# Patient Record
Sex: Male | Born: 1940 | Race: White | Hispanic: No | Marital: Married | Smoking: Former smoker
Health system: Southern US, Community
[De-identification: ages and names within clinical notes are randomized; demographics above are authoritative.]

## PROBLEM LIST (undated history)

## (undated) DIAGNOSIS — I4891 Unspecified atrial fibrillation: Secondary | ICD-10-CM

## (undated) DIAGNOSIS — E119 Type 2 diabetes mellitus without complications: Secondary | ICD-10-CM

## (undated) DIAGNOSIS — I10 Essential (primary) hypertension: Secondary | ICD-10-CM

## (undated) HISTORY — PX: JOINT REPLACEMENT: SHX530

## (undated) HISTORY — PX: SPINE SURGERY: SHX786

## (undated) HISTORY — PX: OTHER SURGICAL HISTORY: SHX169

## (undated) HISTORY — DX: Essential (primary) hypertension: I10

## (undated) HISTORY — DX: Type 2 diabetes mellitus without complications: E11.9

## (undated) HISTORY — PX: BACK SURGERY: SHX140

---

## 2005-12-05 ENCOUNTER — Ambulatory Visit: Payer: Self-pay | Admitting: Unknown Physician Specialty

## 2009-05-08 ENCOUNTER — Emergency Department: Payer: Self-pay | Admitting: Unknown Physician Specialty

## 2011-07-07 DIAGNOSIS — E119 Type 2 diabetes mellitus without complications: Secondary | ICD-10-CM | POA: Insufficient documentation

## 2012-09-03 ENCOUNTER — Encounter: Payer: Self-pay | Admitting: Internal Medicine

## 2012-09-24 ENCOUNTER — Encounter: Payer: Self-pay | Admitting: Orthopedic Surgery

## 2012-10-03 ENCOUNTER — Encounter: Payer: Self-pay | Admitting: Orthopedic Surgery

## 2012-10-03 ENCOUNTER — Encounter: Payer: Self-pay | Admitting: Internal Medicine

## 2012-10-05 DIAGNOSIS — L0390 Cellulitis, unspecified: Secondary | ICD-10-CM | POA: Insufficient documentation

## 2013-10-29 ENCOUNTER — Ambulatory Visit: Payer: Self-pay | Admitting: Physician Assistant

## 2014-06-26 DIAGNOSIS — R600 Localized edema: Secondary | ICD-10-CM | POA: Insufficient documentation

## 2014-09-17 ENCOUNTER — Emergency Department: Payer: Self-pay | Admitting: Emergency Medicine

## 2015-07-20 ENCOUNTER — Other Ambulatory Visit: Payer: Self-pay | Admitting: Family Medicine

## 2015-07-20 DIAGNOSIS — M25561 Pain in right knee: Secondary | ICD-10-CM

## 2015-07-21 ENCOUNTER — Ambulatory Visit
Admission: RE | Admit: 2015-07-21 | Discharge: 2015-07-21 | Disposition: A | Discharge status: Home / Self Care | Payer: Medicare Other | Source: Ambulatory Visit | Attending: Family Medicine | Admitting: Family Medicine

## 2015-07-21 DIAGNOSIS — S72421A Displaced fracture of lateral condyle of right femur, initial encounter for closed fracture: Secondary | ICD-10-CM | POA: Diagnosis not present

## 2015-07-21 DIAGNOSIS — X58XXXA Exposure to other specified factors, initial encounter: Secondary | ICD-10-CM | POA: Insufficient documentation

## 2015-07-21 DIAGNOSIS — M25561 Pain in right knee: Secondary | ICD-10-CM

## 2015-07-24 ENCOUNTER — Ambulatory Visit: Payer: Medicare Other

## 2015-07-31 DIAGNOSIS — Z471 Aftercare following joint replacement surgery: Secondary | ICD-10-CM | POA: Insufficient documentation

## 2015-07-31 DIAGNOSIS — S72426A Nondisplaced fracture of lateral condyle of unspecified femur, initial encounter for closed fracture: Secondary | ICD-10-CM | POA: Insufficient documentation

## 2015-09-09 DIAGNOSIS — M25561 Pain in right knee: Secondary | ICD-10-CM | POA: Insufficient documentation

## 2015-10-14 DIAGNOSIS — M1711 Unilateral primary osteoarthritis, right knee: Secondary | ICD-10-CM | POA: Insufficient documentation

## 2016-01-04 ENCOUNTER — Ambulatory Visit: Payer: Medicare Other | Attending: Orthopedic Surgery | Admitting: Physical Therapy

## 2016-01-04 DIAGNOSIS — R2689 Other abnormalities of gait and mobility: Secondary | ICD-10-CM | POA: Diagnosis present

## 2016-01-04 DIAGNOSIS — R269 Unspecified abnormalities of gait and mobility: Secondary | ICD-10-CM

## 2016-01-04 DIAGNOSIS — M6281 Muscle weakness (generalized): Secondary | ICD-10-CM | POA: Diagnosis present

## 2016-01-04 DIAGNOSIS — M25661 Stiffness of right knee, not elsewhere classified: Secondary | ICD-10-CM | POA: Insufficient documentation

## 2016-01-04 DIAGNOSIS — M25561 Pain in right knee: Secondary | ICD-10-CM | POA: Diagnosis present

## 2016-01-04 NOTE — Therapy (Signed)
[facility] [address] [place], [place], [address] Phone: [phone]   Fax:  [phone]  Physical Therapy Evaluation  Patient Details  Name: Ian King MRN: [id number] Date of Birth: 02/05/1941 Referring Provider: Dorothey BasemanBronstein, David  Encounter Date: 01/04/2016      PT End of Session - 01/04/16 1456    Visit Number 1   Number of Visits 8   Date for PT Re-Evaluation 02/01/16   Authorization - Visit Number 1   Authorization - Number of Visits 10   PT Start Time 1300   PT Stop Time 1409   PT Time Calculation (min) 69 min   Equipment Utilized During Treatment Gait belt   Activity Tolerance Patient tolerated treatment well;No increased pain   Behavior During Therapy [facility] for tasks assessed/performed      No past medical history on file.  No past surgical history on file.  There were no vitals filed for this visit.  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  Pt reports being placed on ABx early on after his surgery d/t a potential infection.  Pt stayed in a nursing home and received therapy at [facility] which included walking and exercising on a NuStep.  Pt believes he has no limitations with walking using a FWW.  Pt states that prior to his surgery he was ambulating with a combination of his FWW and a hurry cane.  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  Pt enjoys gardening, and mowing on a riding lawn mower in his freetime.  Pt's wife was present for evaluation and expressed concern about the swelling in his R LE.  Pt reports taking 2 tylenol prior to therapy.    Patient is accompained by: Family member   Limitations Standing;Walking;House hold activities;Other (comment)   Patient Stated Goals To regain  function of his R knee.    Currently in Pain? Yes   Pain Score 0-No pain   Pain Location Knee   Pain Orientation Right   Pain Type Surgical pain   Pain Onset 1 to 4 weeks ago            [facility] Assessment - 01/04/16 0001    Assessment   Medical Diagnosis R TKA   Referring Provider Dorothey BasemanBronstein, David   Onset Date/Surgical Date 12/03/15   Prior Therapy Yes   Balance Screen   Has the patient fallen in the past 6 months Yes   How many times? 1      OBJECTIVE: Ther Ex: Performed exercises within HEP including 2x30 sec standing step knee extension with overpressure, 1x15 standing B hip flexion, 8x quad sets with 10 second hold, 10x supine SLR, and 15-20x sit to/from stands.      Pt requires skilled PT services to increase knee ROM, improve knee strength, and improve balance.  Pt tolerated therapy treatment well as evidenced by participating actively throughout therapy and agreeing to complete his HEP.        PT Education - 01/04/16 1452    Education provided Yes   Education Details Educated on swelling, and not picking at his incision scabs.  Educated on HEP exercises, and increasing his knee extension at home.     Person(s)  Educated Patient;Spouse   Methods Explanation;Tactile cues;Demonstration;Verbal cues;Handout   Comprehension Verbalized understanding;Returned demonstration;Verbal cues required;Tactile cues required             PT Long Term Goals - 01/04/16 1511    PT LONG TERM GOAL #1   Title Pt will demonstrate R knee AROM at 5-120 deg. measured with goniometer to increase pt's ROM for function.    Baseline 19-113 on 01/04/2016   Time 4   Period Weeks   Status New   PT LONG TERM GOAL #2   Title Pt will demonstrate R hip/knee strength with MMT at 4+/5 to improve pt's LE strength.    Baseline Hip flex 4-/5, knee ext 3+/5, knee flex 4+/5 on 01/04/2016   Time 4   Period Weeks   Status New   PT LONG TERM GOAL #3   Title Pt will demonstrate 10 R SLR without rest  breaks with his leg maintaining a position of knee ext to improve pt's safety in preparation to increase ambulation with a hurry cane.    Baseline Pt is unable to complete SLR d/t 24 degree extension lag on 01/04/2016   Time 4   Period Weeks   Status New   PT LONG TERM GOAL #4   Title Pt will report 45/80 on LEFS self-percieved questionnaire to improve pt's quality of life.   Baseline 24/80 on 01/04/2016   Time 4   Period Weeks   Status New               Plan - 01/04/16 1457    Clinical Impression Statement Pt is a 75 y.o. male who presents to the clinic after a R TKA on 12/03/2015.  Pt ambulated into the clinic with wife using a FWW.  Pt demonstrates decreased R knee motion (Pre-treatment AROM: 20-113; pretreatment PROM 116 knee flex; post treatment PROM knee flex 116; post treatment AROM 19-113).  Pt presents with decreased B knee strength, with greater deficits on the R LE (R: hip flex 4-/5, knee ext 3+/5, knee flex 4+/5; L: hip flex 4/5, knee ext 4+/5, knee flex 4+/5).  Pt's seated AROM of R knee is 24-103 deg.  Pt has decreased patellar mobility in all directions on L knee with clicking, but pt does not c/o pain.  Pt has edema present in his R LE (R: at patella 18.5 in, 7 in superior to patella 21.5 in, 7 in inferior to patella 16 in; L: at patella 18 in, 7 in superior to patella 20 in, 7 in inferior to patella 14.5 in).  Pt demonstrates impaired balance with a positive modified rhomberg B.  Pt's scar presents with minimal redness in superior portion of the incision.  Pt. will benefit from skilled PT services to increase knee ROM/ strength to improve pain-free mobility without assistive device.     Pt will benefit from skilled therapeutic intervention in order to improve on the following deficits Abnormal gait;Decreased activity tolerance;Decreased balance;Decreased endurance;Decreased mobility;Decreased range of motion;Difficulty walking;Decreased strength;Decreased scar  mobility;Decreased skin integrity;Hypomobility;Increased edema;Impaired flexibility   Rehab Potential Good   Clinical Impairments Affecting Rehab Potential previous infection postopperatively   PT Frequency 4x / week   PT Duration 4 weeks   PT Treatment/Interventions Cryotherapy;Gait training;Neuromuscular re-education;Balance training;Therapeutic exercise;Therapeutic activities;Functional mobility training;Patient/family education;Manual techniques;Scar mobilization;Passive range of motion   PT Next Visit Plan Introduce manual therapy treatment to increase pt's knee extension, and begin strengthening exercises. Review HEP.   PT Home Exercise Plan See handout.    Consulted  and Agree with Plan of Care Patient          G-Codes - 01/28/16 02-26-2039    Functional Assessment Tool Used clinical impression/ LEFS/ pain/ ROM/ gait difficulty/ muscle weakness   Functional Limitation Mobility: Walking and moving around   Mobility: Walking and Moving Around Current Status [phone]) At least 40 percent but less than 60 percent impaired, limited or restricted   Mobility: Walking and Moving Around Goal Status [phone]) At least 1 percent but less than 20 percent impaired, limited or restricted       Problem List There are no active problems to display for this patient.  Cammie Mcgee, PT, DPT # [id number] [name], SPT  2016/01/28, 8:41 PM  [facility] [address] [place], [place], [address] Phone: [phone]   Fax:  [phone]  Name: Ian King MRN: [id number] Date of Birth: 05/11/1941

## 2016-01-07 ENCOUNTER — Ambulatory Visit: Payer: Medicare Other | Admitting: Physical Therapy

## 2016-01-07 ENCOUNTER — Encounter: Payer: Medicare Other | Admitting: Physical Therapy

## 2016-01-07 DIAGNOSIS — M25661 Stiffness of right knee, not elsewhere classified: Secondary | ICD-10-CM

## 2016-01-07 DIAGNOSIS — R269 Unspecified abnormalities of gait and mobility: Secondary | ICD-10-CM

## 2016-01-07 DIAGNOSIS — M6281 Muscle weakness (generalized): Secondary | ICD-10-CM

## 2016-01-07 DIAGNOSIS — M25561 Pain in right knee: Secondary | ICD-10-CM

## 2016-01-08 NOTE — Therapy (Signed)
[facility] [address] [place], [place], [address] Phone: [phone]   Fax:  [phone]  Physical Therapy Treatment  Patient Details  Name: Neomia GlassLarry L Warn MRN: [id number] Date of Birth: 03/26/1941 Referring Provider: Dorothey BasemanBronstein, David  Encounter Date: 01/07/2016      PT End of Session - 01/08/16 1848    Visit Number 2   Number of Visits 8   Date for PT Re-Evaluation 02/01/16   Authorization - Visit Number 2   Authorization - Number of Visits 10   PT Start Time 1317   PT Stop Time 1409   PT Time Calculation (min) 52 min   Activity Tolerance Patient limited by pain;Patient tolerated treatment well   Behavior During Therapy [facility] for tasks assessed/performed      No past medical history on file.  No past surgical history on file.  There were no vitals filed for this visit.      Subjective Assessment - 01/08/16 1846    Subjective Pt. states he was really sore/ hurting in R knee after doing prolonged knee extension stretch with wt. during intial PT visit.  Pt. states he as been unable to get comfortable/ sleep and has use polar ice machine frequently.     Patient is accompained by: Family member   Limitations Standing;Walking;House hold activities;Other (comment)   Patient Stated Goals To regain function of his R knee.    Currently in Pain? Yes   Pain Score 6    Pain Location Knee   Pain Orientation Right        OBJECTIVE:  There.ex.: Nustep L7 10 min. B UE/LE (focusing on increase knee extension for initial 8 min. And knee flexion for final 2 min.)- warm-up/no charge.  Standing/walking hip flexion with min. To no UE assist 10x in //-bars (mirror feedback).  Lateral walking with increase hip/knee flexion and cuing for upright posture 10x in //-bars.  TG knee flexion/ heel raises (slow controlled mvmt.) 30x each.  Reviewed HEP.  Manual tx.:  Scar massage (avoided proximal 1.5 inches of incision due to scabs present).   Educated wife/pt. On proper scar tx./ massage for home.  Supine/ seated R knee AA/PROM 10x each (pain tolerable, esp. With ext.).      Pt response for medical necessity:  Progressing with R knee ROM/ mod. Independence with gait and use of SPC with walking.  Pain limited knee extension and moderate cuing for posture correction.  Pt. Will continue to benefit from ROM/strength training to improve pain-free mobility.         PT Long Term Goals - 01/04/16 1511    PT LONG TERM GOAL #1   Title Pt will demonstrate R knee AROM at 5-120 deg. measured with goniometer to increase pt's ROM for function.    Baseline 19-113 on 01/04/2016   Time 4   Period Weeks   Status New   PT LONG TERM GOAL #2   Title Pt will demonstrate R hip/knee strength with MMT at 4+/5 to improve pt's LE strength.    Baseline Hip flex 4-/5, knee ext 3+/5, knee flex 4+/5 on 01/04/2016   Time 4   Period Weeks   Status New   PT LONG TERM GOAL #3   Title Pt will demonstrate 10 R SLR without rest breaks with his leg maintaining a position of knee ext to improve pt's safety in preparation to increase ambulation with a hurry cane.    Baseline Pt is unable to complete  SLR d/t 24 degree extension lag on 01/04/2016   Time 4   Period Weeks   Status New   PT LONG TERM GOAL #4   Title Pt will report 45/80 on LEFS self-percieved questionnaire to improve pt's quality of life.   Baseline 24/80 on 01/04/2016   Time 4   Period Weeks   Status New            Plan - 01/08/16 1849    Clinical Impression Statement Pt. primary limitation remains R knee extension.  Pt. lacking 20 deg. AROM after manual stretches.  Pt. doing well with R knee flexion AROM to 117 deg. after stretching/ scar massage.  Pts. scar is healing well with scabs noted at proximal aspects of incision.  Pt. ambulates with 2-point gait pattern with use of SPC inside/ outside but requires cuing to correct forward/ slouched posture and head position.     Rehab Potential  Good   Clinical Impairments Affecting Rehab Potential previous infection postopperatively   PT Frequency 2x / week   PT Duration 4 weeks   PT Treatment/Interventions Cryotherapy;Gait training;Neuromuscular re-education;Balance training;Therapeutic exercise;Therapeutic activities;Functional mobility training;Patient/family education;Manual techniques;Scar mobilization;Passive range of motion   PT Next Visit Plan Progress R knee ext./ HEP as tolerated.     PT Home Exercise Plan See handout.    Consulted and Agree with Plan of Care Patient      Patient will benefit from skilled therapeutic intervention in order to improve the following deficits and impairments:  Abnormal gait, Decreased activity tolerance, Decreased balance, Decreased endurance, Decreased mobility, Decreased range of motion, Difficulty walking, Decreased strength, Decreased scar mobility, Decreased skin integrity, Hypomobility, Increased edema, Impaired flexibility  Visit Diagnosis: Joint stiffness of knee, right  Gait difficulty  Pain in right knee  Muscle weakness (generalized)     Problem List There are no active problems to display for this patient.  Cammie Mcgee, PT, DPT # [phone]   01/08/2016, 6:53 PM  [place] [facility] [address] [place], [place], [address] Phone: [phone]   Fax:  [phone]  Name: MARSALIS BEAULIEU MRN: [id number] Date of Birth: [birth year]

## 2016-01-11 ENCOUNTER — Ambulatory Visit: Payer: Medicare Other

## 2016-01-11 DIAGNOSIS — M25661 Stiffness of right knee, not elsewhere classified: Secondary | ICD-10-CM

## 2016-01-11 DIAGNOSIS — M6281 Muscle weakness (generalized): Secondary | ICD-10-CM

## 2016-01-11 DIAGNOSIS — M25561 Pain in right knee: Secondary | ICD-10-CM

## 2016-01-11 DIAGNOSIS — R269 Unspecified abnormalities of gait and mobility: Secondary | ICD-10-CM

## 2016-01-12 NOTE — Therapy (Signed)
[facility] [address] [place], [place], [address] Phone: [phone]   Fax:  [phone]  Physical Therapy Treatment  Patient Details  Name: Ian King MRN: [id number] Date of Birth: [birth year] Referring Provider: Dorothey Baseman  Encounter Date: 01/11/2016      PT End of Session - 01/12/16 0755    Visit Number 3   Number of Visits 8   Date for PT Re-Evaluation 02/01/16   Authorization - Visit Number 3   Authorization - Number of Visits 10   PT Start Time 1514   PT Stop Time 1601   PT Time Calculation (min) 47 min   Equipment Utilized During Treatment Gait belt   Activity Tolerance Patient limited by pain;Patient tolerated treatment well   Behavior During Therapy [facility] for tasks assessed/performed      History reviewed. No pertinent past medical history.  History reviewed. No pertinent past surgical history.  There were no vitals filed for this visit.      Subjective Assessment - 01/12/16 0752    Subjective Pt reports that he is well today, but has not been pushing his knee straight very much because the sensation is uncomfortable, but has been using his polar ice machine.  Pt and pt's wife inquired about a potential stitch/staple that is in pt's scar.    Patient is accompained by: Family member   Limitations Standing;Walking;House hold activities;Other (comment)   Patient Stated Goals To regain function of his R knee.    Currently in Pain? Yes   Pain Score 3    Pain Location Knee   Pain Orientation Right   Pain Type Surgical pain   Pain Onset More than a month ago      OBJECTIVE: TherEx: NuStep at level 7 for 10 minutes.  20-30x total gym squats, with v.c. To sit further into a squat position and extend his knees all the way when coming out of a squat.  6x10 feet parallel bar hip abd walk with red band with v.c. And visual cues To keep toes pointed forward.  2x20 R TKE with yellow band.  1x30 sec self knee ext  stretch on stair.  Manual Therapy: 5x1 minute manual ext stretch with SPT over presure and pt positioned in supine.  8 minutesx scar massage to inferior portion of pt's scar with The Extractor small cup.    Pt requires skilled PT services to increase R knee ROM, increase gross LE strength, and improve pt's function.  Pt tolerated treatment well today as evidenced by increasing the use of his SPC and demonstrating improvements in R knee ext AROM.        PT Education - 01/12/16 0755    Education provided Yes   Education Details Pt educated on placing a phonebook over his knee while icing to help increase pt's knee extension.    Person(s) Educated Patient;Spouse   Methods Explanation   Comprehension Verbalized understanding             PT Long Term Goals - 01/04/16 1511    PT LONG TERM GOAL #1   Title Pt will demonstrate R knee AROM at 5-120 deg. measured with goniometer to increase pt's ROM for function.    Baseline 19-113 on 01/04/2016   Time 4   Period Weeks   Status New   PT LONG TERM GOAL #2   Title Pt will demonstrate R hip/knee strength with MMT at 4+/5 to improve pt's LE strength.  Baseline Hip flex 4-/5, knee ext 3+/5, knee flex 4+/5 on 01/04/2016   Time 4   Period Weeks   Status New   PT LONG TERM GOAL #3   Title Pt will demonstrate 10 R SLR without rest breaks with his leg maintaining a position of knee ext to improve pt's safety in preparation to increase ambulation with a hurry cane.    Baseline Pt is unable to complete SLR d/t 24 degree extension lag on 01/04/2016   Time 4   Period Weeks   Status New   PT LONG TERM GOAL #4   Title Pt will report 45/80 on LEFS self-percieved questionnaire to improve pt's quality of life.   Baseline 24/80 on 01/04/2016   Time 4   Period Weeks   Status New               Plan - 01/12/16 0801    Clinical Impression Statement Pt demonstrates improvements in AROM R knee ext, but still lacks 18 degrees (lacking 13 degrees  with therapist overpressure).  Pt's R knee flex AROM 112 in a supine position.  Pt's scar continues to heal  appropriately but there is an stitch or a staple in the superior portion of the scar, pt was advised to ask his physician.     Rehab Potential Good   Clinical Impairments Affecting Rehab Potential previous infection postopperatively   PT Frequency 2x / week   PT Duration 4 weeks   PT Treatment/Interventions Cryotherapy;Gait training;Neuromuscular re-education;Balance training;Therapeutic exercise;Therapeutic activities;Functional mobility training;Patient/family education;Manual techniques;Scar mobilization;Passive range of motion      Patient will benefit from skilled therapeutic intervention in order to improve the following deficits and impairments:  Abnormal gait, Decreased activity tolerance, Decreased balance, Decreased endurance, Decreased mobility, Decreased range of motion, Difficulty walking, Decreased strength, Decreased scar mobility, Decreased skin integrity, Hypomobility, Increased edema, Impaired flexibility  Visit Diagnosis: Stiffness of right knee, not elsewhere classified  Gait difficulty  Pain in right knee  Muscle weakness (generalized)     Problem List There are no active problems to display for this patient.  This entire session was performed under direct supervision and direction of a licensed therapist/therapist assistant . I have personally read, edited and approve of the note as written.   Lissa HoardKristy Salvador [place] SPT Lynnea MaizesJason D Huprich PT, DPT   Huprich,Jason, DPT 01/12/2016, 3:05 PM  [place] [facility] [address] [place], [place], [address] Phone: [phone]   Fax:  [phone]  Name: Neomia GlassLarry L Toback MRN: [id number] Date of Birth: 09/21/1941

## 2016-01-14 ENCOUNTER — Ambulatory Visit: Payer: Medicare Other

## 2016-01-14 ENCOUNTER — Encounter: Payer: Self-pay | Admitting: Physical Therapy

## 2016-01-14 DIAGNOSIS — M25661 Stiffness of right knee, not elsewhere classified: Secondary | ICD-10-CM

## 2016-01-14 DIAGNOSIS — M6281 Muscle weakness (generalized): Secondary | ICD-10-CM

## 2016-01-14 DIAGNOSIS — R2689 Other abnormalities of gait and mobility: Secondary | ICD-10-CM

## 2016-01-14 NOTE — Therapy (Signed)
[place] [facility] [address] [place], [place], [address] Phone: [phone]   Fax:  [phone]  Physical Therapy Treatment  Patient Details  Name: Ian King MRN: [id number] Date of Birth: [birth year] Referring Provider: Dorothey Baseman  Encounter Date: 01/14/2016      PT End of Session - 01/14/16 1223    Visit Number 4   Number of Visits 8   Date for PT Re-Evaluation 02/11/16   Authorization - Visit Number 4   Authorization - Number of Visits 10   PT Start Time 1116   PT Stop Time 1208   PT Time Calculation (min) 52 min   Equipment Utilized During Treatment Gait belt   Activity Tolerance Patient tolerated treatment well   Behavior During Therapy [facility] for tasks assessed/performed      History reviewed. No pertinent past medical history.  History reviewed. No pertinent past surgical history.  There were no vitals filed for this visit.      Subjective Assessment - 01/14/16 1122    Subjective Pt states that he is well today, and reports compliance with HEP but is unable to state what exercises he is performing.  Pt reports that he is icing his knee in extension but is not compliant with placing weight on his knee for increased extension force.  Pt inquired to his physician about the stitch/staple and reports that it will come out on its own per physicians orders.     Patient is accompained by: Family member   Limitations Standing;Walking;House hold activities;Other (comment)   Patient Stated Goals To regain function of his R knee.    Currently in Pain? Yes   Pain Score 3    Pain Location Knee   Pain Orientation Right   Pain Type Surgical pain   Pain Onset More than a month ago     OBJECTIVE: TherEx: R sidelying 2x15-25 clams with SPT resistance and alligators, requiring v.c. To keep his hips rolled forward and perform each repetition slowly.  30x alternating LE kick outs from hooklying position, required v.c. And tactile  cues to keep his thighs level.  30x forward step ups at stairs with v.c. To perform each exercise in a slow and controlled manner.  Manual therapy: 6 minutes scar massage with small and large cup to middle/inferior scar with The Extractor.  4x57min manual knee ext with SPT overpressure.  Pt educated on the importance of continuing to use an AD with gait d/t his impaired balance.     Pt requires skilled PT services to increase R LE strength, increase R knee ROM, and improve static/dynamic balance.  Pt tolerated treatment well today as evidenced by his continued improvements in AROM, but SPT is concerned that pt is not compliant with his HEP at home.        PT Education - 01/14/16 1206    Education provided Yes   Education Details Pt educated on the importance of gaining AROM knee ext for function, and that he needs to continue to use his [facility] for ambulation.    Person(s) Educated Patient;Spouse   Methods Explanation   Comprehension Verbalized understanding             PT Long Term Goals - 01/15/16 1157    PT LONG TERM GOAL #1   Title Pt will demonstrate R knee AROM at 5-120 deg. measured with goniometer to increase pt's ROM for function.    Baseline 19-113 on 01/04/2016   Time 4  Period Weeks   Status On-going   PT LONG TERM GOAL #2   Title Pt will demonstrate R hip/knee strength with MMT at 4+/5 to improve pt's LE strength.    Baseline Hip flex 4-/5, knee ext 3+/5, knee flex 4+/5 on 01/04/2016   Time 4   Period Weeks   Status On-going   PT LONG TERM GOAL #3   Title Pt will demonstrate 10 R SLR without rest breaks with his leg maintaining a position of knee ext to improve pt's safety in preparation to increase ambulation with a hurry cane.    Baseline Pt is unable to complete SLR d/t 24 degree extension lag on 01/04/2016   Time 4   Period Weeks   Status On-going   PT LONG TERM GOAL #4   Title Pt will report 45/80 on LEFS self-percieved questionnaire to improve pt's quality of  life.   Baseline 24/80 on 01/04/2016   Time 4   Period Weeks   Status On-going               Plan - 01/14/16 1224    Clinical Impression Statement Pt continues to demonstrate improvements in AROM of his R knee (AROM 17-113, lacking 7 degrees with PT overpressure).  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  Pt is able to walk with a normal gait without AD in a closed environment, but SPT is not confident about pt's ability to catch himself if he was to have a LOB while ambulating without AD.     Rehab Potential Good   Clinical Impairments Affecting Rehab Potential previous infection postopperatively   PT Frequency 2x / week   PT Duration 4 weeks   PT Treatment/Interventions Cryotherapy;Gait training;Neuromuscular re-education;Balance training;Therapeutic exercise;Therapeutic activities;Functional mobility training;Patient/family education;Manual techniques;Scar mobilization;Passive range of motion   PT Next Visit Plan Continue with strengthing exercises and manual overpressire into extension.    PT Home Exercise Plan See handout.    Consulted and Agree with Plan of Care Patient      Patient will benefit from skilled therapeutic intervention in order to improve the following deficits and impairments:  Abnormal gait, Decreased activity tolerance, Decreased balance, Decreased endurance, Decreased mobility, Decreased range of motion, Difficulty walking, Decreased strength, Decreased scar mobility, Decreased skin integrity, Hypomobility, Increased edema, Impaired flexibility  Visit Diagnosis: Stiffness of right knee, not elsewhere classified - Plan: PT plan of care cert/re-cert  Muscle weakness (generalized) - Plan: PT plan of care cert/re-cert  Other abnormalities of gait and mobility - Plan: PT plan of care  cert/re-cert     Problem List There are no active problems to display for this patient.  This entire session was performed under direct supervision and direction of a licensed therapist/therapist assistant . I have personally read, edited and approve of the note as written.   Lissa HoardKristy Zach Tietje SPT Lynnea MaizesJason D Huprich PT, DPT   Huprich,Jason, DPT 01/15/2016, 11:58 AM  [place] [facility] [address] [place], [place], [address] Phone: [phone]   Fax:  [phone]  Name: Ian GlassLarry L King MRN: [id number] Date of Birth: 06/03/1941

## 2016-01-18 ENCOUNTER — Ambulatory Visit: Payer: Medicare Other | Admitting: Physical Therapy

## 2016-01-18 DIAGNOSIS — M25661 Stiffness of right knee, not elsewhere classified: Secondary | ICD-10-CM

## 2016-01-18 DIAGNOSIS — M25561 Pain in right knee: Secondary | ICD-10-CM

## 2016-01-18 DIAGNOSIS — R269 Unspecified abnormalities of gait and mobility: Secondary | ICD-10-CM

## 2016-01-18 DIAGNOSIS — M6281 Muscle weakness (generalized): Secondary | ICD-10-CM

## 2016-01-19 NOTE — Therapy (Signed)
[place] [facility] [address] [place], [place], [address] Phone: [phone]   Fax:  [phone]  Physical Therapy Treatment  Patient Details  Name: Ian King MRN: [id number] Date of Birth: 05/25/1941 Referring Provider: Dorothey BasemanBronstein, David  Encounter Date: 01/18/2016      PT End of Session - 01/18/16 1154    Visit Number 5   Number of Visits 8   Date for PT Re-Evaluation 02/11/16   Authorization - Visit Number 5   Authorization - Number of Visits 10   PT Start Time 1109   PT Stop Time 1211   PT Time Calculation (min) 62 min   Equipment Utilized During Treatment Gait belt   Activity Tolerance Patient tolerated treatment well;No increased pain   Behavior During Therapy [facility] for tasks assessed/performed      No past medical history on file.  No past surgical history on file.  There were no vitals filed for this visit.      Subjective Assessment - 01/18/16 1153    Subjective Pt. reports no pain prior to tx. session.  Pt. states he has been able to sleep in bed (not recliner) the past few nights with no issues.  Pt. reports using a large cookbook at home to assist in R knee extension/ static stretches.  Pt. entered PT with use of Hurrycane and 2-point gait pattern.     Patient is accompained by: Family member   Limitations Standing;Walking;House hold activities;Other (comment)   Patient Stated Goals To regain function of his R knee.    Currently in Pain? No/denies      OBJECTIVE: Gait training: 8 minutes ambulation outside of clinic on uneven surfaces with CGAx1 without AD.  Minimal verbal cuing required with proper technique.  Consistent step pattern with hip flexion/ heel strike.  TherEx: 2x3 laps around gym FWD and BKWD stool scoots, pulling SPT with BKWDs stool scoots; required v.c. To pick up his feet and not drag them along.  2x3x10-12 (1 round each LE) standing hip abd with yellow band.  20x TKE with red theraband with v.c.  To extend his knee all the way back.  20x seated hamstring curls with red theraband.  Nustep L7 12 min. (no charge).  Manual tx.: supine R knee flexion/ ext. AA/PROM 5x each.  Scar massage with discussion of proximal scabs present.  Scabs are loose and ready to fall off with redness noted.  Patellar mobs. All planes.     Pt requires skilled PT services to increase gross R LE strength, improve scar mobility, and increase functional ROM.  Pt tolerated treatment well today as evidenced by demonstrating increased ROM into knee ext that is within 1 degree of his L LE.         PT Education - 01/19/16 0656    Education provided Yes   Education Details Pt educated on new strengthening exercises within HEP in cluding standing hip abd, TKE, and hamstring curls.     Person(s) Educated Patient   Methods Explanation;Demonstration;Verbal cues;Handout   Comprehension Verbalized understanding;Returned demonstration;Verbal cues required             PT Long Term Goals - 01/15/16 1157    PT LONG TERM GOAL #1   Title Pt will demonstrate R knee AROM at 5-120 deg. measured with goniometer to increase pt's ROM for function.    Baseline 19-113 on 01/04/2016   Time 4   Period Weeks   Status On-going   PT LONG  TERM GOAL #2   Title Pt will demonstrate R hip/knee strength with MMT at 4+/5 to improve pt's LE strength.    Baseline Hip flex 4-/5, knee ext 3+/5, knee flex 4+/5 on 01/04/2016   Time 4   Period Weeks   Status On-going   PT LONG TERM GOAL #3   Title Pt will demonstrate 10 R SLR without rest breaks with his leg maintaining a position of knee ext to improve pt's safety in preparation to increase ambulation with a hurry cane.    Baseline Pt is unable to complete SLR d/t 24 degree extension lag on 01/04/2016   Time 4   Period Weeks   Status On-going   PT LONG TERM GOAL #4   Title Pt will report 45/80 on LEFS self-percieved questionnaire to improve pt's quality of life.   Baseline 24/80 on  01/04/2016   Time 4   Period Weeks   Status On-going               Plan - 01/19/16 1610    Clinical Impression Statement Pt presents with improved AROM R knee ext that almost equals his L knee (R: lacking 10, L: lacking 9).  Pt steady gait when ambulating over uneven outdoor surfaces without an AD.    Rehab Potential Good   Clinical Impairments Affecting Rehab Potential previous infection postopperatively   PT Frequency 2x / week   PT Duration 4 weeks   PT Treatment/Interventions Cryotherapy;Gait training;Neuromuscular re-education;Balance training;Therapeutic exercise;Therapeutic activities;Functional mobility training;Patient/family education;Manual techniques;Scar mobilization;Passive range of motion   PT Next Visit Plan Continue to introduce strengthening exercises and functional activities.    PT Home Exercise Plan See handout.    Consulted and Agree with Plan of Care Patient      Patient will benefit from skilled therapeutic intervention in order to improve the following deficits and impairments:  Abnormal gait, Decreased activity tolerance, Decreased balance, Decreased endurance, Decreased mobility, Decreased range of motion, Difficulty walking, Decreased strength, Decreased scar mobility, Decreased skin integrity, Hypomobility, Increased edema, Impaired flexibility  Visit Diagnosis: Muscle weakness (generalized)  Gait difficulty  Pain in right knee  Stiffness of right knee, not elsewhere classified  Joint stiffness of knee, right     Problem List There are no active problems to display for this patient.  Cammie Mcgee, PT, DPT # [id number]  [name], SPT 01/19/2016, 7:03 AM  [place] [facility] [address] [place], [place], [address] Phone: [phone]   Fax:  [phone]  Name: Ian King MRN: [id number] Date of Birth: 1941-03-09

## 2016-01-21 ENCOUNTER — Ambulatory Visit: Payer: Medicare Other | Admitting: Physical Therapy

## 2016-01-21 DIAGNOSIS — M25661 Stiffness of right knee, not elsewhere classified: Secondary | ICD-10-CM | POA: Diagnosis not present

## 2016-01-21 DIAGNOSIS — M6281 Muscle weakness (generalized): Secondary | ICD-10-CM

## 2016-01-21 DIAGNOSIS — M25561 Pain in right knee: Secondary | ICD-10-CM

## 2016-01-21 DIAGNOSIS — R269 Unspecified abnormalities of gait and mobility: Secondary | ICD-10-CM

## 2016-01-22 NOTE — Therapy (Signed)
[facility] [address] [place], [place], [address] Phone: [phone]   Fax:  [phone]  Physical Therapy Treatment  Patient Details  Name: Ian King MRN: [id number] Date of Birth: [birth year] Referring Provider: Dorothey Baseman  Encounter Date: 01/21/2016      PT End of Session - 01/22/16 1759    Visit Number 6   Number of Visits 8   Date for PT Re-Evaluation 02/11/16   Authorization - Visit Number 6   Authorization - Number of Visits 10   PT Start Time 1107   PT Stop Time 1201   PT Time Calculation (min) 54 min   Activity Tolerance Patient tolerated treatment well;No increased pain   Behavior During Therapy [facility] for tasks assessed/performed      No past medical history on file.  No past surgical history on file.  There were no vitals filed for this visit.      Subjective Assessment - 01/22/16 1745    Subjective Pt. states he is getting better and staying active.  No c/o pain at this time but still ambulates with forward posture/ hurrycane for safety.     Patient is accompained by: Family member   Limitations Standing;Walking;House hold activities;Other (comment)   Patient Stated Goals To regain function of his R knee.    Currently in Pain? No/denies       OBJECTIVE:  TherEx:  Nustep L7 12 min. (no charge).  Walking in clinic/ recip step training with use of 1 handrail/ SPC (no pain/ good technique/ poor posture).  TG knee flexion/ heel raises 30x each. Supine R hip/knee manual isometrics 5x (mod-max resistance). Standing hip ex. (flexion/ ext./ knee flexion)- 20x each.  BOSU partial lunges/ step ups in //-bars with UE assist as needed 10x2. Each.  Manual tx.: supine R knee flexion/ ext. AA/PROM 5x each. Scar massage with focus on prox. Aspect of incision. Patellar mobs. All planes.   Pt requires skilled PT services to increase gross R LE strength, improve scar mobility, and increase functional  ROM. Pt tolerated treatment well today as evidenced by demonstrating increased ROM into knee ext. And progressing with LE strengthening ex. Program to improve daily mobility/ stairs.        PT Long Term Goals - 01/15/16 1157    PT LONG TERM GOAL #1   Title Pt will demonstrate R knee AROM at 5-120 deg. measured with goniometer to increase pt's ROM for function.    Baseline 19-113 on 01/04/2016   Time 4   Period Weeks   Status On-going   PT LONG TERM GOAL #2   Title Pt will demonstrate R hip/knee strength with MMT at 4+/5 to improve pt's LE strength.    Baseline Hip flex 4-/5, knee ext 3+/5, knee flex 4+/5 on 01/04/2016   Time 4   Period Weeks   Status On-going   PT LONG TERM GOAL #3   Title Pt will demonstrate 10 R SLR without rest breaks with his leg maintaining a position of knee ext to improve pt's safety in preparation to increase ambulation with a hurry cane.    Baseline Pt is unable to complete SLR d/t 24 degree extension lag on 01/04/2016   Time 4   Period Weeks   Status On-going   PT LONG TERM GOAL #4   Title Pt will report 45/80 on LEFS self-percieved questionnaire to improve pt's quality of life.   Baseline 24/80 on 01/04/2016   Time 4  Period Weeks   Status On-going            Plan - 01/22/16 1800    Clinical Impression Statement R knee AROM (-9 to 118 deg.) after manual stretches/ STM.  Pt. continues to benefit from use of hurrycane with walking, esp. on outside terrain/ curbs.  Pt. demonstrates proper technique/ safety with ascend/ descending stairs with recip. pattern and 1 handrail/ cane.  Good scar healing at proximal incision as compared to previous tx. session with scabs present.  Pt. will continue to benefit from skilled PT services to maximize knee ext./ strengthening.    Rehab Potential Good   Clinical Impairments Affecting Rehab Potential previous infection postopperatively   PT Frequency 2x / week   PT Duration 4 weeks   PT Treatment/Interventions  Cryotherapy;Gait training;Neuromuscular re-education;Balance training;Therapeutic exercise;Therapeutic activities;Functional mobility training;Patient/family education;Manual techniques;Scar mobilization;Passive range of motion   PT Next Visit Plan Continue to introduce strengthening exercises and functional activities.    PT Home Exercise Plan See handout.    Consulted and Agree with Plan of Care Patient      Patient will benefit from skilled therapeutic intervention in order to improve the following deficits and impairments:  Abnormal gait, Decreased activity tolerance, Decreased balance, Decreased endurance, Decreased mobility, Decreased range of motion, Difficulty walking, Decreased strength, Decreased scar mobility, Decreased skin integrity, Hypomobility, Increased edema, Impaired flexibility  Visit Diagnosis: Muscle weakness (generalized)  Gait difficulty  Pain in right knee  Stiffness of right knee, not elsewhere classified     Problem List There are no active problems to display for this patient.  Cammie McgeeMichael C Ramir Malerba, PT, DPT # [phone]   01/22/2016, 6:06 PM  [place] [facility] [address] [place], [place], [address] Phone: [phone]   Fax:  [phone]  Name: Neomia GlassLarry L [place] MRN: [id number] Date of Birth: 08/21/1941

## 2016-01-25 ENCOUNTER — Ambulatory Visit: Payer: Medicare Other | Admitting: Physical Therapy

## 2016-01-25 ENCOUNTER — Encounter: Payer: Self-pay | Admitting: Physical Therapy

## 2016-01-25 DIAGNOSIS — M6281 Muscle weakness (generalized): Secondary | ICD-10-CM

## 2016-01-25 DIAGNOSIS — M25661 Stiffness of right knee, not elsewhere classified: Secondary | ICD-10-CM | POA: Diagnosis not present

## 2016-01-25 DIAGNOSIS — R2689 Other abnormalities of gait and mobility: Secondary | ICD-10-CM

## 2016-01-25 NOTE — Therapy (Signed)
[place] [facility] [address] [place], [place], [address] Phone: [phone]   Fax:  [phone]  Physical Therapy Treatment  Patient Details  Name: Ian GlassLarry L Dorough MRN: [id number] Date of Birth: 01/01/1941 Referring Provider: Dorothey BasemanBronstein, David  Encounter Date: 01/25/2016      PT End of Session - 01/25/16 1209    Visit Number 7   Number of Visits 8   Date for PT Re-Evaluation 02/11/16   Authorization - Visit Number 7   Authorization - Number of Visits 10   PT Start Time 1111   PT Stop Time 1205   PT Time Calculation (min) 54 min   Equipment Utilized During Treatment Gait belt   Activity Tolerance Patient tolerated treatment well;No increased pain   Behavior During Therapy [facility] for tasks assessed/performed      History reviewed. No pertinent past medical history.  History reviewed. No pertinent past surgical history.  There were no vitals filed for this visit.      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  Pt's wife obtained a photo of the pt's knee and sent it to his physician.  Pt c/o increased warmth in his knee.  Pt states that he was ambulating without his SPC in his yard to feel the birds over the weekend.     Patient is accompained by: Family member   Limitations Standing;Walking;House hold activities;Other (comment)   Patient Stated Goals To regain function of his R knee.    Currently in Pain? No/denies   Pain Score 0-No pain      OBJECTIVE: Manual therapy: 6 min scar massage to superior 2/3 of scar with SPT wearing gloves sanitized with Purell to minimize any risk of infection.  3x30-45 sec B thomas stretch and quadriceps stretch, both with SPT over pressure to intensify stretch.  TherEx: 10 min SCIFIT at level 6.5 resistance (5 min FWD/ 5 min BKWD).  40x alternating lunges in parallel  bars on bosu ball with v.c. To minimize hand usage.  3x10-12 R LE single leg squat on total gym with pillow case under L LE to assist in keeping L LE up.  30x B hooklying clamshells with red theraband with v.c. To activate gluteal muscles.    Pt requires skilled PT services to increase R LE strength, improve B hip mobility, and improve safety with gait without AD.  Pt tolerated rx well today as evidenced by demonstrating good R quad and gluteal activation.         PT Education - 01/25/16 1208    Education provided Yes   Education Details Pt educated to avoid direct contact with his potentially infected superior incision, and to keep working to strengthen his knee to see further results.  Educated on new hip flexor and quad stretch.    Person(s) Educated Patient;Spouse   Methods Explanation;Demonstration;Handout   Comprehension Verbalized understanding;Returned demonstration             PT Long Term Goals - 01/15/16 1157    PT LONG TERM GOAL #1   Title Pt will demonstrate R knee AROM at 5-120 deg. measured with goniometer to increase pt's ROM for function.    Baseline 19-113 on 01/04/2016   Time 4   Period Weeks   Status On-going   PT LONG TERM GOAL #2   Title Pt will  demonstrate R hip/knee strength with MMT at 4+/5 to improve pt's LE strength.    Baseline Hip flex 4-/5, knee ext 3+/5, knee flex 4+/5 on 01/04/2016   Time 4   Period Weeks   Status On-going   PT LONG TERM GOAL #3   Title Pt will demonstrate 10 R SLR without rest breaks with his leg maintaining a position of knee ext to improve pt's safety in preparation to increase ambulation with a hurry cane.    Baseline Pt is unable to complete SLR d/t 24 degree extension lag on 01/04/2016   Time 4   Period Weeks   Status On-going   PT LONG TERM GOAL #4   Title Pt will report 45/80 on LEFS self-percieved questionnaire to improve pt's quality of life.   Baseline 24/80 on 01/04/2016   Time 4   Period Weeks   Status  On-going            Plan - 01/25/16 1129    Clinical Impression Statement Pt's R knee demonstrates increased warmth around the incision, and minimal swelling.  Pt has a yellow scab that has formed in the superior portion of this scar, but there is no purulent drainage noticable.  Pt ambulates with his SPC but is noticed to primarily carry it and not utilize it for his intended purpose.  Pt was able to ambulate without AD for duration of session b/w exercises with CGAx1 and no LOB.  Pt has tight B hip flexors and quadriceps MM, which are contributing to his forward flexed posture.  Pt continues to demonstrate weakness in his R quadricep and glut musculature.    Rehab Potential Good   Clinical Impairments Affecting Rehab Potential previous infection postopperatively   PT Frequency 2x / week   PT Duration 4 weeks   PT Treatment/Interventions Cryotherapy;Gait training;Neuromuscular re-education;Balance training;Therapeutic exercise;Therapeutic activities;Functional mobility training;Patient/family education;Manual techniques;Scar mobilization;Passive range of motion   PT Next Visit Plan Continue to introduce strengthening exercises and functional activities.  Assess stretching HEP.    PT Home Exercise Plan See handout.    Consulted and Agree with Plan of Care Patient      Patient will benefit from skilled therapeutic intervention in order to improve the following deficits and impairments:  Abnormal gait, Decreased activity tolerance, Decreased balance, Decreased endurance, Decreased mobility, Decreased range of motion, Difficulty walking, Decreased strength, Decreased scar mobility, Decreased skin integrity, Hypomobility, Increased edema, Impaired flexibility  Visit Diagnosis: Muscle weakness (generalized)  Other abnormalities of gait and mobility  Joint stiffness of knee, right     Problem List There are no active problems to display for this patient.  Ian King, PT, DPT #  [id number] Ian King, SPT  01/25/2016, 12:13 PM  [place] [facility] [address] [place], [place], [address] Phone: [phone]   Fax:  [phone]  Name: JERMALE CRASS MRN: [id number] Date of Birth: [birth year]

## 2016-01-28 ENCOUNTER — Ambulatory Visit: Payer: Medicare Other | Admitting: Physical Therapy

## 2016-01-28 DIAGNOSIS — R2689 Other abnormalities of gait and mobility: Secondary | ICD-10-CM

## 2016-01-28 DIAGNOSIS — M25561 Pain in right knee: Secondary | ICD-10-CM

## 2016-01-28 DIAGNOSIS — M25661 Stiffness of right knee, not elsewhere classified: Secondary | ICD-10-CM | POA: Diagnosis not present

## 2016-01-28 DIAGNOSIS — R269 Unspecified abnormalities of gait and mobility: Secondary | ICD-10-CM

## 2016-01-28 DIAGNOSIS — M6281 Muscle weakness (generalized): Secondary | ICD-10-CM

## 2016-01-29 NOTE — Therapy (Signed)
[place] [facility] [address] [place], [place], [address] Phone: [phone]   Fax:  [phone]  Physical Therapy Treatment  Patient Details  Name: Ian King MRN: [id number] Date of Birth: 10-02-1941 Referring Provider: Juluis Pitch  Encounter Date: 01/28/2016      PT End of Session - 01/29/16 1636    Visit Number 8   Number of Visits 8   Date for PT Re-Evaluation 02/11/16   Authorization - Visit Number 8   Authorization - Number of Visits 10   PT Start Time 8756   PT Stop Time 4332   PT Time Calculation (min) 50 min   Activity Tolerance Patient tolerated treatment well;No increased pain   Behavior During Therapy [facility] for tasks assessed/performed      No past medical history on file.  No past surgical history on file.  There were no vitals filed for this visit.      Subjective Assessment - 01/29/16 1635    Subjective Pt. reports no new complaints and states he is ready for discharge as discussed with PT.  Pt. planning on continuing with walking/HEP and instructed to contact PT if any issues persist.     Patient is accompained by: Family member   Limitations Standing;Walking;House hold activities;Other (comment)   Patient Stated Goals To regain function of his R knee.    Currently in Pain? No/denies      OBJECTIVE: Manual therapy: reassessed knee ROM (-9 to 118 deg.).  TherEx: 10 min SCIFIT at level 6.5 resistance (5 min FWD/ 5 min BKWD). 40x alternating lunges in parallel bars on bosu ball with v.c. To minimize hand usage. 3x10-12 R LE single leg squat on total gym with pillow case under L LE to assist in keeping L LE up. 30x B hooklying clamshells with red theraband with v.c. To activate gluteal muscles.  Nautilus walk outs: 60# lateral and 110# forward/backwards 10x each. Reviewed entire HEP in depth with progression.   Pt requires skilled PT services to increase R LE strength, improve B hip mobility,  and improve safety with gait without AD. Pt tolerated rx well today as evidenced by demonstrating good R quad and gluteal activation.  Discharge to independent HEP progression.       PT Long Term Goals - 01/28/16 1133    PT LONG TERM GOAL #1   Title Pt will demonstrate R knee AROM at 5-120 deg. measured with goniometer to increase pt's ROM for function.    Baseline 9-113 on 01/28/2016   Time 4   Period Weeks   Status Partially Met   PT LONG TERM GOAL #2   Title Pt will demonstrate R hip/knee strength with MMT at 4+/5 to improve pt's LE strength.    Baseline Hip flex/knee ext 4/5, knee flex/hip add/hip abd 4+/5 on 01/28/2016   Time 4   Period Weeks   Status Partially Met   PT LONG TERM GOAL #3   Title Pt will demonstrate 10 R SLR without rest breaks with his leg maintaining a position of knee ext to improve pt's safety in preparation to increase ambulation with a hurry cane.    Baseline Pt able to complete 3x10 SLR with 9 degree ext lag (which is equal to his L LE) on 01/28/2016.   Time 4   Status Achieved   PT LONG TERM GOAL #4   Title Pt will report 45/80 on LEFS self-percieved questionnaire to improve pt's quality of life.   Baseline  LEFS 57/80 on 02/02/2016   Time 4   Period Weeks   Status Achieved               Plan - 01/29/16 1637    Clinical Impression Statement R knee AROM in supine (-9 to 118 deg.).  Good R LE muscle strenthening and will continue to benefit from independent based walking/ HEP.  PT recommends pt. contact MD if any warmth or drainage noted at proximal aspect of knee incision.  Pt. has progressed well towards PT goals and will continue to benefit from use of SPC with outside walking on uneven terrain.  Discharge from PT at this time.     Rehab Potential Good   PT Frequency 2x / week   PT Duration 4 weeks   PT Treatment/Interventions Cryotherapy;Gait training;Neuromuscular re-education;Balance training;Therapeutic exercise;Therapeutic  activities;Functional mobility training;Patient/family education;Manual techniques;Scar mobilization;Passive range of motion   PT Next Visit Plan Discharge   PT Home Exercise Plan See handout.    Consulted and Agree with Plan of Care Patient;Family member/caregiver      Patient will benefit from skilled therapeutic intervention in order to improve the following deficits and impairments:  Abnormal gait, Decreased activity tolerance, Decreased balance, Decreased endurance, Decreased mobility, Decreased range of motion, Difficulty walking, Decreased strength, Decreased scar mobility, Decreased skin integrity, Hypomobility, Increased edema, Impaired flexibility  Visit Diagnosis: Muscle weakness (generalized)  Other abnormalities of gait and mobility  Joint stiffness of knee, right  Gait difficulty  Pain in right knee       G-Codes - Feb 02, 2016 1642    Functional Assessment Tool Used clinical impression/ LEFS/ pain/ ROM/ gait difficulty/ muscle weakness   Functional Limitation Mobility: Walking and moving around   Mobility: Walking and Moving Around Current Status ([id number]) At least 1 percent but less than 20 percent impaired, limited or restricted   Mobility: Walking and Moving Around Goal Status [phone]) At least 1 percent but less than 20 percent impaired, limited or restricted   Mobility: Walking and Moving Around Discharge Status [phone]) At least 1 percent but less than 20 percent impaired, limited or restricted      Problem List There are no active problems to display for this patient.  Pura Spice, PT, DPT # [phone]   01/29/2016, 4:44 PM  [place] [facility] [address] [place], [place], [address] Phone: [phone]   Fax:  [phone]  Name: Ian King MRN: [id number] Date of Birth: September 05, 1941

## 2016-02-03 ENCOUNTER — Ambulatory Visit: Payer: Medicare Other | Attending: Orthopedic Surgery | Admitting: Physical Therapy

## 2016-02-03 DIAGNOSIS — R2689 Other abnormalities of gait and mobility: Secondary | ICD-10-CM | POA: Diagnosis present

## 2016-02-03 DIAGNOSIS — R269 Unspecified abnormalities of gait and mobility: Secondary | ICD-10-CM | POA: Insufficient documentation

## 2016-02-03 DIAGNOSIS — M25561 Pain in right knee: Secondary | ICD-10-CM

## 2016-02-03 DIAGNOSIS — M25661 Stiffness of right knee, not elsewhere classified: Secondary | ICD-10-CM | POA: Diagnosis present

## 2016-02-03 DIAGNOSIS — M6281 Muscle weakness (generalized): Secondary | ICD-10-CM | POA: Diagnosis not present

## 2016-02-03 NOTE — Therapy (Signed)
[place] [facility] [address] [place], [place], [address] Phone: [phone]   Fax:  [phone]  Physical Therapy Treatment  Patient Details  Name: Ian King MRN: [id number] Date of Birth: April 25, 1941 Referring Provider: Juluis Pitch  Encounter Date: 02/03/2016      PT End of Session - 02/03/16 1258    Visit Number 9   Number of Visits 12   Date for PT Re-Evaluation 03/16/16   Authorization - Visit Number 9   Authorization - Number of Visits 18   PT Start Time 3903   PT Stop Time 0092   PT Time Calculation (min) 59 min   Activity Tolerance Patient tolerated treatment well;No increased pain   Behavior During Therapy [facility] for tasks assessed/performed      No past medical history on file.  No past surgical history on file.  There were no vitals filed for this visit.      Subjective Assessment - 02/03/16 1257    Subjective Pt. states MD wants 4 more PT tx. sessions to focus on maximizing/ maintaining R knee ext.  Pt. is open to coming to PT 1x/week x 4 weeks to focus on knee ext./ stretching/ quad strengthening.  Pt. reports no pain at this time and states he has been doing HEP.    Patient is accompained by: Family member   Limitations Standing;Walking;House hold activities;Other (comment)   Patient Stated Goals Increase R knee extension/ maximize mobility.    Currently in Pain? No/denies      OBJECTIVE: TherEx: 10 min SCIFIT at level 7 resistance (forward)- warm up/ no charge with focus on full R knee extension.  Standing hamstring/ gastroc stretches on 1st step.  TG knee flexion/ extension/ heel raises 20x each.  Reviewed HEP/ prone knee extension hangs on bed.   Manual therapy:  Supine R knee scar massage with knee extension stretch/holds.  Prone knee extension with distal hamstring massage and knee extension overpressure (static holds).  Good proximal knee incision healing (decrease redness).      Pt  requires skilled PT services to increase R LE strength, improve B hip mobility, and improve safety with gait without AD. PT focusing on increasing R hamstring muscle length to promote knee extension.  Pt. Limited to -9 deg. With AROM/ -6 deg. PROM after manual stretches currently.  PT really emphasized the importance of daily stretches and activity at home.         PT Long Term Goals - 02/04/16 1957    PT LONG TERM GOAL #1   Title Pt will demonstrate R knee AROM at 5-120 deg. measured with goniometer to increase pt's ROM for function.    Baseline 9-113 on 01/28/2016   Time 4   Period Weeks   Status Partially Met   PT LONG TERM GOAL #2   Title Pt will demonstrate R hip/knee strength with MMT at 4+/5 to improve pt's LE strength.    Baseline Hip flex/knee ext 4/5, knee flex/hip add/hip abd 4+/5 on 01/28/2016   Time 4   Period Weeks   Status Partially Met   PT LONG TERM GOAL #3   Title Pt will demonstrate 10 R SLR without rest breaks with his leg maintaining a position of knee ext to improve pt's safety in preparation to increase ambulation with a hurry cane.    Baseline Pt able to complete 3x10 SLR with 9 degree ext lag (which is equal to his L LE) on 01/28/2016.   Time  4   Period Weeks   Status Achieved   PT LONG TERM GOAL #4   Title Pt will report 45/80 on LEFS self-percieved questionnaire to improve pt's quality of life.   Baseline LEFS 57/80 on 01/28/16   Time 4   Period Weeks   Status Achieved   PT LONG TERM GOAL #5   Title Pt. will ambulate with consistent step pattern/ heel strike on R to promote improved gait pattern/ prevent fall risk.     Baseline limited knee extension/ poor heel strike on R.    Time 6   Period Weeks   Status New            Plan - 02/04/16 1952    Clinical Impression Statement PT able to passively stretch R knee hamstring to -6 deg. extension (pain limited) after prone STM.  Pt. still ambulates with forward posture and limited heel strike due to  lack of B knee extension.  PT will focus on distal hamstring stretching/ manual tx. to maximize knee extension over next 4+ weeks.    Rehab Potential Good   Clinical Impairments Affecting Rehab Potential previous infection postopperatively   PT Frequency 1x / week   PT Duration 6 weeks   PT Treatment/Interventions Cryotherapy;Gait training;Neuromuscular re-education;Balance training;Therapeutic exercise;Therapeutic activities;Functional mobility training;Patient/family education;Manual techniques;Scar mobilization;Passive range of motion   PT Next Visit Plan R knee extension focus.     PT Home Exercise Plan See handout.    Consulted and Agree with Plan of Care Patient;Family member/caregiver      Patient will benefit from skilled therapeutic intervention in order to improve the following deficits and impairments:  Abnormal gait, Decreased activity tolerance, Decreased balance, Decreased endurance, Decreased mobility, Decreased range of motion, Difficulty walking, Decreased strength, Decreased scar mobility, Decreased skin integrity, Hypomobility, Increased edema, Impaired flexibility  Visit Diagnosis: Muscle weakness (generalized)  Joint stiffness of knee, right  Gait difficulty  Pain in right knee       G-Codes - 2016/02/19 1259    Functional Assessment Tool Used clinical impression/ LEFS/ pain/ ROM/ gait difficulty/ muscle weakness   Functional Limitation Mobility: Walking and moving around   Mobility: Walking and Moving Around Current Status ([id number]) At least 1 percent but less than 20 percent impaired, limited or restricted   Mobility: Walking and Moving Around Goal Status [phone]) 0 percent impaired, limited or restricted      Problem List There are no active problems to display for this patient.  Pura Spice, PT, DPT # [phone]   02/04/2016, 8:04 PM  [place] [facility] [address] [place], [place], [address] Phone: [phone]    Fax:  [phone]  Name: Ian King MRN: [id number] Date of Birth: Apr 23, 1941

## 2016-02-10 ENCOUNTER — Ambulatory Visit: Payer: Medicare Other | Admitting: Physical Therapy

## 2016-02-10 DIAGNOSIS — R269 Unspecified abnormalities of gait and mobility: Secondary | ICD-10-CM

## 2016-02-10 DIAGNOSIS — M25661 Stiffness of right knee, not elsewhere classified: Secondary | ICD-10-CM

## 2016-02-10 DIAGNOSIS — M6281 Muscle weakness (generalized): Secondary | ICD-10-CM

## 2016-02-10 DIAGNOSIS — M25561 Pain in right knee: Secondary | ICD-10-CM

## 2016-02-10 DIAGNOSIS — R2689 Other abnormalities of gait and mobility: Secondary | ICD-10-CM

## 2016-02-11 ENCOUNTER — Encounter: Payer: Self-pay | Admitting: Physical Therapy

## 2016-02-11 NOTE — Therapy (Signed)
[facility] [facility] [address] [place], [place], [address] Phone: [phone]   Fax:  [phone]  Physical Therapy Treatment  Patient Details  Name: Ian King MRN: [id number] Date of Birth: November 05, 1940 Referring Provider: Juluis Pitch  Encounter Date: 02/10/2016      PT End of Session - 02/11/16 1518    Visit Number 10   Number of Visits 12   Date for PT Re-Evaluation 03/16/16   Authorization - Visit Number 10   Authorization - Number of Visits 18   PT Start Time 2774   PT Stop Time 1202   PT Time Calculation (min) 48 min   Activity Tolerance Patient tolerated treatment well;No increased pain   Behavior During Therapy [facility] for tasks assessed/performed      History reviewed. No pertinent past medical history.  History reviewed. No pertinent past surgical history.  There were no vitals filed for this visit.      Subjective Assessment - 02/11/16 1450    Subjective Pt. states he is doing well but pulled out several more stitches in proximal aspect of knee with minimal redness noted.  Pt. has a little bloody drainage noted at 1 region around proximal aspect of incision (bandaid placed during manual tx.).      Patient is accompained by: Family member   Limitations Standing;Walking;House hold activities;Other (comment)   Patient Stated Goals Increase R knee extension/ maximize mobility.    Currently in Pain? No/denies   Pain Score 0-No pain      OBJECTIVE: TherEx: 10 min SCIFIT at level 7 resistance (forward)- warm up/ no charge with focus on full R knee extension. TG knee flexion/ extension/ heel raises 20x each (tactile cuing for knee extension). Seated L/R hamstring static stretches 3x each with holds.  Supine R hip/knee manual isometrics (moderate resistance)- 10x each (no pain).  Manual therapy: Supine R knee scar massage with knee extension stretch/holds (bloody drainage noted at region of stitch removal per pt.  Earlier in day). Supine knee extension overpressure (static holds).       Pt requires skilled PT services to increase R LE strength, improve B hip mobility, and improve safety with gait without AD. PT focusing on increasing R hamstring muscle length to promote knee extension. R knee ext. -5 deg. PROM after manual stretches currently with no increase c/o pain. PT really emphasized the importance of daily stretches and activity while on vacation.  Pt. Instructed to attempt to ride stationary on [place] daily.         PT Long Term Goals - 02/04/16 1957    PT LONG TERM GOAL #1   Title Pt will demonstrate R knee AROM at 5-120 deg. measured with goniometer to increase pt's ROM for function.    Baseline 9-113 on 01/28/2016   Time 4   Period Weeks   Status Partially Met   PT LONG TERM GOAL #2   Title Pt will demonstrate R hip/knee strength with MMT at 4+/5 to improve pt's LE strength.    Baseline Hip flex/knee ext 4/5, knee flex/hip add/hip abd 4+/5 on 01/28/2016   Time 4   Period Weeks   Status Partially Met   PT LONG TERM GOAL #3   Title Pt will demonstrate 10 R SLR without rest breaks with his leg maintaining a position of knee ext to improve pt's safety in preparation to increase ambulation with a hurry cane.    Baseline Pt able to complete 3x10 SLR with  9 degree ext lag (which is equal to his L LE) on 01/28/2016.   Time 4   Period Weeks   Status Achieved   PT LONG TERM GOAL #4   Title Pt will report 45/80 on LEFS self-percieved questionnaire to improve pt's quality of life.   Baseline LEFS 57/80 on 01/28/16   Time 4   Period Weeks   Status Achieved   PT LONG TERM GOAL #5   Title Pt. will ambulate with consistent step pattern/ heel strike on R to promote improved gait pattern/ prevent fall risk.     Baseline limited knee extension/ poor heel strike on R.    Time 6   Period Weeks   Status New            Plan - 02/11/16 1832    Clinical Impression  Statement Pt. works hard with PT and able to passively stretch R knee to -5 deg. after patellar mobs./ manual tx./ contract-relax.  Pt. has increase quad/ hamstring strength noted during supine isometrics.  Pt. continues to ambulate with poor upright posture but able to correct with frequent verbal cuing.  Pt. will be on vacation next week and will continue with daily ther.ex./ hamstring stretches.     Rehab Potential Good   Clinical Impairments Affecting Rehab Potential previous infection postopperatively   PT Frequency 1x / week   PT Duration 6 weeks   PT Treatment/Interventions Cryotherapy;Gait training;Neuromuscular re-education;Balance training;Therapeutic exercise;Therapeutic activities;Functional mobility training;Patient/family education;Manual techniques;Scar mobilization;Passive range of motion   PT Next Visit Plan R knee extension focus.  Discuss how functional mobility was on Cruise.    PT Home Exercise Plan See handout.    Consulted and Agree with Plan of Care Patient;Family member/caregiver      Patient will benefit from skilled therapeutic intervention in order to improve the following deficits and impairments:  Abnormal gait, Decreased activity tolerance, Decreased balance, Decreased endurance, Decreased mobility, Decreased range of motion, Difficulty walking, Decreased strength, Decreased scar mobility, Decreased skin integrity, Hypomobility, Increased edema, Impaired flexibility  Visit Diagnosis: Muscle weakness (generalized)  Joint stiffness of knee, right  Gait difficulty  Pain in right knee  Other abnormalities of gait and mobility  Stiffness of right knee, not elsewhere classified     Problem List There are no active problems to display for this patient.  Pura Spice, PT, DPT # [phone]   02/11/2016, 6:36 PM  [place] [facility] [address] [place], [place], [address] Phone: [phone]   Fax:  [phone]  Name:  Ian King MRN: [id number] Date of Birth: 03/29/1941

## 2016-02-24 ENCOUNTER — Ambulatory Visit: Payer: Medicare Other | Admitting: Physical Therapy

## 2016-02-24 DIAGNOSIS — M25561 Pain in right knee: Secondary | ICD-10-CM

## 2016-02-24 DIAGNOSIS — R269 Unspecified abnormalities of gait and mobility: Secondary | ICD-10-CM

## 2016-02-24 DIAGNOSIS — M6281 Muscle weakness (generalized): Secondary | ICD-10-CM | POA: Diagnosis not present

## 2016-02-24 DIAGNOSIS — M25661 Stiffness of right knee, not elsewhere classified: Secondary | ICD-10-CM

## 2016-02-25 NOTE — Therapy (Signed)
[place] [facility] [address] [place], [place], [address] Phone: [phone]   Fax:  [phone]  Physical Therapy Treatment  Patient Details  Name: Ian King MRN: [id number] Date of Birth: 04-14-1941 Referring Provider: Juluis Pitch  Encounter Date: 02/24/2016      PT End of Session - 02/24/16 1126    Visit Number 11   Number of Visits 12   Date for PT Re-Evaluation 03/16/16   Authorization - Visit Number 11   Authorization - Number of Visits 18   PT Start Time 1055   PT Stop Time 8016   PT Time Calculation (min) 58 min   Equipment Utilized During Treatment Gait belt   Activity Tolerance Patient tolerated treatment well;No increased pain   Behavior During Therapy [facility] for tasks assessed/performed      No past medical history on file.  No past surgical history on file.  There were no vitals filed for this visit.      Subjective Assessment - 02/24/16 1100    Subjective Pt. had a great vacation/ [place] and states he did a lot of walking.  No pain in knee reported at this time.    Limitations Standing;Walking;House hold activities;Other (comment)   Patient Stated Goals Increase R knee extension/ maximize mobility.    Currently in Pain? No/denies                                      PT Long Term Goals - 02/04/16 1957    PT LONG TERM GOAL #1   Title Pt will demonstrate R knee AROM at 5-120 deg. measured with goniometer to increase pt's ROM for function.    Baseline 9-113 on 01/28/2016   Time 4   Period Weeks   Status Partially Met   PT LONG TERM GOAL #2   Title Pt will demonstrate R hip/knee strength with MMT at 4+/5 to improve pt's LE strength.    Baseline Hip flex/knee ext 4/5, knee flex/hip add/hip abd 4+/5 on 01/28/2016   Time 4   Period Weeks   Status Partially Met   PT LONG TERM GOAL #3   Title Pt will demonstrate 10 R SLR without rest breaks with his leg maintaining a position  of knee ext to improve pt's safety in preparation to increase ambulation with a hurry cane.    Baseline Pt able to complete 3x10 SLR with 9 degree ext lag (which is equal to his L LE) on 01/28/2016.   Time 4   Period Weeks   Status Achieved   PT LONG TERM GOAL #4   Title Pt will report 45/80 on LEFS self-percieved questionnaire to improve pt's quality of life.   Baseline LEFS 57/80 on 01/28/16   Time 4   Period Weeks   Status Achieved   PT LONG TERM GOAL #5   Title Pt. will ambulate with consistent step pattern/ heel strike on R to promote improved gait pattern/ prevent fall risk.     Baseline limited knee extension/ poor heel strike on R.    Time 6   Period Weeks   Status New             Patient will benefit from skilled therapeutic intervention in order to improve the following deficits and impairments:     Visit Diagnosis: Muscle weakness (generalized)  Joint stiffness of knee, right  Gait difficulty  Pain in right knee     Problem List There are no active problems to display for this patient.   Pura Spice 02/25/2016, 6:46 PM  [place] [facility] [address] [place], [place], [address] Phone: [phone]   Fax:  [phone]  Name: Ian King MRN: [id number] Date of Birth: [birth year]

## 2016-03-02 ENCOUNTER — Ambulatory Visit: Payer: Medicare Other | Admitting: Physical Therapy

## 2016-03-02 ENCOUNTER — Encounter: Payer: Self-pay | Admitting: Physical Therapy

## 2016-03-02 DIAGNOSIS — M25561 Pain in right knee: Secondary | ICD-10-CM

## 2016-03-02 DIAGNOSIS — R2689 Other abnormalities of gait and mobility: Secondary | ICD-10-CM

## 2016-03-02 DIAGNOSIS — M25661 Stiffness of right knee, not elsewhere classified: Secondary | ICD-10-CM

## 2016-03-02 DIAGNOSIS — M6281 Muscle weakness (generalized): Secondary | ICD-10-CM

## 2016-03-02 DIAGNOSIS — R269 Unspecified abnormalities of gait and mobility: Secondary | ICD-10-CM

## 2016-03-02 NOTE — Therapy (Signed)
[facility] [address] [place], [place], [address] Phone: [phone]   Fax:  [phone]  Physical Therapy Treatment  Patient Details  Name: Ian King MRN: [id number] Date of Birth: [birth year] Referring Provider: Dorothey Baseman  Encounter Date: 03/02/2016      PT End of Session - 03/02/16 2004    Visit Number 12   Number of Visits 12   Date for PT Re-Evaluation 03/16/16   Authorization - Visit Number 12   Authorization - Number of Visits 18   PT Start Time 1055   PT Stop Time 1147   PT Time Calculation (min) 52 min   Activity Tolerance Patient tolerated treatment well;No increased pain   Behavior During Therapy [facility] for tasks assessed/performed      History reviewed. No pertinent past medical history.  History reviewed. No pertinent past surgical history.  There were no vitals filed for this visit.      Subjective Assessment - 03/02/16 2003    Subjective Pt. continues to report he is doing well.  Pt. working hard to maintain R knee extension.     Patient is accompained by: Family member   Limitations Standing;Walking;House hold activities;Other (comment)   Patient Stated Goals Increase R knee extension/ maximize mobility.    Currently in Pain? No/denies      OBJECTIVE: TherEx: 10 min SCIFIT at level 7 resistance (forward)- warm up/ no charge with focus on full R knee extension. Standing L/R hamstring static stretches 3x each with holds. Supine R hip/knee manual isometrics (moderate resistance)- 10x each (no pain).  Reviewed HEP in depth. Manual therapy: Supine R knee extension overpressure (static holds)- 10x.  Supine R knee flexion/ extension PROM (as tolerated)- -4 to 119 deg.      Pt requires skilled PT services to increase R LE strength, improve B hip mobility, and improve safety with gait without AD. Discharge from PT with focus on daily walking/ HEP.          PT Long Term Goals -  03/02/16 2008    PT LONG TERM GOAL #1   Title Pt will demonstrate R knee AROM at 5-120 deg. measured with goniometer to increase pt's ROM for function.    Baseline -4 to 119 deg.    Time 4   Period Weeks   Status Achieved   PT LONG TERM GOAL #2   Title Pt will demonstrate R hip/knee strength with MMT at 4+/5 to improve pt's LE strength.    Baseline 4+/5 MMT   Time 4   Period Weeks   Status Achieved   PT LONG TERM GOAL #3   Title Pt will demonstrate 10 R SLR without rest breaks with his leg maintaining a position of knee ext to improve pt's safety in preparation to increase ambulation with a hurry cane.    Baseline Pt able to complete 3x10 SLR with 9 degree ext lag (which is equal to his L LE) on 01/28/2016.   Time 4   Period Weeks   Status Achieved   PT LONG TERM GOAL #4   Title Pt will report 45/80 on LEFS self-percieved questionnaire to improve pt's quality of life.   Baseline LEFS 57/80 on 01/28/16   Time 4   Period Weeks   Status Achieved   PT LONG TERM GOAL #5   Title Pt. will ambulate with consistent step pattern/ heel strike on R to promote improved gait pattern/ prevent fall risk.  Time 6   Period Weeks   Status Achieved            Plan - 03/02/16 2005    Clinical Impression Statement Pt. has progressed well with PT and is showing signs of being at max. physical potential.  R knee AROM: -4 to 119 deg. after manual stretches.  Pt. initially presents with -9 deg. R knee ext. but able to progress with stretches/ ex.  Discharge from PT at this time with focus on independent ex. program.     Rehab Potential Good   Clinical Impairments Affecting Rehab Potential previous infection postopperatively   PT Frequency 1x / week   PT Duration 6 weeks   PT Treatment/Interventions Cryotherapy;Gait training;Neuromuscular re-education;Balance training;Therapeutic exercise;Therapeutic activities;Functional mobility training;Patient/family education;Manual techniques;Scar  mobilization;Passive range of motion   PT Next Visit Plan Discharge   PT Home Exercise Plan See handout.    Consulted and Agree with Plan of Care Patient;Family member/caregiver      Patient will benefit from skilled therapeutic intervention in order to improve the following deficits and impairments:  Abnormal gait, Decreased activity tolerance, Decreased balance, Decreased endurance, Decreased mobility, Decreased range of motion, Difficulty walking, Decreased strength, Decreased scar mobility, Decreased skin integrity, Hypomobility, Increased edema, Impaired flexibility  Visit Diagnosis: Muscle weakness (generalized)  Joint stiffness of knee, right  Gait difficulty  Pain in right knee  Other abnormalities of gait and mobility       G-Codes - 03/02/16 2010    Functional Assessment Tool Used clinical impression/ LEFS/ pain/ ROM/ gait difficulty/ muscle weakness   Functional Limitation Mobility: Walking and moving around   Mobility: Walking and Moving Around Current Status [phone](G8978) At least 1 percent but less than 20 percent impaired, limited or restricted   Mobility: Walking and Moving Around Goal Status [phone](G8979) At least 1 percent but less than 20 percent impaired, limited or restricted   Mobility: Walking and Moving Around Discharge Status [phone](G8980) At least 1 percent but less than 20 percent impaired, limited or restricted      Problem List There are no active problems to display for this patient.  Cammie McgeeMichael C Louie Flenner, PT, DPT # [phone]   03/02/2016, 8:11 PM  [facility] [address] [place], [place], [address] Phone: [phone]   Fax:  [phone]  Name: Ian King MRN: [id number] Date of Birth: 05/14/1941

## 2016-12-19 ENCOUNTER — Encounter: Payer: Self-pay | Admitting: Emergency Medicine

## 2016-12-19 ENCOUNTER — Emergency Department
Admission: EM | Admit: 2016-12-19 | Discharge: 2016-12-19 | Disposition: A | Discharge status: Other Acute Inpatient Hospital | Payer: Medicare Other | Attending: Emergency Medicine | Admitting: Emergency Medicine

## 2016-12-19 ENCOUNTER — Emergency Department: Payer: Medicare Other

## 2016-12-19 DIAGNOSIS — W010XXA Fall on same level from slipping, tripping and stumbling without subsequent striking against object, initial encounter: Secondary | ICD-10-CM | POA: Diagnosis not present

## 2016-12-19 DIAGNOSIS — S5292XA Unspecified fracture of left forearm, initial encounter for closed fracture: Secondary | ICD-10-CM | POA: Insufficient documentation

## 2016-12-19 DIAGNOSIS — Y9301 Activity, walking, marching and hiking: Secondary | ICD-10-CM | POA: Insufficient documentation

## 2016-12-19 DIAGNOSIS — S52615A Nondisplaced fracture of left ulna styloid process, initial encounter for closed fracture: Secondary | ICD-10-CM | POA: Insufficient documentation

## 2016-12-19 DIAGNOSIS — S22009A Unspecified fracture of unspecified thoracic vertebra, initial encounter for closed fracture: Secondary | ICD-10-CM | POA: Insufficient documentation

## 2016-12-19 DIAGNOSIS — W19XXXA Unspecified fall, initial encounter: Secondary | ICD-10-CM | POA: Insufficient documentation

## 2016-12-19 DIAGNOSIS — S22068A Other fracture of T7-T8 thoracic vertebra, initial encounter for closed fracture: Secondary | ICD-10-CM | POA: Diagnosis not present

## 2016-12-19 DIAGNOSIS — S0990XA Unspecified injury of head, initial encounter: Secondary | ICD-10-CM | POA: Diagnosis present

## 2016-12-19 DIAGNOSIS — Y92511 Restaurant or cafe as the place of occurrence of the external cause: Secondary | ICD-10-CM | POA: Insufficient documentation

## 2016-12-19 DIAGNOSIS — S022XXB Fracture of nasal bones, initial encounter for open fracture: Secondary | ICD-10-CM | POA: Diagnosis not present

## 2016-12-19 DIAGNOSIS — S62102A Fracture of unspecified carpal bone, left wrist, initial encounter for closed fracture: Secondary | ICD-10-CM

## 2016-12-19 DIAGNOSIS — M459 Ankylosing spondylitis of unspecified sites in spine: Secondary | ICD-10-CM | POA: Insufficient documentation

## 2016-12-19 DIAGNOSIS — Y999 Unspecified external cause status: Secondary | ICD-10-CM | POA: Diagnosis not present

## 2016-12-19 HISTORY — DX: Unspecified atrial fibrillation: I48.91

## 2016-12-19 MED ORDER — ONDANSETRON HCL 4 MG/2ML IJ SOLN
INTRAMUSCULAR | Status: AC
  Filled 2016-12-19: qty 2

## 2016-12-19 MED ORDER — TRANEXAMIC ACID 1000 MG/10ML IV SOLN
500.0000 mg | Freq: Once | INTRAVENOUS | Status: AC
  Administered 2016-12-19: 500 mg via TOPICAL
  Filled 2016-12-19: qty 10

## 2016-12-19 MED ORDER — MORPHINE SULFATE (PF) 4 MG/ML IV SOLN
4.0000 mg | Freq: Once | INTRAVENOUS | Status: AC
  Administered 2016-12-19: 4 mg via INTRAVENOUS
  Filled 2016-12-19: qty 1

## 2016-12-19 MED ORDER — MORPHINE SULFATE (PF) 4 MG/ML IV SOLN
INTRAVENOUS | Status: AC
  Filled 2016-12-19: qty 1

## 2016-12-19 MED ORDER — ONDANSETRON HCL 4 MG/2ML IJ SOLN
4.0000 mg | Freq: Once | INTRAMUSCULAR | Status: AC
  Administered 2016-12-19: 4 mg via INTRAVENOUS
  Filled 2016-12-19: qty 2

## 2016-12-19 NOTE — ED Triage Notes (Signed)
[facility] patient tripped on the sidewalk and fell forward.  Patient is complaining of left wrist pain and left wrist appears deformed.  Patient has abrasion to his forehead and bleeding to the bridge of his nose.  Patient states he fell approx. 1 hour prior to arrival.  Patient takes xarelto due to his afib.  Patient denies losing consciousness and is alert and oriented x 4 at this time.  Patient is also complaining of back pain.

## 2016-12-19 NOTE — ED Provider Notes (Signed)
[facility] Emergency Department Provider Note   ____________________________________________    I have reviewed the triage vital signs and the nursing notes.   HISTORY  Chief Complaint Fall     HPI Ian King is a 76 y.o. male who presents after a fall. Patient reports that he tripped on the sidewalk walking into a restaurant and fell 4 known to his face. He is on blood thinners for atrial fibrillation. He complains of left wrist pain, bleeding from his face and scalp mild right knee pain and mid back pain. No loss of consciousness. No chest pain. No dizziness.   Past Medical History:  Diagnosis Date  . A-fib (HCC)     There are no active problems to display for this patient.   Past Surgical History:  Procedure Laterality Date  . arm surgery    . BACK SURGERY    . JOINT REPLACEMENT      Prior to Admission medications   Not on File     Allergies Lovastatin  No family history on file.  Social History Patient does not smoke nor drink  Review of Systems  Constitutional: No dizziness Eyes: No visual changes.  ENT: Nasal injury Cardiovascular: Denies chest pain. Respiratory: Denies shortness of breath. Gastrointestinal: No abdominal pain.  No nausea, no vomiting.    Musculoskeletal: Mid back pain Skin: Abrasion to the forehead Neurological: Negative for headaches or weakness  10-point ROS otherwise negative.  ____________________________________________   PHYSICAL EXAM:  VITAL SIGNS: ED Triage Vitals  Enc Vitals Group     BP 12/19/16 1037 (!) 173/90     Pulse Rate 12/19/16 1037 82     Resp 12/19/16 1037 12     Temp 12/19/16 1037 97.5 F (36.4 C)     Temp Source 12/19/16 1037 Oral     SpO2 12/19/16 1037 97 %     Weight 12/19/16 1038 240 lb (108.9 kg)     Height 12/19/16 1038 5' 10.5" (1.791 m)     Head Circumference --      Peak Flow --      Pain Score 12/19/16 1038 7     Pain Loc --      Pain Edu? --    Excl. in GC? --     Constitutional: Alert and oriented. No acute distress.  Eyes: Conjunctivae are normal.  Head: Deep abrasion to the forehead, and superior bridge of the nose, no foreign bodies noted. Nose: No epistaxis, open wound overlying the bridge of the nose, does not appear to extend to the bone/cartilage Mouth/Throat: Mucous membranes are moist.   Neck:  Painless ROM, no vertebral tenderness to palpation Cardiovascular: Normal rate, regular rhythm. Grossly normal heart sounds.  Good peripheral circulation. Respiratory: Normal respiratory effort.  No retractions. Lungs CTAB. Gastrointestinal: Soft and nontender. No distention.  No CVA tenderness. Genitourinary: deferred Musculoskeletal: Back: Vertebral tenderness to palpation mid thoracic spine. Lumbar spine unremarkable. Warm and well perfused extremities. Left wrist obvious deformity, normal cap refill, normal pulses, normal sensation Neurologic:  Normal speech and language. No gross focal neurologic deficits are appreciated.  Skin:  Skin is warm, dry Psychiatric: Mood and affect are normal. Speech and behavior are normal.  ____________________________________________   LABS (all labs ordered are listed, but only abnormal results are displayed)  Labs Reviewed - No data to display ____________________________________________  EKG  None ____________________________________________  RADIOLOGY  Positive nasal bone fracture T8 fracture ____________________________________________   PROCEDURES  Procedure(s) performed: No  Critical Care performed: yes  CRITICAL CARE Performed by: Jene Every   Total critical care time:30 minutes  Critical care time was exclusive of separately billable procedures and treating other patients.  Critical care was necessary to treat or prevent imminent or life-threatening deterioration.  Critical care was time spent personally by me on the following activities: development  of treatment plan with patient and/or surrogate as well as nursing, discussions with consultants, evaluation of patient's response to treatment, examination of patient, obtaining history from patient or surrogate, ordering and performing treatments and interventions, ordering and review of laboratory studies, ordering and review of radiographic studies, pulse oximetry and re-evaluation of patient's condition.  ____________________________________________   INITIAL IMPRESSION / ASSESSMENT AND PLAN / ED COURSE  Pertinent labs & imaging results that were available during my care of the patient were reviewed by me and considered in my medical decision making (see chart for details).  Patient with fall 4, obvious deformity to left wrist. Deep abrasions to the forehead and nose, does not appear to be amenable to suturing as large portions of skin were removed. Bleeding from bridge of nose, small pulsatile amount of bleeding, we will hold pressure on this     ----------------------------------------- 12:26 PM on 12/19/2016 ----------------------------------------- Discussed with Dr. Teola Bradley of neurosurgery. He studied the CT scan and recommended transfer to [facility] given the T8 fracture and ankylosing spondylitis  Discussed with patient and he agrees with this plan as his orthopedist is at [facility] and he gets all of his care at [facility]  Patient with continued bleeding from laceration at the superior aspect of his nasal bridge. Attempted Surgicel and pressure without significant improvement. Soaked gauze in TXA and applied this to his nose and this seemed to stop the bleeding. I'm concerned this may be an open fracture of the nose, patient will be evaluated by trauma team at [facility]   ____________________________________________   FINAL CLINICAL IMPRESSION(S) / ED DIAGNOSES  Final diagnoses:  Other closed fracture of eighth thoracic vertebra, initial encounter (HCC)  Closed fracture of left wrist,  initial encounter  Open fracture of nasal bone, initial encounter      NEW MEDICATIONS STARTED DURING THIS VISIT:  There are no discharge medications for this patient.    Note:  This document was prepared using Dragon voice recognition software and may include unintentional dictation errors.    Jene Every, MD 12/19/16 [phone]

## 2017-01-12 DIAGNOSIS — S022XXD Fracture of nasal bones, subsequent encounter for fracture with routine healing: Secondary | ICD-10-CM | POA: Insufficient documentation

## 2017-01-16 DIAGNOSIS — S52502D Unspecified fracture of the lower end of left radius, subsequent encounter for closed fracture with routine healing: Secondary | ICD-10-CM | POA: Insufficient documentation

## 2017-02-13 ENCOUNTER — Ambulatory Visit: Payer: Medicare Other | Attending: Orthopedic Surgery | Admitting: Occupational Therapy

## 2017-02-13 ENCOUNTER — Encounter: Payer: Self-pay | Admitting: Occupational Therapy

## 2017-02-13 DIAGNOSIS — M25622 Stiffness of left elbow, not elsewhere classified: Secondary | ICD-10-CM | POA: Diagnosis present

## 2017-02-13 DIAGNOSIS — M25642 Stiffness of left hand, not elsewhere classified: Secondary | ICD-10-CM | POA: Insufficient documentation

## 2017-02-13 DIAGNOSIS — M79602 Pain in left arm: Secondary | ICD-10-CM

## 2017-02-13 DIAGNOSIS — M6281 Muscle weakness (generalized): Secondary | ICD-10-CM | POA: Insufficient documentation

## 2017-02-13 DIAGNOSIS — M25632 Stiffness of left wrist, not elsewhere classified: Secondary | ICD-10-CM | POA: Diagnosis present

## 2017-02-13 NOTE — Therapy (Signed)
[facility] [address], [place], [address] Phone: [phone]   Fax:  [phone]  Occupational Therapy Evaluation  Patient Details  Name: Ian King MRN: [id number] Date of Birth: 1941-01-19 Referring Provider: Eliezer Lofts  Encounter Date: 02/13/2017      OT End of Session - 02/13/17 1749    Visit Number 1   Number of Visits 16   Date for OT Re-Evaluation 04/10/17   OT Start Time 1035   OT Stop Time 1159   OT Time Calculation (min) 84 min   Activity Tolerance Patient tolerated treatment well   Behavior During Therapy [facility] for tasks assessed/performed      Past Medical History:  Diagnosis Date  . A-fib (HCC)   . Diabetes mellitus without complication (HCC)   . Hypertension     Past Surgical History:  Procedure Laterality Date  . arm surgery    . BACK SURGERY    . JOINT REPLACEMENT     hip  . JOINT REPLACEMENT Right    knee  . SPINE SURGERY      There were no vitals filed for this visit.      Subjective Assessment - 02/13/17 1626    Subjective  I fell on 3/19 - in ER splinted my wrist - and then seen Dr Jacqualyn Posey 4/12 - was in long cast , then short - and since 5/3 in vecro splint - Mallet finger was diagnosis  2-3 wks later than fall - just left rehab facility last Thursday    Patient Stated Goals I want to get as much use of my L hand and arm as I could - I am L handed - and want to write and do soduko, eat , hygiene, dressing , ect    Currently in Pain? Yes   Pain Score 2    Pain Location Arm   Pain Orientation Left   Pain Descriptors / Indicators Aching   Pain Type Surgical pain   Pain Onset More than a month ago           [facility] OT Assessment - 02/13/17 0001      Assessment   Diagnosis L distal radius fx and 3rd Mallet finger    Referring Provider Leversedge   Onset Date 12/19/16   Prior Therapy had therapy in rehab     Precautions   Precautions Cervical  Had  Cervical surgerry about 6 wks ago   Precaution Comments 10 lbs limit   Required Braces or Orthoses Cervical Brace;Other Brace/Splint  mallet finger , wrist splint      Restrictions   Weight Bearing Restrictions Yes     Balance Screen   Has the patient fallen in the past 6 months Yes   How many times? 1   Has the patient had a decrease in activity level because of a fear of falling?  Yes   Is the patient reluctant to leave their home because of a fear of falling?  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, L hand dominant , likes to do soduko, work in yard, in workshop, garden, cruises     AROM   Left Elbow Flexion 120   Left Elbow Extension -30   Left Forearm Pronation 57 Degrees   Left Forearm Supination 50 Degrees  Right Wrist Extension 63 Degrees   Right Wrist Flexion 63 Degrees   Right Wrist Radial Deviation 6 Degrees   Right Wrist Ulnar Deviation 13 Degrees   Left Wrist Extension 25 Degrees   Left Wrist Flexion 28 Degrees   Left Wrist Radial Deviation 23 Degrees   Left Wrist Ulnar Deviation 36 Degrees     Right Hand AROM   R Thumb MCP 0-60 50 Degrees   R Thumb IP 0-80 60 Degrees   R Thumb Radial ABduction/ADduction 0-55 53   R Thumb Palmar ABduction/ADduction 0-45 58   R Index  MCP 0-90 80 Degrees   R Index PIP 0-100 100 Degrees   R Long  MCP 0-90 80 Degrees   R Long PIP 0-100 100 Degrees   R Ring  MCP 0-90 80 Degrees   R Ring PIP 0-100 100 Degrees   R Little  MCP 0-90 80 Degrees   R Little PIP 0-100 100 Degrees     Left Hand AROM   L Thumb MCP 0-60 50 Degrees   L Thumb IP 0-80 50 Degrees   L Thumb Radial ADduction/ABduction 0-55 45   L Thumb Palmar ADduction/ABduction 0-45 45   L Thumb Opposition to Index --  Opposition lacking 2 cm from 4thand 5th    L Index  MCP 0-90 75 Degrees   L Index PIP 0-100 80 Degrees   L Long  MCP 0-90 75 Degrees   L Long PIP  0-100 70 Degrees   L Long DIP 0-70 -20 Degrees  40 flexion   L Ring  MCP 0-90 85 Degrees   L Ring PIP 0-100 80 Degrees   L Little  MCP 0-90 85 Degrees   L Little PIP 0-100 90 Degrees      Heating pad  To L forearm and hand prior to review of HEP  With pt and wife  Hand out provided  And Mallet finger splint made with hyper extention of DIP  And padded wrist splint inside - pt's ulnar styloid red and tender since in prefab splint per pt's wife   Pt HEP for home:  Heat on L hand prior to ROM  Tendon glides - full fist to 2cm and 1 cm object Thumb PA and RA  Opposition - pick up 1 cm object  Wrist AROM in all planes  Slight pull   pain less than 2/10  3 x day  Remove Mallet splint for exercises And on rest of time  Wrist splint on in between exercises                       OT Education - 02/13/17 1743    Education provided Yes   Education Details Findings of eval and HEP    Person(s) Educated Patient;Spouse   Methods Explanation;Demonstration;Tactile cues;Verbal cues;Handout   Comprehension Verbal cues required;Returned demonstration;Verbalized understanding          OT Short Term Goals - 02/13/17 1800      OT SHORT TERM GOAL #1   Title Pt to be ind in HEP to increase AROM in digits and wrist - splint wearing for Mallet/wrist    Baseline no knowledge for HEP for digits and wrist    Time 2   Period Weeks   Status New     OT SHORT TERM GOAL #2   Title AROM in L 3rd digit improve for pt to touch palm without extention lag more than -10 degrees  Baseline -20 extention , Flexion 40 at DIP and PIP 70 -    Time 5   Period Weeks   Status New     OT SHORT TERM GOAL #3   Title Pain on PRWHE improve with 15 points    Baseline at eval pain on PRWHE 26/50   Time 4   Period Weeks   Status New     OT SHORT TERM GOAL #4   Title L wrist AROM improve for pt turn page, take change in palm, eat 50% with L hand    Baseline See flowsheet -    Time 54    Period Weeks   Status New           OT Long Term Goals - 02/13/17 1808      OT LONG TERM GOAL #1   Title L wrist AROM improve to more than 75% compare to R to use in feeding , pulling up pants, bathing    Baseline see flowsheet    Time 6   Period Weeks   Status New     OT LONG TERM GOAL #2   Title L thumb and digits AROM improve to [facility] for pt to do buttons, write , pull up pants, hold utencils    Baseline see flowsheet    Time 6   Period Weeks   Status New     OT LONG TERM GOAL #3   Title Assess grip and prehension strength when ordered by MD    Baseline NT yet   Time 4   Period Weeks   Status New     OT LONG TERM GOAL #4   Title Function score on PRWHE improve with more than 30 points    Baseline Function score on PRHWE at eval 50/50    Time 8   Period Weeks               Plan - 02/13/17 1750    Clinical Impression Statement Mr. Sibal is retired 76 year old male who suffered from a fall on 12/19/2016 resulting in numerous injuries including a left distal radius fracture, left middle finger mallet injury (versus possible distal portion of middle phalanx fracture), left open nasal bone fracture, and vertebral fractures( had surgery about 6 wks ago , in cervical splint ). With respect to his left wrist the patient was treated with a closed reduction and splinting. His finger was placed in a DIP extension splint- pt presented at this eval  8 wks out from injury - recieved MD order for OT to eval and treat AROM to wrist and fingers -  AAROM for elbow and shoulder - pt present with decrease ROM , strength in all joints in L domimant UE - increase pain - all limitiing his functional use - pt with dry, flaky skin  distal to elbow and 2 scabs on DIP of 3rd digit - pt can benefit from OT services to increase ROM and decrease pain - increase use and strength as progressing    Rehab Potential Good   OT Frequency 2x / week   OT Duration 8 weeks   OT Treatment/Interventions  Self-care/ADL training;Fluidtherapy;Splinting;Patient/family education;Therapeutic exercises;Contrast Bath;Parrafin;Manual Therapy;Passive range of motion;Ultrasound   Plan assess progress with HEP    OT Home Exercise Plan see pt instruction   Consulted and Agree with Plan of Care Patient;Family member/caregiver      Patient will benefit from skilled therapeutic intervention in order to improve the following deficits and impairments:  Decreased  range of motion, Decreased coordination, Impaired flexibility, Decreased knowledge of precautions, Decreased strength, Impaired UE functional use, Pain, Decreased knowledge of use of DME, Decreased skin integrity  Visit Diagnosis: Stiffness of left hand, not elsewhere classified - Plan: Ot plan of care cert/re-cert  Stiffness of left wrist, not elsewhere classified - Plan: Ot plan of care cert/re-cert  Stiffness of left elbow, not elsewhere classified - Plan: Ot plan of care cert/re-cert  Pain in left arm - Plan: Ot plan of care cert/re-cert  Muscle weakness (generalized) - Plan: Ot plan of care cert/re-cert    Problem List There are no active problems to display for this patient.   Oletta CohnuPreez, Amna Welker  OTR/L,CLT 02/13/2017, 6:19 PM  [facility] [address] [place], [place], [address] Phone: [phone]   Fax:  [phone]  Name: Neomia GlassLarry L King MRN: [id number] Date of Birth: 03/12/1941

## 2017-02-13 NOTE — Patient Instructions (Addendum)
Heat on L hand prior to ROM  Tendon glides - full fist to 2cm and 1 cm object Thumb PA and RA  Opposition - pick up 1 cm object  Wrist AROM in all planes  Slight pull   pain less than 2/10  3 x day  Remove Mallet splint for exercises And on rest of time  Wrist splint on in between exercises

## 2017-02-15 ENCOUNTER — Ambulatory Visit: Payer: Medicare Other | Admitting: Occupational Therapy

## 2017-02-15 DIAGNOSIS — M25622 Stiffness of left elbow, not elsewhere classified: Secondary | ICD-10-CM

## 2017-02-15 DIAGNOSIS — M25632 Stiffness of left wrist, not elsewhere classified: Secondary | ICD-10-CM

## 2017-02-15 DIAGNOSIS — M79602 Pain in left arm: Secondary | ICD-10-CM

## 2017-02-15 DIAGNOSIS — M6281 Muscle weakness (generalized): Secondary | ICD-10-CM

## 2017-02-15 DIAGNOSIS — M25642 Stiffness of left hand, not elsewhere classified: Secondary | ICD-10-CM | POA: Diagnosis not present

## 2017-02-15 NOTE — Patient Instructions (Addendum)
Add  AROM blocked IP and MP of thumb  Tendon glides -only to do MC flexion with IP extention and Mallet splint on   Elbow extention standing - 3 positions against wall  Change flexion of elbow to AAROM - and done- attempt to get palm on ear  10 reps done

## 2017-02-15 NOTE — Therapy (Signed)
[place] [facility] PHYSICAL AND SPORTS MEDICINE [address] [place], [place], [address] Phone: [phone]   Fax:  [phone]  Occupational Therapy Treatment  Patient Details  Name: Ian King MRN: [id number] Date of Birth: 10/16/1940 Referring Provider: Eliezer LoftsLeversedge  Encounter Date: 02/15/2017      OT End of Session - 02/15/17 1723    Visit Number 2   Number of Visits 16   Date for OT Re-Evaluation 04/10/17   OT Start Time 1247   OT Stop Time 1348   OT Time Calculation (min) 61 min   Activity Tolerance Patient tolerated treatment well   Behavior During Therapy [facility] for tasks assessed/performed      Past Medical History:  Diagnosis Date  . A-fib (HCC)   . Diabetes mellitus without complication (HCC)   . Hypertension     Past Surgical History:  Procedure Laterality Date  . arm surgery    . BACK SURGERY    . JOINT REPLACEMENT     hip  . JOINT REPLACEMENT Right    knee  . SPINE SURGERY      There were no vitals filed for this visit.      Subjective Assessment - 02/15/17 1312    Subjective  I did the exercises as good that I could remember it from doing it here- pain okay - but the black splint hurting the inside of thumb and then the finter splint is sliding up  - not staying on    Patient Stated Goals I want to get as much use of my L hand and arm as I could - I am L handed - and want to write and do soduko, eat , hygiene, dressing , ect    Currently in Pain? Yes   Pain Score 2    Pain Location Arm   Pain Orientation Left   Pain Descriptors / Indicators Aching            [facility] OT Assessment - 02/15/17 0001      AROM   Left Forearm Pronation 65 Degrees   Left Forearm Supination 66 Degrees   Right Wrist Radial Deviation 1616 Degrees   Left Wrist Extension 30 Degrees   Left Wrist Flexion 35 Degrees                  OT Treatments/Exercises (OP) - 02/15/17 0001      Moist Heat Therapy   Number Minutes Moist  Heat 10 Minutes   Mo[facility] Heat [facility] --  [facility] , wrist , forearm and elbow to increase ROM       Measure  L wrist ROM and fisting screened - see flowsheet  Pt's Mallet finger splint sliding down - refabricate Mallet splint and pt to put piece of coban under 2 velcro straps applied this session Appear to stay in place and not slide - pt ed on if doffing few times to air - keep finger on table - do not bend it   Moist heat done  padded wrist splint in webspace   Soft tissue mobs done to webspace , with gentle PROM and stretch into PA and RA of thumb , flexion of IP and MP  Followed by AROM blocked IP and MP of thumb  Tendon glides -only to do MC flexion with IP extention and Mallet splint on   Opposition - pick up 1 cm object  Wrist AROM done for sup/pro, ext and flexion and UD /RD  10reps   Elbow extention standing - 3 positions against wall  Change flexion of elbow to AAROM - and done- attempt to get palm on ear  10 reps done              OT Education - 02/15/17 1722    Education provided Yes   Education Details HEP updated and review   Person(s) Educated Patient   Methods Explanation;Demonstration;Tactile cues;Verbal cues   Comprehension Verbal cues required;Returned demonstration;Verbalized understanding          OT Short Term Goals - 02/13/17 1800      OT SHORT TERM GOAL #1   Title Pt to be ind in HEP to increase AROM in digits and wrist - splint wearing for Mallet/wrist    Baseline no knowledge for HEP for digits and wrist    Time 2   Period Weeks   Status New     OT SHORT TERM GOAL #2   Title AROM in L 3rd digit improve for pt to touch palm without extention lag more than -10 degrees     Baseline -20 extention , Flexion 40 at DIP and PIP 70 -    Time 5   Period Weeks   Status New     OT SHORT TERM GOAL #3   Title Pain on PRWHE improve with 15 points    Baseline at eval pain on PRWHE 26/50   Time 4   Period Weeks   Status New     OT  SHORT TERM GOAL #4   Title L wrist AROM improve for pt turn page, take change in palm, eat 50% with L hand    Baseline See flowsheet -    Time 54   Period Weeks   Status New           OT Long Term Goals - 02/13/17 1808      OT LONG TERM GOAL #1   Title L wrist AROM improve to more than 75% compare to R to use in feeding , pulling up pants, bathing    Baseline see flowsheet    Time 6   Period Weeks   Status New     OT LONG TERM GOAL #2   Title L thumb and digits AROM improve to [facility] for pt to do buttons, write , pull up pants, hold utencils    Baseline see flowsheet    Time 6   Period Weeks   Status New     OT LONG TERM GOAL #3   Title Assess grip and prehension strength when ordered by MD    Baseline NT yet   Time 4   Period Weeks   Status New     OT LONG TERM GOAL #4   Title Function score on PRWHE improve with more than 30 points    Baseline Function score on PRHWE at eval 50/50    Time 8   Period Weeks               Plan - 02/15/17 1723    Clinical Impression Statement Pt made progress in AROM in digits flexion and wrist since just 2 days ago - only doing AROM - but AAROM to elbow - pt Mallet finger splint slipped - refabricated mallet finger splint and applied velcro staps this time - pt to hold off on full fisting and intrinsic    Rehab Potential Good   OT Frequency 2x / week   OT Duration 8 weeks  OT Treatment/Interventions Self-care/ADL training;Fluidtherapy;Splinting;Patient/family education;Therapeutic exercises;Contrast Bath;Parrafin;Manual Therapy;Passive range of motion;Ultrasound   Plan assess progress and mallet finger splint use   OT Home Exercise Plan see pt instruction   Consulted and Agree with Plan of Care Patient;Family member/caregiver      Patient will benefit from skilled therapeutic intervention in order to improve the following deficits and impairments:  Decreased range of motion, Decreased coordination, Impaired flexibility,  Decreased knowledge of precautions, Decreased strength, Impaired UE functional use, Pain, Decreased knowledge of use of DME, Decreased skin integrity  Visit Diagnosis: Stiffness of left hand, not elsewhere classified  Stiffness of left wrist, not elsewhere classified  Stiffness of left elbow, not elsewhere classified  Pain in left arm  Muscle weakness (generalized)    Problem List There are no active problems to display for this patient.   Oletta Cohn OTR/L,CLT 02/15/2017, 5:44 PM  [facility] PHYSICAL AND SPORTS MEDICINE [address] [place], [address] Phone: [phone]   Fax:  [phone]  Name: Ian King MRN: [id number] Date of Birth: Nov 15, 1940

## 2017-02-20 ENCOUNTER — Ambulatory Visit: Payer: Medicare Other | Admitting: Occupational Therapy

## 2017-02-20 DIAGNOSIS — M79602 Pain in left arm: Secondary | ICD-10-CM

## 2017-02-20 DIAGNOSIS — M25632 Stiffness of left wrist, not elsewhere classified: Secondary | ICD-10-CM

## 2017-02-20 DIAGNOSIS — M25622 Stiffness of left elbow, not elsewhere classified: Secondary | ICD-10-CM

## 2017-02-20 DIAGNOSIS — M25642 Stiffness of left hand, not elsewhere classified: Secondary | ICD-10-CM | POA: Diagnosis not present

## 2017-02-20 DIAGNOSIS — M6281 Muscle weakness (generalized): Secondary | ICD-10-CM

## 2017-02-20 NOTE — Patient Instructions (Addendum)
Pt to use heat prior to ROM to elbow

## 2017-02-20 NOTE — Therapy (Signed)
[place] [facility] PH[facility]TS MEDICINE [address] [place], [place], [address] Phone: [phone]   Fax:  [phone]  Occupational Therapy Treatment  Patient Details  Name: Neomia GlassLarry L Leisner MRN: [id number] Date of Birth: 12/28/1940 Referring Provider: Eliezer LoftsLeversedge  Encounter Date: 02/20/2017      OT End of Session - 02/20/17 1410    Visit Number 3   Number of Visits 16   Date for OT Re-Evaluation 04/10/17   OT Start Time 1059   OT Stop Time 1145   OT Time Calculation (min) 46 min   Activity Tolerance Patient tolerated treatment well   Behavior During Therapy [facility] for tasks assessed/performed      Past Medical History:  Diagnosis Date  . A-fib (HCC)   . Diabetes mellitus without complication (HCC)   . Hypertension     Past Surgical History:  Procedure Laterality Date  . arm surgery    . BACK SURGERY    . JOINT REPLACEMENT     hip  . JOINT REPLACEMENT Right    knee  . SPINE SURGERY      There were no vitals filed for this visit.      Subjective Assessment - 02/20/17 1408    Subjective  I did do my exercises- I did not take my finger splint off -and then my wrist splint hurting the inside of my thumb - did do elbow exercises but did not use heat - still about the same thumb to ear    Patient Stated Goals I want to get as much use of my L hand and arm as I could - I am L handed - and want to write and do soduko, eat , hygiene, dressing , ect    Currently in Pain? No/denies            [facility] - 02/20/17 0001      AROM   Left Forearm Supination 84 Degrees   Left Wrist Extension 40 Degrees   Left Wrist Flexion 39 Degrees                  OT Treatments/Exercises (OP) - 02/20/17 0001      Moist Heat Therapy   Number Minutes Moist Heat 10 Minutes   Moist Heat Location --  in supine with elbow extention and large heatingpad use       Measure  L wrist ROM and fisting - see flowsheet  Pt's  Mallet  splint  On - pt did not take it off since last time - some edema inbetween 2 strap - done some massage supported on table  Pt and wife ed again that they can take splint off if finger is supported and massage dorsal side of DIP for edema -  Pt unable to keep splint on if only coban on - slides off  When remove in clinic pt keep DIP at -5 -but after 2 min show increase flexion of DIP  Tendon glides done with Mallet finger splint on - pt to focus on MC flexion and PIP flexion   Moist heat done on elbow for extention stretch in supine   Soft tissue mobs done  Over bicep and volar elbow using graston tool nr 2  Prior to ROM  Elbow flexion AAROM and extention - PROM and then AAROM from extention into flexion - 3 positions  Wrist AROM done for sup/pro, ext and flexion and UD /RD  10reps  OT Education - 02/20/17 1409    Education provided Yes   Education Details to use heat for elbow prior to ROM - can take off splint if just sitting doing nothing to help thumb webspace    Person(s) Educated Patient   Methods Explanation;Tactile cues;Demonstration;Verbal cues   Comprehension Verbal cues required;Returned demonstration;Verbalized understanding          OT Short Term Goals - 02/13/17 1800      OT SHORT TERM GOAL #1   Title Pt to be ind in HEP to increase AROM in digits and wrist - splint wearing for Mallet/wrist    Baseline no knowledge for HEP for digits and wrist    Time 2   Period Weeks   Status New     OT SHORT TERM GOAL #2   Title AROM in L 3rd digit improve for pt to touch palm without extention lag more than -10 degrees     Baseline -20 extention , Flexion 40 at DIP and PIP 70 -    Time 5   Period Weeks   Status New     OT SHORT TERM GOAL #3   Title Pain on PRWHE improve with 15 points    Baseline at eval pain on PRWHE 26/50   Time 4   Period Weeks   Status New     OT SHORT TERM GOAL #4   Title L wrist AROM improve for pt turn page, take  change in palm, eat 50% with L hand    Baseline See flowsheet -    Time 54   Period Weeks   Status New           OT Long Term Goals - 02/13/17 1808      OT LONG TERM GOAL #1   Title L wrist AROM improve to more than 75% compare to R to use in feeding , pulling up pants, bathing    Baseline see flowsheet    Time 6   Period Weeks   Status New     OT LONG TERM GOAL #2   Title L thumb and digits AROM improve to [facility] for pt to do buttons, write , pull up pants, hold utencils    Baseline see flowsheet    Time 6   Period Weeks   Status New     OT LONG TERM GOAL #3   Title Assess grip and prehension strength when ordered by MD    Baseline NT yet   Time 4   Period Weeks   Status New     OT LONG TERM GOAL #4   Title Function score on PRWHE improve with more than 30 points    Baseline Function score on PRHWE at eval 50/50    Time 8   Period Weeks               Plan - 02/20/17 1653    Clinical Impression Statement Pt made in 3 visits great progress in AROM at L wrist , and digits flexion - but Mallet finger splint still on until appt with MD - elbow showed improvement but pt to do heat prior to ROM HEP - pt to see MD tomorrow - await new orders and if can start PROM for wrist - assess if can start AROM for DIP of 3rd    Rehab Potential Good   OT Frequency 2x / week   OT Duration 6 weeks   OT Treatment/Interventions Self-care/ADL training;Fluidtherapy;Splinting;Patient/family education;Therapeutic exercises;Contrast Bath;Parrafin;Manual Therapy;Passive  range of motion;Ultrasound   Plan results from MD visit    OT Home Exercise Plan see pt instruction   Consulted and Agree with Plan of Care Family member/caregiver;Patient      Patient will benefit from skilled therapeutic intervention in order to improve the following deficits and impairments:  Decreased range of motion, Decreased coordination, Impaired flexibility, Decreased knowledge of precautions, Decreased strength,  Impaired UE functional use, Pain, Decreased knowledge of use of DME, Decreased skin integrity  Visit Diagnosis: Stiffness of left hand, not elsewhere classified  Stiffness of left wrist, not elsewhere classified  Stiffness of left elbow, not elsewhere classified  Pain in left arm  Muscle weakness (generalized)    Problem List There are no active problems to display for this patient.   Oletta Cohn OTR/L,CLT 02/20/2017, 4:56 PM  [facility] PHYSICAL AND SPORTS MEDICINE [address], [place], [address] Phone: [phone]   Fax:  [phone]  Name: LYNWOOD KUBISIAK MRN: [id number] Date of Birth: [birth year]

## 2017-02-22 ENCOUNTER — Ambulatory Visit: Payer: Medicare Other | Admitting: Occupational Therapy

## 2017-02-22 DIAGNOSIS — M79602 Pain in left arm: Secondary | ICD-10-CM

## 2017-02-22 DIAGNOSIS — M25642 Stiffness of left hand, not elsewhere classified: Secondary | ICD-10-CM

## 2017-02-22 DIAGNOSIS — M6281 Muscle weakness (generalized): Secondary | ICD-10-CM

## 2017-02-22 DIAGNOSIS — M25632 Stiffness of left wrist, not elsewhere classified: Secondary | ICD-10-CM

## 2017-02-22 DIAGNOSIS — M25622 Stiffness of left elbow, not elsewhere classified: Secondary | ICD-10-CM

## 2017-02-22 NOTE — Therapy (Signed)
[place] [facility] PH[facility] SPORTS MEDICINE [address], [place], [address] Phone: [phone]   Fax:  [phone]  Occupational Therapy Treatment  Patient Details  Name: Ian King MRN: [id number] Date of Birth: [birth year] Referring Provider: Eliezer Lofts  Encounter Date: 02/22/2017      OT End of Session - 02/22/17 1102    Visit Number 4   Number of Visits 16   Date for OT Re-Evaluation 04/10/17   OT Start Time 0958   OT Stop Time 1055   OT Time Calculation (min) 57 min   Activity Tolerance Patient tolerated treatment well   Behavior During Therapy [facility] for tasks assessed/performed      Past Medical History:  Diagnosis Date  . A-fib (HCC)   . Diabetes mellitus without complication (HCC)   . Hypertension     Past Surgical History:  Procedure Laterality Date  . arm surgery    . BACK SURGERY    . JOINT REPLACEMENT     hip  . JOINT REPLACEMENT Right    knee  . SPINE SURGERY      There were no vitals filed for this visit.      Subjective Assessment - 02/22/17 1016    Subjective  Did see the Dr yesterday -I can wean my splint from wrist - and then mallet one can take off - and can use tape - I can start more movement to wrist - I did not wear the wrist splint since yesterday - no pain was able to pick up coffee cup last time    Patient Stated Goals I want to get as much use of my L hand and arm as I could - I am L handed - and want to write and do soduko, eat , hygiene, dressing , ect    Currently in Pain? No/denies                      OT Treatments/Exercises (OP) - 02/22/17 0001      LUE Fluidotherapy   Number Minutes Fluidotherapy 10 Minutes   LUE Fluidotherapy Location Hand;Wrist   Comments AROM for wrist in all planes - AROM to increase ROM and decrease stiffness/pain       Pt coming in with silk tape on 3rd DIP - but flexion about 25 degrees or more  refabricate Mallet splint and pt to use non  flexible tape to keep proximal part on - can wean every 2 hrs - 30 min out of splint  For next few days  AROM tendonglides done - but do not force DIP flexion or composite Appear to stay in place and not slide -   fluido  done  - wrist AROM in all planes to increase ROM   AAROM over edge of table for wrist Flexion, ext, RD and UD   10 reps  AAROM sup and pro- 10 reps  Prior to AROM for wrist in all planes  Add to HEP  Pt can also do prayer stretch and wrist flexion - 3 x day 10 reps             OT Education - 02/22/17 1102    Education provided Yes   Education Details Order recieve to start AAROM to wrist - updated HEP and mallet splint weaning    Person(s) Educated Patient   Methods Explanation;Demonstration;Tactile cues;Verbal cues   Comprehension Verbal cues required;Returned demonstration;Verbalized understanding  OT Short Term Goals - 02/13/17 1800      OT SHORT TERM GOAL #1   Title Pt to be ind in HEP to increase AROM in digits and wrist - splint wearing for Mallet/wrist    Baseline no knowledge for HEP for digits and wrist    Time 2   Period Weeks   Status New     OT SHORT TERM GOAL #2   Title AROM in L 3rd digit improve for pt to touch palm without extention lag more than -10 degrees     Baseline -20 extention , Flexion 40 at DIP and PIP 70 -    Time 5   Period Weeks   Status New     OT SHORT TERM GOAL #3   Title Pain on PRWHE improve with 15 points    Baseline at eval pain on PRWHE 26/50   Time 4   Period Weeks   Status New     OT SHORT TERM GOAL #4   Title L wrist AROM improve for pt turn page, take change in palm, eat 50% with L hand    Baseline See flowsheet -    Time 54   Period Weeks   Status New           OT Long Term Goals - 02/13/17 1808      OT LONG TERM GOAL #1   Title L wrist AROM improve to more than 75% compare to R to use in feeding , pulling up pants, bathing    Baseline see flowsheet    Time 6   Period Weeks    Status New     OT LONG TERM GOAL #2   Title L thumb and digits AROM improve to [facility] for pt to do buttons, write , pull up pants, hold utencils    Baseline see flowsheet    Time 6   Period Weeks   Status New     OT LONG TERM GOAL #3   Title Assess grip and prehension strength when ordered by MD    Baseline NT yet   Time 4   Period Weeks   Status New     OT LONG TERM GOAL #4   Title Function score on PRWHE improve with more than 30 points    Baseline Function score on PRHWE at eval 50/50    Time 8   Period Weeks               Plan - 02/22/17 1103    Clinical Impression Statement Pt seen MD yesterday and reciev order for AAROM for wrist , pt out of wrist splint per pt - and no pain - pt do arrive with silk tape on Mallet finger but DIP flexed greatly - pt back  in new Mallet finger splint and to wear it all the time but start weaning 30 min at a time every 2 hrs - add AAROM  to wrist in all planes prior to AROM    Rehab Potential Good   OT Frequency 2x / week   OT Duration 6 weeks   OT Treatment/Interventions Self-care/ADL training;Fluidtherapy;Splinting;Patient/family education;Therapeutic exercises;Contrast Bath;Parrafin;Manual Therapy;Passive range of motion;Ultrasound   Plan assess progress with updated HEP and DIP extention - weaning of splint    OT Home Exercise Plan see pt instruction   Consulted and Agree with Plan of Care Patient      Patient will benefit from skilled therapeutic intervention in order to improve the following deficits  and impairments:  Decreased range of motion, Decreased coordination, Impaired flexibility, Decreased knowledge of precautions, Decreased strength, Impaired UE functional use, Pain, Decreased knowledge of use of DME, Decreased skin integrity  Visit Diagnosis: Stiffness of left hand, not elsewhere classified  Stiffness of left wrist, not elsewhere classified  Stiffness of left elbow, not elsewhere classified  Pain in left  arm  Muscle weakness (generalized)    Problem List There are no active problems to display for this patient.   Oletta CohnuPreez, Carlisha Wisler OTR/L,CLT 02/22/2017, 4:38 PM  [facility]IC[facility] [address] [place], [place], [address] Phone: [phone]   Fax:  [phone]  Name: Ian King MRN: [id number] Date of Birth: 10/28/1940

## 2017-02-22 NOTE — Patient Instructions (Addendum)
  refabricate Mallet splint and pt to use non flexible tape to keep proximal part on - can wean every 2 hrs - 30 min out of splint  For next few days  AROM tendonglides done - but do not force DIP flexion or composite Appear to stay in place and not slide -   AAROM over edge of table for wrist Flexion, ext, RD and UD   10 reps  AAROM sup and pro- 10 reps  Prior to AROM for wrist in all planes  Add to HEP  Pt can also do prayer stretch and wrist flexion - 3 x day 10 reps

## 2017-02-28 ENCOUNTER — Ambulatory Visit: Payer: Medicare Other | Admitting: Occupational Therapy

## 2017-02-28 DIAGNOSIS — M25632 Stiffness of left wrist, not elsewhere classified: Secondary | ICD-10-CM

## 2017-02-28 DIAGNOSIS — M25642 Stiffness of left hand, not elsewhere classified: Secondary | ICD-10-CM | POA: Diagnosis not present

## 2017-02-28 DIAGNOSIS — M6281 Muscle weakness (generalized): Secondary | ICD-10-CM

## 2017-02-28 DIAGNOSIS — M79602 Pain in left arm: Secondary | ICD-10-CM

## 2017-02-28 DIAGNOSIS — M25622 Stiffness of left elbow, not elsewhere classified: Secondary | ICD-10-CM

## 2017-02-28 NOTE — Therapy (Signed)
[facility] [address], [place], [address] Phone: [phone]   Fax:  [phone]  Occupational Therapy Treatment  Patient Details  Name: Ian King MRN: [id number] Date of Birth: 06/12/1941 Referring Provider: Eliezer Lofts  Encounter Date: 02/28/2017      OT End of Session - 02/28/17 1418    Visit Number 5   Number of Visits 16   Date for OT Re-Evaluation 04/10/17   OT Start Time 1200   OT Stop Time 1301   OT Time Calculation (min) 61 min   Activity Tolerance Patient tolerated treatment well   Behavior During Therapy [facility] for tasks assessed/performed      Past Medical History:  Diagnosis Date  . A-fib (HCC)   . Diabetes mellitus without complication (HCC)   . Hypertension     Past Surgical History:  Procedure Laterality Date  . arm surgery    . BACK SURGERY    . JOINT REPLACEMENT     hip  . JOINT REPLACEMENT Right    knee  . SPINE SURGERY      There were no vitals filed for this visit.      Subjective Assessment - 02/28/17 1203    Subjective  I tried to follow the exercises- was at beach this past weekend - did not use heat - trying to use it more - still trouble some trouble with eating - taking hearingaids out and pulling up pants    Patient Stated Goals I want to get as much use of my L hand and arm as I could - I am L handed - and want to write and do soduko, eat , hygiene, dressing , ect    Currently in Pain? No/denies            [facility] OT Assessment - 02/28/17 0001      AROM   Left Forearm Supination 74 Degrees   Left Wrist Extension 43 Degrees   Left Wrist Flexion 46 Degrees                  OT Treatments/Exercises (OP) - 02/28/17 0001      LUE Fluidotherapy   Number Minutes Fluidotherapy 10 Minutes   LUE Fluidotherapy Location Hand;Wrist   Comments at [facility] to increase ROM and decrease pain     Remove Mallet finger  Splint - pt able to maintain -10 DIP  extention - for about 20 min  refabricate Mallet splint long on dorsal middle phalanges  and pt to use non flexible tape to keep proximal part on - can wean every 2 hrs - 30 min out of splint  Wife present this date - pt did not do over the past weekend    AROM tendonglides done - but do not force DIP flexion or composite Appear to stay in place and not slide  Measured wrist AROM - some decrease compare to last time    fluido  done  - wrist AROM in all planes to increase ROM   AAROM over edge of table for wrist Flexion, ext, RD and UD   10 reps  AAROM sup and pro with elbow to side- 10 reps  Add sup/pro wheel for pt to use at home for sup -and add end range AAROM  Prior to AROM for wrist in all planes  Pt needed max A and mod v/c for HEP - wife present this dat   Pt can also do prayer stretch  and wrist flexion - 3 x day 10 reps             OT Education - 02/28/17 1417    Education provided Yes   Education Details HEP reviewed again - pt did not do all of them or correct   Person(s) Educated Patient;Spouse   Methods Explanation;Demonstration;Tactile cues;Verbal cues;Handout   Comprehension Verbal cues required;Returned demonstration;Verbalized understanding          OT Short Term Goals - 02/13/17 1800      OT SHORT TERM GOAL #1   Title Pt to be ind in HEP to increase AROM in digits and wrist - splint wearing for Mallet/wrist    Baseline no knowledge for HEP for digits and wrist    Time 2   Period Weeks   Status New     OT SHORT TERM GOAL #2   Title AROM in L 3rd digit improve for pt to touch palm without extention lag more than -10 degrees     Baseline -20 extention , Flexion 40 at DIP and PIP 70 -    Time 5   Period Weeks   Status New     OT SHORT TERM GOAL #3   Title Pain on PRWHE improve with 15 points    Baseline at eval pain on PRWHE 26/50   Time 4   Period Weeks   Status New     OT SHORT TERM GOAL #4   Title L wrist AROM improve for pt turn page,  take change in palm, eat 50% with L hand    Baseline See flowsheet -    Time 54   Period Weeks   Status New           OT Long Term Goals - 02/13/17 1808      OT LONG TERM GOAL #1   Title L wrist AROM improve to more than 75% compare to R to use in feeding , pulling up pants, bathing    Baseline see flowsheet    Time 6   Period Weeks   Status New     OT LONG TERM GOAL #2   Title L thumb and digits AROM improve to [facility] for pt to do buttons, write , pull up pants, hold utencils    Baseline see flowsheet    Time 6   Period Weeks   Status New     OT LONG TERM GOAL #3   Title Assess grip and prehension strength when ordered by MD    Baseline NT yet   Time 4   Period Weeks   Status New     OT LONG TERM GOAL #4   Title Function score on PRWHE improve with more than 30 points    Baseline Function score on PRHWE at eval 50/50    Time 8   Period Weeks               Plan - 02/28/17 1418    Clinical Impression Statement Pt was out of town over weekend and wife was not present during review of new HEP last time -pt did not show progress at great - review again - and sup/pro wheel provided for HEP -and longer splint on dorsal side of  3rd for Mallet    Occupational performance deficits (Please refer to evaluation for details): ADL's;IADL's;Play;Leisure   Rehab Potential Good   OT Frequency 2x / week   OT Duration 4 weeks   OT Treatment/Interventions Self-care/ADL training;Fluidtherapy;Splinting;Patient/family education;Therapeutic  exercises;Contrast Bath;Parrafin;Manual Therapy;Passive range of motion;Ultrasound   OT Home Exercise Plan see pt instruction   Consulted and Agree with Plan of Care Patient   Plan assess progress with HEP - and splint use for DIP       Patient will benefit from skilled therapeutic intervention in order to improve the following deficits and impairments:  Decreased range of motion, Decreased coordination, Impaired flexibility, Decreased  knowledge of precautions, Decreased strength, Impaired UE functional use, Pain, Decreased knowledge of use of DME, Decreased skin integrity  Visit Diagnosis: Stiffness of left wrist, not elsewhere classified  Stiffness of left hand, not elsewhere classified  Stiffness of left elbow, not elsewhere classified  Pain in left arm  Muscle weakness (generalized)    Problem List There are no active problems to display for this patient.   Oletta CohnuPreez, Godwin Tedesco OTR/L,CLT 02/28/2017, 2:21 PM  [facility] [address] [place], [place], [address] Phone: [phone]   Fax:  [phone]  Name: Ian King MRN: [id number] Date of Birth: 01/19/1941

## 2017-02-28 NOTE — Patient Instructions (Addendum)
Same HEP as last time   but add sup/pro wheel for AROM and AAROM at end

## 2017-03-02 ENCOUNTER — Ambulatory Visit: Payer: Medicare Other | Admitting: Occupational Therapy

## 2017-03-02 DIAGNOSIS — M25632 Stiffness of left wrist, not elsewhere classified: Secondary | ICD-10-CM

## 2017-03-02 DIAGNOSIS — M6281 Muscle weakness (generalized): Secondary | ICD-10-CM

## 2017-03-02 DIAGNOSIS — M25642 Stiffness of left hand, not elsewhere classified: Secondary | ICD-10-CM

## 2017-03-02 DIAGNOSIS — M25622 Stiffness of left elbow, not elsewhere classified: Secondary | ICD-10-CM

## 2017-03-02 DIAGNOSIS — M79602 Pain in left arm: Secondary | ICD-10-CM

## 2017-03-02 NOTE — Patient Instructions (Signed)
Pt to cont wearing Mallet splint - can wean every 2 hrs - 30 min out of splint  - but add tendon glides - 10 reps   Cont with  sup/pro wheel  10 reps pt to cont at home and  Same HEP for wrist as before

## 2017-03-02 NOTE — Therapy (Signed)
[facility] [address] [place], [address] Phone: [phone]   Fax:  [phone]  Occupational Therapy Treatment  Patient Details  Name: Ian King MRN: [id number] Date of Birth: 08-06-1941 Referring Provider: Eliezer Lofts  Encounter Date: 03/02/2017      OT End of Session - 03/02/17 1832    Visit Number 6   Number of Visits 16   Date for OT Re-Evaluation 04/10/17   OT Start Time 1255   OT Stop Time 1340   OT Time Calculation (min) 45 min   Activity Tolerance Patient tolerated treatment well   Behavior During Therapy [facility] for tasks assessed/performed      Past Medical History:  Diagnosis Date  . A-fib (HCC)   . Diabetes mellitus without complication (HCC)   . Hypertension     Past Surgical History:  Procedure Laterality Date  . arm surgery    . BACK SURGERY    . JOINT REPLACEMENT     hip  . JOINT REPLACEMENT Right    knee  . SPINE SURGERY      There were no vitals filed for this visit.      Subjective Assessment - 03/02/17 1334    Subjective  Was little sore after last time - finger splint you took off for about 3 x day - using it more -tryingt to eat , pick up things more with L    Patient Stated Goals I want to get as much use of my L hand and arm as I could - I am L handed - and want to write and do soduko, eat , hygiene, dressing , ect    Currently in Pain? No/denies            [facility] OT Assessment - 03/02/17 0001      AROM   Left Wrist Extension 40 Degrees                  OT Treatments/Exercises (OP) - 03/02/17 0001      LUE Fluidotherapy   Number Minutes Fluidotherapy 10 Minutes   LUE Fluidotherapy Location Hand;Wrist   Comments at [facility] to increase ROM and decrease pain       Remove Mallet finger  Splint - pt able to maintain -5 DIP extention -  Fluido therapy done  Tendonglides and wrist AROM in all planes  DIP extention to -10  Pt to cont  wearing Mallet splint - can wean every 2 hrs - 30 min out of splint  - but add tendon glides - 10 reps     AROM tendonglides done -pt reed on doing correctly - need mod A    PROM for sup and pronation done  Followed by sup/pro wheel  10 reps pt to cont at home and  Same HEP for wrist as before   BTE CPM for wrist extention done 2 x 200 sec  Great progress  BTE CPM for wrist flexion 1 x 200 sec  Great progress            OT Education - 03/02/17 1832    Education provided Yes   Education Details Same HEP but can start back 10 reps Tendon glides every 2 hrs    Person(s) Educated Patient;Spouse   Methods Explanation;Demonstration;Tactile cues;Verbal cues;Handout   Comprehension Verbal cues required;Returned demonstration;Verbalized understanding          OT Short Term Goals - 02/13/17 1800  OT SHORT TERM GOAL #1   Title Pt to be ind in HEP to increase AROM in digits and wrist - splint wearing for Mallet/wrist    Baseline no knowledge for HEP for digits and wrist    Time 2   Period Weeks   Status New     OT SHORT TERM GOAL #2   Title AROM in L 3rd digit improve for pt to touch palm without extention lag more than -10 degrees     Baseline -20 extention , Flexion 40 at DIP and PIP 70 -    Time 5   Period Weeks   Status New     OT SHORT TERM GOAL #3   Title Pain on PRWHE improve with 15 points    Baseline at eval pain on PRWHE 26/50   Time 4   Period Weeks   Status New     OT SHORT TERM GOAL #4   Title L wrist AROM improve for pt turn page, take change in palm, eat 50% with L hand    Baseline See flowsheet -    Time 54   Period Weeks   Status New           OT Long Term Goals - 02/13/17 1808      OT LONG TERM GOAL #1   Title L wrist AROM improve to more than 75% compare to R to use in feeding , pulling up pants, bathing    Baseline see flowsheet    Time 6   Period Weeks   Status New     OT LONG TERM GOAL #2   Title L thumb and digits [facility]OM  [facility] pt to do buttons, write , pull up pants, hold utencils    Baseline see flowsheet    Time 6   Period Weeks   Status New     OT LONG TERM GOAL #3   Title Assess grip and prehension strength when ordered by MD    Baseline NT yet   Time 4   Period Weeks   Status New     OT LONG TERM GOAL #4   Title Function score on PRWHE improve with more than 30 points    Baseline Function score on PRHWE at eval 50/50    Time 8   Period Weeks               Plan - 03/02/17 1833    Clinical Impression Statement Pt made some great progress in sup /pro , wrist flexion - during session with wrist extention after CPM great progress -DIP of 3rd appear to stay in about -5 to -15 during sesson -still slowly weaning out of splint    Occupational performance deficits (Please refer to evaluation for details): ADL's;IADL's;Play;Leisure   Rehab Potential Good   OT Frequency 2x / week   OT Duration 4 weeks   OT Treatment/Interventions Self-care/ADL training;Fluidtherapy;Splinting;Patient/family education;Therapeutic exercises;Contrast Bath;Parrafin;Manual Therapy;Passive range of motion;Ultrasound   Plan update HEP and assess progress   OT Home Exercise Plan see pt instruction   Consulted and Agree with Plan of Care Patient      Patient will benefit from skilled therapeutic intervention in order to improve the following deficits and impairments:  Decreased range of motion, Decreased coordination, Impaired flexibility, Decreased knowledge of precautions, Decreased strength, Impaired UE functional use, Pain, Decreased knowledge of use of DME, Decreased skin integrity  Visit Diagnosis: Stiffness of left wrist, not elsewhere classified  Stiffness of  left hand, not elsewhere classified  Stiffness of left elbow, not elsewhere classified  Pain in left arm  Muscle weakness (generalized)    Problem List There are no active problems to display for this patient.   Oletta Cohn  OTR/L,CLT 03/02/2017, 6:35 PM  [facility] SPORTS MEDICINE [address] [place], [address] Phone: [phone]   Fax:  [phone]  Name: Ian King MRN: [id number] Date of Birth: 1941/03/23

## 2017-03-07 ENCOUNTER — Ambulatory Visit: Payer: Medicare Other | Attending: Orthopedic Surgery | Admitting: Occupational Therapy

## 2017-03-07 DIAGNOSIS — M25632 Stiffness of left wrist, not elsewhere classified: Secondary | ICD-10-CM | POA: Diagnosis present

## 2017-03-07 DIAGNOSIS — M25642 Stiffness of left hand, not elsewhere classified: Secondary | ICD-10-CM | POA: Insufficient documentation

## 2017-03-07 DIAGNOSIS — M25622 Stiffness of left elbow, not elsewhere classified: Secondary | ICD-10-CM | POA: Insufficient documentation

## 2017-03-07 DIAGNOSIS — R2681 Unsteadiness on feet: Secondary | ICD-10-CM | POA: Insufficient documentation

## 2017-03-07 DIAGNOSIS — M6281 Muscle weakness (generalized): Secondary | ICD-10-CM | POA: Diagnosis present

## 2017-03-07 DIAGNOSIS — M79602 Pain in left arm: Secondary | ICD-10-CM | POA: Diagnosis present

## 2017-03-07 NOTE — Patient Instructions (Addendum)
Pt to cont wearing Mallet splint - can wean every 2 hrs - 30 min out of splint  - but cont tendon glides - 10 reps   same HEP

## 2017-03-07 NOTE — Therapy (Signed)
[facility] [address], [place], [address] Phone: [phone]   Fax:  [phone]  Occupational Therapy Treatment  Patient Details  Name: Ian King MRN: [id number] Date of Birth: April 24, 1941 Referring Provider: Eliezer Lofts  Encounter Date: 03/07/2017      OT End of Session - 03/07/17 1851    Visit Number 7   Number of Visits 16   Date for OT Re-Evaluation 04/10/17   OT Start Time 1450   OT Stop Time 1545   OT Time Calculation (min) 55 min   Activity Tolerance Patient tolerated treatment well   Behavior During Therapy [facility] for tasks assessed/performed      Past Medical History:  Diagnosis Date  . A-fib (HCC)   . Diabetes mellitus without complication (HCC)   . Hypertension     Past Surgical History:  Procedure Laterality Date  . arm surgery    . BACK SURGERY    . JOINT REPLACEMENT     hip  . JOINT REPLACEMENT Right    knee  . SPINE SURGERY      There were no vitals filed for this visit.      Subjective Assessment - 03/07/17 1841    Subjective  I'm have appt with neck Dr tomorrow - looking so forward getting this neck brace off - I did like you told me work on the finger every 2 hrs for - I think it looks good - and then my wrist was really sore after last times workout    Patient Stated Goals I want to get as much use of my L hand and arm as I could - I am L handed - and want to write and do soduko, eat , hygiene, dressing , ect    Currently in Pain? Yes   Pain Score 1    Pain Location Wrist   Pain Orientation Left   Pain Descriptors / Indicators Aching   Pain Type Acute pain   Pain Onset In the past 7 days   Pain Frequency Intermittent  with ROM             [facility] OT Assessment - 03/07/17 0001      AROM   Left Wrist Extension 46 Degrees   Left Wrist Flexion 50 Degrees                  OT Treatments/Exercises (OP) - 03/07/17 0001      LUE Fluidotherapy    Number Minutes Fluidotherapy 10 Minutes   LUE Fluidotherapy Location Hand;Wrist   Comments  SOC to increase wrist ROM , and decrease pain and stiffness - increase ROM in 3rd digit    Remove Mallet finger Splint - pt able to maintain -10 DIP extention -  Fluido therapy done  Tendonglides and wrist AROM in all planes  DIP extention to -10 still  Pt to cont wearing Mallet splint - can wean every 2 hrs - 30 min out of splint  - but cont tendon glides - 10 reps    AROM tendonglides done -pt reed on doing correctly - need min  A  Graston tool nr 2 done on volar wrist and forearm prior to ROM - sweeping to increase ROM  And decrease stiffness    PROM for sup and place and hold  BTE CPM for wrist extention done 2 x 200 sec  With only slight pull this time  BTE CPM for wrist flexion 1  x 200 sec  Supination done place and hold AAROM - 10 reps and then AROM - on BTE but no resistance  120 sec  No pain per pt  DIP extention at end of session -20  Pt to cont with  Same HEP               OT Education - 03/07/17 1846    Education provided Yes   Education Details HEP and splint weaning -    Person(s) Educated Patient;Spouse   Methods Explanation;Demonstration;Tactile cues;Verbal cues   Comprehension Verbal cues required;Returned demonstration;Verbalized understanding          OT Short Term Goals - 02/13/17 1800      OT SHORT TERM GOAL #1   Title Pt to be ind in HEP to increase AROM in digits and wrist - splint wearing for Mallet/wrist    Baseline no knowledge for HEP for digits and wrist    Time 2   Period Weeks   Status New     OT SHORT TERM GOAL #2   Title AROM in L 3rd digit improve for pt to touch palm without extention lag more than -10 degrees     Baseline -20 extention , Flexion 40 at DIP and PIP 70 -    Time 5   Period Weeks   Status New     OT SHORT TERM GOAL #3   Title Pain on PRWHE improve with 15 points    Baseline at eval pain on PRWHE 26/50    Time 4   Period Weeks   Status New     OT SHORT TERM GOAL #4   Title L wrist AROM improve for pt turn page, take change in palm, eat 50% with L hand    Baseline See flowsheet -    Time 54   Period Weeks   Status New           OT Long Term Goals - 02/13/17 1808      OT LONG TERM GOAL #1   Title L wrist AROM improve to more than 75% compare to R to use in feeding , pulling up pants, bathing    Baseline see flowsheet    Time 6   Period Weeks   Status New     OT LONG TERM GOAL #2   Title L thumb and digits AROM improve to [facility] for pt to do buttons, write , pull up pants, hold utencils    Baseline see flowsheet    Time 6   Period Weeks   Status New     OT LONG TERM GOAL #3   Title Assess grip and prehension strength when ordered by MD    Baseline NT yet   Time 4   Period Weeks   Status New     OT LONG TERM GOAL #4   Title Function score on PRWHE improve with more than 30 points    Baseline Function score on PRHWE at eval 50/50    Time 8   Period Weeks               Plan - 03/07/17 1851    Clinical Impression Statement Pt making progress  in ROM at wrist - pt now 11 wks - last order was for weaning and AAROM - will contact Dr Eliezer Lofts about strengthening for wrist - pt report using it more - had some pain after last time on BTE CPM - reinforce for pt to keep pain  about 1/10 - DIP extention with use on BTE got to -20 but coming in -10    Occupational performance deficits (Please refer to evaluation for details): ADL's;IADL's;Play;Leisure   Rehab Potential Good   OT Frequency 2x / week   OT Duration 4 weeks   OT Treatment/Interventions Self-care/ADL training;Fluidtherapy;Splinting;Patient/family education;Therapeutic exercises;Contrast Bath;Parrafin;Manual Therapy;Passive range of motion;Ultrasound   Plan cont MD about strenghtening    OT Home Exercise Plan see pt instruction   Consulted and Agree with Plan of Care Patient      Patient will benefit from  skilled therapeutic intervention in order to improve the following deficits and impairments:  Decreased range of motion, Decreased coordination, Impaired flexibility, Decreased knowledge of precautions, Decreased strength, Impaired UE functional use, Pain, Decreased knowledge of use of DME, Decreased skin integrity  Visit Diagnosis: Stiffness of left wrist, not elsewhere classified  Stiffness of left hand, not elsewhere classified  Stiffness of left elbow, not elsewhere classified  Pain in left arm  Muscle weakness (generalized)    Problem List There are no active problems to display for this patient.   Oletta CohnuPreez, Nahomi Hegner OTR/L,CLT 03/07/2017, 6:55 PM  [facility] [address] [place], [place], [address] Phone: [phone]   Fax:  [phone]  Name: Ian King MRN: [id number] Date of Birth: 09/07/1941

## 2017-03-08 DIAGNOSIS — S62633A Displaced fracture of distal phalanx of left middle finger, initial encounter for closed fracture: Secondary | ICD-10-CM | POA: Insufficient documentation

## 2017-03-09 ENCOUNTER — Ambulatory Visit: Payer: Medicare Other | Admitting: Occupational Therapy

## 2017-03-09 DIAGNOSIS — M79602 Pain in left arm: Secondary | ICD-10-CM

## 2017-03-09 DIAGNOSIS — M25622 Stiffness of left elbow, not elsewhere classified: Secondary | ICD-10-CM

## 2017-03-09 DIAGNOSIS — M6281 Muscle weakness (generalized): Secondary | ICD-10-CM

## 2017-03-09 DIAGNOSIS — M25642 Stiffness of left hand, not elsewhere classified: Secondary | ICD-10-CM

## 2017-03-09 DIAGNOSIS — M25632 Stiffness of left wrist, not elsewhere classified: Secondary | ICD-10-CM

## 2017-03-09 NOTE — Therapy (Signed)
[facility] [address] [place], [address] Phone: [phone]   Fax:  [phone]  Occupational Therapy Treatment  Patient Details  Name: Ian King MRN: [id number] Date of Birth: [birth year] Referring Provider: Eliezer Lofts  Encounter Date: 03/09/2017      OT End of Session - 03/09/17 1242    Visit Number 8   Number of Visits 16   Date for OT Re-Evaluation 04/10/17   OT Start Time 1015   OT Stop Time 1100   OT Time Calculation (min) 45 min   Activity Tolerance Patient tolerated treatment well   Behavior During Therapy [facility] for tasks assessed/performed      Past Medical History:  Diagnosis Date  . A-fib (HCC)   . Diabetes mellitus without complication (HCC)   . Hypertension     Past Surgical History:  Procedure Laterality Date  . arm surgery    . BACK SURGERY    . JOINT REPLACEMENT     hip  . JOINT REPLACEMENT Right    knee  . SPINE SURGERY      There were no vitals filed for this visit.      Subjective Assessment - 03/09/17 1032    Subjective  Doing okay- my neck brace off yesterday - about the same ROM wise than before I fell - neck stiff but no pain - did not had as much time to work my arm yesterday - follow up with wrist MD 2nd week in July    Patient Stated Goals I want to get as much use of my L hand and arm as I could - I am L handed - and want to write and do soduko, eat , hygiene, dressing , ect    Currently in Pain? No/denies                      OT Treatments/Exercises (OP) - 03/09/17 0001      LUE Fluidotherapy   Number Minutes Fluidotherapy 10 Minutes   LUE Fluidotherapy Location Hand;Wrist   Comments at [facility] to increase ROM  in digits and wrist       Remove Mallet finger Splint - pt able to maintain -8 DIP extention -  Fluido therapy done  Tendonglides and wrist AROM in all planes  DIP extention to -10 still  Pt to cont wearing Mallet splint - can  wean to 4 x 1 hour a day off    can do tendon glides and place and hold DIP extention  Graston tool nr 2 done on volar wrist and forearm prior to ROM - sweeping to increase ROM  And decrease stiffness with wrist AROM    PROM for sup/pronation by OT  16 oz hammer for sup /pro done - RD and UD PROM over edge of table  16oz hammer for RD ,UD over edge of table   10 reps no increase pain   BTE CPM for wrist extention done 2 x 200 sec  With only slight pull  16 oz hammer for wrist extention and flexion done 10 reps  ADD to HEP  Reinforce for pt to use heat prior to stretches and PROM  DIP extention at end of session -15               OT Education - 03/09/17 1241    Education provided Yes   Education Details HEP upgrade to gentle strengthening  for wrist pain free  Person(s) Educated Patient   Methods Explanation;Demonstration;Tactile cues;Verbal cues;Handout   Comprehension Verbal cues required;Returned demonstration;Verbalized understanding          OT Short Term Goals - 02/13/17 1800      OT SHORT TERM GOAL #1   Title Pt to be ind in HEP to increase AROM in digits and wrist - splint wearing for Mallet/wrist    Baseline no knowledge for HEP for digits and wrist    Time 2   Period Weeks   Status New     OT SHORT TERM GOAL #2   Title AROM in L 3rd digit improve for pt to touch palm without extention lag more than -10 degrees     Baseline -20 extention , Flexion 40 at DIP and PIP 70 -    Time 5   Period Weeks   Status New     OT SHORT TERM GOAL #3   Title Pain on PRWHE improve with 15 points    Baseline at eval pain on PRWHE 26/50   Time 4   Period Weeks   Status New     OT SHORT TERM GOAL #4   Title L wrist AROM improve for pt turn page, take change in palm, eat 50% with L hand    Baseline See flowsheet -    Time 54   Period Weeks   Status New           OT Long Term Goals - 02/13/17 1808      OT LONG TERM GOAL #1   Title L wrist AROM  improve to more than 75% compare to R to use in feeding , pulling up pants, bathing    Baseline see flowsheet    Time 6   Period Weeks   Status New     OT LONG TERM GOAL #2   Title L thumb and digits AROM improve to [facility] for pt to do buttons, write , pull up pants, hold utencils    Baseline see flowsheet    Time 6   Period Weeks   Status New     OT LONG TERM GOAL #3   Title Assess grip and prehension strength when ordered by MD    Baseline NT yet   Time 4   Period Weeks   Status New     OT LONG TERM GOAL #4   Title Function score on PRWHE improve with more than 30 points    Baseline Function score on PRHWE at eval 50/50    Time 8   Period Weeks               Plan - 03/09/17 1242    Clinical Impression Statement Pt making progress in functional use of L hand , very little pain with use -able to intiated strengthening gradually this date - pt more than 10 wks out from injury on 12/19/16 - pt able to tolerate 1lbs without increaes pain - pt stilll limited by sup /pro , extention more than flexion at wrist - and then Mallet finger pt can take off splint 4 x 1 hour during day    Occupational performance deficits (Please refer to evaluation for details): ADL's;IADL's;Leisure;Play   Rehab Potential Good   OT Frequency 2x / week   OT Duration 4 weeks   OT Treatment/Interventions Self-care/ADL training;Fluidtherapy;Splinting;Patient/family education;Therapeutic exercises;Contrast Bath;Parrafin;Manual Therapy;Passive range of motion;Ultrasound   Plan pt 10 wks out from injury - left message at [facility]- assess how did with HEP  OT Home Exercise Plan see pt instruction   Consulted and Agree with Plan of Care Patient      Patient will benefit from skilled therapeutic intervention in order to improve the following deficits and impairments:  Decreased range of motion, Decreased coordination, Impaired flexibility, Decreased knowledge of precautions, Decreased strength, Impaired UE  functional use, Pain, Decreased knowledge of use of DME, Decreased skin integrity  Visit Diagnosis: Stiffness of left wrist, not elsewhere classified  Stiffness of left hand, not elsewhere classified  Stiffness of left elbow, not elsewhere classified  Pain in left arm  Muscle weakness (generalized)    Problem List There are no active problems to display for this patient.   Oletta CohnuPreez, Esmay Amspacher OTR/L,CLT 03/09/2017, 12:50 PM  [facility] [address] [place], [place], [address] Phone: [phone]   Fax:  [phone]  Name: Ian GlassLarry L King MRN: [id number] Date of Birth: 04/22/1941

## 2017-03-09 NOTE — Patient Instructions (Signed)
Pt to cont wearing Mallet splint - can wean to 4 x 1 hour a day off    do tendon glides and place and hold DIP extention  Add  16 oz hammer for sup /pro  16oz hammer for RD ,UD over edge of table  16 oz hammer for wrist extention and flexion done 10 reps  ADD to HEP  Reinforce for pt to use heat prior to stretches and PROM

## 2017-03-13 ENCOUNTER — Ambulatory Visit: Payer: Medicare Other | Admitting: Occupational Therapy

## 2017-03-13 DIAGNOSIS — M79602 Pain in left arm: Secondary | ICD-10-CM

## 2017-03-13 DIAGNOSIS — M25622 Stiffness of left elbow, not elsewhere classified: Secondary | ICD-10-CM

## 2017-03-13 DIAGNOSIS — M25632 Stiffness of left wrist, not elsewhere classified: Secondary | ICD-10-CM

## 2017-03-13 DIAGNOSIS — M25642 Stiffness of left hand, not elsewhere classified: Secondary | ICD-10-CM

## 2017-03-13 DIAGNOSIS — M6281 Muscle weakness (generalized): Secondary | ICD-10-CM

## 2017-03-13 NOTE — Patient Instructions (Signed)
Same HEP but   Pt to cont wearing Mallet splint - but 2 hrs on and off      PROM  Reinforce for pt to do for pronation, wrist extention and flexion  Demo by OT    16 oz hammer for sup /pro  16oz hammer for RD ,UD over edge of table /armrest 16 oz hammer for wrist extention and flexion done 10 reps Review - pt only did sup/pro  Forgot others  Needed Mod A    Reinforce for pt to use heat prior to stretches and PROM

## 2017-03-13 NOTE — Therapy (Signed)
[facility] [address] [place], [place], [address] Phone: [phone]   Fax:  [phone]  Occupational Therapy Treatment  Patient Details  Name: Ian King MRN: [id number] Date of Birth: 11/21/1940 Referring Provider: Eliezer LoftsLeversedge  Encounter Date: 03/13/2017      OT End of Session - 03/13/17 1801    Visit Number 9   Number of Visits 16   Date for OT Re-Evaluation 04/10/17   OT Start Time 1201   OT Stop Time 1258   OT Time Calculation (min) 57 min   Activity Tolerance Patient tolerated treatment well   Behavior [facility] for tasks assessed/performed      Past Medical History:  Diagnosis Date  . A-fib (HCC)   . Diabetes mellitus without complication (HCC)   . Hypertension     Past Surgical History:  Procedure Laterality Date  . arm surgery    . BACK SURGERY    . JOINT REPLACEMENT     hip  . JOINT REPLACEMENT Right    knee  . SPINE SURGERY      There were no vitals filed for this visit.      Subjective Assessment - 03/13/17 1244    Subjective  I did the exercises - used a 16oz hammer at home - I try and use it more - but the middle finger splint is in the way - do not really have any pain    Patient Stated Goals I want to get as much use of my L hand and arm as I could - I am L handed - and want to write and do soduko, eat , hygiene, dressing , ect    Currently in Pain? No/denies            [facility] - 03/13/17 0001      AROM   Left Wrist Extension 50 Degrees   Left Wrist Flexion 57 Degrees                  OT Treatments/Exercises (OP) - 03/13/17 0001      LUE Fluidotherapy   Number Minutes Fluidotherapy 10 Minutes   LUE Fluidotherapy Location Hand;Wrist     Remove Mallet finger Splint - pt able to maintain -8 DIP extention -  Fluido therapy done  Tendonglides and wrist AROM in all planes  DIP extention to -13 still  Same HEP but   Pt to cont  wearing Mallet splint - but 2 hrs on and off      PROM  Reinforce for pt to do for pronation, wrist extention and flexion  Demo by OT   CPM for wrist extention 200 sec , and repeat 100 sec prior to strengthening  CPM for wrist flexion 200 sec      16 oz hammer for sup /pro  16oz hammer for RD ,UD over edge of table /armrest 16 oz hammer for wrist extention and flexion done 10 reps Review - pt only did sup/pro  Forgot others  Needed Mod A    Reinforce for pt to use heat prior to stretches and PROM    Same HEP but   Pt to cont wearing Mallet splint - but 2 hrs on and off                OT Education - 03/13/17 1800    Education provided Yes   Education Details finding and Mallet finger splint on and off every  2 hrs    Person(s) Educated Patient   Methods Demonstration;Tactile cues;Verbal cues;Explanation   Comprehension Verbalized understanding;Returned demonstration;Verbal cues required          OT Short Term Goals - 02/13/17 1800      OT SHORT TERM GOAL #1   Title Pt to be ind in HEP to increase AROM in digits and wrist - splint wearing for Mallet/wrist    Baseline no knowledge for HEP for digits and wrist    Time 2   Period Weeks   Status New     OT SHORT TERM GOAL #2   Title AROM in L 3rd digit improve for pt to touch palm without extention lag more than -10 degrees     Baseline -20 extention , Flexion 40 at DIP and PIP 70 -    Time 5   Period Weeks   Status New     OT SHORT TERM GOAL #3   Title Pain on PRWHE improve with 15 points    Baseline at eval pain on PRWHE 26/50   Time 4   Period Weeks   Status New     OT SHORT TERM GOAL #4   Title L wrist AROM improve for pt turn page, take change in palm, eat 50% with L hand    Baseline See flowsheet -    Time 54   Period Weeks   Status New           OT Long Term Goals - 02/13/17 1808      OT LONG TERM GOAL #1   Title L wrist AROM improve to more than 75% compare to R to use in feeding  , pulling up pants, bathing    Baseline see flowsheet    Time 6   Period Weeks   Status New     OT LONG TERM GOAL #2   Title L thumb and digits AROM improve to [facility] for pt to do buttons, write , pull up pants, hold utencils    Baseline see flowsheet    Time 6   Period Weeks   Status New     OT LONG TERM GOAL #3   Title Assess grip and prehension strength when ordered by MD    Baseline NT yet   Time 4   Period Weeks   Status New     OT LONG TERM GOAL #4   Title Function score on PRWHE improve with more than 30 points    Baseline Function score on PRHWE at eval 50/50    Time 8   Period Weeks               Plan - 03/13/17 1802    Clinical Impression Statement Pt cont to make progress in wrist ROM , strength and pain -and extention of Maller finger - pt to wean out of splint 2 hrs on and off this week - and cont with 1 lbs for wrist - pt to let pain be his guide for picking up but should not be more than 2 lbs    Occupational performance deficits (Please refer to evaluation for details): ADL's;IADL's;Leisure   Rehab Potential Good   OT Frequency 2x / week   OT Duration 4 weeks   Plan Pt more than 10 wks out from injury - tolerating strenthening with no increase pain --assess how did with HEP    Clinical Decision Making Several treatment options, min-mod task modification necessary   OT Home Exercise Plan  see pt instruction   Consulted and Agree with Plan of Care Patient      Patient will benefit from skilled therapeutic intervention in order to improve the following deficits and impairments:  Decreased range of motion, Decreased coordination, Impaired flexibility, Decreased knowledge of precautions, Decreased strength, Impaired UE functional use, Pain, Decreased knowledge of use of DME, Decreased skin integrity  Visit Diagnosis: Stiffness of left wrist, not elsewhere classified  Stiffness of left hand, not elsewhere classified  Stiffness of left elbow, not elsewhere  classified  Pain in left arm  Muscle weakness (generalized)    Problem List There are no active problems to display for this patient.   Oletta Cohn OTR/L,CLT 03/13/2017, 6:11 PM  [facility] [address], [place], [address] Phone: [phone]   Fax:  [phone]  Name: Ian King MRN: [id number] Date of Birth: 1941/04/29

## 2017-03-16 ENCOUNTER — Ambulatory Visit: Payer: Medicare Other

## 2017-03-16 ENCOUNTER — Ambulatory Visit: Payer: Medicare Other | Admitting: Occupational Therapy

## 2017-03-16 ENCOUNTER — Ambulatory Visit: Payer: Medicare Other | Admitting: Physical Therapy

## 2017-03-16 DIAGNOSIS — M25632 Stiffness of left wrist, not elsewhere classified: Secondary | ICD-10-CM

## 2017-03-16 DIAGNOSIS — M6281 Muscle weakness (generalized): Secondary | ICD-10-CM

## 2017-03-16 DIAGNOSIS — M25622 Stiffness of left elbow, not elsewhere classified: Secondary | ICD-10-CM

## 2017-03-16 DIAGNOSIS — M25642 Stiffness of left hand, not elsewhere classified: Secondary | ICD-10-CM

## 2017-03-16 DIAGNOSIS — R2681 Unsteadiness on feet: Secondary | ICD-10-CM

## 2017-03-16 DIAGNOSIS — M79602 Pain in left arm: Secondary | ICD-10-CM

## 2017-03-16 NOTE — Patient Instructions (Addendum)
  Fabricated volar gutter splint for DIP and PIP - to wear at night time and 2 hrs in middle of day   rest of time off with finger splint    Redone HEP for PROM and stretches with pt and wife for wrist in all planes  With new handout  PROM for wrist flexion , ext, RD and UD - as well as sup and pronatoin 10 reps 2 x day after heat at home   16 oz hammer for sup /pro  16oz hammer for RD ,UD unsupported both  16 oz hammer for wrist extention and flexion done 10 reps over edge of armrest Pt to do 2 x day 10 reps  And can increase to 2 sets 2 x day in 3-4 days if no increase pain  Assess if pt can do 2 lbs - did not had pain - if want can do 2 lbs for sup/pro , RD and UD   but no wrist extention    Reinforce for pt to use heat prior to stretches and PROM

## 2017-03-16 NOTE — Therapy (Signed)
[facility] [address] [place], [address] Phone: [phone]   Fax:  [phone]  Physical Therapy PT Screen  Patient Details  Name: Ian King MRN: [id number] Date of Birth: [birth year] No Data Recorded  Encounter Date: 03/16/2017      PT End of Session - 03/16/17 1133    Visit Number 1   Number of Visits 1   PT Start Time 1133  pt arrived late   PT Stop Time 1223   PT Time Calculation (min) 50 min   Equipment Utilized During Treatment Gait belt  Pt SPC   Activity Tolerance Patient tolerated treatment well   Behavior During Therapy [facility] for tasks assessed/performed      Past Medical History:  Diagnosis Date  . A-fib (HCC)   . Diabetes mellitus without complication (HCC)   . Hypertension     Past Surgical History:  Procedure Laterality Date  . arm surgery    . BACK SURGERY    . JOINT REPLACEMENT     hip  . JOINT REPLACEMENT Right    knee  . SPINE SURGERY      There were no vitals filed for this visit.      Subjective Assessment - 03/16/17 1135    Pertinent History Balance Screen. Pt came out of the hospital and went to [facility] nursing home and rehab. Pt had neck and back surgery fusion and wore a back brace and neck collar 24/7.  Eventually was able to get his back brace removed when sleeping but had to wear his brace when out of bed.  He spent most of his time on the wc or on the bed.  Used a platform walker 1x while at the nursing home. Eventually started walking. Used a wide based quad cane initially.  One day pt walked without an AD with the PT.  Pt states that he thought he did pretty good.   No falls since December 19, 2016.  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  Pt currently using a SPC in both hands.  Has 1 lb restriction for his L hand.   Pt states that the doctor does not think he needs the [facility] Pt states that the doctor did not see him walk into or out of the clinic. Did not  see him walk.   Pt states that his back and neck are healing.   The doctor did not recommend PT for back or neck.              [facility] PT Assessment - 03/16/17 1147      Observation/Other Assessments   Observations TUG without AD: 12 seconds, 11 seconds, 11.19 seconds (11.4 seconds average)     Ambulation/Gait   Gait Comments forward flexed, decreased foot clearance L foot at times. SPC on R side.  Forward flexed, decreased stance L LE, R pelvic drop during L LE stance phase.      Berg Balance Test   Sit to Stand Able to stand without using hands and stabilize independently   Standing Unsupported Able to stand safely 2 minutes   Sitting with Back Unsupported but Feet Supported on Floor or Stool Able to sit safely and securely 2 minutes   Stand to Sit Sits safely with minimal use of hands   Transfers Able to transfer safely, minor use of hands   Standing Unsupported with Eyes Closed Able to stand 10 seconds safely   Standing Ubsupported  with Feet Together Able to place feet together independently and stand 1 minute safely   From Standing, Reach Forward with Outstretched Arm Can reach forward >5 cm safely (2")   From Standing Position, Pick up Object from Floor Able to pick up shoe, needs supervision  slow, some difficulty due to limited back motion   From Standing Position, Turn to Look Behind Over each Shoulder Looks behind from both sides and weight shifts well  spine fusion   Turn 360 Degrees Needs close supervision or verbal cueing  slight unsteadiness but maintained balance   Standing Unsupported, Alternately Place Feet on Step/Stool Needs assistance to keep from falling or unable to try  Tripped on L foot   Standing Unsupported, One Foot in Front Able to plae foot ahead of the other independently and hold 30 seconds   Standing on One Leg Able to lift leg independently and hold equal to or more than 3 seconds  3 seconds on L LE, 8 seconds on R LE   Total Score 43                              PT Education - 03/16/17 2059    Education provided Yes   Education Details need for use of AD, HEP   Person(s) Educated Patient;Spouse   Methods Explanation;Demonstration;Handout   Comprehension Verbalized understanding                    Plan - 03/16/17 1316    Clinical Impression Statement Pt is a 76 year old male who came to PT secondary to screening for whether or not he needs to continue using his SPC. Pt demonstrates decreased foot clearance L LE during swing phase of gait (no LOB), undsteadiness with gait without use of AD during the TUG test (no LOB) and difficulty with L foot clearance with toe taps for the Berg Balance test, resulting in LOB. Pt also demonstrates a score of 43/56 with the Berg Balance test suggesting increased risk for falls and need to use an AD. Pt was recommended to continue using his AD on R side (injury to L hand) for safety and for PT to work on balance if he choses. Pt was givent a HEP to help address deficits.      TUG: without AD: 12 seconds, 11 seconds, 11.19 seconds  11.4 seconds average.    Pt was recommended to use SPC and try PT if he chooses      Patient will benefit from skilled therapeutic intervention in order to improve the following deficits and impairments:     Visit Diagnosis: Unsteadiness on feet     Problem List There are no active problems to display for this patient.  Loralyn FreshwaterMiguel Laygo PT, DPT   03/16/2017, 9:06 PM   [facility] [address] [place], [place], [address] Phone: [phone]   Fax:  [phone]  Name: Ian King MRN: [id number] Date of Birth: 01/09/1941

## 2017-03-16 NOTE — Patient Instructions (Addendum)
"  I love a Engineering geologistarade" Lift     Holding onto something sturdy for support (not shown):   Perform marches 10 times for each side   Perform 3 sets.    Do 2 times daily.       ABDUCTION: Standing (Active)    Holding onto something sturdy for support and safety:  Lift left leg out to side. Use _0__ lbs.   Complete __3_ sets of _10__ repetitions. Perform _2__ sessions per day.  Repeat for your right side.   http://gtsc.exer.us/110   Copyright  VHI. All rights reserved.      Sitting or standing: squeeze your rear end muscles together throughout the day.      To be performed at a pain free range. Pt and wife verbalized understanding.

## 2017-03-16 NOTE — Therapy (Signed)
[place] [facility]ICINE [address] [place], [address] Phone: [phone]   Fax:  [phone]  Occupational Therapy Treatment  Patient Details  Name: Ian King MRN: [id number] Date of Birth: June 23, 1941 Referring Provider: Eliezer Lofts  Encounter Date: 03/16/2017    Past Medical History:  Diagnosis Date  . A-fib (HCC)   . Diabetes mellitus without complication (HCC)   . Hypertension     Past Surgical History:  Procedure Laterality Date  . arm surgery    . BACK SURGERY    . JOINT REPLACEMENT     hip  . JOINT REPLACEMENT Right    knee  . SPINE SURGERY      There were no vitals filed for this visit.      Subjective Assessment - 03/16/17 1842    Subjective  I could not wear that finger splint anymore - it was hurting my finger on top - did not wear it since yesterday - and then I think I am doing about 2 lbs at home  - but I have no pain -  do not know if I am do stretches correct j- only doing heat one time at the house - no in my work shop    Patient Stated Goals I want to get as much use of my L hand and arm as I could - I am L handed - and want to write and do soduko, eat , hygiene, dressing , ect    Currently in Pain? No/denies            [facility] OT Assessment - 03/16/17 0001      AROM   Left Wrist Extension 45 Degrees   Left Wrist Flexion 55 Degrees   Left Wrist Radial Deviation 18 Degrees   Left Wrist Ulnar Deviation 23 Degrees     Strength   Right Hand Grip (lbs) 62   Right Hand Lateral Pinch 23 lbs   Right Hand 3 Point Pinch 13 lbs   Left Hand Grip (lbs) 35   Left Hand Lateral Pinch 16 lbs   Left Hand 3 Point Pinch 8 lbs                  OT Treatments/Exercises (OP) - 03/16/17 0001      LUE Fluidotherapy   Number Minutes Fluidotherapy 10 Minutes   LUE Fluidotherapy Location Hand;Wrist   Comments prior to review anc PROM of wris in all planes - to increase ROM     Pt  arrive with out Mallet finger splint -report dorsal DIP was hurting - stop wearing it since yesterday - extention of DIP was -18   Fabricated volar gutter splint for DIP and PIP - to wear at night time and 2 hrs in middle of day   rest of time off with finger splint   Fluido therapy done  Redone HEP for PROM and stretches with pt and wife for wrist in all planes  With new handout  PROM for wrist flexion , ext, RD and UD - as well as sup and pronatoin 10 reps 2 x day after heat at home   16 oz hammer for sup /pro  16oz hammer for RD ,UD unsupported both  16 oz hammer for wrist extention and flexion done 10 reps over edge of armrest Pt to do 2 x day 10 reps  And can increase to 2 sets 2 x day in 3-4 days if no increase pain  Assess if pt can do 2 lbs - did not had pain - if want can do 2 lbs for sup/pro , RD and UD   but no wrist extention    Reinforce for pt to use heat prior to stretches and PROM                OT Education - 03/16/17 1848    Education provided Yes   Education Details review HEP - reinforce heat prior to PROM    Person(s) Educated Patient;Spouse   Methods Explanation;Demonstration;Tactile cues;Verbal cues;Handout   Comprehension Verbal cues required;Returned demonstration;Verbalized understanding          OT Short Term Goals - 02/13/17 1800      OT SHORT TERM GOAL #1   Title Pt to be ind in HEP to increase AROM in digits and wrist - splint wearing for Mallet/wrist    Baseline no knowledge for HEP for digits and wrist    Time 2   Period Weeks   Status New     OT SHORT TERM GOAL #2   Title AROM in L 3rd digit improve for pt to touch palm without extention lag more than -10 degrees     Baseline -20 extention , Flexion 40 at DIP and PIP 70 -    Time 5   Period Weeks   Status New     OT SHORT TERM GOAL #3   Title Pain on PRWHE improve with 15 points    Baseline at eval pain on PRWHE 26/50   Time 4   Period Weeks   Status New     OT  SHORT TERM GOAL #4   Title L wrist AROM improve for pt turn page, take change in palm, eat 50% with L hand    Baseline See flowsheet -    Time 54   Period Weeks   Status New           OT Long Term Goals - 02/13/17 1808      OT LONG TERM GOAL #1   Title L wrist AROM improve to more than 75% compare to R to use in feeding , pulling up pants, bathing    Baseline see flowsheet    Time 6   Period Weeks   Status New     OT LONG TERM GOAL #2   Title L thumb and digits AROM improve to [facility] for pt to do buttons, write , pull up pants, hold utencils    Baseline see flowsheet    Time 6   Period Weeks   Status New     OT LONG TERM GOAL #3   Title Assess grip and prehension strength when ordered by MD    Baseline NT yet   Time 4   Period Weeks   Status New     OT LONG TERM GOAL #4   Title Function score on PRWHE improve with more than 30 points    Baseline Function score on PRHWE at eval 50/50    Time 8   Period Weeks               Plan - 03/16/17 1850    Clinical Impression Statement Pt cont to make progress in pain , functional use - pt now 12 wks out from wrist fracture and Mallet- pt weaning out of Mallet - and doing about 2 lbs at home with no pain - reinforce with pt  importance to work on PROM of wrist -  pt report increase use of L hand in shaving ,hearing aids , filling coffee mug, eating - started strengthening because of pt being more than 1 0 wks and no pain   Occupational performance deficits (Please refer to evaluation for details): ADL's;IADL's;Leisure;Play   Rehab Potential Good   OT Frequency 1x / week   OT Duration 4 weeks   OT Treatment/Interventions Self-care/ADL training;Fluidtherapy;Splinting;Patient/family education;Therapeutic exercises;Contrast Bath;Parrafin;Manual Therapy;Passive range of motion;Ultrasound   Plan Pt now 12 wks - no pain wth increase use - doing about 1to 2 lbs - assess how doing with HEP    OT Home Exercise Plan see pt instruction    Consulted and Agree with Plan of Care Patient      Patient will benefit from skilled therapeutic intervention in order to improve the following deficits and impairments:  Decreased range of motion, Decreased coordination, Impaired flexibility, Decreased knowledge of precautions, Decreased strength, Impaired UE functional use, Pain, Decreased knowledge of use of DME, Decreased skin integrity  Visit Diagnosis: Stiffness of left wrist, not elsewhere classified - Plan: Ot plan of care cert/re-cert  Stiffness of left hand, not elsewhere classified - Plan: Ot plan of care cert/re-cert  Stiffness of left elbow, not elsewhere classified - Plan: Ot plan of care cert/re-cert  Pain in left arm - Plan: Ot plan of care cert/re-cert  Muscle weakness (generalized) - Plan: Ot plan of care cert/re-cert    Problem List There are no active problems to display for this patient.   Oletta CohnuPreez, Israel Werts OTR/L,CLT 03/16/2017, 6:56 PM  [place] [facility] [address] [place], [place], [address] Phone: [phone]   Fax:  [phone]  Name: Ian GlassLarry L King MRN: [id number] Date of Birth: 04/18/1941

## 2017-03-24 ENCOUNTER — Ambulatory Visit: Payer: Medicare Other | Admitting: Occupational Therapy

## 2017-03-24 DIAGNOSIS — M25632 Stiffness of left wrist, not elsewhere classified: Secondary | ICD-10-CM | POA: Diagnosis not present

## 2017-03-24 DIAGNOSIS — M25622 Stiffness of left elbow, not elsewhere classified: Secondary | ICD-10-CM

## 2017-03-24 DIAGNOSIS — M6281 Muscle weakness (generalized): Secondary | ICD-10-CM

## 2017-03-24 DIAGNOSIS — M25642 Stiffness of left hand, not elsewhere classified: Secondary | ICD-10-CM

## 2017-03-24 NOTE — Therapy (Signed)
[place] [facility] [address] [place], [place], [address] Phone: [phone]   Fax:  [phone]  Occupational Therapy Treatment  Patient Details  Name: Ian King MRN: [id number] Date of Birth: 04/14/1941 Referring Provider: Eliezer LoftsLeversedge  Encounter Date: 03/24/2017      OT End of Session - 03/24/17 1320    Visit Number 10   Number of Visits 16   Date for OT Re-Evaluation 04/10/17   OT Start Time 1105   OT Stop Time 1155   OT Time Calculation (min) 50 min   Activity Tolerance Patient tolerated treatment well   Behavior [facility] for tasks assessed/performed      Past Medical History:  Diagnosis Date  . A-fib (HCC)   . Diabetes mellitus without complication (HCC)   . Hypertension     Past Surgical History:  Procedure Laterality Date  . arm surgery    . BACK SURGERY    . JOINT REPLACEMENT     hip  . JOINT REPLACEMENT Right    knee  . SPINE SURGERY      There were no vitals filed for this visit.      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 No/denies            [facility] - 03/24/17 0001      AROM   Left Forearm Pronation 85 Degrees   Left Forearm Supination 85 Degrees   Left Wrist Extension 50 Degrees   Left Wrist Flexion 60 Degrees     Strength   Left Hand Lateral Pinch 19 lbs   Left Hand 3 Point Pinch 10  lbs        Assess DIP of 3rd - 15 - after putty green - fisting -20  But with PROM extention -15  Pt to only wear splint for 2 wks at night time while working on gripping  Green putty , lat grip  15 reps  2 x day  Can later increase to 2 sets   Assessed wrist AROM Strength in wrist in all planes - 5/5 in the range  Grip and prehension assess - see flowsheet  PRWHE for function done - pt improve from 50/50 to 6.5/5-0  Pt report using wrist and hand in most all act - and have no pain or difficulty - did not do any building or hammering in work shop  - did Statisticianmow the lawn  Pt fitted with neoprene Benik splint to use at wrist as he increase functional use - but pain should not be more than 1-2/10   Pt to follow up in 2 wks again for reassessment and possible discharge  OT Education - 03/24/17 1319    Education provided Yes   Education Details HEP for putty and 3rd digit DIP    Person(s) Educated Patient;Spouse   Methods Explanation;Demonstration;Tactile cues;Verbal cues   Comprehension Verbal cues required;Returned demonstration;Verbalized understanding          OT Short Term Goals - 03/24/17 1320      OT SHORT TERM GOAL #1   Title Pt to be ind in HEP to increase AROM in digits and wrist - splint wearing for Mallet/wrist    Status Achieved     OT SHORT TERM GOAL #2   Title AROM in L 3rd digit improve for pt to touch palm without extention lag more than -10 degrees     Baseline extention lag -15 degrees    Time 4   Period Weeks   Status On-going     OT SHORT TERM GOAL #3   Title Pain on PRWHE improve with 15 points    Baseline at eval pain on PRWHE 26/50- assess next session    Time 2   Period Weeks   Status On-going     OT SHORT TERM GOAL #4   Title L wrist AROM improve for pt turn page, take change in palm, eat 50% with L hand    Status Achieved           OT Long Term Goals - 03/24/17 1321      OT LONG TERM GOAL #1   Title  L wrist AROM improve to more than 75% compare to R to use in feeding , pulling up pants, bathing    Status Achieved     OT LONG TERM GOAL #2   Title L thumb and digits AROM improve to [facility] for pt to do buttons, write , pull up pants, hold utencils    Status Achieved     OT LONG TERM GOAL #3   Title Assess grip and prehension strength when ordered by MD    Baseline see flowsheet    Status Achieved     OT LONG TERM GOAL #4   Title Function score on PRWHE improve with more than 30 points    Baseline Function score on PRHWE at eval 50/50  - improve now to 6.5/50   Status Achieved               Plan - 03/24/17 1327    Clinical Impression Statement Pt making great progress in strength in wrist in all planes - increase functional use for hand and elbow - pt AROM at wrist appear to be about the same - did start putty for gripping but pt to keep eye on mallet finger not to be worse than -10 to -15 - and still wear splint for night time until reassessment in 2 wks  - pt report no pain except after cutting the lawn but pt fitted with Benik wrist splint as he is using hand more in act    Occupational performance deficits (Please refer to evaluation for details): ADL's;IADL's;Play;Leisure   Rehab Potential Good   OT Frequency Biweekly   OT Duration 2 weeks   OT Treatment/Interventions Self-care/ADL training;Fluidtherapy;Splinting;Patient/family education;Therapeutic exercises;Contrast Bath;Parrafin;Manual Therapy;Passive range of motion;Ultrasound   Plan possible discharge in 2 wks    Clinical Decision Making Limited treatment options, no task modification necessary   OT Home Exercise Plan see pt instruction   Consulted and Agree with Plan of Care Patient      Patient will benefit from skilled  therapeutic intervention in order to improve the following deficits and impairments:  Decreased range of motion, Decreased coordination, Impaired flexibility, Decreased knowledge of precautions,  Decreased strength, Impaired UE functional use, Pain, Decreased knowledge of use of DME, Decreased skin integrity  Visit Diagnosis: Stiffness of left hand, not elsewhere classified  Stiffness of left elbow, not elsewhere classified  Muscle weakness (generalized)    Problem List There are no active problems to display for this patient.   Oletta Cohn OTR/L,CLT 03/24/2017, 1:29 PM  [place] [facility] PHYSICAL AND SPORTS MEDICINE [address] [place], [address] Phone: [phone]   Fax:  [phone]  Name: Ian King MRN: [id number] Date of Birth: Oct 16, 1940

## 2017-04-07 ENCOUNTER — Ambulatory Visit: Payer: Medicare Other | Attending: Orthopedic Surgery | Admitting: Occupational Therapy

## 2017-04-07 DIAGNOSIS — M6281 Muscle weakness (generalized): Secondary | ICD-10-CM | POA: Diagnosis present

## 2017-04-07 DIAGNOSIS — M25642 Stiffness of left hand, not elsewhere classified: Secondary | ICD-10-CM | POA: Diagnosis not present

## 2017-04-07 DIAGNOSIS — M25632 Stiffness of left wrist, not elsewhere classified: Secondary | ICD-10-CM

## 2017-04-07 DIAGNOSIS — M25622 Stiffness of left elbow, not elsewhere classified: Secondary | ICD-10-CM | POA: Insufficient documentation

## 2017-04-07 NOTE — Therapy (Signed)
[place] PHYSICAL AND SPORTS MEDICINE [address], [place], [address] Phone: [phone]   Fax:  [phone]  Occupational Therapy Treatment/discharge  Patient Details  Name: Ian King MRN: [id number] Date of Birth: 05-26-1941 Referring Provider: Sherlie Ban  Encounter Date: 04/07/2017      OT End of Session - 04/07/17 1552    Visit Number 11   Number of Visits 11   Date for OT Re-Evaluation 04/07/17   OT Start Time 1050   OT Stop Time 1130   OT Time Calculation (min) 40 min   Activity Tolerance Patient tolerated treatment well   Behavior During Therapy [facility] for tasks assessed/performed      Past Medical History:  Diagnosis Date  . A-fib ([place])   . Diabetes mellitus without complication ([place])   . Hypertension     Past Surgical History:  Procedure Laterality Date  . arm surgery    . BACK SURGERY    . JOINT REPLACEMENT     hip  . JOINT REPLACEMENT Right    knee  . SPINE SURGERY      There were no vitals filed for this visit.      Subjective Assessment - 04/07/17 1549    Subjective  I am doing okay - been using my hand more - did cut the lawn few times - my wife not allowing me in garden - able to get easier to my ear, middle finger about same - I think it will be 15 degrees -  wrist feels okay -excpet when picking up something heavy -  and then my back where I had surgery -  I can feel if picking up something heavy    Patient Stated Goals I want to get as much use of my L hand and arm as I could - I am L handed - and want to write and do soduko, eat , hygiene, dressing , ect    Currently in Pain? No/denies            [facility] OT Assessment - 04/07/17 0001      AROM   Left Forearm Pronation 85 Degrees   Left Forearm Supination 80 Degrees   Left Wrist Extension 53 Degrees   Left Wrist Flexion 64 Degrees   Left Wrist Radial Deviation 18 Degrees   Left Wrist Ulnar Deviation 23 Degrees     Strength   Right Hand  Grip (lbs) 62   Right Hand Lateral Pinch 23 lbs   Right Hand 3 Point Pinch 15 lbs   Left Hand Grip (lbs) 45   Left Hand Lateral Pinch 18 lbs   Left Hand 3 Point Pinch 10 lbs     3rd digit DIP -15     Measurement taken - see flowsheet  Great progress in L grip strength since last time   pt report increase use - cutting lawn , able to reach L ear and shaving better  Using hand in work shop more  Can pick up 10 lbs easy - but gas tanks for lawn mower little tricky - but more upper back  Ed pt on modifying act to decrease pain -and gradually increase his act and weight he pick up As well as doing LE exercises PT gave me - when they screened him for balance to get him off cane  Discuss task modifications  MM for wrist in all planes - 5/5  No pain reported  OT Education - 04/07/17 1551    Education provided Yes   Education Details discharge instructions   Person(s) Educated Patient;Spouse   Methods Explanation;Demonstration;Tactile cues   Comprehension Returned demonstration;Verbalized understanding          OT Short Term Goals - 04/07/17 1555      OT SHORT TERM GOAL #1   Title Pt to be ind in HEP to increase AROM in digits and wrist - splint wearing for Mallet/wrist    Status Achieved     OT SHORT TERM GOAL #2   Title AROM in L 3rd digit improve for pt to touch palm without extention lag more than -10 degrees     Baseline extention lag -15 degrees    Status Partially Met     OT SHORT TERM GOAL #3   Title Pain on PRWHE improve with 15 points    Baseline at eval pain on PRWHE 26/50- pain now less than 5/50    Status Achieved     OT SHORT TERM GOAL #4   Title L wrist AROM improve for pt turn page, take change in palm, eat 50% with L hand    Status Achieved           OT Long Term Goals - 04/07/17 1556      OT LONG TERM GOAL #1   Title L wrist AROM improve to more than 75% compare to R to use in feeding , pulling up pants, bathing     Status Achieved     OT LONG TERM GOAL #2   Title L thumb and digits AROM improve to [facility] for pt to do buttons, write , pull up pants, hold utencils    Status Achieved     OT LONG TERM GOAL #3   Title Assess grip and prehension strength when ordered by MD    Status Achieved     OT LONG TERM GOAL #4   Title Function score on PRWHE improve with more than 30 points    Baseline Function score on PRHWE at eval 50/50  - improve now to 6.5/50   Status Achieved               Plan - 04/07/17 1553    Clinical Impression Statement Pt made great progres from [facility] in ROM , strength in wrist and grip - pt also show increase functional use using hand , wrist and L elbow - pt maller finger stays about at -15 - was still wearing splint at night time - but can stop - pt to ffollow up with Dr Sherlie Ban  next week - pt discharge from OT with using  L dominant UE painfee and contact me if needed    Plan discharge using L UE pain free    OT Home Exercise Plan see pt instruction   Consulted and Agree with Plan of Care Patient;Family member/caregiver      Patient will benefit from skilled therapeutic intervention in order to improve the following deficits and impairments:     Visit Diagnosis: Stiffness of left hand, not elsewhere classified  Stiffness of left elbow, not elsewhere classified  Muscle weakness (generalized)  Stiffness of left wrist, not elsewhere classified    Problem List There are no active problems to display for this patient.   Rosalyn Gess OTR/l,CLT 04/07/2017, 3:58 PM  [place] PHYSICAL AND SPORTS MEDICINE [address] [place], [address] Phone: [phone]   Fax:  [phone]  Name:  Ian King MRN: [id number] Date of Birth: 08-12-1941

## 2017-04-07 NOTE — Patient Instructions (Signed)
Pt to use L hand just normally in act  And if pain - back off and try again in few days

## 2018-02-15 DIAGNOSIS — E669 Obesity, unspecified: Secondary | ICD-10-CM | POA: Insufficient documentation

## 2018-05-12 IMAGING — CT CT CERVICAL SPINE W/O CM
3 of 13 series · 7 of 33 positions shown, 8 images · non-contrast
Comparison: 09/17/2014

CLINICAL DATA: Fall.  Abrasions to forehead.  Bleeding from nose.

EXAM:
CT HEAD WITHOUT CONTRAST
CT MAXILLOFACIAL WITHOUT CONTRAST
CT CERVICAL SPINE WITHOUT CONTRAST
TECHNIQUE: Multidetector CT imaging of the head, cervical spine, and
maxillofacial structures were performed using the standard protocol
without intravenous contrast. Multiplanar CT image reconstructions
of the cervical spine and maxillofacial structures were also
generated.

[Series 9: sagittal bone · sagittal · 0.28mm/px · 1 of 70 slices shown]
[im 35/70  bone]
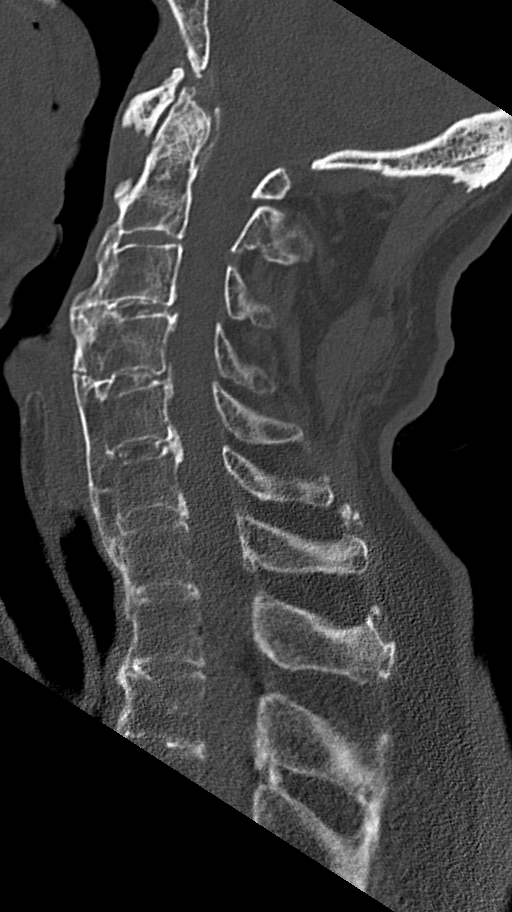

[Series 11: orthogonal axials · axial · 0.29mm/px · z∈[-373,-154]mm · 3 of 126 slices shown, 4 images]
[im 1/126  soft-tissue]
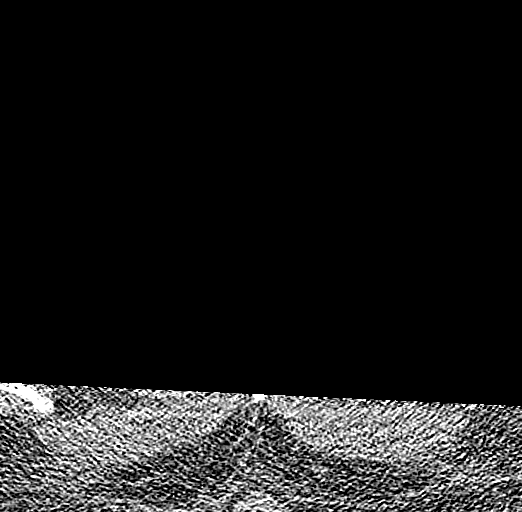
[im 1/126  bone]
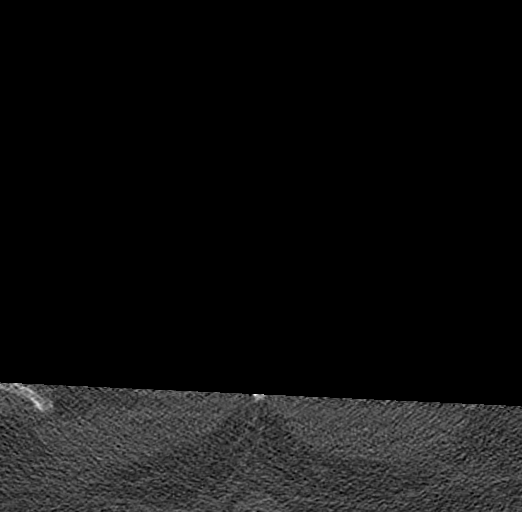
[im 63/126  bone]
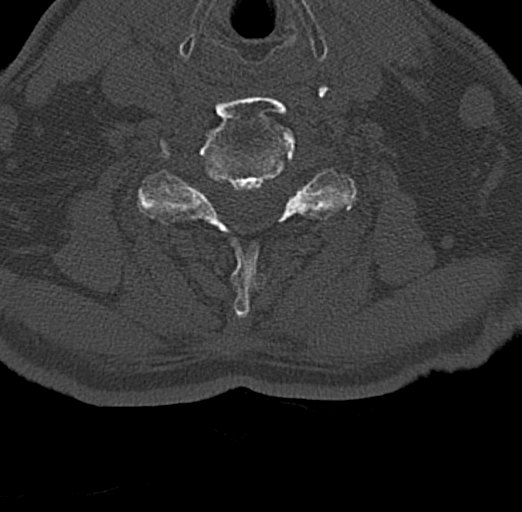
[im 126/126  bone]
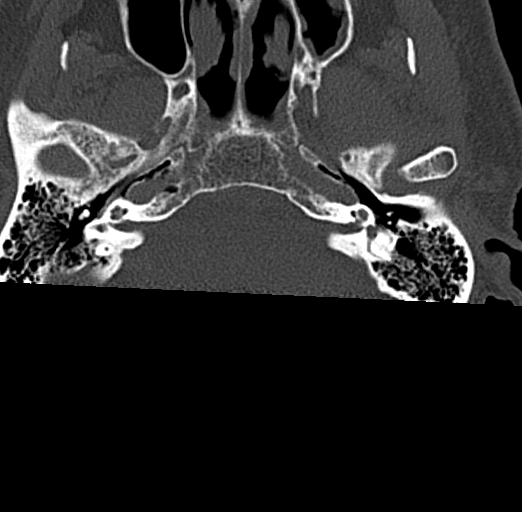

[Series 21: t spine soft · axial · 0.41mm/px · z∈[-506,-322]mm · 3 of 184 slices shown]
[im 46/184  soft-tissue]
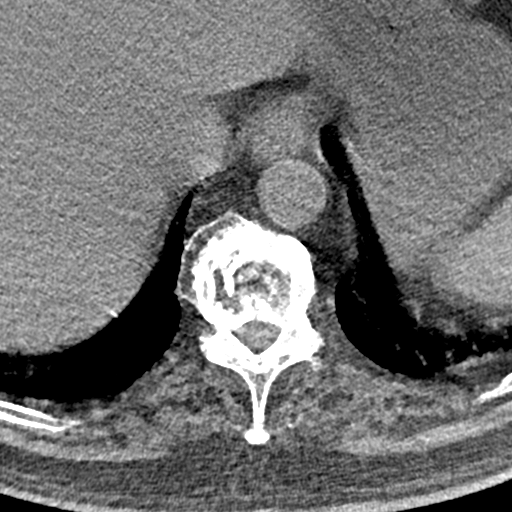
[im 92/184  soft-tissue]
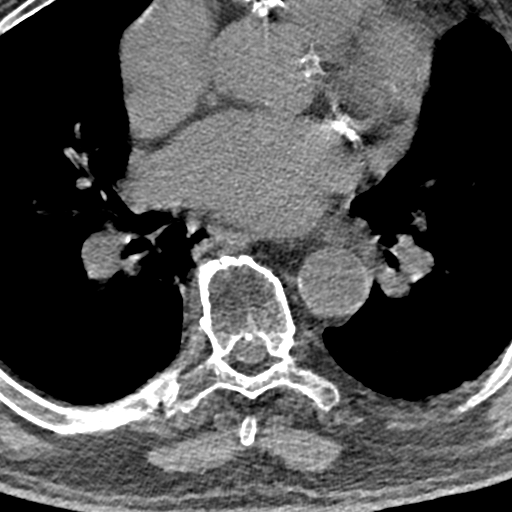
[im 138/184  soft-tissue]
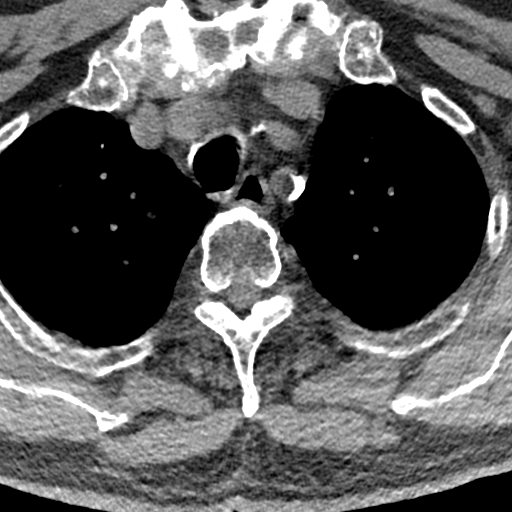

[7 of 33 positions shown; findings below may reference images not displayed]

FINDINGS: CT HEAD FINDINGS

Brain: Diffuse cerebral atrophy. No acute intracranial abnormality.
Specifically, no hemorrhage, hydrocephalus, mass lesion, acute
infarction, or significant intracranial injury.

Vascular: No hyperdense vessel or unexpected calcification.

Skull: No acute calvarial abnormality.

Other: None

CT MAXILLOFACIAL FINDINGS

Osseous: Nasal bone fractures noted near the tip of the nasal bone.
No additional facial fracture.

Orbits: Orbital soft tissues unremarkable.  Orbital walls intact.

Sinuses: Mucosal thickening in the left maxillary sinus and
scattered ethmoid air cells.

Soft tissues: Soft tissue swelling over the nose.

CT CERVICAL SPINE FINDINGS

Alignment: Normal

Skull base and vertebrae: Flowing osteophytes noted anteriorly
throughout the cervical spine. There is linear lucency noted through
the anterior osteophyte anterior to the low worse C4 vertebral body
near the C4-5 disc space. This may represent a nondisplaced fracture
through the anterior osteophyte. No visible involvement of the
vertebral body.

Soft tissues and spinal canal: Prevertebral soft tissues are normal.
No epidural or paraspinal hematoma.

Disc levels:  Narrowed diffusely.

Upper chest: Negative

Other: None
IMPRESSION: No acute intracranial abnormality.

Nasal bone fractures near the tip of the nasal bone with overlying
soft tissue swelling.

Mild chronic sinusitis.

Large flowing anterior osteophytes throughout the cervical spine.
There is linear lucency through the anterior osteophyte at the C4-5
level, possibly a nondisplaced fracture through the osteophyte. No
visible involvement of the vertebral body.

## 2018-07-02 DIAGNOSIS — Z961 Presence of intraocular lens: Secondary | ICD-10-CM | POA: Insufficient documentation

## 2020-03-09 DIAGNOSIS — I63529 Cerebral infarction due to unspecified occlusion or stenosis of unspecified anterior cerebral artery: Secondary | ICD-10-CM | POA: Insufficient documentation

## 2020-03-10 DIAGNOSIS — E538 Deficiency of other specified B group vitamins: Secondary | ICD-10-CM | POA: Insufficient documentation

## 2020-03-19 ENCOUNTER — Encounter (INDEPENDENT_AMBULATORY_CARE_PROVIDER_SITE_OTHER): Payer: Self-pay | Admitting: Vascular Surgery

## 2020-03-19 ENCOUNTER — Ambulatory Visit (INDEPENDENT_AMBULATORY_CARE_PROVIDER_SITE_OTHER): Payer: Medicare PPO

## 2020-03-19 ENCOUNTER — Other Ambulatory Visit (INDEPENDENT_AMBULATORY_CARE_PROVIDER_SITE_OTHER): Payer: Self-pay | Admitting: Vascular Surgery

## 2020-03-19 ENCOUNTER — Ambulatory Visit (INDEPENDENT_AMBULATORY_CARE_PROVIDER_SITE_OTHER): Payer: Medicare PPO | Admitting: Vascular Surgery

## 2020-03-19 ENCOUNTER — Other Ambulatory Visit: Payer: Self-pay

## 2020-03-19 VITALS — BP 149/87 | HR 86 | Resp 16 | Ht 72.0 in | Wt 223.0 lb

## 2020-03-19 DIAGNOSIS — R0989 Other specified symptoms and signs involving the circulatory and respiratory systems: Secondary | ICD-10-CM | POA: Diagnosis not present

## 2020-03-19 DIAGNOSIS — I872 Venous insufficiency (chronic) (peripheral): Secondary | ICD-10-CM

## 2020-03-19 DIAGNOSIS — I739 Peripheral vascular disease, unspecified: Secondary | ICD-10-CM

## 2020-03-19 DIAGNOSIS — I7300 Raynaud's syndrome without gangrene: Secondary | ICD-10-CM | POA: Diagnosis not present

## 2020-03-19 DIAGNOSIS — I25118 Atherosclerotic heart disease of native coronary artery with other forms of angina pectoris: Secondary | ICD-10-CM | POA: Diagnosis not present

## 2020-03-19 DIAGNOSIS — I10 Essential (primary) hypertension: Secondary | ICD-10-CM | POA: Diagnosis not present

## 2020-03-19 DIAGNOSIS — I482 Chronic atrial fibrillation, unspecified: Secondary | ICD-10-CM

## 2020-03-22 ENCOUNTER — Encounter (INDEPENDENT_AMBULATORY_CARE_PROVIDER_SITE_OTHER): Payer: Self-pay | Admitting: Vascular Surgery

## 2020-03-22 DIAGNOSIS — I7300 Raynaud's syndrome without gangrene: Secondary | ICD-10-CM | POA: Insufficient documentation

## 2020-03-22 DIAGNOSIS — I872 Venous insufficiency (chronic) (peripheral): Secondary | ICD-10-CM | POA: Insufficient documentation

## 2020-03-22 NOTE — Progress Notes (Addendum)
MRN : [id number]  Ian King is a 78 y.o. ([birth year]) male who presents with chief complaint of  Chief Complaint  Patient presents with   New Patient (Initial Visit)    ref [facility] consult poor circulation  .  History of Present Illness:   The patient is seen for the evaluation of painful fingers (mostly) and toes associated with Raynaud's changes. This seems to correlate with a recent CVA.  The patient notes the fingers and toes turned pale and then blue and become very painful. Exposure to cold environments makes the symptoms much worse. The changes have been going on for years and seemed to be much worse lately. There is no history of trauma or repetitive injury. The patient does note some peeling of the skin of the fingers in the palms and the skin of the toes on the soles.  The patient has been taking Norvasc with little benefit. The patient has not been taking Norvasc  There is no history of malignancy or autoimmune disease.  The patient denies amaurosis fugax or recent TIA symptoms. There are no recent neurological changes noted. The patient denies claudication symptoms or rest pain symptoms. The patient denies history of DVT, PE or superficial thrombophlebitis. The patient denies recent episodes of angina or shortness of breath.    Current Meds  Medication Sig   atorvastatin (LIPITOR) 40 MG tablet Take 40 mg by mouth daily.   cyanocobalamin 1000 MCG tablet Take by mouth.   furosemide (LASIX) 20 MG tablet Take 20 mg by mouth 4 (four) times a week.    levETIRAcetam (KEPPRA) 1000 MG tablet    metFORMIN (GLUCOPHAGE-XR) 500 MG 24 hr tablet Take 1,000 mg by mouth in the morning and at bedtime.    metoprolol tartrate (LOPRESSOR) 25 MG tablet Take 25 mg by mouth 2 (two) times daily.   Omega-3 Fatty Acids (FISH OIL) 1000 MG CAPS Take by mouth.   polyethylene glycol powder (GLYCOLAX/MIRALAX) 17 GM/SCOOP powder Take by mouth.   ramipril (ALTACE) 10 MG capsule Take 10  mg by mouth daily.   rivaroxaban (XARELTO) 20 MG TABS tablet Take by mouth.    Past Medical History:  Diagnosis Date   A-fib (HCC)    Diabetes mellitus without complication (HCC)    Hypertension     Past Surgical History:  Procedure Laterality Date   arm surgery     BACK SURGERY     JOINT REPLACEMENT     hip   JOINT REPLACEMENT Right    knee   SPINE SURGERY      Social History Social History   Tobacco Use   Smoking status: Former Smoker    Types: Cigars   Smokeless tobacco: Never Used  Substance Use Topics   Alcohol use: Never   Drug use: Never    Family History Family History  Problem Relation Age of Onset   Heart disease Mother    Heart disease Father    Heart disease Sister    Leukemia Brother    Heart disease Brother    Diabetes Brother   No family history of bleeding/clotting disorders, porphyria or autoimmune disease   Allergies  Allergen Reactions   Lovastatin Swelling     REVIEW OF SYSTEMS (Negative unless checked)  Constitutional: [] Weight loss  [] Fever  [] Chills Cardiac: [] Chest pain   [] Chest pressure   [] Palpitations   [] Shortness of breath when laying flat   [] Shortness of breath with exertion. Vascular:  [] Pain in legs with walking   [  x]Pain in legs at rest  [] History of DVT   [] Phlebitis   [x] Swelling in legs   [] Varicose veins   [] Non-healing ulcers Pulmonary:   [] Uses home oxygen   [] Productive cough   [] Hemoptysis   [] Wheeze  [] COPD   [] Asthma Neurologic:  [] Dizziness   [] Seizures   [] History of stroke   [] History of TIA  [] Aphasia   [] Vissual changes   [] Weakness or numbness in arm   [] Weakness or numbness in leg Musculoskeletal:   [] Joint swelling   [] Joint pain   [] Low back pain Hematologic:  [] Easy bruising  [] Easy bleeding   [] Hypercoagulable state   [] Anemic Gastrointestinal:  [] Diarrhea   [] Vomiting  [] Gastroesophageal reflux/heartburn   [] Difficulty swallowing. Genitourinary:  [] Chronic kidney disease    [] Difficult urination  [] Frequent urination   [] Blood in urine Skin:  [] Rashes   [] Ulcers  Psychological:  [] History of anxiety   []  History of major depression.  Physical Examination  Vitals:   03/19/20 1324  BP: (!) 149/87  Pulse: 86  Resp: 16  Weight: 223 lb (101.2 kg)  Height: 6' (1.829 m)   Body mass index is 30.24 kg/m. Gen: WD/WN, NAD Head: [place]/AT, No temporalis wasting.  Ear/Nose/Throat: Hearing grossly intact, nares w/o erythema or drainage, poor dentition Eyes: PER, EOMI, sclera nonicteric.  Neck: Supple, no masses.  No bruit or JVD.  Pulmonary:  Good air movement, clear to auscultation bilaterally, no use of accessory muscles.  Cardiac: RRR, normal S1, S2, no Murmurs. Vascular: scattered varicosities present bilaterally.  Mild venous stasis changes to the legs bilaterally.  2+ soft pitting edema there are mild Raynaud's changes particularly of the hands Vessel Right Left  Radial Palpable Palpable  Brachial Palpable Palpable  PT Palpable Palpable  DP Palpable Palpable  Gastrointestinal: soft, non-distended. No guarding/no peritoneal signs.  Musculoskeletal: M/S 5/5 throughout.  No deformity or atrophy.  Neurologic: CN 2-12 intact. Pain and light touch intact in extremities.  Symmetrical.  Speech is fluent. Motor exam as listed above. Psychiatric: Judgment intact, Mood & affect appropriate for pt's clinical situation. Dermatologic: No rashes or ulcers noted.  No changes consistent with cellulitis. Lymph : No lichenification or skin changes of chronic lymphedema.  CBC No results found for: WBC, HGB, HCT, MCV, PLT  BMET No results found for: NA, K, CL, CO2, GLUCOSE, BUN, CREATININE, CALCIUM, GFRNONAA, GFRAA CrCl cannot be calculated (No successful lab value found.).  COAG No results found for: INR, PROTIME  Radiology VAS ABI WITH/WO TBI  Result Date: 03/19/2020 LOWER EXTREMITY DOPPLER STUDY Indications: Poor circulation & Raynauds.  Performing Technologist:  RT, RDMS, RVT  Examination Guidelines: A complete evaluation includes at minimum, Doppler waveform signals and systolic blood pressure reading at the level of bilateral brachial, anterior tibial, and posterior tibial arteries, when vessel segments are accessible. Bilateral testing is considered an integral part of a complete examination. Photoelectric Plethysmograph (PPG) waveforms and toe systolic pressure readings are included as required and additional duplex testing as needed. Limited examinations for reoccurring indications may be performed as noted.  ABI Findings: +---------+------------------+-----+---------+----------------+  Right     Rt Pressure (mmHg) Index Waveform  Comment           +---------+------------------+-----+---------+----------------+  Brachial  188                                                  +---------+------------------+-----+---------+----------------+  ATA                                triphasic non-compressible  +---------+------------------+-----+---------+----------------+  PTA                                triphasic non-compressible  +---------+------------------+-----+---------+----------------+  Great Toe 189                0.99  Normal                      +---------+------------------+-----+---------+----------------+ +---------+------------------+-----+---------+----------------+  Left      Lt Pressure (mmHg) Index Waveform  Comment           +---------+------------------+-----+---------+----------------+  Brachial  191                                                  +---------+------------------+-----+---------+----------------+  ATA                                biphasic  non-compressible  +---------+------------------+-----+---------+----------------+  PTA                                triphasic non-compressible  +---------+------------------+-----+---------+----------------+  Great Toe 160                0.84  Normal                       +---------+------------------+-----+---------+----------------+ Upper Extremity Digital Findings: +-------------+---------------+--------+-------+  Right Fingers Pressure (mmHg) Waveform Comment  +-------------+---------------+--------+-------+  1st Digit                     Normal            +-------------+---------------+--------+-------+  2nd Digit                     Normal            +-------------+---------------+--------+-------+  3rd Digit                     Normal            +-------------+---------------+--------+-------+  4th Digit                     Normal            +-------------+---------------+--------+-------+  5th Digit                     Normal            +-------------+---------------+--------+-------+  +-----------+---------------+--------+-------+  Left Finger Pressure (mmHg) Waveform Comment  +-----------+---------------+--------+-------+  1st Digit                   Normal            +-----------+---------------+--------+-------+  2nd Digit                   Normal            +-----------+---------------+--------+-------+  3rd Digit  Normal            +-----------+---------------+--------+-------+  4th Digit                   Normal            +-----------+---------------+--------+-------+  5th Digit                   Normal            +-----------+---------------+--------+-------+  Summary: Bilateral: Resting bilateral ankle-brachial indexes indicate noncompressible lower extremity arteries. Bilateral tibial artery Doppler waveforms and TBIs suggest normal arterial perfusion to the bilateral lower extremities. Normal PPG waveforms throughout the bilateral upper extremity digits.  *See table(s) above for measurements and observations.  Electronically signed by Hortencia Pilar MD on 03/19/2020 at 5:17:24 PM.    Final      Assessment/Plan 1. Raynaud's phenomenon without gangrene The patient's Raynaud's is better and her symptoms are stable.  Behavioral modification  was stressed; avoidance of cold and utilizing wool socks was reviewed again.  The reported BP today was above the AHA goal and therefore there will continue Norvasc.  Norvasc 5 mg po daily is prescribed  The patient will follow up in PRN, sooner if there are problems.   - VAS [place] ABI WITH/WO TBI; Future - VAS [place] ABI WITH/WO TBI; Future  2. Chronic venous insufficiency No surgery or intervention at this point in time.    I have had a long discussion with the patient regarding venous insufficiency and why it  causes symptoms. I have discussed with the patient the chronic skin changes that accompany venous insufficiency and the long term sequela such as infection and ulceration.  Patient will begin wearing graduated compression stockings class 1 (20-30 mmHg) or compression wraps on a daily basis a prescription was given. The patient will put the stockings on first thing in the morning and removing them in the evening. The patient is instructed specifically not to sleep in the stockings.    In addition, behavioral modification including several periods of elevation of the lower extremities during the day will be continued. I have demonstrated that proper elevation is a position with the ankles at heart level.  The patient is instructed to begin routine exercise, especially walking on a daily basis  Following the review of the ultrasound the patient will follow up in 12 months to reassess the degree of swelling and the control that graduated compression stockings or compression wraps  is offering.   The patient can be assessed for a Lymph Pump at that time  3. Coronary artery disease of native artery of native heart with stable angina pectoris (HCC) Continue cardiac and antihypertensive medications as already ordered and reviewed, no changes at this time.  Continue statin as ordered and reviewed, no changes at this time  Nitrates PRN for chest pain   4. Essential hypertension Continue  antihypertensive medications as already ordered, these medications have been reviewed and there are no changes at this time.   5. Chronic atrial fibrillation (HCC) Continue antiarrhythmia medications as already ordered, these medications have been reviewed and there are no changes at this time.  Continue anticoagulation as ordered by Cardiology Service   Hortencia Pilar, MD  03/22/2020 11:01 AM

## 2020-08-05 ENCOUNTER — Ambulatory Visit: Payer: Medicare PPO | Attending: Neurology | Admitting: Occupational Therapy

## 2020-08-05 ENCOUNTER — Other Ambulatory Visit: Payer: Self-pay

## 2020-08-05 ENCOUNTER — Encounter: Payer: Self-pay | Admitting: Occupational Therapy

## 2020-08-05 ENCOUNTER — Ambulatory Visit: Payer: Medicare PPO | Admitting: Speech Pathology

## 2020-08-05 DIAGNOSIS — R41841 Cognitive communication deficit: Secondary | ICD-10-CM | POA: Insufficient documentation

## 2020-08-05 DIAGNOSIS — R293 Abnormal posture: Secondary | ICD-10-CM

## 2020-08-05 DIAGNOSIS — R2681 Unsteadiness on feet: Secondary | ICD-10-CM | POA: Diagnosis present

## 2020-08-05 DIAGNOSIS — R262 Difficulty in walking, not elsewhere classified: Secondary | ICD-10-CM | POA: Diagnosis not present

## 2020-08-05 DIAGNOSIS — M6281 Muscle weakness (generalized): Secondary | ICD-10-CM | POA: Diagnosis present

## 2020-08-05 DIAGNOSIS — R278 Other lack of coordination: Secondary | ICD-10-CM | POA: Diagnosis present

## 2020-08-06 ENCOUNTER — Encounter: Payer: Self-pay | Admitting: Speech Pathology

## 2020-08-06 ENCOUNTER — Other Ambulatory Visit: Payer: Self-pay

## 2020-08-06 NOTE — Therapy (Signed)
[facility] MAIN [facility] SERVICES [address] [place], [place], [address] Phone: [phone]   Fax:  [phone]  Speech Language Pathology Evaluation  Patient Details  Name: Ian King MRN: [id number] Date of Birth: Feb 20, 1941 Referring Provider (SLP): Dr. Sherryll Burger   Encounter Date: 08/05/2020   End of Session - 08/06/20 1304    Visit Number 1    Number of Visits 17    Date for SLP Re-Evaluation 10/02/20    Authorization Type Medicare    Authorization Time Period Start 08/05/2020    Authorization - Visit Number 1    Progress Note Due on Visit 10    SLP Start Time 1400    SLP Stop Time  1500    SLP Time Calculation (min) 60 min    Activity Tolerance Patient tolerated treatment well           Past Medical History:  Diagnosis Date  . A-fib (HCC)   . Diabetes mellitus without complication (HCC)   . Hypertension     Past Surgical History:  Procedure Laterality Date  . arm surgery    . BACK SURGERY    . JOINT REPLACEMENT     hip  . JOINT REPLACEMENT Right    knee  . SPINE SURGERY      There were no vitals filed for this visit.       SLP Evaluation [facility] - 08/06/20 0001      SLP Visit Information   SLP Received On 08/05/20    Referring Provider (SLP) Dr. Sherryll Burger    Onset Date 03/08/2020    Medical Diagnosis CVA      Subjective   Subjective Patient referred for LSVT-LOUD but presents with functional voice/speech and impaired cognitive communication skills.    Patient/Family Stated Goal Maximize independence for engaging in social and cognitive activities      Pain Assessment   Currently in Pain? No/denies      General Information   HPI [facility] 03/08/2020-03/10/2020:  Mr. Dworkin is a 79 y.o. male with PMHx of Afib on Xarelto, HLD, former tobacco use, T2DM on metformin, CAD who presents with days of gait changes and disorientation with evidence of stroke on CT. Neurology is consulted in this setting.  Family at bedside states on  Wednesday around 0800 the patient developed some episodes of disorientation and difficulty with his words. Often noted to have the gift of gab, but the patient had less output, needed more reminders to do things, and was having more trouble. Wife endorses an instance where she needed to remind him to get his cane multiple times. On Friday at a wedding, the patient had more trouble getting off a bench and getting to the car. He seemed more confused. Patient denied any focal weakness, sensory changes, vision changes or word finding trouble. People at the wedding also noticed.    MOCA 03/09/2020: 27/30.   MRI brain 03/09/2020- 1. Findings consistent with acute to subacute right ventricle stripe territory infarction.  2. There are small bilateral acute foci of restricted diffusion at the convexities consistent with acute cortical infarctions. The possibility of embolic disease is raised. 3. More chronic ischemic changes bilaterally in the posterior cerebral artery territories and less prominently in the middle cerebral artery territories.   Neurology office visit 05/28/2020: Concerned for underlying parkinsonism with symptoms of hypomimia, bradyphrenia, bradykinesia, hunched forward posture, festinating gait.  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) in a patient with a history of multiple  embolic infarcts + white matter microvascular ischemic and metabolic changes + cerebral atrophy + severe obstructive sleep apnea         Prior Functional Status   Cognitive/Linguistic Baseline Baseline deficits    Baseline deficit details mixed dementia/parkinsonism      Cognition   Overall Cognitive Status Impaired/Different from baseline    Area of Impairment Attention;Memory;Following commands;Problem solving;Awareness;Safety/judgement      Auditory Comprehension   Overall Auditory Comprehension Impaired      Reading Comprehension   Reading Status Within funtional limits   For  simple/basic reading     Expression   Primary Mode of Expression Verbal      Verbal Expression   Overall Verbal Expression Impaired      Written Expression   Dominant Hand Left    Written Expression Within Functional Limits    Overall Writen Expression fxl for simple contexts      Oral Motor/Sensory Function   Overall Oral Motor/Sensory Function Appears within functional limits for tasks assessed    Labial ROM Reduced left      Motor Speech   Overall Motor Speech Appears within functional limits for tasks assessed      Standardized Assessments   Standardized Assessments  Western Aphasia Battery revised            Western Aphasia Battery- Screening   Spontaneous Speech      Information content   6/10       Fluency    8/10      Comprehension     Yes/No questions   10/10          Sequential Commands  2/10      Repetition    10/10      Naming    Object Naming   7/10        Screening Aphasia Quotient 72/100   Reading    7/10 (When allowed to have text to answer questions)  Writing    8/10  Screening Language Quotient 72/100   SLP Education - 08/06/20 1303    Education Details Results and recommendations    Person(s) Educated Patient;Spouse    Methods Explanation    Comprehension Verbalized understanding              SLP Long Term Goals - 08/06/20 1307      SLP LONG TERM GOAL #1   Title Patient and his wife will participate in developing functional compensatory strategies to improve engagement and activity.    Time 8    Period Weeks    Status New    Target Date 10/02/20      SLP LONG TERM GOAL #2   Title Patient will demonstrate functional cognitive-communication skills for independent completion of personal responsibilities and leisure activities.    Time 8    Period Weeks    Status New    Target Date 10/02/20      SLP LONG TERM GOAL #3   Title Patient will generate grammatical and cogent sentence to complete simple/concrete linguistic task  with 80% accuracy..    Time 8    Period Weeks    Status New    Target Date 10/02/20      SLP LONG TERM GOAL #4   Title Patient will complete semantic feature word finding tasks with 80% accuracy.    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  The patient was referred for LSVT-LOUD but presents with functional voice/speech and impaired cognitive communication skills. Per family report, prior to the stroke in June the patient had much more robust language skills and was able to participate in social communication and stimulating leisure activities (eg. sudoku and complex jigsaw puzzles).  The patient would benefit from skilled speech therapy for restorative and compensatory treatment of cognitive communication deficits to improve independent participation in social and cognitively stimulating activities.    Speech Therapy Frequency 2x / week    Duration 8 weeks    Treatment/Interventions Cognitive reorganization;Compensatory techniques;SLP instruction and feedback;Patient/family education    Potential to Achieve Goals Good    Potential Considerations Ability to learn/carryover information;Previous level of function;Co-morbidities;Severity of impairments;Cooperation/participation level;Medical prognosis;Family/community support    Consulted and Agree with Plan of Care Patient;Family member/caregiver    Family Member Consulted Spouse           Patient will benefit from skilled  therapeutic intervention in order to improve the following deficits and impairments:   Cognitive communication deficit - Plan: SLP plan of care cert/re-cert    Problem List Patient Active Problem List   Diagnosis Date Noted  . Raynaud phenomenon 03/22/2020  . Chronic venous insufficiency 03/22/2020  . B12 deficiency 03/10/2020  . 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 Pseudophakia of left eye 07/02/2018  . Obesity, Class I, BMI 30-34.9 02/15/2018  . Closed displaced fracture of distal phalanx of left middle finger 03/08/2017  . Closed fracture of distal end of left radius with routine healing 01/16/2017  . Closed fracture of nasal bone with routine healing 01/12/2017  . 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  . Closed fracture of dorsal (thoracic) vertebra (HCC) 12/19/2016  . Fall 12/19/2016  . Fracture of left radius 12/19/2016  . Primary osteoarthritis of right knee 10/14/2015  . Acute pain of right knee 09/09/2015  . Aftercare following joint replacement surgery 07/31/2015  . Closed nondisplaced fracture of lateral condyle of femur (HCC) 07/31/2015  . Bilateral edema of lower extremity 06/26/2014  . Chronic atrial fibrillation (HCC) 07/13/2013  . Wound disruption, post-op, skin 10/11/2012  . Cellulitis and abscess 10/05/2012  . Hyperlipidemia 08/17/2012  . Osteoarthritis 08/17/2012  . Seizure disorder (HCC) 08/16/2012  . Hip pain 12/12/2011  . 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 CAD (coronary artery disease) 07/07/2011  . Diabetes mellitus, type 2 (HCC) 07/07/2011  . HTN (hypertension) 07/07/2011   Dollene Primrose, MS/CCC- SLP  Leandrew Koyanagi 08/06/2020, 1:16 PM   [facility] MAIN [facility] SERVICES [address] [place], [place], [address] Phone: [phone]   Fax:  [phone]  Name: Ian King MRN: [id number] Date of Birth: Mar 23, 1941

## 2020-08-07 NOTE — Therapy (Addendum)
[place] [facility] MAIN [facility] SERVICES [address] [place], [place], [address] Phone: [phone]   Fax:  [phone]  Occupational Therapy Evaluation  Patient Details  Name: Ian King MRN: [id number] Date of Birth: 12/05/1940 No data recorded  Encounter Date: 08/05/2020   OT End of Session - 08/07/20 0750    Visit Number 1    Number of Visits 17    Date for OT Re-Evaluation 09/11/20    OT Start Time 1300    OT Stop Time 1400    OT Time Calculation (min) 60 min    Activity Tolerance Patient tolerated treatment well    Behavior During Therapy [facility] for tasks assessed/performed           Past Medical History:  Diagnosis Date  . A-fib (HCC)   . Diabetes mellitus without complication (HCC)   . Hypertension     Past Surgical History:  Procedure Laterality Date  . arm surgery    . BACK SURGERY    . JOINT REPLACEMENT     hip  . JOINT REPLACEMENT Right    knee  . SPINE SURGERY      There were no vitals filed for this visit.   Subjective Assessment - 08/07/20 0739    Subjective  Patient arrived with his wife, Natalia Leatherwood.  Pt reports 2-3 falls in the last 6 months.   Wife reports patient has memory problems, decreased mobility and decreased ability to perform daily tasks in recent months.    Patient Stated Goals Patient reports he would like to be as independent as he can for himself.  "I want to do as much as I can."    Currently in Pain? No/denies    Pain Score 0-No pain             [facility] OT Assessment - 08/07/20 0742      Balance Screen   Has the patient fallen in the past 6 months Yes    How many times? 3    Has the patient had a decrease in activity level because of a fear of falling?  Yes    Is the patient reluctant to leave their home because of a fear of falling?  No      Home  Environment   Lives With Spouse      Prior Function   Vocation Retired      ADL   Eating/Feeding Set up    Grooming Minimal assistance     Upper Body Bathing Set up    Lower Body Bathing Moderate assistance    Upper Body Dressing Minimal assistance    Lower Body Dressing Maximal assistance    Toilet Transfer Modified independent    Toileting - Clothing Manipulation Increased time    Tub/Shower Transfer Supervision/safety    ADL comments Patient has a shower seat and hand held shower, handicapped toilet with bidea, grab bars by the shower and commode.  Patient has not been able to return to any cooking or cleaning or helping wife around the house.  He is able to obtain a snack and drink from the kitchen.  He has not driven in the last 3 years.  He ambulates with a cane.        IADL   Shopping Needs to be accompanied on any shopping trip    Prior Level of Function Light Housekeeping independent    Light Housekeeping Does not participate in any housekeeping tasks    Prior Level  of Function Meal Prep independent    Meal Prep Needs to have meals prepared and served    Prior Level of Function Scientist, research (physical sciences) Relies on family or friends for transportation    Prior Level of Function Medication Managment independent     Medication Management Is not capable of dispensing or managing own medication;Has difficulty remembering to take medication      Mobility   Mobility Status History of falls;Needs assist      Written Expression   Dominant Hand Left    Handwriting 50% legible;Mild micrographia      Vision - History   Baseline Vision No visual deficits      Cognition   Area of Impairment Attention;Memory;Problem solving    Cognition Comments Wife reports decline in memory over the last few years but worse in recent months.       Sensation   Light Touch Appears Intact    Stereognosis Appears Intact    Hot/Cold Appears Intact    Proprioception Appears Intact      Coordination   Gross Motor Movements are Fluid and Coordinated No    Fine Motor Movements are Fluid and Coordinated No     Finger Nose Finger Test impaired    9 Hole Peg Test Right;Left    Right 9 Hole Peg Test 59    Left 9 Hole Peg Test 1 min 10 sec    Coordination Patient requires repeat of directions often, slow to perform tasks.       ROM / Strength   AROM / PROM / Strength AROM;Strength      AROM   Overall AROM Comments BUE WFLs for ROM, shoulder flexion bilaterally to 135 degrees      Strength   Overall Strength Within functional limits for tasks performed    Overall Strength Comments overall strength 4/5 in bilateral shoulders       Hand Function   Right Hand Grip (lbs) 39    Right Hand Lateral Pinch 20 lbs    Right Hand 3 Point Pinch 13 lbs    Left Hand Grip (lbs) 48    Left Hand Lateral Pinch 19 lbs    Left 3 point pinch 11 lbs           5 times sit to stand 20 secs 6 min walk test 740 feet                OT Education - 08/07/20 0750    Education provided Yes    Education Details role of OT, plan of care, LSVT BIG    Person(s) Educated Patient;Spouse    Methods Explanation    Comprehension Verbalized understanding            OT Short Term Goals - 04/07/17 1555      OT SHORT TERM GOAL #1   Title Pt to be ind in HEP to increase AROM in digits and wrist - splint wearing for Mallet/wrist     Status Achieved      OT SHORT TERM GOAL #2   Title AROM in L 3rd digit improve for pt to touch palm without extention lag more than -10 degrees      Baseline extention lag -15 degrees     Status Partially Met      OT SHORT TERM GOAL #3   Title Pain on PRWHE improve with 15 points     Baseline at eval pain on PRWHE 26/50- pain  now less than 5/50     Status Achieved      OT SHORT TERM GOAL #4   Title L wrist AROM improve for pt turn page, take change in palm, eat 50% with L hand     Status Achieved             OT Long Term Goals - 08/07/20 0754      OT LONG TERM GOAL #1   Title Patient will improve gait speed and endurance to be able to ambulate 900 feet in 6  minutes to negotiate around the home and community safely in 4 weeks.    Baseline 740 feet at eval    Time 4    Period Weeks    Status New    Target Date 09/11/20      OT LONG TERM GOAL #2   Title Patient will complete HEP for maximal daily exercises with min assist in 4 weeks.    Baseline no current program    Time 4    Period Weeks    Status New    Target Date 09/11/20      OT LONG TERM GOAL #3   Title Patient will transfer from to sit to stand without the use of arms safely and independently from a variety of chairs/surfaces in 4 weeks.    Baseline difficulty with low surfaces    Time 4    Period Weeks    Status New    Target Date 09/11/20      OT LONG TERM GOAL #4   Title Will assess BERG balance next session    Baseline unable to perform at eval due to time constraints, patient has functional balance deficits during eval    Time 1    Period Weeks    Status New    Target Date 08/14/20      OT LONG TERM GOAL #5   Title Patient will complete light meal prep with supervision.    Baseline unable at eval    Time 4    Period Weeks    Status New    Target Date 09/11/20      Long Term Additional Goals   Additional Long Term Goals Yes      OT LONG TERM GOAL #6   Title Patient will demonstrate improvements in handwriting with legiblity greater than 80% for printing and signing name on important papers.    Baseline 50% legiblity at eval, mircrographia noted.    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  Patient presents with festinating gait, forward flexed posture, bradykinesia,  decreased step length with gait patterns, decreased reciprocal arm swing, decreased balance, decreased coordination and mildly decreased muscle strength, decreased cognition/memory, which  affect his ability to perform daily tasks.  The patient is judged to be an excellent candidate for the LSVT BIG program.  He would benefit from and was referred for the LSVT BIG program which is an intensive program designed specifically for Parkinson's patients with a focus on increasing amplitude and speed of movements, improving self-care and daily tasks and providing patients with daily exercises to improve overall function.  It is recommended that the  patient receive the LSVT BIG program, which is comprised of 16 intensive sessions, (4 times a week for 4 weeks, one hour sessions).  Prognosis for improvement is good based on patient's motivation and family support.  LSVT BIG has been documented in the literature as efficacious for individuals with Parkinson's disease.    OT Occupational Profile and History Detailed Assessment- Review of Records and additional review of physical, cognitive, psychosocial history related to current functional performance    Occupational performance deficits (Please refer to evaluation for details): ADL's;IADL's;Play;Leisure;Social Participation    Body Structure / Function / Physical Skills ADL;Dexterity;Flexibility;ROM;Strength;Balance;Coordination;FMC;IADL;Body mechanics;Gait;Endurance;UE functional use;Decreased knowledge of use of DME;GMC;Mobility    Cognitive Skills Attention;Memory;Sequencing    Psychosocial Skills Environmental  Adaptations;Habits;Routines and Behaviors    Rehab Potential Good    Clinical Decision Making Limited treatment options, no task modification necessary    Comorbidities Affecting Occupational Performance: Presence of comorbidities impacting occupational performance    Comorbidities impacting occupational performance description: dementia, chronic afib, h/o seizures, CVA, h/o back surgery/pain, HTN, anklyosing spondylitis    Modification or Assistance to Complete Evaluation  No modification of tasks or assist necessary to complete eval     OT Frequency 4x / week    OT Duration 4 weeks    OT Treatment/Interventions Self-care/ADL training;Cryotherapy;Therapeutic exercise;DME and/or AE instruction;Functional Mobility Training;Cognitive remediation/compensation;Balance training;Neuromuscular education;Gait Training;Manual Therapy;Moist Heat;Therapeutic activities;Patient/family education;Stair Training    Consulted and Agree with Plan of Care Patient;Family member/caregiver    Family Member Consulted Natalia Leatherwood, wife           Patient will benefit from skilled therapeutic intervention in order to improve the following deficits and impairments:   Body Structure / Function / Physical Skills: ADL, Dexterity, Flexibility, ROM, Strength, Balance, Coordination, FMC, IADL, Body mechanics, Gait, Endurance, UE functional use, Decreased knowledge of use of DME, GMC, Mobility Cognitive Skills: Attention, Memory, Sequencing Psychosocial Skills: Environmental  Adaptations, Habits, Routines and Behaviors   Visit Diagnosis: Difficulty in walking, not elsewhere classified  Muscle weakness (generalized)  Other lack of coordination  Abnormal posture  Unsteadiness on feet    Problem List Patient Active Problem List   Diagnosis Date Noted  . Raynaud phenomenon 03/22/2020  . Chronic venous insufficiency 03/22/2020  . B12 deficiency 03/10/2020  . 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 Pseudophakia of left eye 07/02/2018  . Obesity, Class I, BMI 30-34.9 02/15/2018  . Closed displaced fracture of distal phalanx of left middle finger 03/08/2017  . Closed fracture of distal end of left radius with routine healing 01/16/2017  . Closed fracture of nasal bone with routine healing 01/12/2017  . 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  . Closed fracture of dorsal (thoracic) vertebra (HCC) 12/19/2016  . Fall 12/19/2016  . Fracture of left radius 12/19/2016  . Primary osteoarthritis of right knee 10/14/2015   . Acute pain of right knee 09/09/2015  . Aftercare following joint replacement surgery 07/31/2015  . Closed nondisplaced fracture of lateral condyle of femur (HCC) 07/31/2015  . Bilateral edema of lower extremity 06/26/2014  . Chronic atrial fibrillation (HCC) 07/13/2013  . Wound disruption, post-op, skin 10/11/2012  . Cellulitis and abscess 10/05/2012  . Hyperlipidemia 08/17/2012  . Osteoarthritis 08/17/2012  . Seizure disorder (HCC) 08/16/2012  . Hip pain 12/12/2011  . 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 CAD (coronary artery disease) 07/07/2011  . Diabetes mellitus, type 2 (HCC) 07/07/2011  . HTN (hypertension) 07/07/2011   [place] T Taeveon Keesling, OTR/L, CLT  Zaden Sako 08/07/2020, 8:15 AM  [facility] MAIN [facility] SERVICES [address] [place], [place], [address] Phone: [phone]   Fax:  [phone]  Name: Ian King MRN: [id number] Date of Birth: [birth year]

## 2020-08-10 ENCOUNTER — Ambulatory Visit: Payer: Medicare PPO | Admitting: Speech Pathology

## 2020-08-10 ENCOUNTER — Other Ambulatory Visit: Payer: Self-pay

## 2020-08-10 ENCOUNTER — Ambulatory Visit: Payer: Medicare PPO | Admitting: Occupational Therapy

## 2020-08-10 DIAGNOSIS — R41841 Cognitive communication deficit: Secondary | ICD-10-CM

## 2020-08-10 DIAGNOSIS — R2681 Unsteadiness on feet: Secondary | ICD-10-CM

## 2020-08-10 DIAGNOSIS — M6281 Muscle weakness (generalized): Secondary | ICD-10-CM

## 2020-08-10 DIAGNOSIS — R262 Difficulty in walking, not elsewhere classified: Secondary | ICD-10-CM | POA: Diagnosis not present

## 2020-08-10 DIAGNOSIS — R278 Other lack of coordination: Secondary | ICD-10-CM

## 2020-08-10 DIAGNOSIS — R293 Abnormal posture: Secondary | ICD-10-CM

## 2020-08-11 ENCOUNTER — Encounter: Payer: Medicare Other | Admitting: Speech Pathology

## 2020-08-11 ENCOUNTER — Encounter: Payer: Self-pay | Admitting: Speech Pathology

## 2020-08-11 ENCOUNTER — Other Ambulatory Visit: Payer: Self-pay

## 2020-08-11 ENCOUNTER — Ambulatory Visit: Payer: Medicare PPO | Admitting: Occupational Therapy

## 2020-08-11 DIAGNOSIS — R278 Other lack of coordination: Secondary | ICD-10-CM

## 2020-08-11 DIAGNOSIS — R2681 Unsteadiness on feet: Secondary | ICD-10-CM

## 2020-08-11 DIAGNOSIS — M6281 Muscle weakness (generalized): Secondary | ICD-10-CM

## 2020-08-11 DIAGNOSIS — R262 Difficulty in walking, not elsewhere classified: Secondary | ICD-10-CM | POA: Diagnosis not present

## 2020-08-11 DIAGNOSIS — R293 Abnormal posture: Secondary | ICD-10-CM

## 2020-08-11 NOTE — Therapy (Signed)
[place] [facility] MAIN [facility] SERVICES [address] [place], [place], [address] Phone: [phone]   Fax:  [phone]  Speech Language Pathology Treatment  Patient Details  Name: Ian King MRN: [id number] Date of Birth: [birth year] Referring Provider (SLP): Dr. Sherryll Burger   Encounter Date: 08/10/2020   End of Session - 08/11/20 1314    Visit Number 2    Number of Visits 17    Date for SLP Re-Evaluation 10/02/20    Authorization Type Medicare    Authorization Time Period Start 08/05/2020    Authorization - Visit Number 2    Progress Note Due on Visit 10    SLP Start Time 1400    SLP Stop Time  1500    SLP Time Calculation (min) 60 min    Activity Tolerance Patient tolerated treatment well           Past Medical History:  Diagnosis Date  . A-fib (HCC)   . Diabetes mellitus without complication (HCC)   . Hypertension     Past Surgical History:  Procedure Laterality Date  . arm surgery    . BACK SURGERY    . JOINT REPLACEMENT     hip  . JOINT REPLACEMENT Right    knee  . SPINE SURGERY      There were no vitals filed for this visit.   Subjective Assessment - 08/11/20 1229    Subjective pt pleasant but poor historian, accompanied by his wife,    Patient is accompained by: Family member    Currently in Pain? No/denies                 ADULT SLP TREATMENT - 08/11/20 0001      General Information   Behavior/Cognition Alert;Cooperative;Pleasant mood;Confused;Requires cueing    HPI Mr. Pingree is a 79 y.o. male with PMHx of Afib on Xarelto, HLD, former tobacco use, T2DM on metformin, CAD who presented to [facility] 03/08/2020 for evaluation of possible stroke. MRI revealed acute to subacute right ventricle stripe territory infarction, small bilateral acute foci of restricted diffusion at the convexities consistent with acute cortical infarctions and more chronic ischemic changes bilaterally in the posterior cerebral artery territories and  less prominently in the middle cerebral artery territories. MOCA 03/09/2020: 27/30. 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        Treatment Provided   Treatment provided Cognitive-Linquistic      Pain Assessment   Pain Assessment No/denies pain      Cognitive-Linquistic Treatment   Treatment focused on Cognition;Patient/family/caregiver education    Skilled Treatment Maximal multimodal assistance required to recall events from the previous day (traveling to [employer] for granddaughter's birthday party), recalling restaurant they ate at this morning, orientation information and Total assistance for mental flexibility to shift from concrete concepts to more complex/abstract ideas.        Assessment / Recommendations / Plan   Plan Continue with current plan of care      Progression Toward Goals   Progression toward goals Progressing toward goals            SLP Education - 08/11/20 1313  Education Details memory strategies and ways to safely promote pt independence    Person(s) Educated Patient;Spouse    Methods Explanation    Comprehension Verbalized understanding              SLP Long Term Goals - 08/06/20 1307      SLP LONG TERM GOAL #1   Title Patient and his wife will participate in developing functional compensatory strategies to improve engagement and activity.    Time 8    Period Weeks    Status New    Target Date 10/02/20      SLP LONG TERM GOAL #2   Title Patient will demonstrate functional cognitive-communication skills for independent  completion of personal responsibilities and leisure activities.    Time 8    Period Weeks    Status New    Target Date 10/02/20      SLP LONG TERM GOAL #3   Title Patient will generate grammatical and cogent sentence to complete simple/concrete linguistic task with 80% accuracy..    Time 8    Period Weeks    Status New    Target Date 10/02/20      SLP LONG TERM GOAL #4   Title Patient will complete semantic feature word finding tasks with 80% accuracy.    Time 8    Period Weeks    Status New    Target Date 10/02/20            Plan - 08/11/20 1314    Clinical Impression Statement Skilled treatment session targeted pt's cognitive communication goals. SLP facilitated session by introducing memory strategies such as visualization to aid in recall of personal salient activities. Despite Total assistance, pt was not able to retrieve information. Collaborated with pt and his wife on ways to promote greater independence at home during his day.    Speech Therapy Frequency 2x / week    Duration 12 weeks    Treatment/Interventions Cognitive reorganization;Compensatory techniques;SLP instruction and feedback;Patient/family education    Potential to Achieve Goals Good    Potential Considerations Ability to learn/carryover information;Previous level of function;Co-morbidities;Severity of impairments;Cooperation/participation level;Medical prognosis;Family/community support    SLP Home Exercise Plan provided    Consulted and Agree with Plan of Care Patient;Family member/caregiver    Family Member Consulted Spouse           Patient will benefit from skilled therapeutic intervention in order to improve the following deficits and impairments:   Cognitive communication deficit    Problem List Patient Active Problem List   Diagnosis Date Noted  . Raynaud phenomenon 03/22/2020  . Chronic venous insufficiency 03/22/2020  . B12 deficiency 03/10/2020  . Acute ischemic multifocal anterior  circulation stroke (HCC) 03/09/2020  . Pseudophakia of left eye 07/02/2018  . Obesity, Class I, BMI 30-34.9 02/15/2018  . Closed displaced fracture of distal phalanx of left middle finger 03/08/2017  . Closed fracture of distal end of left radius with routine healing 01/16/2017  . Closed fracture of nasal bone with routine healing 01/12/2017  . 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  . Closed fracture of dorsal (thoracic) vertebra (HCC) 12/19/2016  . Fall 12/19/2016  . Fracture of left radius 12/19/2016  . Primary osteoarthritis of right knee 10/14/2015  . Acute pain of right knee 09/09/2015  . Aftercare following joint replacement surgery 07/31/2015  . Closed nondisplaced fracture of lateral condyle of femur (HCC) 07/31/2015  . Bilateral edema of lower extremity 06/26/2014  . Chronic atrial fibrillation (HCC) 07/13/2013  . Wound  disruption, post-op, skin 10/11/2012  . Cellulitis and abscess 10/05/2012  . Hyperlipidemia 08/17/2012  . Osteoarthritis 08/17/2012  . Seizure disorder (HCC) 08/16/2012  . Hip pain 12/12/2011  . 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 CAD (coronary artery disease) 07/07/2011  . Diabetes mellitus, type 2 (HCC) 07/07/2011  . HTN (hypertension) 07/07/2011   Chinenye Katzenberger B. Dreama Saa M.S., CCC-SLP, [facility] Speech-Language Pathologist Rehabilitation Services Office [phone]  Reuel Derby 08/11/2020, 1:16 PM  [place] [facility] MAIN [facility] SERVICES [address] [place], [place], [address] Phone: [phone]   Fax:  [phone]   Name: Ian King MRN: [id number] Date of Birth: August 10, 1941

## 2020-08-12 ENCOUNTER — Ambulatory Visit: Payer: Medicare PPO | Admitting: Speech Pathology

## 2020-08-12 ENCOUNTER — Other Ambulatory Visit: Payer: Self-pay

## 2020-08-12 ENCOUNTER — Encounter: Payer: Self-pay | Admitting: Occupational Therapy

## 2020-08-12 ENCOUNTER — Ambulatory Visit: Payer: Medicare PPO | Admitting: Occupational Therapy

## 2020-08-12 DIAGNOSIS — R262 Difficulty in walking, not elsewhere classified: Secondary | ICD-10-CM | POA: Diagnosis not present

## 2020-08-12 DIAGNOSIS — M6281 Muscle weakness (generalized): Secondary | ICD-10-CM

## 2020-08-12 DIAGNOSIS — R278 Other lack of coordination: Secondary | ICD-10-CM

## 2020-08-12 DIAGNOSIS — R41841 Cognitive communication deficit: Secondary | ICD-10-CM

## 2020-08-12 DIAGNOSIS — R293 Abnormal posture: Secondary | ICD-10-CM

## 2020-08-12 DIAGNOSIS — R2681 Unsteadiness on feet: Secondary | ICD-10-CM

## 2020-08-12 NOTE — Therapy (Signed)
[place] MAIN [facility] SERVICES [address] [place], [place], [address] Phone: [phone]   Fax:  [phone]  Occupational Therapy Treatment  Patient Details  Name: Ian King MRN: [id number] Date of Birth: September 04, 1941 No data recorded  Encounter Date: 08/10/2020   OT End of Session - 08/12/20 1931    Visit Number 2    Number of Visits 17    Date for OT Re-Evaluation 09/11/20    OT Start Time 2446    OT Stop Time 1400    OT Time Calculation (min) 62 min    Activity Tolerance Patient tolerated treatment well    Behavior During Therapy [facility] for tasks assessed/performed           Past Medical History:  Diagnosis Date  . A-fib ([place])   . Diabetes mellitus without complication ([place])   . Hypertension     Past Surgical History:  Procedure Laterality Date  . arm surgery    . BACK SURGERY    . JOINT REPLACEMENT     hip  . JOINT REPLACEMENT Right    knee  . SPINE SURGERY      There were no vitals filed for this visit.   Subjective Assessment - 08/12/20 1929    Subjective  Patient smiling and reports he is ready to work today, wife reports patient was a little anxious about starting therapy and wasn't sure if he wanted to come today.  Worried he wouldn't be able to follow along with exercises.    Patient Stated Goals Patient reports he would like to be as independent as he can for himself.  "I want to do as much as I can."    Currently in Pain? No/denies    Pain Score 0-No pain            Neuromuscular Reeducation to facilitate proper movement patterns following principles of LSVT BIG:  Initial instruction of LSVT Daily Session Maximal Daily Exercises: Sustained movements are designed to rescale the amplitude of movement output for generalization to daily functional activities. Performed as follows for 1 set of 10 repetitions each: Multi directional sustained movements- 1) Floor to ceiling, 2) Side to side. Multi directional  Repetitive movements performed in standing and are designed to provide retraining effort needed for sustained muscle activation in tasks Performed as follows: 3) Step and reach forward, 4) Step and Reach Backwards, 5) Step and reach sideways, 6) Rock and reach forward/backward, 7) Rock and reach sideways.  Patient instructed on exercises in a standard version with therapist providing moderate assist for balance and proper form and technique.  Pt benefits from slow instruction, repetition along with verbal and tactile cues.  Functional mobility for one set of 300 feet, utilizing cane and cues for reciprocal arm swing on the left.    Berg Balance Test: 34/56  Response to tx: Patient seen for BERG balance test with results indicating fall risk.  Patient seen for initial instruction for LSVT BIG maximal daily exercises with moderate assist from therapist.  Patient with small steps with exercises and requires assist and encouragement to take larger steps.  Pt benefits from repetition of tasks secondary to memory issues.  Will continue to assess performance however, pt will likely require adapted version of exercises for home program for safety.  Continue with goals to maximize safety and independence in necessary daily tasks.  OT Education - 08/12/20 1930    Education provided Yes    Education Details LSVT BIG exercises, balance    Person(s) Educated Patient;Spouse    Methods Explanation;Demonstration    Comprehension Verbalized understanding;Returned demonstration;Need further instruction            OT Short Term Goals - 04/07/17 1555      OT SHORT TERM GOAL #1   Title Pt to be ind in HEP to increase AROM in digits and wrist - splint wearing for [place]/wrist     Status Achieved      OT SHORT TERM GOAL #2   Title AROM in L 3rd digit improve for pt to touch palm without extention lag more than -10 degrees      Baseline extention lag -15 degrees     Status  Partially Met      OT SHORT TERM GOAL #3   Title Pain on PRWHE improve with 15 points     Baseline at eval pain on PRWHE 26/50- pain now less than 5/50     Status Achieved      OT SHORT TERM GOAL #4   Title L wrist AROM improve for pt turn page, take change in palm, eat 50% with L hand     Status Achieved             OT Long Term Goals - 08/07/20 0754      OT LONG TERM GOAL #1   Title Patient will improve gait speed and endurance to be able to ambulate 900 feet in 6 minutes to negotiate around the home and community safely in 4 weeks.    Baseline 740 feet at eval    Time 4    Period Weeks    Status New    Target Date 09/11/20      OT LONG TERM GOAL #2   Title Patient will complete HEP for maximal daily exercises with min assist in 4 weeks.    Baseline no current program    Time 4    Period Weeks    Status New    Target Date 09/11/20      OT LONG TERM GOAL #3   Title Patient will transfer from to sit to stand without the use of arms safely and independently from a variety of chairs/surfaces in 4 weeks.    Baseline difficulty with low surfaces    Time 4    Period Weeks    Status New    Target Date 09/11/20      OT LONG TERM GOAL #4   Title Will assess BERG balance next session    Baseline unable to perform at eval due to time constraints, patient has functional balance deficits during eval    Time 1    Period Weeks    Status New    Target Date 08/14/20      OT LONG TERM GOAL #5   Title Patient will complete light meal prep with supervision.    Baseline unable at eval    Time 4    Period Weeks    Status New    Target Date 09/11/20      Long Term Additional Goals   Additional Long Term Goals Yes      OT LONG TERM GOAL #6   Title Patient will demonstrate improvements in handwriting with legiblity greater than 80% for printing and signing name on important papers.    Baseline 50% legiblity at eval, mircrographia noted.  Time 4    Period Weeks    Status  New    Target Date 09/11/20                 Plan - 08/12/20 1932    Clinical Impression Statement Patient seen for BERG balance test with results indicating fall risk.  Patient seen for initial instruction for LSVT BIG maximal daily exercises with moderate assist from therapist.  Patient with small steps with exercises and requires assist and encouragement to take larger steps.  Pt benefits from repetition of tasks secondary to memory issues.  Will continue to assess performance however, pt will likely require adapted version of exercises for home program for safety.  Continue with goals to maximize safety and independence in necessary daily tasks.    OT Occupational Profile and History Detailed Assessment- Review of Records and additional review of physical, cognitive, psychosocial history related to current functional performance    Occupational performance deficits (Please refer to evaluation for details): ADL's;IADL's;Play;Leisure;Social Participation    Body Structure / Function / Physical Skills ADL;Dexterity;Flexibility;ROM;Strength;Balance;Coordination;FMC;IADL;Body mechanics;Gait;Endurance;UE functional use;Decreased knowledge of use of DME;GMC;Mobility    Cognitive Skills Attention;Memory;Sequencing    Psychosocial Skills Environmental  Adaptations;Habits;Routines and Behaviors    Rehab Potential Good    Clinical Decision Making Limited treatment options, no task modification necessary    Comorbidities Affecting Occupational Performance: Presence of comorbidities impacting occupational performance    Comorbidities impacting occupational performance description: dementia, chronic afib, h/o seizures, CVA, h/o back surgery/pain, HTN, anklyosing spondylitis    Modification or Assistance to Complete Evaluation  No modification of tasks or assist necessary to complete eval    OT Frequency 4x / week    OT Duration 4 weeks    OT Treatment/Interventions Self-care/ADL  training;Cryotherapy;Therapeutic exercise;DME and/or AE instruction;Functional Mobility Training;Cognitive remediation/compensation;Balance training;Neuromuscular education;Gait Training;Manual Therapy;Moist Heat;Therapeutic activities;Patient/family education;Stair Training    Consulted and Agree with Plan of Care Patient;Family member/caregiver    Family Member Consulted Belenda Cruise, wife           Patient will benefit from skilled therapeutic intervention in order to improve the following deficits and impairments:   Body Structure / Function / Physical Skills: ADL, Dexterity, Flexibility, ROM, Strength, Balance, Coordination, FMC, IADL, Body mechanics, Gait, Endurance, UE functional use, Decreased knowledge of use of DME, GMC, Mobility Cognitive Skills: Attention, Memory, Sequencing Psychosocial Skills: Environmental  Adaptations, Habits, Routines and Behaviors   Visit Diagnosis: Muscle weakness (generalized)  Other lack of coordination  Difficulty in walking, not elsewhere classified  Unsteadiness on feet  Abnormal posture    Problem List Patient Active Problem List   Diagnosis Date Noted  . Raynaud phenomenon 03/22/2020  . Chronic venous insufficiency 03/22/2020  . B12 deficiency 03/10/2020  . Acute ischemic multifocal anterior circulation stroke ([place]) 03/09/2020  . Pseudophakia of left eye 07/02/2018  . Obesity, Class I, BMI 30-34.9 02/15/2018  . Closed displaced fracture of distal phalanx of left middle finger 03/08/2017  . Closed fracture of distal end of left radius with routine healing 01/16/2017  . Closed fracture of nasal bone with routine healing 01/12/2017  . Ankylosing spondylitis ([place]) 12/19/2016  . Closed fracture of dorsal (thoracic) vertebra ([place]) 12/19/2016  . Fall 12/19/2016  . Fracture of left radius 12/19/2016  . Primary osteoarthritis of right knee 10/14/2015  . Acute pain of right knee 09/09/2015  . Aftercare following joint replacement surgery  07/31/2015  . Closed nondisplaced fracture of lateral condyle of femur ([place]) 07/31/2015  . Bilateral edema of lower  extremity 06/26/2014  . Chronic atrial fibrillation ([place]) 07/13/2013  . Wound disruption, post-op, skin 10/11/2012  . Cellulitis and abscess 10/05/2012  . Hyperlipidemia 08/17/2012  . Osteoarthritis 08/17/2012  . Seizure disorder ([place]) 08/16/2012  . Hip pain 12/12/2011  . 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 CAD (coronary artery disease) 07/07/2011  . Diabetes mellitus, type 2 ([place]) 07/07/2011  . HTN (hypertension) 07/07/2011   Einer Meals T Tomasita Morrow, OTR/L, CLT  Hildagard Sobecki 08/12/2020, 7:49 PM  [place] MAIN [facility] SERVICES [address] [place], [place], [address] Phone: [phone]   Fax:  [phone]  Name: ZACK CRAGER MRN: [id number] Date of Birth: 08-Mar-1941

## 2020-08-13 ENCOUNTER — Other Ambulatory Visit: Payer: Self-pay

## 2020-08-13 ENCOUNTER — Encounter: Payer: Self-pay | Admitting: Speech Pathology

## 2020-08-13 ENCOUNTER — Ambulatory Visit: Payer: Medicare PPO | Admitting: Occupational Therapy

## 2020-08-13 ENCOUNTER — Ambulatory Visit: Payer: Medicare PPO | Admitting: Speech Pathology

## 2020-08-13 DIAGNOSIS — R262 Difficulty in walking, not elsewhere classified: Secondary | ICD-10-CM | POA: Diagnosis not present

## 2020-08-13 DIAGNOSIS — M6281 Muscle weakness (generalized): Secondary | ICD-10-CM

## 2020-08-13 DIAGNOSIS — R2681 Unsteadiness on feet: Secondary | ICD-10-CM

## 2020-08-13 DIAGNOSIS — R293 Abnormal posture: Secondary | ICD-10-CM

## 2020-08-13 DIAGNOSIS — R278 Other lack of coordination: Secondary | ICD-10-CM

## 2020-08-13 NOTE — Therapy (Signed)
[place] [facility] MAIN [facility] SERVICES [address] [place], [place], [address] Phone: [phone]   Fax:  [phone]  Speech Language Pathology Treatment  Patient Details  Name: Ian King MRN: [id number] Date of Birth: [birth year] Referring Provider (SLP): Dr. Sherryll Burger   Encounter Date: 08/12/2020   End of Session - 08/13/20 1823    Visit Number 3    Number of Visits 17    Date for SLP Re-Evaluation 10/02/20    Authorization Type Medicare    Authorization Time Period Start 08/05/2020    Authorization - Visit Number 3    Progress Note Due on Visit 10    SLP Start Time 1415    SLP Stop Time  1515    SLP Time Calculation (min) 60 min    Activity Tolerance Patient tolerated treatment well           Past Medical History:  Diagnosis Date  . A-fib ([facility])   . Diabetes mellitus without complication ([facility])   . Hypertension     Past Surgical History:  Procedure Laterality Date  . arm surgery    . BACK SURGERY    . JOINT REPLACEMENT     hip  . JOINT REPLACEMENT Right    knee  . SPINE SURGERY      There were no vitals filed for this visit.   Subjective Assessment - 08/13/20 1806    Subjective pt pleasant, tangential    Patient is accompained by: Family member    Currently in Pain? No/denies                   SLP Education - 08/13/20 1823    Education Details cognitive organization and attention to tasks    Person(s) Educated Patient;Spouse    Methods Explanation;Demonstration    Comprehension Need further instruction              SLP Long Term Goals - 08/06/20 1307      SLP LONG TERM GOAL #1   Title Patient and his wife will participate in developing functional compensatory strategies to improve engagement and activity.    Time 8    Period Weeks    Status New    Target Date 10/02/20      SLP LONG TERM GOAL #2   Title Patient will demonstrate functional cognitive-communication skills for independent completion of  personal responsibilities and leisure activities.    Time 8    Period Weeks    Status New    Target Date 10/02/20      SLP LONG TERM GOAL #3   Title Patient will generate grammatical and cogent sentence to complete simple/concrete linguistic task with 80% accuracy..    Time 8    Period Weeks    Status New    Target Date 10/02/20      SLP LONG TERM GOAL #4   Title Patient will complete semantic feature word finding tasks with 80% accuracy.    Time 8    Period Weeks    Status New    Target Date 10/02/20            Plan - 08/13/20 1824    Clinical Impression Statement Skilled treatment session targeted pt's cognitive communication goals. SLP facilitated session by providing moderate cues for redirection to tasks d/t pt's tangential comments. Pt required maximal faded to minimal cues to list items within a category d/t concrete cognitive abilities.    Speech Therapy Frequency 2x /  week    Duration 12 weeks    Treatment/Interventions Cognitive reorganization;Compensatory techniques;SLP instruction and feedback;Patient/family education    Potential to Achieve Goals Good    Potential Considerations Ability to learn/carryover information;Previous level of function;Co-morbidities;Severity of impairments;Cooperation/participation level;Medical prognosis;Family/community support    SLP Home Exercise Plan independently perform morning ADLs    Consulted and Agree with Plan of Care Patient;Family member/caregiver    Family Member Consulted Spouse           Patient will benefit from skilled therapeutic intervention in order to improve the following deficits and impairments:   Cognitive communication deficit    Problem List Patient Active Problem List   Diagnosis Date Noted  . Raynaud phenomenon 03/22/2020  . Chronic venous insufficiency 03/22/2020  . B12 deficiency 03/10/2020  . 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 Pseudophakia of left eye  07/02/2018  . Obesity, Class I, BMI 30-34.9 02/15/2018  . Closed displaced fracture of distal phalanx of left middle finger 03/08/2017  . Closed fracture of distal end of left radius with routine healing 01/16/2017  . Closed fracture of nasal bone with routine healing 01/12/2017  . 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  . Closed fracture of dorsal (thoracic) vertebra ([facility]) 12/19/2016  . Fall 12/19/2016  . Fracture of left radius 12/19/2016  . Primary osteoarthritis of right knee 10/14/2015  . Acute pain of right knee 09/09/2015  . Aftercare following joint replacement surgery 07/31/2015  . Closed nondisplaced fracture of lateral condyle of femur (H[facility]) 07/31/2015  . Bilateral edema of lower extremity 06/26/2014  . Chronic atrial fibrillation ([facility]) 07/13/2013  . Wound disruption, post-op, skin 10/11/2012  . Cellulitis and abscess 10/05/2012  . Hyperlipidemia 08/17/2012  . Osteoarthritis 08/17/2012  . Seizure disorder ([facility]CC) 08/16/2012  . Hip pain 12/12/2011  . 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 CAD (coronary artery disease) 07/07/2011  . Diabetes mellitus, type 2 ([facility]) 07/07/2011  . HTN (hypertension) 07/07/2011    Ian King 08/13/2020, 6:25 PM  [place] [facility] MAIN [facility] SERVICES [address] [place], [place], [address] Phone: [phone]   Fax:  [phone]   Name: Ian King MRN: [id number] Date of Birth: April 13, 1941

## 2020-08-14 NOTE — Therapy (Signed)
[address] MAIN [facility] SERVICES [address] [place], [place], [address] Phone: [phone]   Fax:  [phone]  Occupational Therapy Treatment  Patient Details  Name: Ian King MRN: [id number] Date of Birth: 01/18/1941 No data recorded  Encounter Date: 08/11/2020   OT End of Session - 08/14/20 0836    Visit Number 3    Number of Visits 17    Date for OT Re-Evaluation 09/11/20    OT Start Time 1300    OT Stop Time 1359    OT Time Calculation (min) 59 min    Activity Tolerance Patient tolerated treatment well    Behavior During Therapy [facility] for tasks assessed/performed           Past Medical History:  Diagnosis Date  . A-fib ([place])   . Diabetes mellitus without complication ([place])   . Hypertension     Past Surgical History:  Procedure Laterality Date  . arm surgery    . BACK SURGERY    . JOINT REPLACEMENT     hip  . JOINT REPLACEMENT Right    knee  . SPINE SURGERY      There were no vitals filed for this visit.   Subjective Assessment - 08/14/20 0832    Subjective  Patient reports he has some soreness from exercises but not pain, "I know we worked yesterday."  Pt able to recall therapist's name today and recalls doing exercises in the last session but does not recall specifics about the exercises.    Patient Stated Goals Patient reports he would like to be as independent as he can for himself.  "I want to do as much as I can."    Currently in Pain? No/denies    Pain Score 0-No pain           Neuromuscular Reeducation to facilitate proper movement patterns following principles of LSVT BIG:  Initial instruction of LSVT Daily Session Maximal Daily Exercises: Sustained movements are designed to rescale the amplitude of movement output for generalization to daily functional activities. Performed as follows for 1 set of 10 repetitions each: Multi directional sustained movements- 1) Floor to ceiling, 2) Side to side. Multi  directional Repetitive movements performed in standing and are designed to provide retraining effort needed for sustained muscle activation in tasks Performed as follows: 3) Step and reach forward, 4) Step and Reach Backwards, 5) Step and reach sideways, 6) Rock and reach forward/backward, 7) Rock and reach sideways. Patient instructed on exercises in a standard version with therapist providing moderate assist for balance and proper form and technique.  Pt continues to benefit from slow instruction, repetition along with verbal and tactile cues. Given balance deficits, patient was introduced to adapted version of exercises this date for home program.  Adapted version uses a chair for safety especially for patient to perform exercises at home.   Sit to stand 10 times from mat table with cues for weight shift forwards.    Functional mobility for 2 sets of 200 feet, utilizing cane and cues for reciprocal arm swing on the left. Patient able to recall arm swing from session yesterday but required cue to initiate.    Response to tx:   Patient is progressing well, although he has memory issues, he responds well to therapist demonstration and cues and was familiar this date with exercises performed yesterday.  Patient reports some mild soreness from starting exercise program but no complaints of pain.  Patient continues to require  moderate assist for exercises in standard version with therapist.  For safety with home program he will benefit from the adapted version at home.  Will continue to work towards goals to maximize safety and independence in daily tasks.                        OT Education - 08/14/20 0836    Education provided Yes    Education Details LSVT BIG exercises, balance    Person(s) Educated Patient;Spouse    Methods Explanation;Demonstration    Comprehension Verbalized understanding;Returned demonstration;Need further instruction            OT Short Term Goals -  04/07/17 1555      OT SHORT TERM GOAL #1   Title Pt to be ind in HEP to increase AROM in digits and wrist - splint wearing for [place]/wrist     Status Achieved      OT SHORT TERM GOAL #2   Title AROM in L 3rd digit improve for pt to touch palm without extention lag more than -10 degrees      Baseline extention lag -15 degrees     Status Partially Met      OT SHORT TERM GOAL #3   Title Pain on PRWHE improve with 15 points     Baseline at eval pain on PRWHE 26/50- pain now less than 5/50     Status Achieved      OT SHORT TERM GOAL #4   Title L wrist AROM improve for pt turn page, take change in palm, eat 50% with L hand     Status Achieved             OT Long Term Goals - 08/07/20 0754      OT LONG TERM GOAL #1   Title Patient will improve gait speed and endurance to be able to ambulate 900 feet in 6 minutes to negotiate around the home and community safely in 4 weeks.    Baseline 740 feet at eval    Time 4    Period Weeks    Status New    Target Date 09/11/20      OT LONG TERM GOAL #2   Title Patient will complete HEP for maximal daily exercises with min assist in 4 weeks.    Baseline no current program    Time 4    Period Weeks    Status New    Target Date 09/11/20      OT LONG TERM GOAL #3   Title Patient will transfer from to sit to stand without the use of arms safely and independently from a variety of chairs/surfaces in 4 weeks.    Baseline difficulty with low surfaces    Time 4    Period Weeks    Status New    Target Date 09/11/20      OT LONG TERM GOAL #4   Title Will assess BERG balance next session    Baseline unable to perform at eval due to time constraints, patient has functional balance deficits during eval    Time 1    Period Weeks    Status New    Target Date 08/14/20      OT LONG TERM GOAL #5   Title Patient will complete light meal prep with supervision.    Baseline unable at eval    Time 4    Period Weeks    Status New  Target Date  09/11/20      Long Term Additional Goals   Additional Long Term Goals Yes      OT LONG TERM GOAL #6   Title Patient will demonstrate improvements in handwriting with legiblity greater than 80% for printing and signing name on important papers.    Baseline 50% legiblity at eval, mircrographia noted.    Time 4    Period Weeks    Status New    Target Date 09/11/20                 Plan - 08/14/20 5465    Clinical Impression Statement Patient is progressing well, although he has memory issues, he responds well to therapist demonstration and cues and was familiar this date with exercises performed yesterday.  Patient reports some mild soreness from starting exercise program but no complaints of pain.  Patient continues to require moderate assist for exercises in standard version with therapist.  For safety with home program he will benefit from the adapted version at home.  Will continue to work towards goals to maximize safety and independence in daily tasks.    OT Occupational Profile and History Detailed Assessment- Review of Records and additional review of physical, cognitive, psychosocial history related to current functional performance    Occupational performance deficits (Please refer to evaluation for details): ADL's;IADL's;Play;Leisure;Social Participation    Body Structure / Function / Physical Skills ADL;Dexterity;Flexibility;ROM;Strength;Balance;Coordination;FMC;IADL;Body mechanics;Gait;Endurance;UE functional use;Decreased knowledge of use of DME;GMC;Mobility    Cognitive Skills Attention;Memory;Sequencing    Psychosocial Skills Environmental  Adaptations;Habits;Routines and Behaviors    Rehab Potential Good    Clinical Decision Making Limited treatment options, no task modification necessary    Comorbidities Affecting Occupational Performance: Presence of comorbidities impacting occupational performance    Comorbidities impacting occupational performance description:  dementia, chronic afib, h/o seizures, CVA, h/o back surgery/pain, HTN, anklyosing spondylitis    Modification or Assistance to Complete Evaluation  No modification of tasks or assist necessary to complete eval    OT Frequency 4x / week    OT Duration 4 weeks    OT Treatment/Interventions Self-care/ADL training;Cryotherapy;Therapeutic exercise;DME and/or AE instruction;Functional Mobility Training;Cognitive remediation/compensation;Balance training;Neuromuscular education;Gait Training;Manual Therapy;Moist Heat;Therapeutic activities;Patient/family education;Stair Training    Consulted and Agree with Plan of Care Patient;Family member/caregiver    Family Member Consulted Belenda Cruise, wife           Patient will benefit from skilled therapeutic intervention in order to improve the following deficits and impairments:   Body Structure / Function / Physical Skills: ADL, Dexterity, Flexibility, ROM, Strength, Balance, Coordination, FMC, IADL, Body mechanics, Gait, Endurance, UE functional use, Decreased knowledge of use of DME, GMC, Mobility Cognitive Skills: Attention, Memory, Sequencing Psychosocial Skills: Environmental  Adaptations, Habits, Routines and Behaviors   Visit Diagnosis: Unsteadiness on feet  Muscle weakness (generalized)  Other lack of coordination  Difficulty in walking, not elsewhere classified  Abnormal posture    Problem List Patient Active Problem List   Diagnosis Date Noted  . Raynaud phenomenon 03/22/2020  . Chronic venous insufficiency 03/22/2020  . B12 deficiency 03/10/2020  . Acute ischemic multifocal anterior circulation stroke ([place]) 03/09/2020  . Pseudophakia of left eye 07/02/2018  . Obesity, Class I, BMI 30-34.9 02/15/2018  . Closed displaced fracture of distal phalanx of left middle finger 03/08/2017  . Closed fracture of distal end of left radius with routine healing 01/16/2017  . Closed fracture of nasal bone with routine healing 01/12/2017  .  Ankylosing spondylitis ([place]) 12/19/2016  .  Closed fracture of dorsal (thoracic) vertebra ([place]) 12/19/2016  . Fall 12/19/2016  . Fracture of left radius 12/19/2016  . Primary osteoarthritis of right knee 10/14/2015  . Acute pain of right knee 09/09/2015  . Aftercare following joint replacement surgery 07/31/2015  . Closed nondisplaced fracture of lateral condyle of femur ([place]) 07/31/2015  . Bilateral edema of lower extremity 06/26/2014  . Chronic atrial fibrillation ([place]) 07/13/2013  . Wound disruption, post-op, skin 10/11/2012  . Cellulitis and abscess 10/05/2012  . Hyperlipidemia 08/17/2012  . Osteoarthritis 08/17/2012  . Seizure disorder ([place]) 08/16/2012  . Hip pain 12/12/2011  . 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 CAD (coronary artery disease) 07/07/2011  . Diabetes mellitus, type 2 ([place]) 07/07/2011  . HTN (hypertension) 07/07/2011   Mujtaba Bollig T Tomasita Morrow, OTR/L, CLT  Dior Stepter 08/14/2020, 9:00 AM  [place] MAIN [facility] SERVICES [address] [place], [place], [address] Phone: [phone]   Fax:  [phone]  Name: Ian King MRN: [id number] Date of Birth: 05-29-1941

## 2020-08-17 ENCOUNTER — Ambulatory Visit: Payer: Medicare PPO | Admitting: Occupational Therapy

## 2020-08-17 ENCOUNTER — Encounter: Payer: Self-pay | Admitting: Speech Pathology

## 2020-08-17 ENCOUNTER — Ambulatory Visit: Payer: Medicare PPO | Admitting: Speech Pathology

## 2020-08-17 ENCOUNTER — Other Ambulatory Visit: Payer: Self-pay

## 2020-08-17 DIAGNOSIS — R293 Abnormal posture: Secondary | ICD-10-CM

## 2020-08-17 DIAGNOSIS — R262 Difficulty in walking, not elsewhere classified: Secondary | ICD-10-CM | POA: Diagnosis not present

## 2020-08-17 DIAGNOSIS — M6281 Muscle weakness (generalized): Secondary | ICD-10-CM

## 2020-08-17 DIAGNOSIS — R278 Other lack of coordination: Secondary | ICD-10-CM

## 2020-08-17 DIAGNOSIS — R2681 Unsteadiness on feet: Secondary | ICD-10-CM

## 2020-08-17 DIAGNOSIS — R41841 Cognitive communication deficit: Secondary | ICD-10-CM

## 2020-08-17 NOTE — Therapy (Signed)
[facility] MAIN [facility] SERVICES [address] [place], [place], [address] Phone: [phone]   Fax:  [phone]  Speech Language Pathology Treatment  Patient Details  Name: Ian King MRN: [id number] Date of Birth: 22-Feb-1941 Referring Provider (SLP): Dr. Sherryll Burger   Encounter Date: 08/17/2020   End of Session - 08/17/20 1656    Visit Number 4    Number of Visits 17    Date for SLP Re-Evaluation 10/02/20    Authorization Type Medicare    Authorization Time Period Start 08/05/2020    Authorization - Visit Number 4    Progress Note Due on Visit 10    SLP Start Time 1400    SLP Stop Time  1500    SLP Time Calculation (min) 60 min    Activity Tolerance Patient tolerated treatment well           Past Medical History:  Diagnosis Date  . A-fib ([facility])   . Diabetes mellitus without complication ([facility])   . Hypertension     Past Surgical History:  Procedure Laterality Date  . arm surgery    . BACK SURGERY    . JOINT REPLACEMENT     hip  . JOINT REPLACEMENT Right    knee  . SPINE SURGERY      There were no vitals filed for this visit.   Subjective Assessment - 08/17/20 1655    Subjective pt pleasant, tangential    Patient is accompained by: Family member    Currently in Pain? No/denies                 ADULT SLP TREATMENT - 08/17/20 0001      General Information   Behavior/Cognition Alert;Cooperative;Pleasant mood;Confused;Requires cueing;Decreased sustained attention;Distractible    HPI Ian King is a 79 y.o. male with PMHx of Afib on Xarelto, HLD, former tobacco use, T2DM on metformin, CAD who presented to [facility] 03/08/2020 for evaluation of possible stroke. MRI revealed acute to subacute right ventricle stripe territory infarction, small bilateral acute foci of restricted diffusion at the convexities consistent with acute cortical infarctions and more chronic ischemic changes bilaterally in the posterior cerebral artery  territories and less prominently in the middle cerebral artery territories. MOCA 03/09/2020: 27/30. 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        Treatment Provided   Treatment provided Cognitive-Linquistic      Pain Assessment   Pain Assessment No/denies pain      Cognitive-Linquistic Treatment   Treatment focused on Cognition;Patient/family/caregiver education;Voice    Skilled Treatment Maximal multimodal assistance required to focus attention on basic object and therapist      Assessment / Recommendations / Plan   Plan Continue with current plan of care      Progression Toward Goals   Progression toward goals Progressing toward goals            SLP Education - 08/17/20 1656    Education Details focused attention to task    Person(s) Educated Patient;Spouse    Methods Explanation;Demonstration;Tactile cues;Verbal cues    Comprehension Need further instruction;Tactile cues required;Verbal cues required  SLP Long Term Goals - 08/06/20 1307      SLP LONG TERM GOAL #1   Title Patient and his wife will participate in developing functional compensatory strategies to improve engagement and activity.    Time 8    Period Weeks    Status New    Target Date 10/02/20      SLP LONG TERM GOAL #2   Title Patient will demonstrate functional cognitive-communication skills for independent completion of personal responsibilities and leisure activities.    Time 8    Period Weeks    Status New    Target Date 10/02/20      SLP LONG TERM GOAL #3    Title Patient will generate grammatical and cogent sentence to complete simple/concrete linguistic task with 80% accuracy..    Time 8    Period Weeks    Status New    Target Date 10/02/20      SLP LONG TERM GOAL #4   Title Patient will complete semantic feature word finding tasks with 80% accuracy.    Time 8    Period Weeks    Status New    Target Date 10/02/20            Plan - 08/17/20 1656    Clinical Impression Statement Skilled treatment session focused on pt's cognitive communication goals. SLP facilitated session by providing basic sorting tasks. SLP provided maximal multi-modal cues to promote brief moments of focused attention to task at hand. When pt is not able to focus attention and is distracted, his language organization decreases (substitutes words) that perpetuate his confusion. With total assistance, pt was not accurate with sorting task. Education began about areas of cognitive impairment such as attention and hierarchy of skills to be targeted. More education is needed for pt and his wife.    Speech Therapy Frequency 2x / week    Duration 12 weeks    Treatment/Interventions Cognitive reorganization;Compensatory techniques;SLP instruction and feedback;Patient/family education    Potential to Achieve Goals Good    Potential Considerations Ability to learn/carryover information;Previous level of function;Co-morbidities;Severity of impairments;Cooperation/participation level;Medical prognosis;Family/community support    SLP Home Exercise Plan independently perform morning ADLs with focus on attention to task    Consulted and Agree with Plan of Care Patient;Family member/caregiver    Family Member Consulted Spouse           Patient will benefit from skilled therapeutic intervention in order to improve the following deficits and impairments:   Cognitive communication deficit    Problem List Patient Active Problem List   Diagnosis Date Noted  . Raynaud phenomenon  03/22/2020  . Chronic venous insufficiency 03/22/2020  . B12 deficiency 03/10/2020  . 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 Pseudophakia of left eye 07/02/2018  . Obesity, Class I, BMI 30-34.9 02/15/2018  . Closed displaced fracture of distal phalanx of left middle finger 03/08/2017  . Closed fracture of distal end of left radius with routine healing 01/16/2017  . Closed fracture of nasal bone with routine healing 01/12/2017  . 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  . Closed fracture of dorsal (thoracic) vertebra ([facility]) 12/19/2016  . Fall 12/19/2016  . Fracture of left radius 12/19/2016  . Primary osteoarthritis of right knee 10/14/2015  . Acute pain of right knee 09/09/2015  . Aftercare following joint replacement surgery 07/31/2015  . Closed nondisplaced fracture of lateral condyle of femur (H[facility]) 07/31/2015  . Bilateral edema of lower extremity 06/26/2014  . Chronic atrial fibrillation (  HCC) 07/13/2013  . Wound disruption, post-op, skin 10/11/2012  . Cellulitis and abscess 10/05/2012  . Hyperlipidemia 08/17/2012  . Osteoarthritis 08/17/2012  . Seizure disorder (H[facility]) 08/16/2012  . Hip pain 12/12/2011  . 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 CAD (coronary artery disease) 07/07/2011  . Diabetes mellitus, type 2 (HCC) 07/07/2011  . HTN (hypertension) 07/07/2011   Syble Picco B. Dreama Saa M.S., CCC-SLP, [facility] Speech-Language Pathologist Rehabilitation Services Office [phone]  Reuel Derby 08/17/2020, 4:59 PM  [facility] MAIN [facility] SERVICES [address] [place], [place], [address] Phone: [phone]   Fax:  [phone]   Name: Ian King MRN: [id number] Date of Birth: 23-Apr-1941

## 2020-08-18 ENCOUNTER — Ambulatory Visit: Payer: Medicare PPO | Admitting: Speech Pathology

## 2020-08-18 ENCOUNTER — Ambulatory Visit: Payer: Medicare PPO | Admitting: Occupational Therapy

## 2020-08-18 ENCOUNTER — Other Ambulatory Visit: Payer: Self-pay

## 2020-08-18 DIAGNOSIS — M6281 Muscle weakness (generalized): Secondary | ICD-10-CM

## 2020-08-18 DIAGNOSIS — R262 Difficulty in walking, not elsewhere classified: Secondary | ICD-10-CM | POA: Diagnosis not present

## 2020-08-18 DIAGNOSIS — R278 Other lack of coordination: Secondary | ICD-10-CM

## 2020-08-18 DIAGNOSIS — R293 Abnormal posture: Secondary | ICD-10-CM

## 2020-08-18 DIAGNOSIS — R2681 Unsteadiness on feet: Secondary | ICD-10-CM

## 2020-08-19 ENCOUNTER — Other Ambulatory Visit: Payer: Self-pay

## 2020-08-19 ENCOUNTER — Encounter: Payer: Self-pay | Admitting: Occupational Therapy

## 2020-08-19 ENCOUNTER — Ambulatory Visit: Payer: Medicare PPO | Admitting: Occupational Therapy

## 2020-08-19 ENCOUNTER — Ambulatory Visit: Payer: Medicare PPO | Admitting: Speech Pathology

## 2020-08-19 DIAGNOSIS — M6281 Muscle weakness (generalized): Secondary | ICD-10-CM

## 2020-08-19 DIAGNOSIS — R262 Difficulty in walking, not elsewhere classified: Secondary | ICD-10-CM | POA: Diagnosis not present

## 2020-08-19 DIAGNOSIS — R41841 Cognitive communication deficit: Secondary | ICD-10-CM

## 2020-08-19 DIAGNOSIS — R293 Abnormal posture: Secondary | ICD-10-CM

## 2020-08-19 DIAGNOSIS — R278 Other lack of coordination: Secondary | ICD-10-CM

## 2020-08-19 DIAGNOSIS — R2681 Unsteadiness on feet: Secondary | ICD-10-CM

## 2020-08-19 NOTE — Therapy (Signed)
[facility] MAIN [facility] SERVICES [address] [place], [place], [address] Phone: [phone]   Fax:  [phone]  Occupational Therapy Treatment  Patient Details  Name: Ian King MRN: [id number] Date of Birth: May 05, 1941 No data recorded  Encounter Date: 08/12/2020   OT End of Session - 08/19/20 1628    Visit Number 4    Number of Visits 17    Date for OT Re-Evaluation 09/11/20    OT Start Time 1258    OT Stop Time 1400    OT Time Calculation (min) 62 min    Activity Tolerance Patient tolerated treatment well    Behavior During Therapy [facility] for tasks assessed/performed           Past Medical History:  Diagnosis Date  . A-fib (HCC)   . Diabetes mellitus without complication (HCC)   . Hypertension     Past Surgical History:  Procedure Laterality Date  . arm surgery    . BACK SURGERY    . JOINT REPLACEMENT     hip  . JOINT REPLACEMENT Right    knee  . SPINE SURGERY      There were no vitals filed for this visit.   Subjective Assessment - 08/19/20 1626    Subjective  Patient reports he is doing well, no complaints, states, "I know I have a hard time remembering the exercises."    Patient is accompanied by: Family member    Patient Stated Goals Patient reports he would like to be as independent as he can for himself.  "I want to do as much as I can."    Currently in Pain? No/denies    Pain Score 0-No pain           Neuromuscular Reeducation to facilitate proper movement patterns following principles of LSVT BIG:  Performance of LSVT Daily Session Maximal Daily Exercises: Sustained movements are designed to rescale the amplitude of movement output for generalization to daily functional activities. Performed as follows for 1 set of 10 repetitions each: Multi directional sustained movements- 1) Floor to ceiling, 2) Side to side. Multi directional Repetitive movements performed in standing and are designed to provide  retraining effort needed for sustained muscle activation in tasks Performed as follows: 3) Step and reach forward, 4) Step and Reach Backwards, 5) Step and reach sideways, 6) Rock and reach forward/backward, 7) Rock and reach sideways. Patient instructed on exercises in a standard version with therapist providing moderate assist for balance and proper form and technique. Pt continues to benefit from slow instruction, repetition along with verbal and tactile cues. Adapted version of exercises performed this date in the clinic.  Sit to stand 10 times from mat table with cues for weight shift forwards, mat at lowest position this date.  Functional mobility for 1 set of 500 feet, utilizing cane and cues for reciprocal arm swing on the left. Tactile cues to initiate left arm swing.    Functional component tasks:  Discussion with patient and wife on tasks that are difficult at home, they will continue to think over the next day on additional tasks. 1) sit to stand from lower surfaces 2) reaching back with left hand to wipe after toileting 3) Handwriting pen strokes are small producing decreased legibility and micrographia. 4) reaching to the back of the head to comb hair 5) patient and wife to come up with another area of focus  Response to tx:   Patient continues to require increased cues  and assist with exercises but is becoming more familiar with each exercise in the clinic.  We have switched to performing exercises in an adapted version for safety with balance.  Will consider reimplementing standard version in the clinic at some point, however given patient's dementia, therapist wants to provide consistency with form and feels it is best to keep the version the same at home and in the clinic for now.  Established functional component tasks based on hierarchy tasks which are difficult at home, patient and wife will need to think of one more task to add to the list this week.  Continue to work towards  goals to impact balance, ROM, strength, gait and to improve independence in daily tasks.                         OT Education - 08/19/20 1627    Education provided Yes    Education Details LSVT BIG exercises, balance, functional component tasks, reciprocal arm swing    Person(s) Educated Patient;Spouse    Methods Explanation;Demonstration;Tactile cues    Comprehension Verbalized understanding;Returned demonstration              OT Long Term Goals - 08/07/20 0754      OT LONG TERM GOAL #1   Title Patient will improve gait speed and endurance to be able to ambulate 900 feet in 6 minutes to negotiate around the home and community safely in 4 weeks.    Baseline 740 feet at eval    Time 4    Period Weeks    Status New    Target Date 09/11/20      OT LONG TERM GOAL #2   Title Patient will complete HEP for maximal daily exercises with min assist in 4 weeks.    Baseline no current program    Time 4    Period Weeks    Status New    Target Date 09/11/20      OT LONG TERM GOAL #3   Title Patient will transfer from to sit to stand without the use of arms safely and independently from a variety of chairs/surfaces in 4 weeks.    Baseline difficulty with low surfaces    Time 4    Period Weeks    Status New    Target Date 09/11/20      OT LONG TERM GOAL #4   Title Will assess BERG balance next session    Baseline unable to perform at eval due to time constraints, patient has functional balance deficits during eval    Time 1    Period Weeks    Status New    Target Date 08/14/20      OT LONG TERM GOAL #5   Title Patient will complete light meal prep with supervision.    Baseline unable at eval    Time 4    Period Weeks    Status New    Target Date 09/11/20      Long Term Additional Goals   Additional Long Term Goals Yes      OT LONG TERM GOAL #6   Title Patient will demonstrate improvements in handwriting with legiblity greater than 80% for printing and  signing name on important papers.    Baseline 50% legiblity at eval, mircrographia noted.    Time 4    Period Weeks    Status New    Target Date 09/11/20  Plan - 08/19/20 1628    Clinical Impression Statement Patient continues to require increased cues and assist with exercises but is becoming more familiar with each exercise in the clinic.  We have switched to performing exercises in an adapted version for safety with balance.  Will consider reimplementing standard version in the clinic at some point, however given patient's dementia, therapist wants to provide consistency with form and feels it is best to keep the version the same at home and in the clinic for now.  Established functional component tasks based on hierarchy tasks which are difficult at home, patient and wife will need to think of one more task to add to the list this week.  Continue to work towards goals to impact balance, ROM, strength, gait and to improve independence in daily tasks.    OT Occupational Profile and History Detailed Assessment- Review of Records and additional review of physical, cognitive, psychosocial history related to current functional performance    Occupational performance deficits (Please refer to evaluation for details): ADL's;IADL's;Play;Leisure;Social Participation    Body Structure / Function / Physical Skills ADL;Dexterity;Flexibility;ROM;Strength;Balance;Coordination;FMC;IADL;Body mechanics;Gait;Endurance;UE functional use;Decreased knowledge of use of DME;GMC;Mobility    Cognitive Skills Attention;Memory;Sequencing    Psychosocial Skills Environmental  Adaptations;Habits;Routines and Behaviors    Rehab Potential Good    Clinical Decision Making Limited treatment options, no task modification necessary    Comorbidities Affecting Occupational Performance: Presence of comorbidities impacting occupational performance    Comorbidities impacting occupational performance description:  dementia, chronic afib, h/o seizures, CVA, h/o back surgery/pain, HTN, anklyosing spondylitis    Modification or Assistance to Complete Evaluation  No modification of tasks or assist necessary to complete eval    OT Frequency 4x / week    OT Duration 4 weeks    OT Treatment/Interventions Self-care/ADL training;Cryotherapy;Therapeutic exercise;DME and/or AE instruction;Functional Mobility Training;Cognitive remediation/compensation;Balance training;Neuromuscular education;Gait Training;Manual Therapy;Moist Heat;Therapeutic activities;Patient/family education;Stair Training    Consulted and Agree with Plan of Care Patient;Family member/caregiver    Family Member Consulted Natalia Leatherwood, wife           Patient will benefit from skilled therapeutic intervention in order to improve the following deficits and impairments:   Body Structure / Function / Physical Skills: ADL, Dexterity, Flexibility, ROM, Strength, Balance, Coordination, FMC, IADL, Body mechanics, Gait, Endurance, UE functional use, Decreased knowledge of use of DME, GMC, Mobility Cognitive Skills: Attention, Memory, Sequencing Psychosocial Skills: Environmental  Adaptations, Habits, Routines and Behaviors   Visit Diagnosis: Unsteadiness on feet  Muscle weakness (generalized)  Other lack of coordination  Difficulty in walking, not elsewhere classified  Abnormal posture    Problem List Patient Active Problem List   Diagnosis Date Noted  . Raynaud phenomenon 03/22/2020  . Chronic venous insufficiency 03/22/2020  . B12 deficiency 03/10/2020  . 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 Pseudophakia of left eye 07/02/2018  . Obesity, Class I, BMI 30-34.9 02/15/2018  . Closed displaced fracture of distal phalanx of left middle finger 03/08/2017  . Closed fracture of distal end of left radius with routine healing 01/16/2017  . Closed fracture of nasal bone with routine healing 01/12/2017  .  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  . Closed fracture of dorsal (thoracic) vertebra (HCC) 12/19/2016  . Fall 12/19/2016  . Fracture of left radius 12/19/2016  . Primary osteoarthritis of right knee 10/14/2015  . Acute pain of right knee 09/09/2015  . Aftercare following joint replacement surgery 07/31/2015  . Closed nondisplaced fracture of lateral condyle of femur (HCC) 07/31/2015  .  Bilateral edema of lower extremity 06/26/2014  . Chronic atrial fibrillation (HCC) 07/13/2013  . Wound disruption, post-op, skin 10/11/2012  . Cellulitis and abscess 10/05/2012  . Hyperlipidemia 08/17/2012  . Osteoarthritis 08/17/2012  . Seizure disorder (HCC) 08/16/2012  . Hip pain 12/12/2011  . 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 CAD (coronary artery disease) 07/07/2011  . Diabetes mellitus, type 2 (HCC) 07/07/2011  . HTN (hypertension) 07/07/2011   Aeriel Boulay T Arne Cleveland, OTR/L, CLT  Elvera Almario 08/19/2020, 4:41 PM  [place] [facility] MAIN [facility] SERVICES [address] [place], [place], [address] Phone: [phone]   Fax:  [phone]  Name: Ian King MRN: [id number] Date of Birth: 17-May-1941

## 2020-08-19 NOTE — Therapy (Signed)
[facility] MAIN [facility] [address] [place], [place], [address] Phone: [phone]   Fax:  [phone]  Occupational Therapy Treatment  Patient Details  Name: Ian GlassLarry L Mogg MRN: [id number] Date of Birth: 11/30/1940 No data recorded  Encounter Date: 08/13/2020   OT End of Session - 08/19/20 1647    Visit Number 5    Number of Visits 17    Date for OT Re-Evaluation 09/11/20    OT Start Time 1300    OT Stop Time 1359    OT Time Calculation (min) 59 min    Activity Tolerance Patient tolerated treatment well    Behavior [facility] Therapy [facility] for tasks assessed/performed           Past Medical History:  Diagnosis Date  . A-fib (HCC)   . Diabetes mellitus without complication (HCC)   . Hypertension     Past Surgical History:  Procedure Laterality Date  . arm surgery    . BACK SURGERY    . JOINT REPLACEMENT     hip  . JOINT REPLACEMENT Right    knee  . SPINE SURGERY      There were no vitals filed for this visit.   Subjective Assessment - 08/19/20 1644    Subjective  Wife present during exercises today and will work with patient at home over the weekend.  Issued and instructed on written/pictorial LSVT BIG maximal daily exercises in Adapted version.    Patient is accompanied by: Family member    Patient Stated Goals Patient reports he would like to be as independent as he can for himself.  "I want to do as much as I can."    Currently in Pain? No/denies    Pain Score 0-No pain            Neuromuscular Reeducation to facilitate proper movement patterns following principles of LSVT BIG:  Performance of LSVT Daily Session Maximal Daily Exercises: Sustained movements are designed to rescale the amplitude of movement output for generalization to daily functional activities. Performed as follows for 1 set of 10 repetitions each: Multi directional sustained movements- 1) Floor to ceiling, 2) Side to side. Multi directional  Repetitive movements performed in standing and are designed to provide retraining effort needed for sustained muscle activation in tasks Performed as follows: 3) Step and reach forward, 4) Step and Reach Backwards, 5) Step and reach sideways, 6) Rock and reach forward/backward, 7) Rock and reach sideways. Patient instructed on exercises in a standard version with therapist providing moderate assist for balance and proper form and technique. Ptcontinues tobenefit from slow instruction, repetition along with verbal and tactile cues.Adapted version of exercises performed this date in the clinic, wife present and part of treatment session working towards instruction for home program and to give input into functional component tasks.     Sit to stand 10 times from mat table with cues for weight shift forwards, mat at lowest position this date. Supervision provided.   Functional mobility to and from the clinic, 150 feet each direction, cane utilized and verbal/tactile cues for arm swing on left.   Functional component tasks:  Establishment of full list with patient and wife to work on going forwards. 1) sit to stand from lower surfaces supervision this date 2) reaching back with left hand to wipe after toileting 3) Handwriting pen strokes are small producing decreased legibility and micrographia. 4) reaching to the back of the head to comb hair 5) Reaching into  left pocket of pants or coat  Response to tx:   Patient responding well to repetition of exercises, able to recall first exercise without prompts this date, cues for all other exercises however patient reports they are all familiar and he recalls portions of exercises from last session.  Focused this date on detailed instruction to patient and wife with use of written/pictorial handouts for home program with exercises in adapted version.  Patient responds well to therapist demonstration and cues, he does have difficulty with right/left  discrimination.  Will need to work additionally on amplitude of stepping patterns as well as arm swing next week.  Patient and wife aware they will need to perform exercises 2 times a day over the weekend, 10 reps each.                     OT Education - 08/19/20 1647    Education provided Yes    Education Details LSVT BIG exercises, balance, functional component tasks, reciprocal arm swing    Person(s) Educated Patient;Spouse    Methods Explanation;Demonstration;Tactile cues    Comprehension Verbalized understanding;Returned demonstration;Need further instruction             OT Long Term Goals - 08/07/20 0754      OT LONG TERM GOAL #1   Title Patient will improve gait speed and endurance to be able to ambulate 900 feet in 6 minutes to negotiate around the home and community safely in 4 weeks.    Baseline 740 feet at eval    Time 4    Period Weeks    Status New    Target Date 09/11/20      OT LONG TERM GOAL #2   Title Patient will complete HEP for maximal daily exercises with min assist in 4 weeks.    Baseline no current program    Time 4    Period Weeks    Status New    Target Date 09/11/20      OT LONG TERM GOAL #3   Title Patient will transfer from to sit to stand without the use of arms safely and independently from a variety of chairs/surfaces in 4 weeks.    Baseline difficulty with low surfaces    Time 4    Period Weeks    Status New    Target Date 09/11/20      OT LONG TERM GOAL #4   Title Will assess BERG balance next session    Baseline unable to perform at eval due to time constraints, patient has functional balance deficits during eval    Time 1    Period Weeks    Status New    Target Date 08/14/20      OT LONG TERM GOAL #5   Title Patient will complete light meal prep with supervision.    Baseline unable at eval    Time 4    Period Weeks    Status New    Target Date 09/11/20      Long Term Additional Goals   Additional Long Term  Goals Yes      OT LONG TERM GOAL #6   Title Patient will demonstrate improvements in handwriting with legiblity greater than 80% for printing and signing name on important papers.    Baseline 50% legiblity at eval, mircrographia noted.    Time 4    Period Weeks    Status New    Target Date 09/11/20  Plan - 08/19/20 1648    Clinical Impression Statement Patient responding well to repetition of exercises, able to recall first exercise without prompts this date, cues for all other exercises however patient reports they are all familiar and he recalls portions of exercises from last session.  Focused this date on detailed instruction to patient and wife with use of written/pictorial handouts for home program with exercises in adapted version.  Patient responds well to therapist demonstration and cues, he does have difficulty with right/left discrimination.  Will need to work additionally on amplitude of stepping patterns as well as arm swing next week.  Patient and wife aware they will need to perform exercises 2 times a day over the weekend, 10 reps each.    OT Occupational Profile and History Detailed Assessment- Review of Records and additional review of physical, cognitive, psychosocial history related to current functional performance    Occupational performance deficits (Please refer to evaluation for details): ADL's;IADL's;Play;Leisure;Social Participation    Body Structure / Function / Physical Skills ADL;Dexterity;Flexibility;ROM;Strength;Balance;Coordination;FMC;IADL;Body mechanics;Gait;Endurance;UE functional use;Decreased knowledge of use of DME;GMC;Mobility    Cognitive Skills Attention;Memory;Sequencing    Psychosocial Skills Environmental  Adaptations;Habits;Routines and Behaviors    Rehab Potential Good    Clinical Decision Making Limited treatment options, no task modification necessary    Comorbidities Affecting Occupational Performance: Presence of  comorbidities impacting occupational performance    Comorbidities impacting occupational performance description: dementia, chronic afib, h/o seizures, CVA, h/o back surgery/pain, HTN, anklyosing spondylitis    Modification or Assistance to Complete Evaluation  No modification of tasks or assist necessary to complete eval    OT Frequency 4x / week    OT Duration 4 weeks    OT Treatment/Interventions Self-care/ADL training;Cryotherapy;Therapeutic exercise;DME and/or AE instruction;Functional Mobility Training;Cognitive remediation/compensation;Balance training;Neuromuscular education;Gait Training;Manual Therapy;Moist Heat;Therapeutic activities;Patient/family education;Stair Training    Consulted and Agree with Plan of Care Patient;Family member/caregiver    Family Member Consulted Natalia Leatherwood, wife           Patient will benefit from skilled therapeutic intervention in order to improve the following deficits and impairments:   Body Structure / Function / Physical Skills: ADL, Dexterity, Flexibility, ROM, Strength, Balance, Coordination, FMC, IADL, Body mechanics, Gait, Endurance, UE functional use, Decreased knowledge of use of DME, GMC, Mobility Cognitive Skills: Attention, Memory, Sequencing Psychosocial Skills: Environmental  Adaptations, Habits, Routines and Behaviors   Visit Diagnosis: Unsteadiness on feet  Muscle weakness (generalized)  Other lack of coordination  Difficulty in walking, not elsewhere classified  Abnormal posture    Problem List Patient Active Problem List   Diagnosis Date Noted  . Raynaud phenomenon 03/22/2020  . Chronic venous insufficiency 03/22/2020  . B12 deficiency 03/10/2020  . 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 Pseudophakia of left eye 07/02/2018  . Obesity, Class I, BMI 30-34.9 02/15/2018  . Closed displaced fracture of distal phalanx of left middle finger 03/08/2017  . Closed fracture of distal end of left  radius with routine healing 01/16/2017  . Closed fracture of nasal bone with routine healing 01/12/2017  . 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  . Closed fracture of dorsal (thoracic) vertebra (HCC) 12/19/2016  . Fall 12/19/2016  . Fracture of left radius 12/19/2016  . Primary osteoarthritis of right knee 10/14/2015  . Acute pain of right knee 09/09/2015  . Aftercare following joint replacement surgery 07/31/2015  . Closed nondisplaced fracture of lateral condyle of femur (HCC) 07/31/2015  . Bilateral edema of lower extremity 06/26/2014  . Chronic atrial fibrillation (  HCC) 07/13/2013  . Wound disruption, post-op, skin 10/11/2012  . Cellulitis and abscess 10/05/2012  . Hyperlipidemia 08/17/2012  . Osteoarthritis 08/17/2012  . Seizure disorder (HCC) 08/16/2012  . Hip pain 12/12/2011  . 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 CAD (coronary artery disease) 07/07/2011  . Diabetes mellitus, type 2 (HCC) 07/07/2011  . HTN (hypertension) 07/07/2011   Piers Baade T Arne Cleveland, OTR/L, CLT  Amesha Bailey 08/19/2020, 5:06 PM  [facility] MAIN [facility] SERVICES [address] [place], [place], [address] Phone: [phone]   Fax:  [phone]  Name: ARATH KAIGLER MRN: [id number] Date of Birth: 09-29-1941

## 2020-08-20 ENCOUNTER — Ambulatory Visit: Payer: Medicare PPO | Admitting: Occupational Therapy

## 2020-08-20 ENCOUNTER — Encounter: Payer: Self-pay | Admitting: Speech Pathology

## 2020-08-20 ENCOUNTER — Other Ambulatory Visit: Payer: Self-pay

## 2020-08-20 ENCOUNTER — Ambulatory Visit: Payer: Medicare PPO | Admitting: Speech Pathology

## 2020-08-20 DIAGNOSIS — R262 Difficulty in walking, not elsewhere classified: Secondary | ICD-10-CM

## 2020-08-20 DIAGNOSIS — M6281 Muscle weakness (generalized): Secondary | ICD-10-CM

## 2020-08-20 DIAGNOSIS — R278 Other lack of coordination: Secondary | ICD-10-CM

## 2020-08-20 DIAGNOSIS — R293 Abnormal posture: Secondary | ICD-10-CM

## 2020-08-20 DIAGNOSIS — R2681 Unsteadiness on feet: Secondary | ICD-10-CM

## 2020-08-20 NOTE — Therapy (Signed)
[facility] MAIN [facility] SERVICES [address] [place], [place], [address] Phone: [phone]   Fax:  [phone]  Speech Language Pathology Treatment  Patient Details  Name: Ian King MRN: [id number] Date of Birth: 1941/06/05 Referring Provider (SLP): Dr. Sherryll Burger   Encounter Date: 08/19/2020   End of Session - 08/20/20 1534    Visit Number 5    Number of Visits 17    Date for SLP Re-Evaluation 10/02/20    Authorization Type Medicare    Authorization Time Period Start 08/05/2020    Authorization - Visit Number 5    Progress Note Due on Visit 10    SLP Start Time 1400    SLP Stop Time  1500    SLP Time Calculation (min) 60 min    Activity Tolerance Patient tolerated treatment well           Past Medical History:  Diagnosis Date  . A-fib (HCC)   . Diabetes mellitus without complication (HCC)   . Hypertension     Past Surgical History:  Procedure Laterality Date  . arm surgery    . BACK SURGERY    . JOINT REPLACEMENT     hip  . JOINT REPLACEMENT Right    knee  . SPINE SURGERY      There were no vitals filed for this visit.   Subjective Assessment - 08/20/20 1533    Subjective pt pleasant, tangential    Patient is accompained by: Family member    Currently in Pain? No/denies                 ADULT SLP TREATMENT - 08/20/20 0001      General Information   Behavior/Cognition Alert;Cooperative;Pleasant mood;Confused;Requires cueing;Decreased sustained attention;Distractible;Doesn't follow directions    HPI Ian King is a 79 y.o. male with PMHx of Afib on Xarelto, HLD, former tobacco use, T2DM on metformin, CAD who presented to [facility] 03/08/2020 for evaluation of possible stroke. MRI revealed acute to subacute right ventricle stripe territory infarction, small bilateral acute foci of restricted diffusion at the convexities consistent with acute cortical infarctions and more chronic ischemic changes bilaterally in the  posterior cerebral artery territories and less prominently in the middle cerebral artery territories. MOCA 03/09/2020: 27/30. 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        Treatment Provided   Treatment provided Cognitive-Linquistic      Pain Assessment   Pain Assessment No/denies pain      Cognitive-Linquistic Treatment   Treatment focused on Cognition;Patient/family/caregiver education;Voice    Skilled Treatment Education provided on pt's current neurological diagnoses, pt with difficulty sorting cards into 2 decks and sorting coins into quarters, dimes, nickels, pennies       Assessment / Recommendations / Plan   Plan Continue with current plan of care      Progression Toward Goals   Progression toward goals Progressing toward goals            SLP Education - 08/20/20 1533    Education Details basic sorting tasks at home, information regarding neurological diagnoses    Person(s) Educated Patient;Spouse    Methods  Explanation;Demonstration;Verbal cues;Handout    Comprehension Need further instruction              SLP Long Term Goals - 08/06/20 1307      SLP LONG TERM GOAL #1   Title Patient and his wife will participate in developing functional compensatory strategies to improve engagement and activity.    Time 8    Period Weeks    Status New    Target Date 10/02/20      SLP LONG TERM GOAL #2   Title Patient will demonstrate functional cognitive-communication skills for independent completion of personal responsibilities and leisure  activities.    Time 8    Period Weeks    Status New    Target Date 10/02/20      SLP LONG TERM GOAL #3   Title Patient will generate grammatical and cogent sentence to complete simple/concrete linguistic task with 80% accuracy..    Time 8    Period Weeks    Status New    Target Date 10/02/20      SLP LONG TERM GOAL #4   Title Patient will complete semantic feature word finding tasks with 80% accuracy.    Time 8    Period Weeks    Status New    Target Date 10/02/20            Plan - 08/20/20 1535    Clinical Impression Statement Skilled treatment session focused on pt's cognitive communication training. SLP facilitated session by providing education and answering wife's questions about pt's neurological diagnoses. Written information was provided. SLP further facilitated session by providing 2 decks of cards (1 red and 1 blue) with instructions to sort the cards by color. Pt unable to perform task. Even with extensive assistance, he kept randomly saying "black 3" and "that's what you say" when redirected to look at the solid color - red or blue. Task switched to sorting coins. Again, pt able to recognize each coin but even with extensive assistance pt was not able to sort by coin type. Within the session, pt is not demonstrating any new learning. Will continue with basic functional tasks to assess if repetition helps with tasks.    Speech Therapy Frequency 2x / week    Duration 12 weeks    Treatment/Interventions Cognitive reorganization;Compensatory techniques;SLP instruction and feedback;Patient/family education    Potential to Achieve Goals Good    Potential Considerations Ability to learn/carryover information;Previous level of function;Co-morbidities;Severity of impairments;Cooperation/participation level;Medical prognosis;Family/community support    SLP Home Exercise Plan basic sorting tasks    Consulted and Agree with Plan of Care Patient;Family member/caregiver    Family  Member Consulted Spouse           Patient will benefit from skilled therapeutic intervention in order to improve the following deficits and impairments:   Cognitive communication deficit    Problem List Patient Active Problem List   Diagnosis Date Noted  . Raynaud phenomenon 03/22/2020  . Chronic venous insufficiency 03/22/2020  . B12 deficiency 03/10/2020  . 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 Pseudophakia of left eye 07/02/2018  . Obesity, Class I, BMI 30-34.9 02/15/2018  . Closed displaced fracture of distal phalanx of left middle finger 03/08/2017  . Closed fracture of distal end of left radius with routine healing 01/16/2017  . Closed fracture of nasal bone with routine healing 01/12/2017  . 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  . Closed fracture of dorsal (thoracic) vertebra (HCC) 12/19/2016  . Fall 12/19/2016  .  Fracture of left radius 12/19/2016  . Primary osteoarthritis of right knee 10/14/2015  . Acute pain of right knee 09/09/2015  . Aftercare following joint replacement surgery 07/31/2015  . Closed nondisplaced fracture of lateral condyle of femur (HCC) 07/31/2015  . Bilateral edema of lower extremity 06/26/2014  . Chronic atrial fibrillation (HCC) 07/13/2013  . Wound disruption, post-op, skin 10/11/2012  . Cellulitis and abscess 10/05/2012  . Hyperlipidemia 08/17/2012  . Osteoarthritis 08/17/2012  . Seizure disorder (HCC) 08/16/2012  . Hip pain 12/12/2011  . 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 CAD (coronary artery disease) 07/07/2011  . Diabetes mellitus, type 2 (HCC) 07/07/2011  . HTN (hypertension) 07/07/2011   Tache Bobst B. Dreama Saa M.S., CCC-SLP, [facility] Speech-Language Pathologist Rehabilitation Services Office [phone]  Reuel Derby 08/20/2020, 3:36 PM  [facility] MAIN [facility] SERVICES [address] [place], [place], [address] Phone: [phone]   Fax:   [phone]   Name: JABREEL CHIMENTO MRN: [id number] Date of Birth: [birth year]

## 2020-08-22 ENCOUNTER — Encounter: Payer: Self-pay | Admitting: Occupational Therapy

## 2020-08-22 NOTE — Therapy (Signed)
[place] MAIN [facility] SERVICES [address] [place], [place], [address] Phone: [phone]   Fax:  [phone]  Occupational Therapy Treatment  Patient Details  Name: Ian King MRN: [id number] Date of Birth: 1941-09-10 No data recorded  Encounter Date: 08/18/2020   OT End of Session - 08/22/20 1220    Visit Number 7    Number of Visits 17    Date for OT Re-Evaluation 09/11/20    OT Start Time 1300    OT Stop Time 1358    OT Time Calculation (min) 58 min    Activity Tolerance Patient tolerated treatment well    Behavior During Therapy [facility] for tasks assessed/performed           Past Medical History:  Diagnosis Date  . A-fib ([place])   . Diabetes mellitus without complication ([place])   . Hypertension     Past Surgical History:  Procedure Laterality Date  . arm surgery    . BACK SURGERY    . JOINT REPLACEMENT     hip  . JOINT REPLACEMENT Right    knee  . SPINE SURGERY      There were no vitals filed for this visit.   Subjective Assessment - 08/22/20 1218    Subjective  Patient talking about speech therapy from yesterday and having some confusion over a task the speech therapist asked him to do with a puzzle.    Patient is accompanied by: Family member    Patient Stated Goals Patient reports he would like to be as independent as he can for himself.  "I want to do as much as I can."    Currently in Pain? No/denies    Pain Score 0-No pain             Neuromuscular Reeducation to facilitate proper movement patterns following principles of LSVT BIG:  Performance ofLSVT Daily Session Maximal Daily Exercises: Sustained movements are designed to rescale the amplitude of movement output for generalization to daily functional activities. Performed as follows for 1 set of 10 repetitions each: Multi directional sustained movements- 1) Floor to ceiling, 2) Side to side. Multi directional Repetitive movements performed in standing and  are designed to provide retraining effort needed for sustained muscle activation in tasks Performed as follows: 3) Step and reach forward, 4) Step and Reach Backwards, 5) Step and reach sideways, 6) Rock and reach forward/backward, 7) Rock and reach sideways. Patient instructed on exercises in and adapted version with therapist providing min assist for balance and proper form and technique.   Ptcontinues tobenefit from slow instruction, repetition along with verbal and tactile cues. Focused this date on increasing arm movements and hands, cues for opening BIG during exercises.  Sit to stand 10 times from mat table with cues for arm positioning, mat at lowest position this date and with supervision provided.  Functional mobility cane utilized and verbal/tactile cues for arm swing on left, 2 trials of 275 feet ea  Functional component tasks, all performed 5 reps each and with therapist demo and cues. 1) sit to stand from lower surfacessupervision this date 2) reaching back with left hand to wipe after toileting 3) Handwriting pen strokes are small producing decreased legibility and micrographia. 4) reaching to the back of the head to comb hair 5)Reaching into left pocket of pants or coat  Response to tx: Patient has progressed to min assist for exercises, focus is primarily on sequencing of exercise, form and amplitude of motion.  He is becoming more familiar with exercises and able to recall exercises with cues.  He is aware of the names of exercises with occasional assist to recall.  Continued shaping of exercises with therapist guiding the first couple reps and then working to extinguish the need for shaping for the remainder of the reps.  Good progress in all areas, patient reports he feels he is improving since starting therapy.  Continue OT to maximize safety and independence in daily tasks.                     OT Education - 08/22/20 1219    Education provided  Yes    Education Details LSVT BIG exercises, balance, functional component tasks, reciprocal arm swing, hand flicks    Person(s) Educated Patient;Spouse    Methods Explanation;Demonstration;Tactile cues    Comprehension Verbalized understanding;Returned demonstration;Need further instruction            OT Short Term Goals - 04/07/17 1555      OT SHORT TERM GOAL #1   Title Pt to be ind in HEP to increase AROM in digits and wrist - splint wearing for [place]/wrist     Status Achieved      OT SHORT TERM GOAL #2   Title AROM in L 3rd digit improve for pt to touch palm without extention lag more than -10 degrees      Baseline extention lag -15 degrees     Status Partially Met      OT SHORT TERM GOAL #3   Title Pain on PRWHE improve with 15 points     Baseline at eval pain on PRWHE 26/50- pain now less than 5/50     Status Achieved      OT SHORT TERM GOAL #4   Title L wrist AROM improve for pt turn page, take change in palm, eat 50% with L hand     Status Achieved             OT Long Term Goals - 08/07/20 0754      OT LONG TERM GOAL #1   Title Patient will improve gait speed and endurance to be able to ambulate 900 feet in 6 minutes to negotiate around the home and community safely in 4 weeks.    Baseline 740 feet at eval    Time 4    Period Weeks    Status New    Target Date 09/11/20      OT LONG TERM GOAL #2   Title Patient will complete HEP for maximal daily exercises with min assist in 4 weeks.    Baseline no current program    Time 4    Period Weeks    Status New    Target Date 09/11/20      OT LONG TERM GOAL #3   Title Patient will transfer from to sit to stand without the use of arms safely and independently from a variety of chairs/surfaces in 4 weeks.    Baseline difficulty with low surfaces    Time 4    Period Weeks    Status New    Target Date 09/11/20      OT LONG TERM GOAL #4   Title Will assess BERG balance next session    Baseline unable to  perform at eval due to time constraints, patient has functional balance deficits during eval    Time 1    Period Weeks    Status New  Target Date 08/14/20      OT LONG TERM GOAL #5   Title Patient will complete light meal prep with supervision.    Baseline unable at eval    Time 4    Period Weeks    Status New    Target Date 09/11/20      Long Term Additional Goals   Additional Long Term Goals Yes      OT LONG TERM GOAL #6   Title Patient will demonstrate improvements in handwriting with legiblity greater than 80% for printing and signing name on important papers.    Baseline 50% legiblity at eval, mircrographia noted.    Time 4    Period Weeks    Status New    Target Date 09/11/20                 Plan - 08/22/20 1220    Clinical Impression Statement Patient has progressed to min assist for exercises, focus is primarily on sequencing of exercise, form and amplitude of motion.  He is becoming more familiar with exercises and able to recall exercises with cues.  He is aware of the names of exercises with occasional assist to recall.  Continued shaping of exercises with therapist guiding the first couple reps and then working to extinguish the need for shaping for the remainder of the reps.  Good progress in all areas, patient reports he feels he is improving since starting therapy.  Continue OT to maximize safety and independence in daily tasks.    OT Occupational Profile and History Detailed Assessment- Review of Records and additional review of physical, cognitive, psychosocial history related to current functional performance    Occupational performance deficits (Please refer to evaluation for details): ADL's;IADL's;Play;Leisure;Social Participation    Body Structure / Function / Physical Skills ADL;Dexterity;Flexibility;ROM;Strength;Balance;Coordination;FMC;IADL;Body mechanics;Gait;Endurance;UE functional use;Decreased knowledge of use of DME;GMC;Mobility    Cognitive  Skills Attention;Memory;Sequencing    Psychosocial Skills Environmental  Adaptations;Habits;Routines and Behaviors    Rehab Potential Good    Clinical Decision Making Limited treatment options, no task modification necessary    Comorbidities Affecting Occupational Performance: Presence of comorbidities impacting occupational performance    Comorbidities impacting occupational performance description: dementia, chronic afib, h/o seizures, CVA, h/o back surgery/pain, HTN, anklyosing spondylitis    Modification or Assistance to Complete Evaluation  No modification of tasks or assist necessary to complete eval    OT Frequency 4x / week    OT Duration 4 weeks    OT Treatment/Interventions Self-care/ADL training;Cryotherapy;Therapeutic exercise;DME and/or AE instruction;Functional Mobility Training;Cognitive remediation/compensation;Balance training;Neuromuscular education;Gait Training;Manual Therapy;Moist Heat;Therapeutic activities;Patient/family education;Stair Training    Consulted and Agree with Plan of Care Patient;Family member/caregiver    Family Member Consulted Belenda Cruise, wife           Patient will benefit from skilled therapeutic intervention in order to improve the following deficits and impairments:   Body Structure / Function / Physical Skills: ADL, Dexterity, Flexibility, ROM, Strength, Balance, Coordination, FMC, IADL, Body mechanics, Gait, Endurance, UE functional use, Decreased knowledge of use of DME, GMC, Mobility Cognitive Skills: Attention, Memory, Sequencing Psychosocial Skills: Environmental  Adaptations, Habits, Routines and Behaviors   Visit Diagnosis: Unsteadiness on feet  Muscle weakness (generalized)  Other lack of coordination  Difficulty in walking, not elsewhere classified  Abnormal posture    Problem List Patient Active Problem List   Diagnosis Date Noted  . Raynaud phenomenon 03/22/2020  . Chronic venous insufficiency 03/22/2020  . B12 deficiency  03/10/2020  . Acute ischemic multifocal  anterior circulation stroke ([place]) 03/09/2020  . Pseudophakia of left eye 07/02/2018  . Obesity, Class I, BMI 30-34.9 02/15/2018  . Closed displaced fracture of distal phalanx of left middle finger 03/08/2017  . Closed fracture of distal end of left radius with routine healing 01/16/2017  . Closed fracture of nasal bone with routine healing 01/12/2017  . Ankylosing spondylitis ([place]) 12/19/2016  . Closed fracture of dorsal (thoracic) vertebra ([place]) 12/19/2016  . Fall 12/19/2016  . Fracture of left radius 12/19/2016  . Primary osteoarthritis of right knee 10/14/2015  . Acute pain of right knee 09/09/2015  . Aftercare following joint replacement surgery 07/31/2015  . Closed nondisplaced fracture of lateral condyle of femur ([place]) 07/31/2015  . Bilateral edema of lower extremity 06/26/2014  . Chronic atrial fibrillation ([place]) 07/13/2013  . Wound disruption, post-op, skin 10/11/2012  . Cellulitis and abscess 10/05/2012  . Hyperlipidemia 08/17/2012  . Osteoarthritis 08/17/2012  . Seizure disorder ([place]) 08/16/2012  . Hip pain 12/12/2011  . 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 CAD (coronary artery disease) 07/07/2011  . Diabetes mellitus, type 2 ([place]) 07/07/2011  . HTN (hypertension) 07/07/2011   Josef Tourigny T Tomasita Morrow, OTR/L, CLT  Leaann Nevils 08/22/2020, 12:30 PM  [place] MAIN [facility] SERVICES [address] [place], [place], [address] Phone: [phone]   Fax:  [phone]  Name: Ian King MRN: [id number] Date of Birth: 1940/11/26

## 2020-08-22 NOTE — Therapy (Signed)
[place] MAIN [facility] SERVICES [address] [place], [place], [address] Phone: [phone]   Fax:  [phone]  Occupational Therapy Treatment  Patient Details  Name: Ian King MRN: [id number] Date of Birth: August 26, 1941 No data recorded  Encounter Date: 08/19/2020   OT End of Session - 08/22/20 1315    Visit Number 8    Number of Visits 17    Date for OT Re-Evaluation 09/11/20    OT Start Time 1259    OT Stop Time 1400    OT Time Calculation (min) 61 min    Activity Tolerance Patient tolerated treatment well    Behavior During Therapy [facility] for tasks assessed/performed           Past Medical History:  Diagnosis Date  . A-fib ([place])   . Diabetes mellitus without complication ([place])   . Hypertension     Past Surgical History:  Procedure Laterality Date  . arm surgery    . BACK SURGERY    . JOINT REPLACEMENT     hip  . JOINT REPLACEMENT Right    knee  . SPINE SURGERY      There were no vitals filed for this visit.   Subjective Assessment - 08/22/20 1314    Subjective  Patient with no complaints today, reports reaching into his left pocket remains difficult at times.    Patient Stated Goals Patient reports he would like to be as independent as he can for himself.  "I want to do as much as I can."    Currently in Pain? No/denies    Pain Score 0-No pain           Neuromuscular Reeducation to facilitate proper movement patterns following principles of LSVT BIG:  Performance ofLSVT Daily Session Maximal Daily Exercises: Sustained movements are designed to rescale the amplitude of movement output for generalization to daily functional activities. Performed as follows for 1 set of 10 repetitions each: Multi directional sustained movements- 1) Floor to ceiling, 2) Side to side. Multi directional Repetitive movements performed in standing and are designed to provide retraining effort needed for sustained muscle activation in tasks  Performed as follows: 3) Step and reach forward, 4) Step and Reach Backwards, 5) Step and reach sideways, 6) Rock and reach forward/backward, 7) Rock and reach sideways. Patient instructed on exercises inand adaptedversion with therapist providing minassist for balance and proper form and technique.   Ptcontinues tobenefit from slow instruction, repetition along with verbal and tactile cues. Continued focus on increasing arm movements and hands, cues for opening BIG during exercises.  Sit to stand 10 times from mat table with cues for arm positioning, mat at lowest position this dateand with supervision provided.  Functional mobility cane utilized and verbal/tactile cues for arm swing on left, mobility today to and from the clinic for 150 feet each direction.    Functional component tasks, all performed 5 reps each and with therapist demo and cues. 1) sit to stand from lower surfacessupervision this date 2) reaching back with left hand to wipe after toileting 3) Handwriting pen strokes are small producing decreased legibility and micrographia. 4) reaching to the back of the head to comb hair 5)Reaching into left pocket of pants or coat  Added hand flicks prior to select functional component tasks and added button manipulation as well to the list.    Response to tx: Patient has progress this week from requiring moderate assist with exercises to min assist.  Added  hand flicks prior to managing buttons and prior to reaching into left pocket to retrieve items.  Pt continues to work towards sequencing of exercises, decreasing cues and attempting to decrease mirroring from therapist of exercises.  He has responded well to grading and shaping of exercises with assist to start/initiate the exercise while then extinguishing the cues for the remainder of the exercises.  Continue to work towards goals in plan of care to improve balance, functional mobility, ROM, strength and fine motor  coordination to improve overall independence in daily tasks.  Wife and patient pleased with his progress so far and is noting changes in his daily performance.                     OT Education - 08/22/20 1315    Education provided Yes    Education Details LSVT BIG exercises, balance, functional component tasks, reciprocal arm swing, hand flicks    Person(s) Educated Patient;Spouse    Methods Explanation;Demonstration;Tactile cues    Comprehension Verbalized understanding;Returned demonstration;Need further instruction            OT Short Term Goals - 04/07/17 1555      OT SHORT TERM GOAL #1   Title Pt to be ind in HEP to increase AROM in digits and wrist - splint wearing for Mallet/wrist     Status Achieved      OT SHORT TERM GOAL #2   Title AROM in L 3rd digit improve for pt to touch palm without extention lag more than -10 degrees      Baseline extention lag -15 degrees     Status Partially Met      OT SHORT TERM GOAL #3   Title Pain on PRWHE improve with 15 points     Baseline at eval pain on PRWHE 26/50- pain now less than 5/50     Status Achieved      OT SHORT TERM GOAL #4   Title L wrist AROM improve for pt turn page, take change in palm, eat 50% with L hand     Status Achieved             OT Long Term Goals - 08/07/20 0754      OT LONG TERM GOAL #1   Title Patient will improve gait speed and endurance to be able to ambulate 900 feet in 6 minutes to negotiate around the home and community safely in 4 weeks.    Baseline 740 feet at eval    Time 4    Period Weeks    Status New    Target Date 09/11/20      OT LONG TERM GOAL #2   Title Patient will complete HEP for maximal daily exercises with min assist in 4 weeks.    Baseline no current program    Time 4    Period Weeks    Status New    Target Date 09/11/20      OT LONG TERM GOAL #3   Title Patient will transfer from to sit to stand without the use of arms safely and independently from a  variety of chairs/surfaces in 4 weeks.    Baseline difficulty with low surfaces    Time 4    Period Weeks    Status New    Target Date 09/11/20      OT LONG TERM GOAL #4   Title Will assess BERG balance next session    Baseline unable to perform at eval  due to time constraints, patient has functional balance deficits during eval    Time 1    Period Weeks    Status New    Target Date 08/14/20      OT LONG TERM GOAL #5   Title Patient will complete light meal prep with supervision.    Baseline unable at eval    Time 4    Period Weeks    Status New    Target Date 09/11/20      Long Term Additional Goals   Additional Long Term Goals Yes      OT LONG TERM GOAL #6   Title Patient will demonstrate improvements in handwriting with legiblity greater than 80% for printing and signing name on important papers.    Baseline 50% legiblity at eval, mircrographia noted.    Time 4    Period Weeks    Status New    Target Date 09/11/20                 Plan - 08/22/20 1316    Clinical Impression Statement Patient has progress this week from requiring moderate assist with exercises to min assist.  Added hand flicks prior to managing buttons and prior to reaching into left pocket to retrieve items.  Pt continues to work towards sequencing of exercises, decreasing cues and attempting to decrease mirroring from therapist of exercises.  He has responded well to grading and shaping of exercises with assist to start/initiate the exercise while then extinguishing the cues for the remainder of the exercises.  Continue to work towards goals in plan of care to improve balance, functional mobility, ROM, strength and fine motor coordination to improve overall independence in daily tasks.  Wife and patient pleased with his progress so far and is noting changes in his daily performance.    OT Occupational Profile and History Detailed Assessment- Review of Records and additional review of physical,  cognitive, psychosocial history related to current functional performance    Occupational performance deficits (Please refer to evaluation for details): ADL's;IADL's;Play;Leisure;Social Participation    Body Structure / Function / Physical Skills ADL;Dexterity;Flexibility;ROM;Strength;Balance;Coordination;FMC;IADL;Body mechanics;Gait;Endurance;UE functional use;Decreased knowledge of use of DME;GMC;Mobility    Cognitive Skills Attention;Memory;Sequencing    Psychosocial Skills Environmental  Adaptations;Habits;Routines and Behaviors    Rehab Potential Good    Clinical Decision Making Limited treatment options, no task modification necessary    Comorbidities Affecting Occupational Performance: Presence of comorbidities impacting occupational performance    Comorbidities impacting occupational performance description: dementia, chronic afib, h/o seizures, CVA, h/o back surgery/pain, HTN, anklyosing spondylitis    Modification or Assistance to Complete Evaluation  No modification of tasks or assist necessary to complete eval    OT Frequency 4x / week    OT Duration 4 weeks    OT Treatment/Interventions Self-care/ADL training;Cryotherapy;Therapeutic exercise;DME and/or AE instruction;Functional Mobility Training;Cognitive remediation/compensation;Balance training;Neuromuscular education;Gait Training;Manual Therapy;Moist Heat;Therapeutic activities;Patient/family education;Stair Training    Consulted and Agree with Plan of Care Patient;Family member/caregiver    Family Member Consulted Belenda Cruise, wife           Patient will benefit from skilled therapeutic intervention in order to improve the following deficits and impairments:   Body Structure / Function / Physical Skills: ADL, Dexterity, Flexibility, ROM, Strength, Balance, Coordination, FMC, IADL, Body mechanics, Gait, Endurance, UE functional use, Decreased knowledge of use of DME, GMC, Mobility Cognitive Skills: Attention, Memory,  Sequencing Psychosocial Skills: Environmental  Adaptations, Habits, Routines and Behaviors   Visit Diagnosis: Unsteadiness on feet  Muscle  weakness (generalized)  Other lack of coordination  Difficulty in walking, not elsewhere classified  Abnormal posture    Problem List Patient Active Problem List   Diagnosis Date Noted  . Raynaud phenomenon 03/22/2020  . Chronic venous insufficiency 03/22/2020  . B12 deficiency 03/10/2020  . Acute ischemic multifocal anterior circulation stroke ([place]) 03/09/2020  . Pseudophakia of left eye 07/02/2018  . Obesity, Class I, BMI 30-34.9 02/15/2018  . Closed displaced fracture of distal phalanx of left middle finger 03/08/2017  . Closed fracture of distal end of left radius with routine healing 01/16/2017  . Closed fracture of nasal bone with routine healing 01/12/2017  . Ankylosing spondylitis ([place]) 12/19/2016  . Closed fracture of dorsal (thoracic) vertebra ([place]) 12/19/2016  . Fall 12/19/2016  . Fracture of left radius 12/19/2016  . Primary osteoarthritis of right knee 10/14/2015  . Acute pain of right knee 09/09/2015  . Aftercare following joint replacement surgery 07/31/2015  . Closed nondisplaced fracture of lateral condyle of femur ([place]) 07/31/2015  . Bilateral edema of lower extremity 06/26/2014  . Chronic atrial fibrillation ([place]) 07/13/2013  . Wound disruption, post-op, skin 10/11/2012  . Cellulitis and abscess 10/05/2012  . Hyperlipidemia 08/17/2012  . Osteoarthritis 08/17/2012  . Seizure disorder ([place]) 08/16/2012  . Hip pain 12/12/2011  . 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 CAD (coronary artery disease) 07/07/2011  . Diabetes mellitus, type 2 ([place]) 07/07/2011  . HTN (hypertension) 07/07/2011   Stephaun Million T Tomasita Morrow, OTR/L, CLT  Jaceon Heiberger 08/22/2020, 1:24 PM  [place] MAIN [facility] SERVICES [address] [place], [place], [address] Phone: [phone]   Fax:  [phone]  Name: Ian King MRN: [id number] Date of Birth: 1941/05/28

## 2020-08-22 NOTE — Therapy (Signed)
[facility] SERVICES [address] [place], [place], [address] Phone: [phone]   Fax:  [phone]  Occupational Therapy Treatment  Patient Details  Name: Ian King MRN: [id number] Date of Birth: 10-Nov-1940 No data recorded  Encounter Date: 08/17/2020   OT End of Session - 08/22/20 1201    Visit Number 6    Number of Visits 17    Date for OT Re-Evaluation 09/11/20    OT Start Time 1301    OT Stop Time 1400    OT Time Calculation (min) 59 min    Activity Tolerance Patient tolerated treatment well    Behavior During Therapy [facility] for tasks assessed/performed           Past Medical History:  Diagnosis Date  . A-fib ([place])   . Diabetes mellitus without complication ([place])   . Hypertension     Past Surgical History:  Procedure Laterality Date  . arm surgery    . BACK SURGERY    . JOINT REPLACEMENT     hip  . JOINT REPLACEMENT Right    knee  . SPINE SURGERY      There were no vitals filed for this visit.   Subjective Assessment - 08/22/20 1159    Subjective  Patient reports he had a good weekend, did his exercises.  Wife reports they did not feel comfortable with doing the side to side exercise with patient sitting on the edge of the chair.    Patient Stated Goals Patient reports he would like to be as independent as he can for himself.  "I want to do as much as I can."    Currently in Pain? No/denies    Pain Score 0-No pain           Neuromuscular Reeducation to facilitate proper movement patterns following principles of LSVT BIG:  Performance ofLSVT Daily Session Maximal Daily Exercises: Sustained movements are designed to rescale the amplitude of movement output for generalization to daily functional activities. Performed as follows for 1 set of 10 repetitions each: Multi directional sustained movements- 1) Floor to ceiling, 2) Side to side. Multi directional Repetitive movements performed in standing and are  designed to provide retraining effort needed for sustained muscle activation in tasks Performed as follows: 3) Step and reach forward, 4) Step and Reach Backwards, 5) Step and reach sideways, 6) Rock and reach forward/backward, 7) Rock and reach sideways. Patient instructed on exercises in and adapted version with therapist providing min assist for balance and proper form and technique.   Ptcontinues tobenefit from slow instruction, repetition along with verbal and tactile cues.  Sit to stand 10 times from mat table with cues for arm positioning, mat at lowest position this date and with supervision provided.   Functional mobility cane utilized and verbal/tactile cues for arm swing on left, 2 trials of 200 feet.  Functional component tasks, all performed 5 reps each and with therapist demo and cues. 1) sit to stand from lower surfaces supervision this date 2) reaching back with left hand to wipe after toileting 3) Handwriting pen strokes are small producing decreased legibility and micrographia. 4) reaching to the back of the head to comb hair 5) Reaching into left pocket of pants or coat  Response to tx:  Pt continues to demo difficulty with right left discrimination and requires increased time to process information verbally.  Responds best to therapist guiding through the motions for the first couple repetitions to set  the pace and motion and then patient able to follow and complete the remaining reps with therapist mirroring and providing verbal cues.  Difficulty with reaching back for simulated toilet hygiene and with reaching back of head, limited in both range of motion however motion improves with repetitions.  Has responded well to cues during functional mobility for reciprocal arm swing.  Patient is showing progress with recall of exercises and following repetitive patterns.  Continue towards goals.                     OT Education - 08/22/20 1200    Education  provided Yes    Education Details LSVT BIG exercises, balance, functional component tasks, reciprocal arm swing    Person(s) Educated Patient;Spouse    Methods Explanation;Demonstration;Tactile cues    Comprehension Verbalized understanding;Returned demonstration;Need further instruction            OT Short Term Goals - 04/07/17 1555      OT SHORT TERM GOAL #1   Title Pt to be ind in HEP to increase AROM in digits and wrist - splint wearing for Mal[place]/wrist     Status Achieved      OT SHORT TERM GOAL #2   Title AROM in L 3rd digit improve for pt to touch palm without extention lag more than -10 degrees      Baseline extention lag -15 degrees     Status Partially Met      OT SHORT TERM GOAL #3   Title Pain on PRWHE improve with 15 points     Baseline at eval pain on PRWHE 26/50- pain now less than 5/50     Status Achieved      OT SHORT TERM GOAL #4   Title L wrist AROM improve for pt turn page, take change in palm, eat 50% with L hand     Status Achieved             OT Long Term Goals - 08/07/20 0754      OT LONG TERM GOAL #1   Title Patient will improve gait speed and endurance to be able to ambulate 900 feet in 6 minutes to negotiate around the home and community safely in 4 weeks.    Baseline 740 feet at eval    Time 4    Period Weeks    Status New    Target Date 09/11/20      OT LONG TERM GOAL #2   Title Patient will complete HEP for maximal daily exercises with min assist in 4 weeks.    Baseline no current program    Time 4    Period Weeks    Status New    Target Date 09/11/20      OT LONG TERM GOAL #3   Title Patient will transfer from to sit to stand without the use of arms safely and independently from a variety of chairs/surfaces in 4 weeks.    Baseline difficulty with low surfaces    Time 4    Period Weeks    Status New    Target Date 09/11/20      OT LONG TERM GOAL #4   Title Will assess BERG balance next session    Baseline unable to perform  at eval due to time constraints, patient has functional balance deficits during eval    Time 1    Period Weeks    Status New    Target Date 08/14/20  OT LONG TERM GOAL #5   Title Patient will complete light meal prep with supervision.    Baseline unable at eval    Time 4    Period Weeks    Status New    Target Date 09/11/20      Long Term Additional Goals   Additional Long Term Goals Yes      OT LONG TERM GOAL #6   Title Patient will demonstrate improvements in handwriting with legiblity greater than 80% for printing and signing name on important papers.    Baseline 50% legiblity at eval, mircrographia noted.    Time 4    Period Weeks    Status New    Target Date 09/11/20                 Plan - 08/22/20 1201    Clinical Impression Statement Pt continues to demo difficulty with right left discrimination and requires increased time to process information verbally.  Responds best to therapist guiding through the motions for the first couple repetitions to set the pace and motion and then patient able to follow and complete the remaining reps with therapist mirroring and providing verbal cues.  Difficulty with reaching back for simulated toilet hygiene and with reaching back of head, limited in both range of motion however motion improves with repetitions.  Has responded well to cues during functional mobility for reciprocal arm swing.  Patient is showing progress with recall of exercises and following repetitive patterns.  Continue towards goals.    OT Occupational Profile and History Detailed Assessment- Review of Records and additional review of physical, cognitive, psychosocial history related to current functional performance    Occupational performance deficits (Please refer to evaluation for details): ADL's;IADL's;Play;Leisure;Social Participation    Body Structure / Function / Physical Skills ADL;Dexterity;Flexibility;ROM;Strength;Balance;Coordination;FMC;IADL;Body  mechanics;Gait;Endurance;UE functional use;Decreased knowledge of use of DME;GMC;Mobility    Cognitive Skills Attention;Memory;Sequencing    Psychosocial Skills Environmental  Adaptations;Habits;Routines and Behaviors    Rehab Potential Good    Clinical Decision Making Limited treatment options, no task modification necessary    Comorbidities Affecting Occupational Performance: Presence of comorbidities impacting occupational performance    Comorbidities impacting occupational performance description: dementia, chronic afib, h/o seizures, CVA, h/o back surgery/pain, HTN, anklyosing spondylitis    Modification or Assistance to Complete Evaluation  No modification of tasks or assist necessary to complete eval    OT Frequency 4x / week    OT Duration 4 weeks    OT Treatment/Interventions Self-care/ADL training;Cryotherapy;Therapeutic exercise;DME and/or AE instruction;Functional Mobility Training;Cognitive remediation/compensation;Balance training;Neuromuscular education;Gait Training;Manual Therapy;Moist Heat;Therapeutic activities;Patient/family education;Stair Training    Consulted and Agree with Plan of Care Patient;Family member/caregiver    Family Member Consulted Belenda Cruise, wife           Patient will benefit from skilled therapeutic intervention in order to improve the following deficits and impairments:   Body Structure / Function / Physical Skills: ADL, Dexterity, Flexibility, ROM, Strength, Balance, Coordination, FMC, IADL, Body mechanics, Gait, Endurance, UE functional use, Decreased knowledge of use of DME, GMC, Mobility Cognitive Skills: Attention, Memory, Sequencing Psychosocial Skills: Environmental  Adaptations, Habits, Routines and Behaviors   Visit Diagnosis: Unsteadiness on feet  Muscle weakness (generalized)  Other lack of coordination  Difficulty in walking, not elsewhere classified  Abnormal posture    Problem List Patient Active Problem List   Diagnosis  Date Noted  . Raynaud phenomenon 03/22/2020  . Chronic venous insufficiency 03/22/2020  . B12 deficiency 03/10/2020  . Acute ischemic multifocal  anterior circulation stroke ([place]) 03/09/2020  . Pseudophakia of left eye 07/02/2018  . Obesity, Class I, BMI 30-34.9 02/15/2018  . Closed displaced fracture of distal phalanx of left middle finger 03/08/2017  . Closed fracture of distal end of left radius with routine healing 01/16/2017  . Closed fracture of nasal bone with routine healing 01/12/2017  . Ankylosing spondylitis ([place]) 12/19/2016  . Closed fracture of dorsal (thoracic) vertebra ([place]) 12/19/2016  . Fall 12/19/2016  . Fracture of left radius 12/19/2016  . Primary osteoarthritis of right knee 10/14/2015  . Acute pain of right knee 09/09/2015  . Aftercare following joint replacement surgery 07/31/2015  . Closed nondisplaced fracture of lateral condyle of femur ([place]) 07/31/2015  . Bilateral edema of lower extremity 06/26/2014  . Chronic atrial fibrillation ([place]) 07/13/2013  . Wound disruption, post-op, skin 10/11/2012  . Cellulitis and abscess 10/05/2012  . Hyperlipidemia 08/17/2012  . Osteoarthritis 08/17/2012  . Seizure disorder ([place]) 08/16/2012  . Hip pain 12/12/2011  . 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 CAD (coronary artery disease) 07/07/2011  . Diabetes mellitus, type 2 ([place]) 07/07/2011  . HTN (hypertension) 07/07/2011   Ian King, OTR/L, CLT  Aarna Mihalko 08/22/2020, 12:17 PM  [place] MAIN [facility] SERVICES [address] [place], [place], [address] Phone: [phone]   Fax:  [phone]  Name: RISHON THILGES MRN: [id number] Date of Birth: 03-28-1941

## 2020-08-22 NOTE — Therapy (Signed)
[place] MAIN [facility] SERVICES [address] [place], [place], [address] Phone: [phone]   Fax:  [phone]  Occupational Therapy Treatment  Patient Details  Name: Ian King MRN: [id number] Date of Birth: Mar 12, 1941 No data recorded  Encounter Date: 08/20/2020   OT End of Session - 08/22/20 1432    Visit Number 9    Number of Visits 17    Date for OT Re-Evaluation 09/11/20    OT Start Time 1300    OT Stop Time 1405    OT Time Calculation (min) 65 min    Activity Tolerance Patient tolerated treatment well    Behavior During Therapy [facility] for tasks assessed/performed           Past Medical History:  Diagnosis Date  . A-fib ([place])   . Diabetes mellitus without complication ([place])   . Hypertension     Past Surgical History:  Procedure Laterality Date  . arm surgery    . BACK SURGERY    . JOINT REPLACEMENT     hip  . JOINT REPLACEMENT Right    knee  . SPINE SURGERY      There were no vitals filed for this visit.   Subjective Assessment - 08/22/20 1432    Subjective  Patient reports "I am half way done!"    Patient is accompanied by: Family member    Patient Stated Goals Patient reports he would like to be as independent as he can for himself.  "I want to do as much as I can."    Currently in Pain? No/denies    Pain Score 0-No pain           Neuromuscular Reeducation to facilitate proper movement patterns following principles of LSVT BIG:  Performance ofLSVT Daily Session Maximal Daily Exercises: Sustained movements are designed to rescale the amplitude of movement output for generalization to daily functional activities. Performed as follows for 1 set of 10 repetitions each: Multi directional sustained movements- 1) Floor to ceiling, 2) Side to side. Multi directional Repetitive movements performed in standing and are designed to provide retraining effort needed for sustained muscle activation in tasks Performed as  follows: 3) Step and reach forward, 4) Step and Reach Backwards, 5) Step and reach sideways, 6) Rock and reach forward/backward, 7) Rock and reach sideways. Patient instructed on exercises inand adaptedversion with therapist providing minassist for balance and proper form and technique.   Ptcontinues tobenefit from slow instruction, repetition along with verbal and tactile cues. Continued focus on increasing arm movements and hands, cues for opening BIG during exercises. Additional focus on posture this date while performing exercises, with sit to stand and during functional mobility, tends to stay in forwards flexed posture.  Sit to stand 10 times from mat table with cues for arm positioning, mat at lowest position this dateand with supervision provided.  Functional mobility cane utilized and verbal/tactile cues for arm swing on left, mobility today to and from the clinic for 200 feet each direction.    Functional component tasks, all performed 5 reps each and with therapist demo and cues. 1) sit to stand from lower surfacessupervision this date 2) reaching back with left hand to wipe after toileting 3) Handwriting pen strokes are small producing decreased legibility and micrographia. 4) reaching to the back of the head to comb hair 5)Reaching into left pocket of pants or coat 6) Manipulation of buttons   Added hand flicks prior to select functional component tasks and  added button manipulation as well to the list.    Response to tx: Patient with good progress overall this week in all areas now able to complete exercises with min assist and cues, he will continue with exercises at home over the weekend, 2 times a day for 10 reps each.  We will plan to reassess measures on his next visit for a progress update.  Continue OT to maximize safety and independence in necessary daily tasks.                      OT Education - 08/22/20 1432    Education provided  Yes    Education Details LSVT BIG exercises, balance, functional component tasks, reciprocal arm swing, hand flicks    Person(s) Educated Patient;Spouse    Methods Explanation;Demonstration;Tactile cues    Comprehension Verbalized understanding;Returned demonstration;Need further instruction            OT Short Term Goals - 04/07/17 1555      OT SHORT TERM GOAL #1   Title Pt to be ind in HEP to increase AROM in digits and wrist - splint wearing for [place]/wrist     Status Achieved      OT SHORT TERM GOAL #2   Title AROM in L 3rd digit improve for pt to touch palm without extention lag more than -10 degrees      Baseline extention lag -15 degrees     Status Partially Met      OT SHORT TERM GOAL #3   Title Pain on PRWHE improve with 15 points     Baseline at eval pain on PRWHE 26/50- pain now less than 5/50     Status Achieved      OT SHORT TERM GOAL #4   Title L wrist AROM improve for pt turn page, take change in palm, eat 50% with L hand     Status Achieved             OT Long Term Goals - 08/07/20 0754      OT LONG TERM GOAL #1   Title Patient will improve gait speed and endurance to be able to ambulate 900 feet in 6 minutes to negotiate around the home and community safely in 4 weeks.    Baseline 740 feet at eval    Time 4    Period Weeks    Status New    Target Date 09/11/20      OT LONG TERM GOAL #2   Title Patient will complete HEP for maximal daily exercises with min assist in 4 weeks.    Baseline no current program    Time 4    Period Weeks    Status New    Target Date 09/11/20      OT LONG TERM GOAL #3   Title Patient will transfer from to sit to stand without the use of arms safely and independently from a variety of chairs/surfaces in 4 weeks.    Baseline difficulty with low surfaces    Time 4    Period Weeks    Status New    Target Date 09/11/20      OT LONG TERM GOAL #4   Title Will assess BERG balance next session    Baseline unable to  perform at eval due to time constraints, patient has functional balance deficits during eval    Time 1    Period Weeks    Status New    Target  Date 08/14/20      OT LONG TERM GOAL #5   Title Patient will complete light meal prep with supervision.    Baseline unable at eval    Time 4    Period Weeks    Status New    Target Date 09/11/20      Long Term Additional Goals   Additional Long Term Goals Yes      OT LONG TERM GOAL #6   Title Patient will demonstrate improvements in handwriting with legiblity greater than 80% for printing and signing name on important papers.    Baseline 50% legiblity at eval, mircrographia noted.    Time 4    Period Weeks    Status New    Target Date 09/11/20                 Plan - 08/22/20 1434    Clinical Impression Statement Patient with good progress overall this week in all areas now able to complete exercises with min assist and cues, he will continue with exercises at home over the weekend, 2 times a day for 10 reps each.  We will plan to reassess measures on his next visit for a progress update.  Continue OT to maximize safety and independence in necessary daily tasks.    OT Occupational Profile and History Detailed Assessment- Review of Records and additional review of physical, cognitive, psychosocial history related to current functional performance    Occupational performance deficits (Please refer to evaluation for details): ADL's;IADL's;Play;Leisure;Social Participation    Body Structure / Function / Physical Skills ADL;Dexterity;Flexibility;ROM;Strength;Balance;Coordination;FMC;IADL;Body mechanics;Gait;Endurance;UE functional use;Decreased knowledge of use of DME;GMC;Mobility    Cognitive Skills Attention;Memory;Sequencing    Psychosocial Skills Environmental  Adaptations;Habits;Routines and Behaviors    Rehab Potential Good    Clinical Decision Making Limited treatment options, no task modification necessary    Comorbidities  Affecting Occupational Performance: Presence of comorbidities impacting occupational performance    Comorbidities impacting occupational performance description: dementia, chronic afib, h/o seizures, CVA, h/o back surgery/pain, HTN, anklyosing spondylitis    Modification or Assistance to Complete Evaluation  No modification of tasks or assist necessary to complete eval    OT Frequency 4x / week    OT Duration 4 weeks    OT Treatment/Interventions Self-care/ADL training;Cryotherapy;Therapeutic exercise;DME and/or AE instruction;Functional Mobility Training;Cognitive remediation/compensation;Balance training;Neuromuscular education;Gait Training;Manual Therapy;Moist Heat;Therapeutic activities;Patient/family education;Stair Training    Consulted and Agree with Plan of Care Patient;Family member/caregiver    Family Member Consulted Belenda Cruise, wife           Patient will benefit from skilled therapeutic intervention in order to improve the following deficits and impairments:   Body Structure / Function / Physical Skills: ADL, Dexterity, Flexibility, ROM, Strength, Balance, Coordination, FMC, IADL, Body mechanics, Gait, Endurance, UE functional use, Decreased knowledge of use of DME, GMC, Mobility Cognitive Skills: Attention, Memory, Sequencing Psychosocial Skills: Environmental  Adaptations, Habits, Routines and Behaviors   Visit Diagnosis: Unsteadiness on feet  Muscle weakness (generalized)  Other lack of coordination  Difficulty in walking, not elsewhere classified  Abnormal posture    Problem List Patient Active Problem List   Diagnosis Date Noted  . Raynaud phenomenon 03/22/2020  . Chronic venous insufficiency 03/22/2020  . B12 deficiency 03/10/2020  . Acute ischemic multifocal anterior circulation stroke ([place]) 03/09/2020  . Pseudophakia of left eye 07/02/2018  . Obesity, Class I, BMI 30-34.9 02/15/2018  . Closed displaced fracture of distal phalanx of left middle finger  03/08/2017  . Closed fracture of distal  end of left radius with routine healing 01/16/2017  . Closed fracture of nasal bone with routine healing 01/12/2017  . Ankylosing spondylitis ([place]) 12/19/2016  . Closed fracture of dorsal (thoracic) vertebra ([place]) 12/19/2016  . Fall 12/19/2016  . Fracture of left radius 12/19/2016  . Primary osteoarthritis of right knee 10/14/2015  . Acute pain of right knee 09/09/2015  . Aftercare following joint replacement surgery 07/31/2015  . Closed nondisplaced fracture of lateral condyle of femur ([place]) 07/31/2015  . Bilateral edema of lower extremity 06/26/2014  . Chronic atrial fibrillation ([place]) 07/13/2013  . Wound disruption, post-op, skin 10/11/2012  . Cellulitis and abscess 10/05/2012  . Hyperlipidemia 08/17/2012  . Osteoarthritis 08/17/2012  . Seizure disorder ([place]) 08/16/2012  . Hip pain 12/12/2011  . 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 CAD (coronary artery disease) 07/07/2011  . Diabetes mellitus, type 2 ([place]) 07/07/2011  . HTN (hypertension) 07/07/2011   Seung Nidiffer T Tomasita Morrow, OTR/L, CLT  Iraida Cragin 08/22/2020, 2:54 PM  [place] MAIN [facility] SERVICES [address] [place], [place], [address] Phone: [phone]   Fax:  [phone]  Name: Ian King MRN: [id number] Date of Birth: 04-05-1941

## 2020-08-24 ENCOUNTER — Ambulatory Visit: Payer: Medicare PPO | Admitting: Speech Pathology

## 2020-08-24 ENCOUNTER — Other Ambulatory Visit: Payer: Self-pay

## 2020-08-24 ENCOUNTER — Ambulatory Visit: Payer: Medicare PPO | Admitting: Occupational Therapy

## 2020-08-24 DIAGNOSIS — R293 Abnormal posture: Secondary | ICD-10-CM

## 2020-08-24 DIAGNOSIS — R41841 Cognitive communication deficit: Secondary | ICD-10-CM

## 2020-08-24 DIAGNOSIS — R262 Difficulty in walking, not elsewhere classified: Secondary | ICD-10-CM | POA: Diagnosis not present

## 2020-08-24 DIAGNOSIS — M6281 Muscle weakness (generalized): Secondary | ICD-10-CM

## 2020-08-24 DIAGNOSIS — R2681 Unsteadiness on feet: Secondary | ICD-10-CM

## 2020-08-24 DIAGNOSIS — R278 Other lack of coordination: Secondary | ICD-10-CM

## 2020-08-25 ENCOUNTER — Encounter: Payer: Self-pay | Admitting: Speech Pathology

## 2020-08-25 ENCOUNTER — Ambulatory Visit: Payer: Medicare PPO | Admitting: Speech Pathology

## 2020-08-25 ENCOUNTER — Ambulatory Visit: Payer: Medicare PPO | Admitting: Occupational Therapy

## 2020-08-25 ENCOUNTER — Other Ambulatory Visit: Payer: Self-pay

## 2020-08-25 DIAGNOSIS — R262 Difficulty in walking, not elsewhere classified: Secondary | ICD-10-CM

## 2020-08-25 DIAGNOSIS — R2681 Unsteadiness on feet: Secondary | ICD-10-CM

## 2020-08-25 DIAGNOSIS — M6281 Muscle weakness (generalized): Secondary | ICD-10-CM

## 2020-08-25 DIAGNOSIS — R278 Other lack of coordination: Secondary | ICD-10-CM

## 2020-08-25 DIAGNOSIS — R293 Abnormal posture: Secondary | ICD-10-CM

## 2020-08-25 NOTE — Therapy (Signed)
[facility] MAIN [facility] SERVICES [address] [place], [place], [address] Phone: [phone]   Fax:  [phone]  Speech Language Pathology Treatment  Patient Details  Name: Ian King MRN: [id number] Date of Birth: 05-01-1941 Referring Provider (SLP): Dr. Sherryll King   Encounter Date: 08/24/2020   End of Session - 08/25/20 2127    Visit Number 6    Number of Visits 17    Date for SLP Re-Evaluation 10/02/20    Authorization Type Medicare    Authorization Time Period Start 08/05/2020    Authorization - Visit Number 6    Progress Note Due on Visit 10    SLP Start Time 1400    SLP Stop Time  1500    SLP Time Calculation (min) 60 min    Activity Tolerance Patient tolerated treatment well           Past Medical History:  Diagnosis Date  . A-fib ([facility])   . Diabetes mellitus without complication ([facility])   . Hypertension     Past Surgical History:  Procedure Laterality Date  . arm surgery    . BACK SURGERY    . JOINT REPLACEMENT     hip  . JOINT REPLACEMENT Right    knee  . SPINE SURGERY      There were no vitals filed for this visit.   Subjective Assessment - 08/25/20 2116    Subjective pt demonstrated some defiant behaviors during session    Patient is accompained by: Family member    Currently in Pain? No/denies                 ADULT SLP TREATMENT - 08/25/20 0001      General Information   Behavior/Cognition Alert;Cooperative;Pleasant mood;Confused;Requires cueing;Decreased sustained attention;Distractible;Doesn't follow directions    HPI Ian King is a 79 y.o. male with PMHx of Afib on Xarelto, HLD, former tobacco use, T2DM on metformin, CAD who presented to [facility] 03/08/2020 for evaluation of possible stroke. MRI revealed acute to subacute right ventricle stripe territory infarction, small bilateral acute foci of restricted diffusion at the convexities consistent with acute cortical infarctions and more chronic ischemic  changes bilaterally in the posterior cerebral artery territories and less prominently in the middle cerebral artery territories. MOCA 03/09/2020: 27/30. 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        Treatment Provided   Treatment provided Cognitive-Linquistic      Pain Assessment   Pain Assessment No/denies pain      Cognitive-Linquistic Treatment   Treatment focused on Cognition;Patient/family/caregiver education;Voice    Skilled Treatment Despite extensive maximal level of cues, pt unable to sort binary black and red checkers; pt unable to place 3 cards in order (lowest to highest); minimal assistance to place numbers on corresponding days in large calendar and able to problem solve where to place a missing number; minimal cues to orgnize numbered cards from lowest to highest with 4 models presented by SLP      Assessment / Recommendations / Plan   Plan Continue with current plan of care      Progression Toward Goals   Progression toward  goals Progressing toward goals            SLP Education - 08/25/20 2122    Education Details decreased ability for new learning    Person(s) Educated Patient;Spouse    Methods Explanation;Demonstration;Verbal cues;Tactile cues    Comprehension Need further instruction              SLP Long Term Goals - 08/06/20 1307      SLP LONG TERM GOAL #1   Title Patient and his wife will participate in developing functional compensatory strategies to improve engagement and activity.    Time 8    Period Weeks    Status New     Target Date 10/02/20      SLP LONG TERM GOAL #2   Title Patient will demonstrate functional cognitive-communication skills for independent completion of personal responsibilities and leisure activities.    Time 8    Period Weeks    Status New    Target Date 10/02/20      SLP LONG TERM GOAL #3   Title Patient will generate grammatical and cogent sentence to complete simple/concrete linguistic task with 80% accuracy..    Time 8    Period Weeks    Status New    Target Date 10/02/20      SLP LONG TERM GOAL #4   Title Patient will complete semantic feature word finding tasks with 80% accuracy.    Time 8    Period Weeks    Status New    Target Date 10/02/20            Plan - 08/25/20 2128    Clinical Impression Statement Skilled treatment session targeted pt's cognitive communication goals. Despite extensive multi-modal assistance, pt was not able to sort between 2 binary colors (black and red checkers). There were a couple of occasions when pt appeared defiant and placed checkers in the wrong place despite SLP's direct instruction. With minimal assistance pt was able to place numbers in consecutive order on a calendar.    Speech Therapy Frequency 2x / week    Duration 12 weeks    Treatment/Interventions Cognitive reorganization;Compensatory techniques;SLP instruction and feedback;Patient/family education    Potential to Achieve Goals Good    Potential Considerations Ability to learn/carryover information;Previous level of function;Co-morbidities;Severity of impairments;Cooperation/participation level;Medical prognosis;Family/community support    SLP Home Exercise Plan basic sorting tasks    Consulted and Agree with Plan of Care Patient;Family member/caregiver    Family Member Consulted Spouse           Patient will benefit from skilled therapeutic intervention in order to improve the following deficits and impairments:   Cognitive communication deficit    Problem  List Patient Active Problem List   Diagnosis Date Noted  . Raynaud phenomenon 03/22/2020  . Chronic venous insufficiency 03/22/2020  . B12 deficiency 03/10/2020  . 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 Pseudophakia of left eye 07/02/2018  . Obesity, Class I, BMI 30-34.9 02/15/2018  . Closed displaced fracture of distal phalanx of left middle finger 03/08/2017  . Closed fracture of distal end of left radius with routine healing 01/16/2017  . Closed fracture of nasal bone with routine healing 01/12/2017  . 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  . Closed fracture of dorsal (thoracic) vertebra ([facility]) 12/19/2016  . Fall 12/19/2016  . Fracture of left radius 12/19/2016  . Primary osteoarthritis of right knee 10/14/2015  . Acute pain of right knee 09/09/2015  . Aftercare following joint  replacement surgery 07/31/2015  . Closed nondisplaced fracture of lateral condyle of femur ([facility]) 07/31/2015  . Bilateral edema of lower extremity 06/26/2014  . Chronic atrial fibrillation ([facility]) 07/13/2013  . Wound disruption, post-op, skin 10/11/2012  . Cellulitis and abscess 10/05/2012  . Hyperlipidemia 08/17/2012  . Osteoarthritis 08/17/2012  . Seizure disorder ([facility]) 08/16/2012  . Hip pain 12/12/2011  . 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 CAD (coronary artery disease) 07/07/2011  . Diabetes mellitus, type 2 (HCC) 07/07/2011  . HTN (hypertension) 07/07/2011   Ian King B. Dreama Saa M.S., CCC-SLP, [facility] Speech-Language Pathologist Rehabilitation Services Office [phone]  Ian King 08/25/2020, 9:35 PM  [place] [facility] MAIN [facility] SERVICES [address] [place], [place], [address] Phone: [phone]   Fax:  [phone]   Name: Ian King MRN: [id number] Date of Birth: 11-Mar-1941

## 2020-08-26 ENCOUNTER — Encounter: Payer: Self-pay | Admitting: Occupational Therapy

## 2020-08-26 ENCOUNTER — Other Ambulatory Visit: Payer: Self-pay

## 2020-08-26 ENCOUNTER — Ambulatory Visit: Payer: Medicare PPO | Admitting: Speech Pathology

## 2020-08-26 ENCOUNTER — Ambulatory Visit: Payer: Medicare PPO | Admitting: Occupational Therapy

## 2020-08-26 DIAGNOSIS — R41841 Cognitive communication deficit: Secondary | ICD-10-CM

## 2020-08-26 DIAGNOSIS — R2681 Unsteadiness on feet: Secondary | ICD-10-CM

## 2020-08-26 DIAGNOSIS — R278 Other lack of coordination: Secondary | ICD-10-CM

## 2020-08-26 DIAGNOSIS — M6281 Muscle weakness (generalized): Secondary | ICD-10-CM

## 2020-08-26 DIAGNOSIS — R262 Difficulty in walking, not elsewhere classified: Secondary | ICD-10-CM

## 2020-08-26 DIAGNOSIS — R293 Abnormal posture: Secondary | ICD-10-CM

## 2020-08-26 NOTE — Therapy (Signed)
[place] MAIN [facility] SERVICES [address] [place], [place], [address] Phone: [phone]   Fax:  [phone]  Occupational Therapy Treatment  Patient Details  Name: Ian King MRN: [id number] Date of Birth: 08/20/1941 No data recorded  Encounter Date: 08/26/2020   OT End of Session - 08/26/20 1541    Visit Number 12    Number of Visits 17    Date for OT Re-Evaluation 09/11/20    OT Start Time 1313    OT Stop Time 1400    OT Time Calculation (min) 47 min    Activity Tolerance Patient tolerated treatment well    Behavior During Therapy [facility] for tasks assessed/performed           Past Medical History:  Diagnosis Date  . A-fib ([place])   . Diabetes mellitus without complication ([place])   . Hypertension     Past Surgical History:  Procedure Laterality Date  . arm surgery    . BACK SURGERY    . JOINT REPLACEMENT     hip  . JOINT REPLACEMENT Right    knee  . SPINE SURGERY      There were no vitals filed for this visit.   Subjective Assessment - 08/26/20 1540    Patient Stated Goals Patient reports he would like to be as independent as he can for himself.  "I want to do as much as I can."    Currently in Pain? Yes    Pain Score 5     Pain Location Rib cage    Pain Orientation Right    Pain Descriptors / Indicators Aching    Pain Type Acute pain    Pain Onset Yesterday    Pain Frequency Intermittent           Neuromuscular Reeducation to facilitate proper movement patterns following principles of LSVT BIG:  Performance ofLSVT Daily Session Maximal Daily Exercises: Sustained movements are designed to rescale the amplitude of movement output for generalization to daily functional activities. Performed as follows for 1 set of 10 repetitions each: Multi directional sustained movements- 1) Floor to ceiling, 2) Side to side. Multi directional Repetitive movements performed in standing and are designed to provide retraining  effort needed for sustained muscle activation in tasks Performed as follows: 3) Step and reach forward, 4) Step and Reach Backwards, 5) Step and reach sideways, 6) Rock and reach forward/backward, 7) Rock and reach sideways. Patient instructed on exercises inand adaptedversion with therapist providing minassist for balance and proper form and technique. Increased guiding with UEs today to coordinate with lower extremity reciprocal movement patterns.    Ptcontinues tobenefit from slow instruction, repetition along with verbal and tactile cues. Continued focuson increasing arm movements and hands, cues for opening BIG during exercises. Short distance ambulation this date to and from the clinic area with min guard, cane and cues for arm swing as well as increasing amplitude of steps.   Functional component tasks, all performed 5 reps each and with therapist demo and cues. 1) sit to stand from lower surfacessupervision this date 2) reaching back with left hand to wipe after toileting 3) Handwriting pen strokes are small producing decreased legibility and micrographia. 4) reaching to the back of the head to comb hair 5)Reaching into left pocket of pants or coat 6) Manipulation of buttons, hand flicks performed prior to and during to assist with movements   Increased difficulty with buttons this date and with sit to stand transitions with  pain in right rib cage area from recent fall.       Response to tx: Pt was late to therapy today due to a back up of traffic today.  Pt complaining of increased pain today in right rib cage area from recent fall.  Wife reports she may go by walk in clinic to get patient checked.  Patient slower to move this date and had difficulty with recalling some of his exercises today.  Deferred repeated trials of sit to stand since this movement caused pain.  Patient was able to perform all other exercises with assist and cues as noted above and as he was able to  tolerate.  Difficulty with buttons today even with focus on hand flicks during task.  Continue to work towards goals in plan of care.  Will monitor pain level and adjust exercises as needed.                OT Education - 08/26/20 1541    Education provided Yes    Education Details LSVT BIG exercises, balance, functional component tasks, reciprocal arm swing, hand flicks, safety    Person(s) Educated Patient;Spouse    Methods Explanation;Demonstration;Tactile cues    Comprehension Verbalized understanding;Returned demonstration;Need further instruction            OT Short Term Goals - 04/07/17 1555      OT SHORT TERM GOAL #1   Title Pt to be ind in HEP to increase AROM in digits and wrist - splint wearing for [place]/wrist     Status Achieved      OT SHORT TERM GOAL #2   Title AROM in L 3rd digit improve for pt to touch palm without extention lag more than -10 degrees      Baseline extention lag -15 degrees     Status Partially Met      OT SHORT TERM GOAL #3   Title Pain on PRWHE improve with 15 points     Baseline at eval pain on PRWHE 26/50- pain now less than 5/50     Status Achieved      OT SHORT TERM GOAL #4   Title L wrist AROM improve for pt turn page, take change in palm, eat 50% with L hand     Status Achieved             OT Long Term Goals - 08/26/20 1442      OT LONG TERM GOAL #1   Title Patient will improve gait speed and endurance to be able to ambulate 900 feet in 6 minutes to negotiate around the home and community safely in 4 weeks.    Baseline 740 feet at eval, 10th visit 745 feet and had to rest up to 1 min during testing    Time 4    Period Weeks    Status On-going    Target Date 09/11/20      OT LONG TERM GOAL #2   Title Patient will complete HEP for maximal daily exercises with min assist in 4 weeks.    Baseline no current program at eval, 10th visit-becoming more familiar with HEP and with assist from wife    Time 4    Period Weeks     Status On-going    Target Date 09/11/20      OT LONG TERM GOAL #3   Title Patient will transfer from to sit to stand without the use of arms safely and independently from a variety of chairs/surfaces  in 4 weeks.    Baseline difficulty with low surfaces, 10th visit: still has difficulty from lower surfaces such as couch    Time 4    Period Weeks    Status On-going    Target Date 09/11/20      OT LONG TERM GOAL #4   Title Will assess BERG balance next session    Baseline unable to perform at eval due to time constraints, patient has functional balance deficits during eval    Time 1    Period Weeks    Status Achieved      OT LONG TERM GOAL #5   Title Patient will complete light meal prep with supervision.    Baseline unable at eval, 10th visit:  continues to require assist from wife    Time 4    Period Weeks    Status On-going    Target Date 09/11/20      OT LONG TERM GOAL #6   Title Patient will demonstrate improvements in handwriting with legiblity greater than 80% for printing and signing name on important papers.    Baseline 50% legiblity at eval, mircrographia noted. 10th visit:  improved to 60%    Time 4    Period Weeks    Status On-going    Target Date 09/11/20                 Plan - 08/26/20 1542    Clinical Impression Statement Pt was late to therapy today due to a back up of traffic today.  Pt complaining of increased pain today in right rib cage area from recent fall.  Wife reports she may go by walk in clinic to get patient checked.  Patient slower to move this date and had difficulty with recalling some of his exercises today.  Deferred repeated trials of sit to stand since this movement caused pain.  Patient was able to perform all other exercises with assist and cues as noted above and as he was able to tolerate.  Difficulty with buttons today even with focus on hand flicks during task.  Continue to work towards goals in plan of care.  Will monitor pain  level and adjust exercises as needed.    OT Occupational Profile and History Detailed Assessment- Review of Records and additional review of physical, cognitive, psychosocial history related to current functional performance    Occupational performance deficits (Please refer to evaluation for details): ADL's;IADL's;Play;Leisure;Social Participation    Body Structure / Function / Physical Skills ADL;Dexterity;Flexibility;ROM;Strength;Balance;Coordination;FMC;IADL;Body mechanics;Gait;Endurance;UE functional use;Decreased knowledge of use of DME;GMC;Mobility    Cognitive Skills Attention;Memory;Sequencing    Psychosocial Skills Environmental  Adaptations;Habits;Routines and Behaviors    Rehab Potential Good    Clinical Decision Making Limited treatment options, no task modification necessary    Comorbidities Affecting Occupational Performance: Presence of comorbidities impacting occupational performance    Comorbidities impacting occupational performance description: dementia, chronic afib, h/o seizures, CVA, h/o back surgery/pain, HTN, anklyosing spondylitis    Modification or Assistance to Complete Evaluation  No modification of tasks or assist necessary to complete eval    OT Frequency 4x / week    OT Duration 4 weeks    OT Treatment/Interventions Self-care/ADL training;Cryotherapy;Therapeutic exercise;DME and/or AE instruction;Functional Mobility Training;Cognitive remediation/compensation;Balance training;Neuromuscular education;Gait Training;Manual Therapy;Moist Heat;Therapeutic activities;Patient/family education;Stair Training    Consulted and Agree with Plan of Care Patient;Family member/caregiver    Family Member Consulted Belenda Cruise, wife           Patient will benefit from skilled therapeutic  intervention in order to improve the following deficits and impairments:   Body Structure / Function / Physical Skills: ADL, Dexterity, Flexibility, ROM, Strength, Balance, Coordination, FMC, IADL,  Body mechanics, Gait, Endurance, UE functional use, Decreased knowledge of use of DME, GMC, Mobility Cognitive Skills: Attention, Memory, Sequencing Psychosocial Skills: Environmental  Adaptations, Habits, Routines and Behaviors   Visit Diagnosis: Unsteadiness on feet  Muscle weakness (generalized)  Other lack of coordination  Difficulty in walking, not elsewhere classified  Abnormal posture    Problem List Patient Active Problem List   Diagnosis Date Noted  . Raynaud phenomenon 03/22/2020  . Chronic venous insufficiency 03/22/2020  . B12 deficiency 03/10/2020  . Acute ischemic multifocal anterior circulation stroke ([place]) 03/09/2020  . Pseudophakia of left eye 07/02/2018  . Obesity, Class I, BMI 30-34.9 02/15/2018  . Closed displaced fracture of distal phalanx of left middle finger 03/08/2017  . Closed fracture of distal end of left radius with routine healing 01/16/2017  . Closed fracture of nasal bone with routine healing 01/12/2017  . Ankylosing spondylitis ([place]) 12/19/2016  . Closed fracture of dorsal (thoracic) vertebra ([place]) 12/19/2016  . Fall 12/19/2016  . Fracture of left radius 12/19/2016  . Primary osteoarthritis of right knee 10/14/2015  . Acute pain of right knee 09/09/2015  . Aftercare following joint replacement surgery 07/31/2015  . Closed nondisplaced fracture of lateral condyle of femur ([place]) 07/31/2015  . Bilateral edema of lower extremity 06/26/2014  . Chronic atrial fibrillation ([place]) 07/13/2013  . Wound disruption, post-op, skin 10/11/2012  . Cellulitis and abscess 10/05/2012  . Hyperlipidemia 08/17/2012  . Osteoarthritis 08/17/2012  . Seizure disorder ([place]) 08/16/2012  . Hip pain 12/12/2011  . 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 CAD (coronary artery disease) 07/07/2011  . Diabetes mellitus, type 2 ([place]) 07/07/2011  . HTN (hypertension) 07/07/2011   Rahmah Mccamy T Tomasita Morrow, OTR/L, CLT  Sandra Tellefsen 08/26/2020, 3:55 PM  [place] MAIN [facility] SERVICES [address] [place], [place], [address] Phone: [phone]   Fax:  [phone]  Name: Ian King MRN: [id number] Date of Birth: 1941/01/25

## 2020-08-26 NOTE — Therapy (Addendum)
[place] [facility] MAIN [facility] SERVICES [address] [place], [place], [address] Phone: [phone]   Fax:  [phone]  Speech Language Pathology Treatment  Patient Details  Name: Ian King MRN: [id number] Date of Birth: December 21, 1940 Referring Provider (SLP): Dr. Sherryll Burger   Encounter Date: 08/26/2020   End of Session - 08/26/20 1808    Visit Number 7    Number of Visits 17    Date for SLP Re-Evaluation 10/02/20    Authorization Type Medicare    Authorization Time Period Start 08/05/2020    Authorization - Visit Number 7    Progress Note Due on Visit 10    SLP Start Time 1400    SLP Stop Time  1445    SLP Time Calculation (min) 45 min           Past Medical History:  Diagnosis Date  . A-fib ([facility])   . Diabetes mellitus without complication ([facility])   . Hypertension     Past Surgical History:  Procedure Laterality Date  . arm surgery    . BACK SURGERY    . JOINT REPLACEMENT     hip  . JOINT REPLACEMENT Right    knee  . SPINE SURGERY      There were no vitals filed for this visit.          ADULT SLP TREATMENT - 08/26/20 0001      General Information   Behavior/Cognition Alert;Cooperative;Pleasant mood;Confused;Requires cueing;Decreased sustained attention;Distractible;Doesn't follow directions    HPI Ian King is a 79 y.o. male with PMHx of Afib on Xarelto, HLD, former tobacco use, T2DM on metformin, CAD who presented to [facility]/08/2020 for evaluation of possible stroke. MRI revealed acute to subacute right ventricle stripe territory infarction, small bilateral acute foci of restricted diffusion at the convexities consistent with acute cortical infarctions and more chronic ischemic changes bilaterally in the posterior cerebral artery territories and less prominently in the middle cerebral artery territories. MOCA 03/09/2020: 27/30. 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        Treatment Provided   Treatment provided Cognitive-Linquistic   extensive assistance to complete previous tasks including placing cards in numrical order and by suits      Pain Assessment   Pain Assessment No/denies pain      Cognitive-Linquistic Treatment   Treatment focused on Cognition;Patient/family/caregiver education;Voice                SLP Long Term Goals - 08/06/20 1307      SLP LONG TERM GOAL #1   Title Patient and his wife will participate in developing functional compensatory strategies to improve engagement and activity.    Time 8    Period Weeks    Status New    Target Date 10/02/20      SLP LONG TERM GOAL #2   Title Patient will demonstrate functional cognitive-communication skills for independent completion of personal responsibilities and leisure activities.    Time 8    Period Weeks    Status New    Target Date 10/02/20      SLP LONG TERM GOAL #3  Title Patient will generate grammatical and cogent sentence to complete simple/concrete linguistic task with 80% accuracy..    Time 8    Period Weeks    Status New    Target Date 10/02/20      SLP LONG TERM GOAL #4   Title Patient will complete semantic feature word finding tasks with 80% accuracy.    Time 8    Period Weeks    Status New    Target Date 10/02/20           CLINICAL IMPRESSION Skilled treatment session focused on pt's cognition goals. SLP facilitated session by providing extensive assistance (including model and physical assistance) to complete previously learned (and practiced at home) card sorting task.     Patient will benefit from skilled therapeutic intervention in order to improve the following deficits and impairments:   Cognitive communication deficit    Problem List Patient Active Problem List   Diagnosis Date Noted  . Raynaud phenomenon 03/22/2020  . Chronic venous insufficiency 03/22/2020  . B12 deficiency 03/10/2020  . 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 Pseudophakia of left eye 07/02/2018  . Obesity, Class I, BMI 30-34.9 02/15/2018  . Closed displaced fracture of distal phalanx of left middle finger 03/08/2017  . Closed fracture of distal end of left radius with routine healing 01/16/2017  . Closed fracture of nasal bone with routine healing 01/12/2017  . 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  . Closed fracture of dorsal (thoracic) vertebra ([facility]) 12/19/2016  . Fall 12/19/2016  . Fracture of left radius 12/19/2016  . Primary osteoarthritis of right knee 10/14/2015  . Acute pain of right knee 09/09/2015  . Aftercare following joint replacement surgery 07/31/2015  . Closed nondisplaced fracture of lateral condyle of femur ([facility]) 07/31/2015  . Bilateral edema of lower extremity 06/26/2014  . Chronic atrial fibrillation ([facility]) 07/13/2013  . Wound disruption, post-op, skin 10/11/2012  . Cellulitis and abscess 10/05/2012  . Hyperlipidemia 08/17/2012  . Osteoarthritis 08/17/2012  . Seizure disorder ([facility]) 08/16/2012  . Hip pain 12/12/2011  . 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 CAD (coronary artery disease) 07/07/2011  . Diabetes mellitus, type 2 ([facility]) 07/07/2011  . HTN (hypertension) 07/07/2011   Koehn Salehi B. Dreama Saa M.S., CCC-SLP, [facility] Speech-Language Pathologist Rehabilitation Services Office [phone]  Reuel Derby 08/26/2020, 6:09 PM  [place] [facility] MAIN [facility] SERVICES [address] [place], [place], [address] Phone: [phone]   Fax:  [phone]   Name: Ian King MRN:  [id number] Date of Birth: March 26, 1941

## 2020-08-26 NOTE — Therapy (Signed)
[facility] MAIN [facility] SERVICES [address] [place], [place], [address] Phone: [phone]   Fax:  [phone]  Occupational Therapy Treatment  Patient Details  Name: Ian King MRN: [id number] Date of Birth: May 09, 1941 No data recorded  Encounter Date: 08/25/2020   OT End of Session - 08/26/20 1455    Visit Number 11    Number of Visits 17    Date for OT Re-Evaluation 09/11/20    OT Start Time 1301    OT Stop Time 1400    OT Time Calculation (min) 59 min    Activity Tolerance Patient tolerated treatment well    Behavior During Therapy [facility] for tasks assessed/performed           Past Medical History:  Diagnosis Date  . A-fib (HCC)   . Diabetes mellitus without complication (HCC)   . Hypertension     Past Surgical History:  Procedure Laterality Date  . arm surgery    . BACK SURGERY    . JOINT REPLACEMENT     hip  . JOINT REPLACEMENT Right    knee  . SPINE SURGERY      There were no vitals filed for this visit.   Subjective Assessment - 08/26/20 1452    Subjective  Wife reports last night she was asleep and patient got up and was able to move items away from the back door in the sun room and went outside in the middle of the night with only boxers, a t shirt and barefoot, while she was asleep.  A lady saw him and put him in her car and then called 911.  Pt does not recall any of the event and is not sure why he would have tried to leave home.  Wife reports he fell over a blanket when deputies were trying to walk him up the driveway, had some pain in right side of ribs but denied pain today in therapy.    Patient is accompanied by: Family member    Patient Stated Goals Patient reports he would like to be as independent as he can for himself.  "I want to do as much as I can."    Currently in Pain? No/denies    Pain Score 0-No pain           Neuromuscular Reeducation to facilitate proper movement patterns following  principles of LSVT BIG:  Performance ofLSVT Daily Session Maximal Daily Exercises: Sustained movements are designed to rescale the amplitude of movement output for generalization to daily functional activities. Performed as follows for 1 set of 10 repetitions each: Multi directional sustained movements- 1) Floor to ceiling, 2) Side to side. Multi directional Repetitive movements performed in standing and are designed to provide retraining effort needed for sustained muscle activation in tasks Performed as follows: 3) Step and reach forward, 4) Step and Reach Backwards, 5) Step and reach sideways, 6) Rock and reach forward/backward, 7) Rock and reach sideways. Patient instructed on exercises inand adaptedversion with therapist providing minassist for balance and proper form and technique.   Ptcontinues tobenefit from slow instruction, repetition along with verbal and tactile cues. Continued focuson increasing arm movements and hands, cues for opening BIG during exercises. Short distance ambulation this date to and from the clinic area with min guard, cane and cues for arm swing as well as increasing amplitude of steps.   Functional component tasks, all performed 5 reps each and with therapist demo and cues. 1) sit to stand  from lower surfacessupervision this date 2) reaching back with left hand to wipe after toileting 3) Handwriting pen strokes are small producing decreased legibility and micrographia. 4) reaching to the back of the head to comb hair 5)Reaching into left pocket of pants or coat 6) Manipulation of buttons, hand flicks performed prior to and during to assist with movements   Wife planning to meet with their family this week regarding patient incident and ways to help safeguard the house for patient.    Response to tx: Patient denied pain this date on right side although wife reports he was complaining of pain earlier from a fall overnight.  Patient had an incident  last night when he went outside and got "lost" in the neighborhood, he was returned home by sheriff's dept but had a fall when returning home.  Patient was able to perform exercise this date and participate without any complaints of pain.  Recommend wife monitor pain levels and bruising and take patient for follow up care as needed.  Did not perform functional mobility for any long distances, just short distance walking only.  Continue towards goals and will seek out additional resources/information for wife in the home for dementia.                     OT Education - 08/26/20 1454    Education provided Yes    Education Details LSVT BIG exercises, balance, functional component tasks, reciprocal arm swing, hand flicks, safety    Person(s) Educated Patient;Spouse    Methods Explanation;Demonstration;Tactile cues    Comprehension Verbalized understanding;Returned demonstration;Need further instruction               OT Long Term Goals - 08/26/20 1442      OT LONG TERM GOAL #1   Title Patient will improve gait speed and endurance to be able to ambulate 900 feet in 6 minutes to negotiate around the home and community safely in 4 weeks.    Baseline 740 feet at eval, 10th visit 745 feet and had to rest up to 1 min during testing    Time 4    Period Weeks    Status On-going    Target Date 09/11/20      OT LONG TERM GOAL #2   Title Patient will complete HEP for maximal daily exercises with min assist in 4 weeks.    Baseline no current program at eval, 10th visit-becoming more familiar with HEP and with assist from wife    Time 4    Period Weeks    Status On-going    Target Date 09/11/20      OT LONG TERM GOAL #3   Title Patient will transfer from to sit to stand without the use of arms safely and independently from a variety of chairs/surfaces in 4 weeks.    Baseline difficulty with low surfaces, 10th visit: still has difficulty from lower surfaces such as couch    Time 4     Period Weeks    Status On-going    Target Date 09/11/20      OT LONG TERM GOAL #4   Title Will assess BERG balance next session    Baseline unable to perform at eval due to time constraints, patient has functional balance deficits during eval    Time 1    Period Weeks    Status Achieved      OT LONG TERM GOAL #5   Title Patient will complete light meal  prep with supervision.    Baseline unable at eval, 10th visit:  continues to require assist from wife    Time 4    Period Weeks    Status On-going    Target Date 09/11/20      OT LONG TERM GOAL #6   Title Patient will demonstrate improvements in handwriting with legiblity greater than 80% for printing and signing name on important papers.    Baseline 50% legiblity at eval, mircrographia noted. 10th visit:  improved to 60%    Time 4    Period Weeks    Status On-going    Target Date 09/11/20                 Plan - 08/26/20 1456    Clinical Impression Statement Patient denied pain this date on right side although wife reports he was complaining of pain earlier from a fall overnight.  Patient had an incident last night when he went outside and got "lost" in the neighborhood, he was returned home by sheriff's dept but had a fall when returning home.  Patient was able to perform exercise this date and participate without any complaints of pain.  Recommend wife monitor pain levels and bruising and take patient for follow up care as needed.  Did not perform functional mobility for any long distances, just short distance walking only.  Continue towards goals and will seek out additional resources/information for wife in the home for dementia.    OT Occupational Profile and History Detailed Assessment- Review of Records and additional review of physical, cognitive, psychosocial history related to current functional performance    Occupational performance deficits (Please refer to evaluation for details): ADL's;IADL's;Play;Leisure;Social  Participation    Body Structure / Function / Physical Skills ADL;Dexterity;Flexibility;ROM;Strength;Balance;Coordination;FMC;IADL;Body mechanics;Gait;Endurance;UE functional use;Decreased knowledge of use of DME;GMC;Mobility    Cognitive Skills Attention;Memory;Sequencing    Psychosocial Skills Environmental  Adaptations;Habits;Routines and Behaviors    Rehab Potential Good    Clinical Decision Making Limited treatment options, no task modification necessary    Comorbidities Affecting Occupational Performance: Presence of comorbidities impacting occupational performance    Comorbidities impacting occupational performance description: dementia, chronic afib, h/o seizures, CVA, h/o back surgery/pain, HTN, anklyosing spondylitis    Modification or Assistance to Complete Evaluation  No modification of tasks or assist necessary to complete eval    OT Frequency 4x / week    OT Duration 4 weeks    OT Treatment/Interventions Self-care/ADL training;Cryotherapy;Therapeutic exercise;DME and/or AE instruction;Functional Mobility Training;Cognitive remediation/compensation;Balance training;Neuromuscular education;Gait Training;Manual Therapy;Moist Heat;Therapeutic activities;Patient/family education;Stair Training    Consulted and Agree with Plan of Care Patient;Family member/caregiver    Family Member Consulted Natalia LeatherwoodKatherine, wife           Patient will benefit from skilled therapeutic intervention in order to improve the following deficits and impairments:   Body Structure / Function / Physical Skills: ADL, Dexterity, Flexibility, ROM, Strength, Balance, Coordination, FMC, IADL, Body mechanics, Gait, Endurance, UE functional use, Decreased knowledge of use of DME, GMC, Mobility Cognitive Skills: Attention, Memory, Sequencing Psychosocial Skills: Environmental  Adaptations, Habits, Routines and Behaviors   Visit Diagnosis: Unsteadiness on feet  Muscle weakness (generalized)  Other lack of  coordination  Difficulty in walking, not elsewhere classified  Abnormal posture    Problem List Patient Active Problem List   Diagnosis Date Noted  . Raynaud phenomenon 03/22/2020  . Chronic venous insufficiency 03/22/2020  . B12 deficiency 03/10/2020  . 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 Pseudophakia  of left eye 07/02/2018  . Obesity, Class I, BMI 30-34.9 02/15/2018  . Closed displaced fracture of distal phalanx of left middle finger 03/08/2017  . Closed fracture of distal end of left radius with routine healing 01/16/2017  . Closed fracture of nasal bone with routine healing 01/12/2017  . 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  . Closed fracture of dorsal (thoracic) vertebra (HCC) 12/19/2016  . Fall 12/19/2016  . Fracture of left radius 12/19/2016  . Primary osteoarthritis of right knee 10/14/2015  . Acute pain of right knee 09/09/2015  . Aftercare following joint replacement surgery 07/31/2015  . Closed nondisplaced fracture of lateral condyle of femur (HCC) 07/31/2015  . Bilateral edema of lower extremity 06/26/2014  . Chronic atrial fibrillation (HCC) 07/13/2013  . Wound disruption, post-op, skin 10/11/2012  . Cellulitis and abscess 10/05/2012  . Hyperlipidemia 08/17/2012  . Osteoarthritis 08/17/2012  . Seizure disorder (HCC) 08/16/2012  . Hip pain 12/12/2011  . 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 CAD (coronary artery disease) 07/07/2011  . Diabetes mellitus, type 2 (HCC) 07/07/2011  . HTN (hypertension) 07/07/2011   Lamija Besse T Arne Cleveland, OTR/L, CLT  Katelynne Revak 08/26/2020, 3:01 PM  [facility] MAIN [facility] SERVICES [address] [place], [place], [address] Phone: [phone]   Fax:  [phone]  Name: Ian King MRN: [id number] Date of Birth: 1940/10/18

## 2020-08-26 NOTE — Therapy (Signed)
[place] MAIN [facility] SERVICES [address] [place], [place], [address] Phone: [phone]   Fax:  [phone]  Occupational Therapy Treatment/Progress Update  Patient Details  Name: Ian King MRN: [id number] Date of Birth: 01/07/1941 No data recorded  Encounter Date: 08/24/2020   OT End of Session - 08/26/20 1354    Visit Number 10    Number of Visits 17    Date for OT Re-Evaluation 09/11/20    OT Start Time 1300    OT Stop Time 1358    OT Time Calculation (min) 58 min    Activity Tolerance Patient tolerated treatment well    Behavior During Therapy [facility] for tasks assessed/performed           Past Medical History:  Diagnosis Date  . A-fib ([place])   . Diabetes mellitus without complication ([place])   . Hypertension     Past Surgical History:  Procedure Laterality Date  . arm surgery    . BACK SURGERY    . JOINT REPLACEMENT     hip  . JOINT REPLACEMENT Right    knee  . SPINE SURGERY      There were no vitals filed for this visit.   Subjective Assessment - 08/26/20 1352    Subjective  Patient reports he is ready to do his 6 min walk test    Patient is accompanied by: Family member    Patient Stated Goals Patient reports he would like to be as independent as he can for himself.  "I want to do as much as I can."    Currently in Pain? No/denies    Pain Score 0-No pain              [facility] OT Assessment - 08/26/20 1358      Observation/Other Assessments   Focus on Therapeutic Outcomes (FOTO)  42           6 min walk test 775 feet 5 times sit to stand 12 secs BERG balance test 35/56 FOTO update as above  Neuromuscular Reeducation to facilitate proper movement patterns following principles of LSVT BIG:  Performance ofLSVT Daily Session Maximal Daily Exercises: Sustained movements are designed to rescale the amplitude of movement output for generalization to daily functional activities. Performed as follows for 1  set of 10 repetitions each: Multi directional sustained movements- 1) Floor to ceiling, 2) Side to side. Multi directional Repetitive movements performed in standing and are designed to provide retraining effort needed for sustained muscle activation in tasks Performed as follows: 3) Step and reach forward, 4) Step and Reach Backwards, 5) Step and reach sideways, 6) Rock and reach forward/backward, 7) Rock and reach sideways. Patient instructed on exercises inand adaptedversion with therapist providing minassist for balance and proper form and technique.   Ptcontinues tobenefit from slow instruction, repetition along with verbal and tactile cues. Continued focuson increasing arm movements and hands, cues for opening BIG during exercises. Additional focus on posture this date while performing exercises, with sit to stand and during functional mobility, tends to stay in forwards flexed posture.  Functional component tasks, all performed 5 reps each and with therapist demo and cues. 1) sit to stand from lower surfacessupervision this date 2) reaching back with left hand to wipe after toileting 3) Handwriting pen strokes are small producing decreased legibility and micrographia. 4) reaching to the back of the head to comb hair 5)Reaching into left pocket of pants or coat 6) Manipulation of buttons  Continue with hand flicks                 OT Education - 08/26/20 1353    Education provided Yes    Education Details LSVT BIG exercises, balance, functional component tasks, reciprocal arm swing, hand flicks    Person(s) Educated Patient;Spouse    Methods Explanation;Demonstration;Tactile cues    Comprehension Verbalized understanding;Returned demonstration;Need further instruction            OT Short Term Goals - 04/07/17 1555      OT SHORT TERM GOAL #1   Title Pt to be ind in HEP to increase AROM in digits and wrist - splint wearing for [place]/wrist     Status  Achieved      OT SHORT TERM GOAL #2   Title AROM in L 3rd digit improve for pt to touch palm without extention lag more than -10 degrees      Baseline extention lag -15 degrees     Status Partially Met      OT SHORT TERM GOAL #3   Title Pain on PRWHE improve with 15 points     Baseline at eval pain on PRWHE 26/50- pain now less than 5/50     Status Achieved      OT SHORT TERM GOAL #4   Title L wrist AROM improve for pt turn page, take change in palm, eat 50% with L hand     Status Achieved             OT Long Term Goals - 08/26/20 1442      OT LONG TERM GOAL #1   Title Patient will improve gait speed and endurance to be able to ambulate 900 feet in 6 minutes to negotiate around the home and community safely in 4 weeks.    Baseline 740 feet at eval, 10th visit 745 feet and had to rest up to 1 min during testing    Time 4    Period Weeks    Status On-going    Target Date 09/11/20      OT LONG TERM GOAL #2   Title Patient will complete HEP for maximal daily exercises with min assist in 4 weeks.    Baseline no current program at eval, 10th visit-becoming more familiar with HEP and with assist from wife    Time 4    Period Weeks    Status On-going    Target Date 09/11/20      OT LONG TERM GOAL #3   Title Patient will transfer from to sit to stand without the use of arms safely and independently from a variety of chairs/surfaces in 4 weeks.    Baseline difficulty with low surfaces, 10th visit: still has difficulty from lower surfaces such as couch    Time 4    Period Weeks    Status On-going    Target Date 09/11/20      OT LONG TERM GOAL #4   Title Will assess BERG balance next session    Baseline unable to perform at eval due to time constraints, patient has functional balance deficits during eval    Time 1    Period Weeks    Status Achieved      OT LONG TERM GOAL #5   Title Patient will complete light meal prep with supervision.    Baseline unable at eval, 10th  visit:  continues to require assist from wife    Time 4    Period  Weeks    Status On-going    Target Date 09/11/20      OT LONG TERM GOAL #6   Title Patient will demonstrate improvements in handwriting with legiblity greater than 80% for printing and signing name on important papers.    Baseline 50% legiblity at eval, mircrographia noted. 10th visit:  improved to 60%    Time 4    Period Weeks    Status On-going    Target Date 09/11/20                 Plan - 08/26/20 1354    Clinical Impression Statement Pt seen for progress update, improved his 6 min walk test by 45 feet, improved on 5 times sit to stand.  Balance remains impaired but able to show progress with functional balance during exercises but remains a fall risk.  Patient has been able to implement adapted version of exercises at home as a part of his home program with assistance from his wife.  Although patient has dementia, he has become familiar with therapist, able to recall name, is able to recall select exercises and in a sequence with cues.  Patient will need additional focus this week on improving amplitude of movement patterns as well as calibration of movements.  During 6 min walk test he did have to rest for almost a minute this time so he would have likely improved his score more dramatically if he had not rested.  Patient continues to benefit from skilled OT services to maximize safety and independence in necessary daily tasks.    OT Occupational Profile and History Detailed Assessment- Review of Records and additional review of physical, cognitive, psychosocial history related to current functional performance    Occupational performance deficits (Please refer to evaluation for details): ADL's;IADL's;Play;Leisure;Social Participation    Body Structure / Function / Physical Skills ADL;Dexterity;Flexibility;ROM;Strength;Balance;Coordination;FMC;IADL;Body mechanics;Gait;Endurance;UE functional use;Decreased knowledge of  use of DME;GMC;Mobility    Cognitive Skills Attention;Memory;Sequencing    Psychosocial Skills Environmental  Adaptations;Habits;Routines and Behaviors    Rehab Potential Good    Clinical Decision Making Limited treatment options, no task modification necessary    Comorbidities Affecting Occupational Performance: Presence of comorbidities impacting occupational performance    Comorbidities impacting occupational performance description: dementia, chronic afib, h/o seizures, CVA, h/o back surgery/pain, HTN, anklyosing spondylitis    Modification or Assistance to Complete Evaluation  No modification of tasks or assist necessary to complete eval    OT Frequency 4x / week    OT Duration 4 weeks    OT Treatment/Interventions Self-care/ADL training;Cryotherapy;Therapeutic exercise;DME and/or AE instruction;Functional Mobility Training;Cognitive remediation/compensation;Balance training;Neuromuscular education;Gait Training;Manual Therapy;Moist Heat;Therapeutic activities;Patient/family education;Stair Training    Consulted and Agree with Plan of Care Patient;Family member/caregiver    Family Member Consulted Belenda Cruise, wife           Patient will benefit from skilled therapeutic intervention in order to improve the following deficits and impairments:   Body Structure / Function / Physical Skills: ADL, Dexterity, Flexibility, ROM, Strength, Balance, Coordination, FMC, IADL, Body mechanics, Gait, Endurance, UE functional use, Decreased knowledge of use of DME, GMC, Mobility Cognitive Skills: Attention, Memory, Sequencing Psychosocial Skills: Environmental  Adaptations, Habits, Routines and Behaviors   Visit Diagnosis: Unsteadiness on feet  Muscle weakness (generalized)  Other lack of coordination  Difficulty in walking, not elsewhere classified  Abnormal posture    Problem List Patient Active Problem List   Diagnosis Date Noted  . Raynaud phenomenon 03/22/2020  . Chronic venous  insufficiency 03/22/2020  .  B12 deficiency 03/10/2020  . Acute ischemic multifocal anterior circulation stroke ([place]) 03/09/2020  . Pseudophakia of left eye 07/02/2018  . Obesity, Class I, BMI 30-34.9 02/15/2018  . Closed displaced fracture of distal phalanx of left middle finger 03/08/2017  . Closed fracture of distal end of left radius with routine healing 01/16/2017  . Closed fracture of nasal bone with routine healing 01/12/2017  . Ankylosing spondylitis ([place]) 12/19/2016  . Closed fracture of dorsal (thoracic) vertebra ([place]) 12/19/2016  . Fall 12/19/2016  . Fracture of left radius 12/19/2016  . Primary osteoarthritis of right knee 10/14/2015  . Acute pain of right knee 09/09/2015  . Aftercare following joint replacement surgery 07/31/2015  . Closed nondisplaced fracture of lateral condyle of femur ([place]) 07/31/2015  . Bilateral edema of lower extremity 06/26/2014  . Chronic atrial fibrillation ([place]) 07/13/2013  . Wound disruption, post-op, skin 10/11/2012  . Cellulitis and abscess 10/05/2012  . Hyperlipidemia 08/17/2012  . Osteoarthritis 08/17/2012  . Seizure disorder ([place]) 08/16/2012  . Hip pain 12/12/2011  . 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 CAD (coronary artery disease) 07/07/2011  . Diabetes mellitus, type 2 ([place]) 07/07/2011  . HTN (hypertension) 07/07/2011   Carrson Lightcap T Tomasita Morrow, OTR/L, CLT  Leonte Horrigan 08/26/2020, 2:47 PM  [place] MAIN [facility] SERVICES [address] [place], [place], [address] Phone: [phone]   Fax:  [phone]  Name: Ian King MRN: [id number] Date of Birth: [birth year]

## 2020-08-31 ENCOUNTER — Ambulatory Visit: Payer: Medicare PPO | Admitting: Speech Pathology

## 2020-09-01 ENCOUNTER — Ambulatory Visit: Payer: Medicare PPO | Admitting: Speech Pathology

## 2020-09-01 ENCOUNTER — Encounter: Payer: Self-pay | Admitting: Occupational Therapy

## 2020-09-01 ENCOUNTER — Ambulatory Visit: Payer: Medicare PPO | Admitting: Occupational Therapy

## 2020-09-01 ENCOUNTER — Other Ambulatory Visit: Payer: Self-pay

## 2020-09-01 DIAGNOSIS — R262 Difficulty in walking, not elsewhere classified: Secondary | ICD-10-CM

## 2020-09-01 DIAGNOSIS — R293 Abnormal posture: Secondary | ICD-10-CM

## 2020-09-01 DIAGNOSIS — R41841 Cognitive communication deficit: Secondary | ICD-10-CM

## 2020-09-01 DIAGNOSIS — R278 Other lack of coordination: Secondary | ICD-10-CM

## 2020-09-01 DIAGNOSIS — M6281 Muscle weakness (generalized): Secondary | ICD-10-CM

## 2020-09-01 DIAGNOSIS — R2681 Unsteadiness on feet: Secondary | ICD-10-CM

## 2020-09-01 NOTE — Therapy (Signed)
[facility] [place] [facility] MAIN [facility] SERVICES [address] [place], [place], [address] Phone: [phone]   Fax:  [phone]  Occupational Therapy Treatment  Patient Details  Name: Ian King MRN: [id number] Date of Birth: 03-02-1941 No data recorded  Encounter Date: 09/01/2020   OT End of Session - 09/01/20 1854    Visit Number 13    Number of Visits 17    Date for OT Re-Evaluation 09/11/20    OT Start Time 1302    OT Stop Time 1400    OT Time Calculation (min) 58 min    Activity Tolerance Patient tolerated treatment well    Behavior During Therapy [facility] for tasks assessed/performed           Past Medical History:  Diagnosis Date  . A-fib (HCC)   . Diabetes mellitus without complication (HCC)   . Hypertension     Past Surgical History:  Procedure Laterality Date  . arm surgery    . BACK SURGERY    . JOINT REPLACEMENT     hip  . JOINT REPLACEMENT Right    knee  . SPINE SURGERY      There were no vitals filed for this visit.   Subjective Assessment - 09/01/20 1852    Subjective  Pt reports he came to therapy yesterday, thought he had an appt but did not look at the schedule.    Patient is accompanied by: Family member    Patient Stated Goals Patient reports he would like to be as independent as he can for himself.  "I want to do as much as I can."    Currently in Pain? No/denies    Pain Score 0-No pain           Neuromuscular Reeducation to facilitate proper movement patterns following principles of LSVT BIG:  Performance ofLSVT Daily Session Maximal Daily Exercises: Sustained movements are designed to rescale the amplitude of movement output for generalization to daily functional activities. Performed as follows for 1 set of 10 repetitions each: Multi directional sustained movements- 1) Floor to ceiling, 2) Side to side. Multi directional Repetitive movements performed in standing and are designed to provide retraining effort  needed for sustained muscle activation in tasks Performed as follows: 3) Step and reach forward, 4) Step and Reach Backwards, 5) Step and reach sideways, 6) Rock and reach forward/backward, 7) Rock and reach sideways. Patient instructed on exercises inand adaptedversion with therapist providing minto min guard assist for balance and proper form and technique. Guiding with UEs to coordinate with lower extremity reciprocal movement patterns.    Ptcontinues tobenefit from slow instruction, repetition along with verbal and tactile cues. Continued focuson increasing arm movements and hands, cues for opening BIG during exercises. Short distance ambulation around the gym area with focus on turning behaviors, start and stop motions and reciprocal arm swing.   Functional component tasks, all performed 5 reps each and with therapist demo and cues. 1) sit to stand from lower surfacessupervision this date 2) reaching back with left hand to wipe after toileting 3) Handwriting pen strokes are small producing decreased legibility and micrographia. 4) reaching to the back of the head to comb hair 5)Reaching into left pocket of pants or coat 6) Manipulation of buttons, hand flicks performed prior to and during to assist with movements     Response to tx: Patient demonstrating difficulty with recalling exercises this date and required more mirroring than last week.  Therapist continues to provide some  guiding of motions for UE with extinguishing cues as patient demonstrates patterns.  Min assist to min guard for balance for tasks in standing.  Patient requires increased time for processing of information at times.  Responds well to demonstration and cues.  Continue to work towards improving calibration of movements, increasing BIG movements both with UE and LE.                       OT Education - 09/01/20 1853    Education Details LSVT BIG exercises, balance, functional component  tasks, reciprocal arm swing, hand flicks, safety    Person(s) Educated Patient;Spouse    Methods Explanation;Demonstration;Tactile cues    Comprehension Verbalized understanding;Returned demonstration;Need further instruction               OT Long Term Goals - 08/26/20 1442      OT LONG TERM GOAL #1   Title Patient will improve gait speed and endurance to be able to ambulate 900 feet in 6 minutes to negotiate around the home and community safely in 4 weeks.    Baseline 740 feet at eval, 10th visit 745 feet and had to rest up to 1 min during testing    Time 4    Period Weeks    Status On-going    Target Date 09/11/20      OT LONG TERM GOAL #2   Title Patient will complete HEP for maximal daily exercises with min assist in 4 weeks.    Baseline no current program at eval, 10th visit-becoming more familiar with HEP and with assist from wife    Time 4    Period Weeks    Status On-going    Target Date 09/11/20      OT LONG TERM GOAL #3   Title Patient will transfer from to sit to stand without the use of arms safely and independently from a variety of chairs/surfaces in 4 weeks.    Baseline difficulty with low surfaces, 10th visit: still has difficulty from lower surfaces such as couch    Time 4    Period Weeks    Status On-going    Target Date 09/11/20      OT LONG TERM GOAL #4   Title Will assess BERG balance next session    Baseline unable to perform at eval due to time constraints, patient has functional balance deficits during eval    Time 1    Period Weeks    Status Achieved      OT LONG TERM GOAL #5   Title Patient will complete light meal prep with supervision.    Baseline unable at eval, 10th visit:  continues to require assist from wife    Time 4    Period Weeks    Status On-going    Target Date 09/11/20      OT LONG TERM GOAL #6   Title Patient will demonstrate improvements in handwriting with legiblity greater than 80% for printing and signing name on  important papers.    Baseline 50% legiblity at eval, mircrographia noted. 10th visit:  improved to 60%    Time 4    Period Weeks    Status On-going    Target Date 09/11/20                 Plan - 09/01/20 1854    Clinical Impression Statement Patient demonstrating difficulty with recalling exercises this date and required more mirroring than last week.  Therapist continues to provide some guiding of motions for UE with extinguishing cues as patient demonstrates patterns.  Min assist to min guard for balance for tasks in standing.  Patient requires increased time for processing of information at times.  Responds well to demonstration and cues.  Continue to work towards improving calibration of movements, increasing BIG movements both with UE and LE.    OT Occupational Profile and History Detailed Assessment- Review of Records and additional review of physical, cognitive, psychosocial history related to current functional performance    Occupational performance deficits (Please refer to evaluation for details): ADL's;IADL's;Play;Leisure;Social Participation    Body Structure / Function / Physical Skills ADL;Dexterity;Flexibility;ROM;Strength;Balance;Coordination;FMC;IADL;Body mechanics;Gait;Endurance;UE functional use;Decreased knowledge of use of DME;GMC;Mobility    Cognitive Skills Attention;Memory;Sequencing    Psychosocial Skills Environmental  Adaptations;Habits;Routines and Behaviors    Rehab Potential Good    Clinical Decision Making Limited treatment options, no task modification necessary    Comorbidities Affecting Occupational Performance: Presence of comorbidities impacting occupational performance    Comorbidities impacting occupational performance description: dementia, chronic afib, h/o seizures, CVA, h/o back surgery/pain, HTN, anklyosing spondylitis    Modification or Assistance to Complete Evaluation  No modification of tasks or assist necessary to complete eval    OT  Frequency 4x / week    OT Duration 4 weeks    OT Treatment/Interventions Self-care/ADL training;Cryotherapy;Therapeutic exercise;DME and/or AE instruction;Functional Mobility Training;Cognitive remediation/compensation;Balance training;Neuromuscular education;Gait Training;Manual Therapy;Moist Heat;Therapeutic activities;Patient/family education;Stair Training    Consulted and Agree with Plan of Care Patient;Family member/caregiver    Family Member Consulted Natalia Leatherwood, wife           Patient will benefit from skilled therapeutic intervention in order to improve the following deficits and impairments:   Body Structure / Function / Physical Skills: ADL, Dexterity, Flexibility, ROM, Strength, Balance, Coordination, FMC, IADL, Body mechanics, Gait, Endurance, UE functional use, Decreased knowledge of use of DME, GMC, Mobility Cognitive Skills: Attention, Memory, Sequencing Psychosocial Skills: Environmental  Adaptations, Habits, Routines and Behaviors   Visit Diagnosis: Unsteadiness on feet  Muscle weakness (generalized)  Other lack of coordination  Difficulty in walking, not elsewhere classified  Abnormal posture    Problem List Patient Active Problem List   Diagnosis Date Noted  . Raynaud phenomenon 03/22/2020  . Chronic venous insufficiency 03/22/2020  . B12 deficiency 03/10/2020  . 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 Pseudophakia of left eye 07/02/2018  . Obesity, Class I, BMI 30-34.9 02/15/2018  . Closed displaced fracture of distal phalanx of left middle finger 03/08/2017  . Closed fracture of distal end of left radius with routine healing 01/16/2017  . Closed fracture of nasal bone with routine healing 01/12/2017  . 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  . Closed fracture of dorsal (thoracic) vertebra (HCC) 12/19/2016  . Fall 12/19/2016  . Fracture of left radius 12/19/2016  . Primary osteoarthritis of right knee 10/14/2015  .  Acute pain of right knee 09/09/2015  . Aftercare following joint replacement surgery 07/31/2015  . Closed nondisplaced fracture of lateral condyle of femur (HCC) 07/31/2015  . Bilateral edema of lower extremity 06/26/2014  . Chronic atrial fibrillation (HCC) 07/13/2013  . Wound disruption, post-op, skin 10/11/2012  . Cellulitis and abscess 10/05/2012  . Hyperlipidemia 08/17/2012  . Osteoarthritis 08/17/2012  . Seizure disorder (HCC) 08/16/2012  . Hip pain 12/12/2011  . 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 CAD (coronary artery disease) 07/07/2011  . Diabetes mellitus, type 2 (HCC) 07/07/2011  . HTN (hypertension) 07/07/2011  Kerrie Buffalo, OTR/L, CLT  Wali Reinheimer 09/01/2020, 7:07 PM  [place] [facility] MAIN [facility] SERVICES [address] [place], [place], [address] Phone: [phone]   Fax:  [phone]  Name: Ian King MRN: [id number] Date of Birth: 04-29-1941

## 2020-09-02 ENCOUNTER — Other Ambulatory Visit: Payer: Self-pay

## 2020-09-02 ENCOUNTER — Encounter: Payer: Self-pay | Admitting: Speech Pathology

## 2020-09-02 ENCOUNTER — Ambulatory Visit: Payer: Medicare PPO | Attending: Neurology | Admitting: Occupational Therapy

## 2020-09-02 ENCOUNTER — Ambulatory Visit: Payer: Medicare PPO | Admitting: Speech Pathology

## 2020-09-02 DIAGNOSIS — R262 Difficulty in walking, not elsewhere classified: Secondary | ICD-10-CM | POA: Insufficient documentation

## 2020-09-02 DIAGNOSIS — R2681 Unsteadiness on feet: Secondary | ICD-10-CM | POA: Insufficient documentation

## 2020-09-02 DIAGNOSIS — R293 Abnormal posture: Secondary | ICD-10-CM | POA: Insufficient documentation

## 2020-09-02 DIAGNOSIS — M6281 Muscle weakness (generalized): Secondary | ICD-10-CM | POA: Diagnosis present

## 2020-09-02 DIAGNOSIS — R41841 Cognitive communication deficit: Secondary | ICD-10-CM | POA: Diagnosis present

## 2020-09-02 DIAGNOSIS — R278 Other lack of coordination: Secondary | ICD-10-CM | POA: Diagnosis present

## 2020-09-02 NOTE — Therapy (Signed)
[place] [facility] MAIN [facility] SERVICES [address] [place], [place], [address] Phone: [phone]   Fax:  [phone]  Speech Language Pathology Treatment  Patient Details  Name: Ian King MRN: [id number] Date of Birth: 06-03-1941 Referring Provider (SLP): Dr. Sherryll Burger   Encounter Date: 09/01/2020   End of Session - 09/02/20 0901    Visit Number 8    Number of Visits 17    Date for SLP Re-Evaluation 10/02/20    Authorization Type Medicare    Authorization Time Period Start 08/05/2020    Authorization - Visit Number 8    Progress Note Due on Visit 10    SLP Start Time 1400    SLP Stop Time  1500    SLP Time Calculation (min) 60 min    Activity Tolerance Patient tolerated treatment well           Past Medical History:  Diagnosis Date  . A-fib (HCC)   . Diabetes mellitus without complication (HCC)   . Hypertension     Past Surgical History:  Procedure Laterality Date  . arm surgery    . BACK SURGERY    . JOINT REPLACEMENT     hip  . JOINT REPLACEMENT Right    knee  . SPINE SURGERY      There were no vitals filed for this visit.          ADULT SLP TREATMENT - 09/02/20 0001      General Information   Behavior/Cognition Alert;Cooperative;Pleasant mood;Confused;Requires cueing;Decreased sustained attention;Distractible;Doesn't follow directions    HPI Ian King is a 79 y.o. male with PMHx of Afib on Xarelto, HLD, former tobacco use, T2DM on metformin, CAD who presented to [facility] 03/08/2020 for evaluation of possible stroke. MRI revealed acute to subacute right ventricle stripe territory infarction, small bilateral acute foci of restricted diffusion at the convexities consistent with acute cortical infarctions and more chronic ischemic changes bilaterally in the posterior cerebral artery territories and less prominently in the middle cerebral artery territories. MOCA 03/09/2020: 27/30. 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        Treatment Provided   Treatment provided Cognitive-Linquistic      Pain Assessment   Pain Assessment No/denies pain      Cognitive-Linquistic Treatment   Treatment focused on Cognition;Patient/family/caregiver education;Voice      Assessment / Recommendations / Plan   Plan Continue with current plan of care      Progression Toward Goals   Progression toward goals Not progressing toward goals (comment)   severity of deficits               SLP Long Term Goals - 08/06/20 1307      SLP LONG TERM GOAL #1   Title Patient and his wife will participate in developing functional compensatory strategies to improve engagement and activity.    Time 8    Period Weeks    Status New    Target Date 10/02/20      SLP LONG TERM GOAL #2   Title Patient will demonstrate functional cognitive-communication skills for independent completion of personal responsibilities and leisure activities.  Time 8    Period Weeks    Status New    Target Date 10/02/20      SLP LONG TERM GOAL #3   Title Patient will generate grammatical and cogent sentence to complete simple/concrete linguistic task with 80% accuracy..    Time 8    Period Weeks    Status New    Target Date 10/02/20      SLP LONG TERM GOAL #4   Title Patient will complete semantic feature word finding tasks with 80% accuracy.    Time 8    Period Weeks    Status New    Target Date 10/02/20             Patient will benefit from skilled therapeutic intervention in order to improve the following  deficits and impairments:   Cognitive communication deficit    Problem List Patient Active Problem List   Diagnosis Date Noted  . Raynaud phenomenon 03/22/2020  . Chronic venous insufficiency 03/22/2020  . B12 deficiency 03/10/2020  . 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 Pseudophakia of left eye 07/02/2018  . Obesity, Class I, BMI 30-34.9 02/15/2018  . Closed displaced fracture of distal phalanx of left middle finger 03/08/2017  . Closed fracture of distal end of left radius with routine healing 01/16/2017  . Closed fracture of nasal bone with routine healing 01/12/2017  . 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  . Closed fracture of dorsal (thoracic) vertebra (HCC) 12/19/2016  . Fall 12/19/2016  . Fracture of left radius 12/19/2016  . Primary osteoarthritis of right knee 10/14/2015  . Acute pain of right knee 09/09/2015  . Aftercare following joint replacement surgery 07/31/2015  . Closed nondisplaced fracture of lateral condyle of femur (HCC) 07/31/2015  . Bilateral edema of lower extremity 06/26/2014  . Chronic atrial fibrillation (HCC) 07/13/2013  . Wound disruption, post-op, skin 10/11/2012  . Cellulitis and abscess 10/05/2012  . Hyperlipidemia 08/17/2012  . Osteoarthritis 08/17/2012  . Seizure disorder (HCC) 08/16/2012  . Hip pain 12/12/2011  . 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 CAD (coronary artery disease) 07/07/2011  . Diabetes mellitus, type 2 (HCC) 07/07/2011  . HTN (hypertension) 07/07/2011    Ian King 09/02/2020, 9:02 AM  [place] [facility] MAIN [facility] SERVICES [address] [place], [place], [address] Phone: [phone]   Fax:  [phone]   Name: CAEDEN FOOTS MRN: [id number] Date of Birth: [birth year]

## 2020-09-03 ENCOUNTER — Other Ambulatory Visit: Payer: Self-pay

## 2020-09-03 ENCOUNTER — Ambulatory Visit: Payer: Medicare PPO | Admitting: Occupational Therapy

## 2020-09-03 ENCOUNTER — Ambulatory Visit: Payer: Medicare PPO | Admitting: Speech Pathology

## 2020-09-03 DIAGNOSIS — R41841 Cognitive communication deficit: Secondary | ICD-10-CM

## 2020-09-03 DIAGNOSIS — R262 Difficulty in walking, not elsewhere classified: Secondary | ICD-10-CM

## 2020-09-03 DIAGNOSIS — R293 Abnormal posture: Secondary | ICD-10-CM

## 2020-09-03 DIAGNOSIS — M6281 Muscle weakness (generalized): Secondary | ICD-10-CM

## 2020-09-03 DIAGNOSIS — R278 Other lack of coordination: Secondary | ICD-10-CM

## 2020-09-03 DIAGNOSIS — R2681 Unsteadiness on feet: Secondary | ICD-10-CM | POA: Diagnosis not present

## 2020-09-04 ENCOUNTER — Encounter: Payer: Self-pay | Admitting: Speech Pathology

## 2020-09-04 NOTE — Therapy (Signed)
[place] [facility] MAIN [facility] SERVICES [address] [place], [place], [address] Phone: [phone]   Fax:  [phone]  Speech Language Pathology Treatment  Patient Details  Name: Ian King MRN: [id number] Date of Birth: November 06, 1940 Referring Provider (SLP): Dr. Sherryll Burger   Encounter Date: 09/03/2020   End of Session - 09/04/20 0851    Visit Number 9    Number of Visits 17    Date for SLP Re-Evaluation 10/02/20    Authorization Type Medicare    Authorization Time Period Start 08/05/2020    Authorization - Visit Number 9    Progress Note Due on Visit 10    SLP Start Time 1400    SLP Stop Time  1445    SLP Time Calculation (min) 45 min    Activity Tolerance Patient limited by fatigue           Past Medical History:  Diagnosis Date  . A-fib (HCC)   . Diabetes mellitus without complication (HCC)   . Hypertension     Past Surgical History:  Procedure Laterality Date  . arm surgery    . BACK SURGERY    . JOINT REPLACEMENT     hip  . JOINT REPLACEMENT Right    knee  . SPINE SURGERY      There were no vitals filed for this visit.   Subjective Assessment - 09/04/20 0849    Subjective pt very fatigued    Patient is accompained by: Family member    Currently in Pain? No/denies                 ADULT SLP TREATMENT - 09/04/20 0001      General Information   Behavior/Cognition Alert;Cooperative;Pleasant mood;Confused;Requires cueing;Decreased sustained attention;Distractible;Doesn't follow directions    HPI Ian King is a 79 y.o. male with PMHx of Afib on Xarelto, HLD, former tobacco use, T2DM on metformin, CAD who presented to [facility] 03/08/2020 for evaluation of possible stroke. MRI revealed acute to subacute right ventricle stripe territory infarction, small bilateral acute foci of restricted diffusion at the convexities consistent with acute cortical infarctions and more chronic ischemic changes bilaterally in the posterior cerebral  artery territories and less prominently in the middle cerebral artery territories. MOCA 03/09/2020: 27/30. 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        Treatment Provided   Treatment provided Cognitive-Linquistic      Pain Assessment   Pain Assessment No/denies pain      Cognitive-Linquistic Treatment   Treatment focused on Cognition;Patient/family/caregiver education;Voice    Skilled Treatment maximal assistance to follow 1 step direction within known activity      Assessment / Recommendations / Plan   Plan Continue with current plan of care      Progression Toward Goals   Progression toward goals Not progressing toward goals (comment)   d/t severity of cognitive deficits           SLP Education - 09/04/20 0851    Education Details decreased ability for new learning    Person(s) Educated Patient;Spouse    Methods Explanation;Demonstration;Tactile cues;Verbal cues    Comprehension Need further instruction  SLP Long Term Goals - 08/06/20 1307      SLP LONG TERM GOAL #1   Title Patient and his wife will participate in developing functional compensatory strategies to improve engagement and activity.    Time 8    Period Weeks    Status New    Target Date 10/02/20      SLP LONG TERM GOAL #2   Title Patient will demonstrate functional cognitive-communication skills for independent completion of personal responsibilities and leisure activities.    Time 8    Period Weeks    Status New    Target Date 10/02/20      SLP LONG TERM GOAL #3    Title Patient will generate grammatical and cogent sentence to complete simple/concrete linguistic task with 80% accuracy..    Time 8    Period Weeks    Status New    Target Date 10/02/20      SLP LONG TERM GOAL #4   Title Patient will complete semantic feature word finding tasks with 80% accuracy.    Time 8    Period Weeks    Status New    Target Date 10/02/20            Plan - 09/04/20 6948    Clinical Impression Statement Skilled treatment session focused on pt's functional cognition, specifically his ability to focus/sustain attention to known task and follow basic 1 step directions within known task. SLP facilitated session by presenting a jigsaw puzzle (puzzles are a task that he does with his sitters). SLP identified 2 pieces of puzzle but pt was not able to join when placed side by side and with hand over hand SLP assistance. After SLP joined pieces, pt was instructed to "push down" on the pieces to ensure a good fit. Pt required total assistance and he frequently felt the pieces didn't go together. After ~ 25 minutes, pt required maximal cues to maintain alertness as he began falling asleep. Suspect his fatigue level is a result of OT session (BIG) prior to this session. Pt's wife unable to identify any barriers that SLP could provide solutions or strategies to reduce the barrier.    Speech Therapy Frequency 2x / week    Duration 12 weeks    Treatment/Interventions Cognitive reorganization;Compensatory techniques;SLP instruction and feedback;Patient/family education    Potential to Achieve Goals Good    Potential Considerations Ability to learn/carryover information;Previous level of function;Co-morbidities;Severity of impairments;Cooperation/participation level;Medical prognosis;Family/community support    SLP Home Exercise Plan continue promoting pt independence within safe activities    Consulted and Agree with Plan of Care Patient;Family member/caregiver    Family Member  Consulted Spouse           Patient will benefit from skilled therapeutic intervention in order to improve the following deficits and impairments:   Cognitive communication deficit    Problem List Patient Active Problem List   Diagnosis Date Noted  . Raynaud phenomenon 03/22/2020  . Chronic venous insufficiency 03/22/2020  . B12 deficiency 03/10/2020  . 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 Pseudophakia of left eye 07/02/2018  . Obesity, Class I, BMI 30-34.9 02/15/2018  . Closed displaced fracture of distal phalanx of left middle finger 03/08/2017  . Closed fracture of distal end of left radius with routine healing 01/16/2017  . Closed fracture of nasal bone with routine healing 01/12/2017  . 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  . Closed fracture of dorsal (thoracic) vertebra (HCC) 12/19/2016  . Fall 12/19/2016  .  Fracture of left radius 12/19/2016  . Primary osteoarthritis of right knee 10/14/2015  . Acute pain of right knee 09/09/2015  . Aftercare following joint replacement surgery 07/31/2015  . Closed nondisplaced fracture of lateral condyle of femur (HCC) 07/31/2015  . Bilateral edema of lower extremity 06/26/2014  . Chronic atrial fibrillation (HCC) 07/13/2013  . Wound disruption, post-op, skin 10/11/2012  . Cellulitis and abscess 10/05/2012  . Hyperlipidemia 08/17/2012  . Osteoarthritis 08/17/2012  . Seizure disorder (HCC) 08/16/2012  . Hip pain 12/12/2011  . 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 CAD (coronary artery disease) 07/07/2011  . Diabetes mellitus, type 2 (HCC) 07/07/2011  . HTN (hypertension) 07/07/2011   Ian King B. Dreama Saa M.S., CCC-SLP, [facility] Speech-Language Pathologist Rehabilitation Services Office [phone]  Reuel Derby 09/04/2020, 9:00 AM  [place] [facility] MAIN [facility] SERVICES [address] [place], [place], [address] Phone: [phone]   Fax:  [phone]   Name:  Ian King MRN: [id number] Date of Birth: May 16, 1941

## 2020-09-05 ENCOUNTER — Encounter: Payer: Self-pay | Admitting: Occupational Therapy

## 2020-09-05 NOTE — Therapy (Signed)
[place] MAIN [facility] SERVICES [address] [place], [place], [address] Phone: [phone]   Fax:  [phone]  Occupational Therapy Treatment  Patient Details  Name: Ian King CODE MRN: [id number] Date of Birth: [birth year] No data recorded  Encounter Date: 09/02/2020   OT End of Session - 09/05/20 1431    Visit Number 14    Number of Visits 17    Date for OT Re-Evaluation 09/11/20    OT Start Time 1300    OT Stop Time 1357    OT Time Calculation (min) 57 min    Activity Tolerance Patient tolerated treatment well    Behavior During Therapy [facility] for tasks assessed/performed           Past Medical History:  Diagnosis Date  . A-fib ([place])   . Diabetes mellitus without complication ([place])   . Hypertension     Past Surgical History:  Procedure Laterality Date  . arm surgery    . BACK SURGERY    . JOINT REPLACEMENT     hip  . JOINT REPLACEMENT Right    knee  . SPINE SURGERY      There were no vitals filed for this visit.   Subjective Assessment - 09/05/20 1429    Subjective  Pt reports he is doing well, reports doing one set of exercises this morning with aide at home.    Patient is accompanied by: Family member    Patient Stated Goals Patient reports he would like to be as independent as he can for himself.  "I want to do as much as I can."    Currently in Pain? No/denies    Pain Score 0-No pain          Neuromuscular Reeducation to facilitate proper movement patterns following principles of LSVT BIG:  Performance ofLSVT Daily Session Maximal Daily Exercises: Sustained movements are designed to rescale the amplitude of movement output for generalization to daily functional activities. Performed as follows for 1 set of 10 repetitions each: Multi directional sustained movements- 1) Floor to ceiling, 2) Side to side. Multi directional Repetitive movements performed in standing and are designed to provide retraining effort needed  for sustained muscle activation in tasks Performed as follows: 3) Step and reach forward, 4) Step and Reach Backwards, 5) Step and reach sideways, 6) Rock and reach forward/backward, 7) Rock and reach sideways. Min guard for balance and use of chair in adapted version.  Therapistguiding with UEs to coordinate with lower extremity reciprocal movement patterns.  Slow instruction, repetition along with verbal and tactile cues. Continued focuson increasing arm movements and hands, cues for opening BIG during exercises, hand flicks with select tasks. Short distance ambulation around the gym area with focus on turning behaviors, start and stop motions and reciprocal arm swing.   Functional component tasks, all performed 5 reps each and with therapist demo and cues. 1) sit to stand from lower surfacessupervision this date 2) reaching back with left hand to wipe after toileting 3) Handwriting pen strokes are small producing decreased legibility and micrographia. 4) reaching to the back of the head to comb hair 5)Reaching into left pocket of pants or coat 6) Manipulation of buttons, hand flicks performed prior to and during to assist with movements     Response to tx: Patient continues to progress with exercises, decreased assist for balance from therapist but still requires increased verbal cues and some guiding with UE arm motions during exercises to help establish pattern  and calibration of movements.  Continues to require cues to take larger steps with exercises and to increase amplitude when picking up and placing foot onto surface.  Buttons have been difficult this week even with use of hand flicks prior to performing.  Patient did not complain of any pain today.  Continue to work towards goals in plan of care to maximize safety and independence in necessary daily tasks.                       OT Education - 09/05/20 1430    Education provided Yes    Education  Details LSVT BIG exercises, balance, functional component tasks, reciprocal arm swing, hand flicks, safety    Person(s) Educated Patient;Spouse    Methods Explanation;Demonstration;Tactile cues    Comprehension Verbalized understanding;Returned demonstration;Need further instruction            OT Short Term Goals - 04/07/17 1555      OT SHORT TERM GOAL #1   Title Pt to be ind in HEP to increase AROM in digits and wrist - splint wearing for [place]/wrist     Status Achieved      OT SHORT TERM GOAL #2   Title AROM in L 3rd digit improve for pt to touch palm without extention lag more than -10 degrees      Baseline extention lag -15 degrees     Status Partially Met      OT SHORT TERM GOAL #3   Title Pain on PRWHE improve with 15 points     Baseline at eval pain on PRWHE 26/50- pain now less than 5/50     Status Achieved      OT SHORT TERM GOAL #4   Title L wrist AROM improve for pt turn page, take change in palm, eat 50% with L hand     Status Achieved             OT Long Term Goals - 08/26/20 1442      OT LONG TERM GOAL #1   Title Patient will improve gait speed and endurance to be able to ambulate 900 feet in 6 minutes to negotiate around the home and community safely in 4 weeks.    Baseline 740 feet at eval, 10th visit 745 feet and had to rest up to 1 min during testing    Time 4    Period Weeks    Status On-going    Target Date 09/11/20      OT LONG TERM GOAL #2   Title Patient will complete HEP for maximal daily exercises with min assist in 4 weeks.    Baseline no current program at eval, 10th visit-becoming more familiar with HEP and with assist from wife    Time 4    Period Weeks    Status On-going    Target Date 09/11/20      OT LONG TERM GOAL #3   Title Patient will transfer from to sit to stand without the use of arms safely and independently from a variety of chairs/surfaces in 4 weeks.    Baseline difficulty with low surfaces, 10th visit: still has  difficulty from lower surfaces such as couch    Time 4    Period Weeks    Status On-going    Target Date 09/11/20      OT LONG TERM GOAL #4   Title Will assess BERG balance next session    Baseline unable to perform  at eval due to time constraints, patient has functional balance deficits during eval    Time 1    Period Weeks    Status Achieved      OT LONG TERM GOAL #5   Title Patient will complete light meal prep with supervision.    Baseline unable at eval, 10th visit:  continues to require assist from wife    Time 4    Period Weeks    Status On-going    Target Date 09/11/20      OT LONG TERM GOAL #6   Title Patient will demonstrate improvements in handwriting with legiblity greater than 80% for printing and signing name on important papers.    Baseline 50% legiblity at eval, mircrographia noted. 10th visit:  improved to 60%    Time 4    Period Weeks    Status On-going    Target Date 09/11/20                 Plan - 09/05/20 1431    Clinical Impression Statement Patient continues to progress with exercises, decreased assist for balance from therapist but still requires increased verbal cues and some guiding with UE arm motions during exercises to help establish pattern and calibration of movements.  Continues to require cues to take larger steps with exercises and to increase amplitude when picking up and placing foot onto surface.  Buttons have been difficult this week even with use of hand flicks prior to performing.  Patient did not complain of any pain today.  Continue to work towards goals in plan of care to maximize safety and independence in necessary daily tasks.    OT Occupational Profile and History Detailed Assessment- Review of Records and additional review of physical, cognitive, psychosocial history related to current functional performance    Occupational performance deficits (Please refer to evaluation for details): ADL's;IADL's;Play;Leisure;Social  Participation    Body Structure / Function / Physical Skills ADL;Dexterity;Flexibility;ROM;Strength;Balance;Coordination;FMC;IADL;Body mechanics;Gait;Endurance;UE functional use;Decreased knowledge of use of DME;GMC;Mobility    Cognitive Skills Attention;Memory;Sequencing    Psychosocial Skills Environmental  Adaptations;Habits;Routines and Behaviors    Rehab Potential Good    Clinical Decision Making Limited treatment options, no task modification necessary    Comorbidities Affecting Occupational Performance: Presence of comorbidities impacting occupational performance    Comorbidities impacting occupational performance description: dementia, chronic afib, h/o seizures, CVA, h/o back surgery/pain, HTN, anklyosing spondylitis    Modification or Assistance to Complete Evaluation  No modification of tasks or assist necessary to complete eval    OT Frequency 4x / week    OT Duration 4 weeks    OT Treatment/Interventions Self-care/ADL training;Cryotherapy;Therapeutic exercise;DME and/or AE instruction;Functional Mobility Training;Cognitive remediation/compensation;Balance training;Neuromuscular education;Gait Training;Manual Therapy;Moist Heat;Therapeutic activities;Patient/family education;Stair Training    Consulted and Agree with Plan of Care Patient;Family member/caregiver    Family Member Consulted Belenda Cruise, wife           Patient will benefit from skilled therapeutic intervention in order to improve the following deficits and impairments:   Body Structure / Function / Physical Skills: ADL, Dexterity, Flexibility, ROM, Strength, Balance, Coordination, FMC, IADL, Body mechanics, Gait, Endurance, UE functional use, Decreased knowledge of use of DME, GMC, Mobility Cognitive Skills: Attention, Memory, Sequencing Psychosocial Skills: Environmental  Adaptations, Habits, Routines and Behaviors   Visit Diagnosis: Unsteadiness on feet  Muscle weakness (generalized)  Other lack of  coordination  Difficulty in walking, not elsewhere classified  Abnormal posture    Problem List Patient Active Problem List   Diagnosis  Date Noted  . Raynaud phenomenon 03/22/2020  . Chronic venous insufficiency 03/22/2020  . B12 deficiency 03/10/2020  . Acute ischemic multifocal anterior circulation stroke ([place]) 03/09/2020  . Pseudophakia of left eye 07/02/2018  . Obesity, Class I, BMI 30-34.9 02/15/2018  . Closed displaced fracture of distal phalanx of left middle finger 03/08/2017  . Closed fracture of distal end of left radius with routine healing 01/16/2017  . Closed fracture of nasal bone with routine healing 01/12/2017  . Ankylosing spondylitis ([place]) 12/19/2016  . Closed fracture of dorsal (thoracic) vertebra ([place]) 12/19/2016  . Fall 12/19/2016  . Fracture of left radius 12/19/2016  . Primary osteoarthritis of right knee 10/14/2015  . Acute pain of right knee 09/09/2015  . Aftercare following joint replacement surgery 07/31/2015  . Closed nondisplaced fracture of lateral condyle of femur ([place]) 07/31/2015  . Bilateral edema of lower extremity 06/26/2014  . Chronic atrial fibrillation ([place]) 07/13/2013  . Wound disruption, post-op, skin 10/11/2012  . Cellulitis and abscess 10/05/2012  . Hyperlipidemia 08/17/2012  . Osteoarthritis 08/17/2012  . Seizure disorder ([place]) 08/16/2012  . Hip pain 12/12/2011  . 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 CAD (coronary artery disease) 07/07/2011  . Diabetes mellitus, type 2 ([place]) 07/07/2011  . HTN (hypertension) 07/07/2011    T Tomasita Morrow, OTR/L, CLT  , 09/05/2020, 2:39 PM  [place] MAIN [facility] SERVICES [address] [place], [place], [address] Phone: [phone]   Fax:  [phone]  Name: ANDRUW BATTIE MRN: [id number] Date of Birth: 1941/09/05

## 2020-09-05 NOTE — Therapy (Signed)
[place] [facility] MAIN [facility] SERVICES [address] [place], [place], [address] Phone: [phone]   Fax:  [phone]  Occupational Therapy Treatment  Patient Details  Name: Ian King MRN: [id number] Date of Birth: [birth year] No data recorded  Encounter Date: 09/03/2020   OT End of Session - 09/05/20 1610    Visit Number 15    Number of Visits 17    Date for OT Re-Evaluation 09/11/20    OT Start Time 1300    OT Stop Time 1358    OT Time Calculation (min) 58 min    Activity Tolerance Patient tolerated treatment well    Behavior During Therapy [facility] for tasks assessed/performed           Past Medical History:  Diagnosis Date  . A-fib ([facility])   . Diabetes mellitus without complication (H[facility])   . Hypertension     Past Surgical History:  Procedure Laterality Date  . arm surgery    . BACK SURGERY    . JOINT REPLACEMENT     hip  . JOINT REPLACEMENT Right    knee  . SPINE SURGERY      There were no vitals filed for this visit.   Subjective Assessment - 09/05/20 1608    Subjective  Pt smiling and ready to get started today.  "Do I have Speech today too?"    Patient Stated Goals Patient reports he would like to be as independent as he can for himself.  "I want to do as much as I can."    Currently in Pain? No/denies    Pain Score 0-No pain           Neuromuscular Reeducation to facilitate proper movement patterns following principles of LSVT BIG:  Performance ofLSVT Daily Session Maximal Daily Exercises: Sustained movements are designed to rescale the amplitude of movement output for generalization to daily functional activities. Performed as follows for 1 set of 10 repetitions each: Multi directional sustained movements- 1) Floor to ceiling, 2) Side to side. Multi directional Repetitive movements performed in standing and are designed to provide retraining effort needed for sustained muscle activation in tasks Performed as follows: 3)  Step and reach forward, 4) Step and Reach Backwards, 5) Step and reach sideways, 6) Rock and reach forward/backward, 7) Rock and reach sideways. Min guard for balance and use of chair in adapted version.  Continued therapistguiding with UEs to coordinate with lower extremity reciprocal movement patterns.  Slow instruction, repetition along with verbal and tactile cues. Continued focuson increasing arm movements and hands, cues for opening BIG during exercises, hand flicks with select tasks. Short distance ambulation to and from the gym area with focus on turning behaviors, start and stop motions and reciprocal arm swing.  Functional component tasks, all performed 5 reps each and with therapist demo and cues. 1) sit to stand from lower surfacessupervision this date 2) reaching back with left hand to wipe after toileting 3) Handwriting pen strokes are small producing decreased legibility and micrographia. 4) reaching to the back of the head to comb hair 5)Reaching into left pocket of pants or coat 6) Manipulation of buttons, hand flicks performed prior to and during to assist with movements    Response to tx: Patient continues to require assist with home exercises, is familiar with the exercises, but still requires cues for sequence, form and technique.  He requires increased time for processing information especially with directionality.  Continued difficulty with buttons this week, able  to perform better with white regular buttons than metal buttons.  Responds to cues for posture but remains limited secondary to history of back issues and past surgeries.  Flexibility has improved overall with exercises.  Patient continues to perform exercises in adapted version secondary to decreased balance but no longer requires min assist.  Therapist currently providing  Min guard and some guiding for UEs to establish pattern of movement and timing.  Continue to work towards goals in plan of care  to maximize safety and independence in ADL and IADL tasks at home and in the community.                       OT Education - 09/05/20 1608    Education provided Yes    Education Details LSVT BIG exercises, balance, functional component tasks, reciprocal arm swing, hand flicks, safety    Person(s) Educated Patient;Spouse    Methods Explanation;Demonstration;Tactile cues    Comprehension Verbalized understanding;Returned demonstration;Need further instruction               OT Long Term Goals - 08/26/20 1442      OT LONG TERM GOAL #1   Title Patient will improve gait speed and endurance to be able to ambulate 900 feet in 6 minutes to negotiate around the home and community safely in 4 weeks.    Baseline 740 feet at eval, 10th visit 745 feet and had to rest up to 1 min during testing    Time 4    Period Weeks    Status On-going    Target Date 09/11/20      OT LONG TERM GOAL #2   Title Patient will complete HEP for maximal daily exercises with min assist in 4 weeks.    Baseline no current program at eval, 10th visit-becoming more familiar with HEP and with assist from wife    Time 4    Period Weeks    Status On-going    Target Date 09/11/20      OT LONG TERM GOAL #3   Title Patient will transfer from to sit to stand without the use of arms safely and independently from a variety of chairs/surfaces in 4 weeks.    Baseline difficulty with low surfaces, 10th visit: still has difficulty from lower surfaces such as couch    Time 4    Period Weeks    Status On-going    Target Date 09/11/20      OT LONG TERM GOAL #4   Title Will assess BERG balance next session    Baseline unable to perform at eval due to time constraints, patient has functional balance deficits during eval    Time 1    Period Weeks    Status Achieved      OT LONG TERM GOAL #5   Title Patient will complete light meal prep with supervision.    Baseline unable at eval, 10th visit:  continues to  require assist from wife    Time 4    Period Weeks    Status On-going    Target Date 09/11/20      OT LONG TERM GOAL #6   Title Patient will demonstrate improvements in handwriting with legiblity greater than 80% for printing and signing name on important papers.    Baseline 50% legiblity at eval, mircrographia noted. 10th visit:  improved to 60%    Time 4    Period Weeks    Status On-going  Target Date 09/11/20                 Plan - 09/05/20 1611    Clinical Impression Statement Patient continues to require assist with home exercises, is familiar with the exercises, but still requires cues for sequence, form and technique.  He requires increased time for processing information especially with directionality.  Continued difficulty with buttons this week, able to perform better with white regular buttons than metal buttons.  Responds to cues for posture but remains limited secondary to history of back issues and past surgeries.  Flexibility has improved overall with exercises.  Patient continues to perform exercises in adapted version secondary to decreased balance but no longer requires min assist.  Therapist currently providing  Min guard and some guiding for UEs to establish pattern of movement and timing.  Continue to work towards goals in plan of care to maximize safety and independence in ADL and IADL tasks at home and in the community.    OT Occupational Profile and History Detailed Assessment- Review of Records and additional review of physical, cognitive, psychosocial history related to current functional performance    Occupational performance deficits (Please refer to evaluation for details): ADL's;IADL's;Play;Leisure;Social Participation    Body Structure / Function / Physical Skills ADL;Dexterity;Flexibility;ROM;Strength;Balance;Coordination;FMC;IADL;Body mechanics;Gait;Endurance;UE functional use;Decreased knowledge of use of DME;GMC;Mobility    Cognitive Skills  Attention;Memory;Sequencing    Psychosocial Skills Environmental  Adaptations;Habits;Routines and Behaviors    Rehab Potential Good    Clinical Decision Making Limited treatment options, no task modification necessary    Comorbidities Affecting Occupational Performance: Presence of comorbidities impacting occupational performance    Comorbidities impacting occupational performance description: dementia, chronic afib, h/o seizures, CVA, h/o back surgery/pain, HTN, anklyosing spondylitis    Modification or Assistance to Complete Evaluation  No modification of tasks or assist necessary to complete eval    OT Frequency 4x / week    OT Duration 4 weeks    OT Treatment/Interventions Self-care/ADL training;Cryotherapy;Therapeutic exercise;DME and/or AE instruction;Functional Mobility Training;Cognitive remediation/compensation;Balance training;Neuromuscular education;Gait Training;Manual Therapy;Moist Heat;Therapeutic activities;Patient/family education;Stair Training    Consulted and Agree with Plan of Care Patient;Family member/caregiver    Family Member Consulted Natalia LeatherwoodKatherine, wife           Patient will benefit from skilled therapeutic intervention in order to improve the following deficits and impairments:   Body Structure / Function / Physical Skills: ADL, Dexterity, Flexibility, ROM, Strength, Balance, Coordination, FMC, IADL, Body mechanics, Gait, Endurance, UE functional use, Decreased knowledge of use of DME, GMC, Mobility Cognitive Skills: Attention, Memory, Sequencing Psychosocial Skills: Environmental  Adaptations, Habits, Routines and Behaviors   Visit Diagnosis: Unsteadiness on feet  Difficulty in walking, not elsewhere classified  Abnormal posture  Cognitive communication deficit  Muscle weakness (generalized)  Other lack of coordination    Problem List Patient Active Problem List   Diagnosis Date Noted  . Raynaud phenomenon 03/22/2020  . Chronic venous insufficiency  03/22/2020  . B12 deficiency 03/10/2020  . 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 Pseudophakia of left eye 07/02/2018  . Obesity, Class I, BMI 30-34.9 02/15/2018  . Closed displaced fracture of distal phalanx of left middle finger 03/08/2017  . Closed fracture of distal end of left radius with routine healing 01/16/2017  . Closed fracture of nasal bone with routine healing 01/12/2017  . 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  . Closed fracture of dorsal (thoracic) vertebra (HCC) 12/19/2016  . Fall 12/19/2016  . Fracture of left radius 12/19/2016  .  Primary osteoarthritis of right knee 10/14/2015  . Acute pain of right knee 09/09/2015  . Aftercare following joint replacement surgery 07/31/2015  . Closed nondisplaced fracture of lateral condyle of femur (HCC) 07/31/2015  . Bilateral edema of lower extremity 06/26/2014  . Chronic atrial fibrillation (HCC) 07/13/2013  . Wound disruption, post-op, skin 10/11/2012  . Cellulitis and abscess 10/05/2012  . Hyperlipidemia 08/17/2012  . Osteoarthritis 08/17/2012  . Seizure disorder (HCC) 08/16/2012  . Hip pain 12/12/2011  . 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 CAD (coronary artery disease) 07/07/2011  . Diabetes mellitus, type 2 (HCC) 07/07/2011  . HTN (hypertension) 07/07/2011   Euel Castile T Arne Cleveland, OTR/L, CLT  Marcas Bowsher 09/05/2020, 4:30 PM  [place] [facility] MAIN [facility] SERVICES [address] [place], [place], [address] Phone: [phone]   Fax:  [phone]  Name: Ian King MRN: [id number] Date of Birth: 11/18/1940

## 2020-09-07 ENCOUNTER — Ambulatory Visit: Payer: Medicare PPO | Admitting: Occupational Therapy

## 2020-09-07 ENCOUNTER — Ambulatory Visit: Payer: Medicare PPO | Admitting: Speech Pathology

## 2020-09-07 ENCOUNTER — Other Ambulatory Visit: Payer: Self-pay

## 2020-09-07 DIAGNOSIS — R278 Other lack of coordination: Secondary | ICD-10-CM

## 2020-09-07 DIAGNOSIS — R262 Difficulty in walking, not elsewhere classified: Secondary | ICD-10-CM

## 2020-09-07 DIAGNOSIS — M6281 Muscle weakness (generalized): Secondary | ICD-10-CM

## 2020-09-07 DIAGNOSIS — R2681 Unsteadiness on feet: Secondary | ICD-10-CM | POA: Diagnosis not present

## 2020-09-07 DIAGNOSIS — R41841 Cognitive communication deficit: Secondary | ICD-10-CM

## 2020-09-07 DIAGNOSIS — R293 Abnormal posture: Secondary | ICD-10-CM

## 2020-09-08 ENCOUNTER — Ambulatory Visit: Payer: Medicare PPO | Admitting: Occupational Therapy

## 2020-09-08 ENCOUNTER — Other Ambulatory Visit: Payer: Self-pay

## 2020-09-08 ENCOUNTER — Encounter: Payer: Medicare Other | Admitting: Speech Pathology

## 2020-09-08 DIAGNOSIS — R41841 Cognitive communication deficit: Secondary | ICD-10-CM

## 2020-09-08 DIAGNOSIS — R278 Other lack of coordination: Secondary | ICD-10-CM

## 2020-09-08 DIAGNOSIS — R293 Abnormal posture: Secondary | ICD-10-CM

## 2020-09-08 DIAGNOSIS — M6281 Muscle weakness (generalized): Secondary | ICD-10-CM

## 2020-09-08 DIAGNOSIS — R2681 Unsteadiness on feet: Secondary | ICD-10-CM

## 2020-09-08 DIAGNOSIS — R262 Difficulty in walking, not elsewhere classified: Secondary | ICD-10-CM

## 2020-09-09 NOTE — Therapy (Signed)
[place] MAIN [facility] SERVICES [address] [place], [place], [address] Phone: [phone]   Fax:  [phone]  Occupational Therapy Treatment  Patient Details  Name: Ian King MRN: [id number] Date of Birth: Feb 07, 1941 No data recorded  Encounter Date: 09/07/2020   OT End of Session - 09/09/20 1646    Visit Number 16    Number of Visits 17    Date for OT Re-Evaluation 09/11/20    OT Start Time 1259    OT Stop Time 1400    OT Time Calculation (min) 61 min    Activity Tolerance Patient tolerated treatment well    Behavior During Therapy [facility] for tasks assessed/performed           Past Medical History:  Diagnosis Date  . A-fib ([place])   . Diabetes mellitus without complication ([place])   . Hypertension     Past Surgical History:  Procedure Laterality Date  . arm surgery    . BACK SURGERY    . JOINT REPLACEMENT     hip  . JOINT REPLACEMENT Right    knee  . SPINE SURGERY      There were no vitals filed for this visit.   Subjective Assessment - 09/09/20 1645    Subjective  "what are you doing here?  This is my first time being here."  Patient confused this date and disoriented to place.  Recognizes therapist as being familiar but unable to associate role of therapist in this setting    Patient is accompanied by: Family member    Patient Stated Goals Patient reports he would like to be as independent as he can for himself.  "I want to do as much as I can."    Currently in Pain? No/denies    Pain Score 0-No pain           Neuromuscular Reeducation to facilitate proper movement patterns following principles of LSVT BIG:  Performance ofLSVT Daily Session Maximal Daily Exercises: Sustained movements are designed to rescale the amplitude of movement output for generalization to daily functional activities. Performed as follows for 1 set of 10 repetitions each: Multi directional sustained movements- 1) Floor to ceiling, 2) Side to  side. Multi directional Repetitive movements performed in standing and are designed to provide retraining effort needed for sustained muscle activation in tasks Performed as follows: 3) Step and reach forward, 4) Step and Reach Backwards, 5) Step and reach sideways, 6) Rock and reach forward/backward, 7) Rock and reach sideways. Min assist for balance and use of chair in adapted version. Continued therapistguiding with UEs to coordinate with lower extremity reciprocal movement patterns. Patient confused this date and required min assist for balance with standing exercises and moderate to max cues for majority of session, towards end of session patient was able to recognize and demonstrate some familiarity with exercises.  Slow instruction, repetition along with verbal and tactile cues. Continued focuson increasing arm movements and hands, cues for opening BIG during exercises, hand flicks with select tasks. Short distance ambulation to and from the gym area with focus on turning behaviors, start and stop motions and arm swing, therapist walking with patient holding one hand and guiding with arm swing.    Functional component tasks, all performed 5 reps each and with therapist demo and cues. 1) sit to stand from lower surfacessupervision this date 2) reaching back with left hand to wipe after toileting 3) Handwriting pen strokes are small producing decreased legibility and micrographia.  4) reaching to the back of the head to comb hair 5)Reaching into left pocket of pants or coat 6) Manipulation of buttons, hand flicks performed prior to and during to assist with movements    Response to tx: Patient demonstrating extreme confusion this date during session.  He recognized therapist as someone he is familiar with however could not state therapist name and could not associate the therapist's role in this setting initially today.  Once he started with exercises, half way through session  it appeared as if he woke up and things were familiar again and he was able to follow along with less cues and assist.  Patient requiring increased assist for all tasks this date secondary to confusion and mental status.  He continued to respond well to cues but required increased cuing and processing time.  Will plan to perform reassessment next session with outcome measures and determine further course of care.                     OT Education - 09/09/20 1646    Education provided Yes    Education Details LSVT BIG exercises, balance, functional component tasks, reciprocal arm swing, hand flicks, safety    Person(s) Educated Patient;Spouse    Methods Explanation;Demonstration;Tactile cues    Comprehension Verbalized understanding;Returned demonstration;Need further instruction            OT Short Term Goals - 04/07/17 1555      OT SHORT TERM GOAL #1   Title Pt to be ind in HEP to increase AROM in digits and wrist - splint wearing for [place]/wrist     Status Achieved      OT SHORT TERM GOAL #2   Title AROM in L 3rd digit improve for pt to touch palm without extention lag more than -10 degrees      Baseline extention lag -15 degrees     Status Partially Met      OT SHORT TERM GOAL #3   Title Pain on PRWHE improve with 15 points     Baseline at eval pain on PRWHE 26/50- pain now less than 5/50     Status Achieved      OT SHORT TERM GOAL #4   Title L wrist AROM improve for pt turn page, take change in palm, eat 50% with L hand     Status Achieved             OT Long Term Goals - 08/26/20 1442      OT LONG TERM GOAL #1   Title Patient will improve gait speed and endurance to be able to ambulate 900 feet in 6 minutes to negotiate around the home and community safely in 4 weeks.    Baseline 740 feet at eval, 10th visit 745 feet and had to rest up to 1 min during testing    Time 4    Period Weeks    Status On-going    Target Date 09/11/20      OT LONG TERM  GOAL #2   Title Patient will complete HEP for maximal daily exercises with min assist in 4 weeks.    Baseline no current program at eval, 10th visit-becoming more familiar with HEP and with assist from wife    Time 4    Period Weeks    Status On-going    Target Date 09/11/20      OT LONG TERM GOAL #3   Title Patient will transfer from to  sit to stand without the use of arms safely and independently from a variety of chairs/surfaces in 4 weeks.    Baseline difficulty with low surfaces, 10th visit: still has difficulty from lower surfaces such as couch    Time 4    Period Weeks    Status On-going    Target Date 09/11/20      OT LONG TERM GOAL #4   Title Will assess BERG balance next session    Baseline unable to perform at eval due to time constraints, patient has functional balance deficits during eval    Time 1    Period Weeks    Status Achieved      OT LONG TERM GOAL #5   Title Patient will complete light meal prep with supervision.    Baseline unable at eval, 10th visit:  continues to require assist from wife    Time 4    Period Weeks    Status On-going    Target Date 09/11/20      OT LONG TERM GOAL #6   Title Patient will demonstrate improvements in handwriting with legiblity greater than 80% for printing and signing name on important papers.    Baseline 50% legiblity at eval, mircrographia noted. 10th visit:  improved to 60%    Time 4    Period Weeks    Status On-going    Target Date 09/11/20                 Plan - 09/09/20 1647    Clinical Impression Statement Patient demonstrating extreme confusion this date during session.  He recognized therapist as someone he is familiar with however could not state therapist name and could not associate the therapist's role in this setting initially today.  Once he started with exercises, half way through session it appeared as if he woke up and things were familiar again and he was able to follow along with less cues and  assist.  Patient requiring increased assist for all tasks this date secondary to confusion and mental status.  He continued to respond well to cues but required increased cuing and processing time.  Will plan to perform reassessment next session with outcome measures and determine further course of care.    OT Occupational Profile and History Detailed Assessment- Review of Records and additional review of physical, cognitive, psychosocial history related to current functional performance    Occupational performance deficits (Please refer to evaluation for details): ADL's;IADL's;Play;Leisure;Social Participation    Body Structure / Function / Physical Skills ADL;Dexterity;Flexibility;ROM;Strength;Balance;Coordination;FMC;IADL;Body mechanics;Gait;Endurance;UE functional use;Decreased knowledge of use of DME;GMC;Mobility    Cognitive Skills Attention;Memory;Sequencing    Psychosocial Skills Environmental  Adaptations;Habits;Routines and Behaviors    Rehab Potential Good    Clinical Decision Making Limited treatment options, no task modification necessary    Comorbidities Affecting Occupational Performance: Presence of comorbidities impacting occupational performance    Comorbidities impacting occupational performance description: dementia, chronic afib, h/o seizures, CVA, h/o back surgery/pain, HTN, anklyosing spondylitis    Modification or Assistance to Complete Evaluation  No modification of tasks or assist necessary to complete eval    OT Frequency 4x / week    OT Duration 4 weeks    OT Treatment/Interventions Self-care/ADL training;Cryotherapy;Therapeutic exercise;DME and/or AE instruction;Functional Mobility Training;Cognitive remediation/compensation;Balance training;Neuromuscular education;Gait Training;Manual Therapy;Moist Heat;Therapeutic activities;Patient/family education;Stair Training    Consulted and Agree with Plan of Care Patient;Family member/caregiver    Family Member Consulted  Belenda Cruise, wife  Patient will benefit from skilled therapeutic intervention in order to improve the following deficits and impairments:   Body Structure / Function / Physical Skills: ADL,Dexterity,Flexibility,ROM,Strength,Balance,Coordination,FMC,IADL,Body mechanics,Gait,Endurance,UE functional use,Decreased knowledge of use of DME,GMC,Mobility Cognitive Skills: Attention,Memory,Sequencing Psychosocial Skills: Environmental  Adaptations,Habits,Routines and Behaviors   Visit Diagnosis: Unsteadiness on feet  Difficulty in walking, not elsewhere classified  Abnormal posture  Muscle weakness (generalized)  Other lack of coordination  Cognitive communication deficit    Problem List Patient Active Problem List   Diagnosis Date Noted  . Raynaud phenomenon 03/22/2020  . Chronic venous insufficiency 03/22/2020  . B12 deficiency 03/10/2020  . Acute ischemic multifocal anterior circulation stroke ([place]) 03/09/2020  . Pseudophakia of left eye 07/02/2018  . Obesity, Class I, BMI 30-34.9 02/15/2018  . Closed displaced fracture of distal phalanx of left middle finger 03/08/2017  . Closed fracture of distal end of left radius with routine healing 01/16/2017  . Closed fracture of nasal bone with routine healing 01/12/2017  . Ankylosing spondylitis ([place]) 12/19/2016  . Closed fracture of dorsal (thoracic) vertebra ([place]) 12/19/2016  . Fall 12/19/2016  . Fracture of left radius 12/19/2016  . Primary osteoarthritis of right knee 10/14/2015  . Acute pain of right knee 09/09/2015  . Aftercare following joint replacement surgery 07/31/2015  . Closed nondisplaced fracture of lateral condyle of femur ([place]) 07/31/2015  . Bilateral edema of lower extremity 06/26/2014  . Chronic atrial fibrillation ([place]) 07/13/2013  . Wound disruption, post-op, skin 10/11/2012  . Cellulitis and abscess 10/05/2012  . Hyperlipidemia 08/17/2012  . Osteoarthritis 08/17/2012  . Seizure disorder ([place])  08/16/2012  . Hip pain 12/12/2011  . 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 CAD (coronary artery disease) 07/07/2011  . Diabetes mellitus, type 2 ([place]) 07/07/2011  . HTN (hypertension) 07/07/2011   Tiyona Desouza T Tomasita Morrow, OTR/L, CLT  [facility] 09/10/2020, 9:56 AM  [place] [facility] SERVICES [address] [place], [place], [address] Phone: [phone]   Fax:  [phone]  Name: Ian King MRN: [id number] Date of Birth: 1940/12/08

## 2020-09-10 ENCOUNTER — Encounter: Payer: Self-pay | Admitting: Occupational Therapy

## 2020-09-10 NOTE — Therapy (Signed)
[place] MAIN [facility] SERVICES [address] [place], [place], [address] Phone: [phone]   Fax:  [phone]  Occupational Therapy Treatment/Recertification   Patient Details  Name: Ian King MRN: [id number] Date of Birth: November 06, 1940 No data recorded  Encounter Date: 09/08/2020   OT End of Session - 09/14/20 0901    Visit Number 17    Number of Visits 29    Date for OT Re-Evaluation 10/23/20    OT Start Time 1300    OT Stop Time 1400    OT Time Calculation (min) 60 min    Activity Tolerance Patient tolerated treatment well    Behavior During Therapy [facility] for tasks assessed/performed           Past Medical History:  Diagnosis Date  . A-fib ([place])   . Diabetes mellitus without complication ([place])   . Hypertension     Past Surgical History:  Procedure Laterality Date  . arm surgery    . BACK SURGERY    . JOINT REPLACEMENT     hip  . JOINT REPLACEMENT Right    knee  . SPINE SURGERY      There were no vitals filed for this visit.   Subjective Assessment - 09/14/20 0900    Subjective  Pt reports he is feeling good today and ready to work a bit, decreased confusion and is aware of who therapist is and why he is here.  Pt's caregiver/aide has continued to work with patient on exercises at home.    Patient is accompanied by: Family member    Patient Stated Goals Patient reports he would like to be as independent as he can for himself.  "I want to do as much as I can."           Neuromuscular Reeducation to facilitate proper movement patterns following principles of LSVT BIG:  Performance ofLSVT Daily Session Maximal Daily Exercises: Sustained movements are designed to rescale the amplitude of movement output for generalization to daily functional activities. Performed as follows for 1 set of 10 repetitions each: Multi directional sustained movements- 1) Floor to ceiling, 2) Side to side. Multi directional Repetitive  movements performed in standing and are designed to provide retraining effort needed for sustained muscle activation in tasks Performed as follows: 3) Step and reach forward, 4) Step and Reach Backwards, 5) Step and reach sideways, 6) Rock and reach forward/backward, 7) Rock and reach sideways. Min assist for balance and use of chair in adapted version.Continued therapistguiding with UEs to coordinate with lower extremity reciprocal movement patterns.  Continue with slow instruction, repetition along with verbal and tactile cues. Continued focuson increasing arm movements and hands, cues for opening BIG during exercises, hand flicks with select tasks. Short distance ambulationto and fromthe gym area with focus on turning behaviors, start and stop motions and arm swing, therapist walking with patient holding one hand and guiding with arm swing.    Functional component tasks, all performed 5 reps each and with therapist demo and cues. 1) sit to stand from lower surfacessupervision this date 2) reaching back with left hand to wipe after toileting 3) Handwriting pen strokes are small producing decreased legibility and micrographia. 4) reaching to the back of the head to comb hair 5)Reaching into left pocket of pants or coat 6) Manipulation of buttons, hand flicks performed prior to and during to assist with movements  5 times sit to stand 12 secs 6 minute walk test 760 feet BERG  balance test 36/56  Response to tx: Patient performed well for intensive portion of LSVT BIG, he demonstrated progress in balance, functional mobility, performance with home program and with coordination skills.  Despite dementia, he was able to show ability to perform home program with assist from wife and/or caregiver and is able to perform most days with min assist and in an adapted version.   Some days he requires increased cues and demonstration for exercises.  With increased focus on maximal daily  exercises during most sessions, he still would benefit from additional focus on functional mobility skills and balance.  He also still has difficulty with buttons when dressing but has shown some improvements with use of hand flicks prior to task.  Patient did have episode of confusion one day this week and did not recall coming for therapy before but recognized therapist and after 1/2 into the session he was able to recall select exercises.  Patient would benefit from continued skilled OT services to work towards current and new goals to maximize safety and independence in necessary daily tasks.  Wife also benefits from recommendations from OT perspective with approach to dementia and patient daily roles and routines at home.                       OT Education - 09/14/20 0901    Education provided Yes    Education Details exercises, goals, plan of care, fine motor coordination skills    Person(s) Educated Patient;Spouse    Methods Explanation;Demonstration;Tactile cues    Comprehension Verbalized understanding;Returned demonstration;Need further instruction               OT Long Term Goals - 09/14/20 0902      OT LONG TERM GOAL #1   Title Patient will improve gait speed and endurance to be able to ambulate 900 feet in 6 minutes to negotiate around the home and community safely in 4 weeks.    Baseline 740 feet at eval, 10th visit 745 feet and had to rest up to 1 min during testing.  17th visit 760 feet    Time 4    Period Weeks    Status On-going    Target Date 10/23/20      OT LONG TERM GOAL #2   Title Patient will complete HEP for maximal daily exercises with min assist in 4 weeks.    Baseline no current program at eval, 10th visit-becoming more familiar with HEP and with assist from wife, 17th min assist but requires moderate cues at times    Time 4    Period Weeks    Status On-going    Target Date 10/23/20      OT LONG TERM GOAL #3   Title Patient will transfer  from to sit to stand without the use of arms safely and independently from a variety of chairs/surfaces in 4 weeks.    Baseline difficulty with low surfaces, 10th visit: still has difficulty from lower surfaces such as couch    Time 4    Period Weeks    Status Achieved      OT LONG TERM GOAL #4   Title Will assess BERG balance next session    Baseline unable to perform at eval due to time constraints, patient has functional balance deficits during eval    Time 1    Period Weeks    Status Achieved      OT LONG TERM GOAL #5   Title  Patient will complete light meal prep with supervision.    Baseline unable at eval, 10th visit:  continues to require assist from wife    Time 4    Period Weeks    Status Not Met    Target Date 10/23/20      OT LONG TERM GOAL #6   Title Patient will demonstrate improvements in handwriting with legiblity greater than 80% for printing and signing name on important papers.    Baseline 50% legiblity at eval, mircrographia noted. 10th visit:  improved to 70%    Time 4    Period Weeks    Status Partially Met    Target Date 10/23/20      OT LONG TERM GOAL #7   Title Pt will improve fine motor coordination skills to manage buttons on clothing with modified independence.    Baseline difficulty with buttons and requires moderate assist.    Time 6    Period Weeks    Status New    Target Date 10/23/20                 Plan - 09/14/20 0902    Clinical Impression Statement Patient performed well for intensive portion of LSVT BIG, he demonstrated progress in balance, functional mobility, performance with home program and with coordination skills.  Despite dementia, he was able to show ability to perform home program with assist from wife and/or caregiver and is able to perform most days with min assist and in an adapted version.   Some days he requires increased cues and demonstration for exercises.  With increased focus on maximal daily exercises during most  sessions, he still would benefit from additional focus on functional mobility skills and balance.  He also still has difficulty with buttons when dressing but has shown some improvements with use of hand flicks prior to task.  Patient did have episode of confusion one day this week and did not recall coming for therapy before but recognized therapist and after 1/2 into the session he was able to recall select exercises.  Patient would benefit from continued skilled OT services to work towards current and new goals to maximize safety and independence in necessary daily tasks.  Wife also benefits from recommendations from OT perspective with approach to dementia and patient daily roles and routines at home.    OT Occupational Profile and History Detailed Assessment- Review of Records and additional review of physical, cognitive, psychosocial history related to current functional performance    Occupational performance deficits (Please refer to evaluation for details): ADL's;IADL's;Play;Leisure;Social Participation    Body Structure / Function / Physical Skills ADL;Dexterity;Flexibility;ROM;Strength;Balance;Coordination;FMC;IADL;Body mechanics;Gait;Endurance;UE functional use;Decreased knowledge of use of DME;GMC;Mobility    Cognitive Skills Attention;Memory;Sequencing    Psychosocial Skills Environmental  Adaptations;Habits;Routines and Behaviors    Rehab Potential Good    Clinical Decision Making Limited treatment options, no task modification necessary    Comorbidities Affecting Occupational Performance: Presence of comorbidities impacting occupational performance    Comorbidities impacting occupational performance description: dementia, chronic afib, h/o seizures, CVA, h/o back surgery/pain, HTN, anklyosing spondylitis    Modification or Assistance to Complete Evaluation  No modification of tasks or assist necessary to complete eval    OT Frequency 2x / week    OT Duration 6 weeks    OT  Treatment/Interventions Self-care/ADL training;Cryotherapy;Therapeutic exercise;DME and/or AE instruction;Functional Mobility Training;Cognitive remediation/compensation;Balance training;Neuromuscular education;Gait Training;Manual Therapy;Moist Heat;Therapeutic activities;Patient/family education;Stair Training    Consulted and Agree with Plan of Care Patient;Family member/caregiver  Family Member Consulted Belenda Cruise, wife           Patient will benefit from skilled therapeutic intervention in order to improve the following deficits and impairments:   Body Structure / Function / Physical Skills: ADL,Dexterity,Flexibility,ROM,Strength,Balance,Coordination,FMC,IADL,Body mechanics,Gait,Endurance,UE functional use,Decreased knowledge of use of DME,GMC,Mobility Cognitive Skills: Attention,Memory,Sequencing Psychosocial Skills: Environmental  Adaptations,Habits,Routines and Behaviors   Visit Diagnosis: Unsteadiness on feet  Difficulty in walking, not elsewhere classified  Abnormal posture  Muscle weakness (generalized)  Other lack of coordination  Cognitive communication deficit    Problem List Patient Active Problem List   Diagnosis Date Noted  . Raynaud phenomenon 03/22/2020  . Chronic venous insufficiency 03/22/2020  . B12 deficiency 03/10/2020  . Acute ischemic multifocal anterior circulation stroke ([place]) 03/09/2020  . Pseudophakia of left eye 07/02/2018  . Obesity, Class I, BMI 30-34.9 02/15/2018  . Closed displaced fracture of distal phalanx of left middle finger 03/08/2017  . Closed fracture of distal end of left radius with routine healing 01/16/2017  . Closed fracture of nasal bone with routine healing 01/12/2017  . Ankylosing spondylitis ([place]) 12/19/2016  . Closed fracture of dorsal (thoracic) vertebra ([place]) 12/19/2016  . Fall 12/19/2016  . Fracture of left radius 12/19/2016  . Primary osteoarthritis of right knee 10/14/2015  . Acute pain of right knee 09/09/2015   . Aftercare following joint replacement surgery 07/31/2015  . Closed nondisplaced fracture of lateral condyle of femur ([place]) 07/31/2015  . Bilateral edema of lower extremity 06/26/2014  . Chronic atrial fibrillation ([place]) 07/13/2013  . Wound disruption, post-op, skin 10/11/2012  . Cellulitis and abscess 10/05/2012  . Hyperlipidemia 08/17/2012  . Osteoarthritis 08/17/2012  . Seizure disorder ([place]) 08/16/2012  . Hip pain 12/12/2011  . 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 CAD (coronary artery disease) 07/07/2011  . Diabetes mellitus, type 2 ([place]) 07/07/2011  . HTN (hypertension) 07/07/2011   Zonnique Norkus T Tomasita Morrow, OTR/L, CLT  Merida Alcantar 09/14/2020, 9:20 AM  [place] MAIN [facility] SERVICES [address] [place], [place], [address] Phone: [phone]   Fax:  [phone]  Name: Ian King MRN: [id number] Date of Birth: 11/10/1940

## 2020-09-14 ENCOUNTER — Ambulatory Visit: Payer: Medicare PPO | Admitting: Occupational Therapy

## 2020-09-14 ENCOUNTER — Other Ambulatory Visit: Payer: Self-pay

## 2020-09-14 DIAGNOSIS — R293 Abnormal posture: Secondary | ICD-10-CM

## 2020-09-14 DIAGNOSIS — R2681 Unsteadiness on feet: Secondary | ICD-10-CM | POA: Diagnosis not present

## 2020-09-14 DIAGNOSIS — M6281 Muscle weakness (generalized): Secondary | ICD-10-CM

## 2020-09-14 DIAGNOSIS — R278 Other lack of coordination: Secondary | ICD-10-CM

## 2020-09-14 DIAGNOSIS — R262 Difficulty in walking, not elsewhere classified: Secondary | ICD-10-CM

## 2020-09-14 DIAGNOSIS — R41841 Cognitive communication deficit: Secondary | ICD-10-CM

## 2020-09-16 ENCOUNTER — Ambulatory Visit: Payer: Medicare PPO | Admitting: Occupational Therapy

## 2020-09-16 ENCOUNTER — Ambulatory Visit: Payer: Medicare PPO | Admitting: Speech Pathology

## 2020-09-16 ENCOUNTER — Other Ambulatory Visit: Payer: Self-pay

## 2020-09-16 DIAGNOSIS — R2681 Unsteadiness on feet: Secondary | ICD-10-CM

## 2020-09-16 DIAGNOSIS — R262 Difficulty in walking, not elsewhere classified: Secondary | ICD-10-CM

## 2020-09-16 DIAGNOSIS — R41841 Cognitive communication deficit: Secondary | ICD-10-CM

## 2020-09-16 DIAGNOSIS — R278 Other lack of coordination: Secondary | ICD-10-CM

## 2020-09-16 DIAGNOSIS — M6281 Muscle weakness (generalized): Secondary | ICD-10-CM

## 2020-09-16 DIAGNOSIS — R293 Abnormal posture: Secondary | ICD-10-CM

## 2020-09-17 ENCOUNTER — Encounter: Payer: Self-pay | Admitting: Occupational Therapy

## 2020-09-17 NOTE — Therapy (Signed)
[place] MAIN [facility] SERVICES [address] [place], [place], [address] Phone: [phone]   Fax:  [phone]  Speech Language Pathology Treatment DISCHARGE SUMMARY  Patient Details  Name: Ian King MRN: [id number] Date of Birth: [birth year] Referring Provider (SLP): Dr. Manuella King   Encounter Date: 09/16/2020   End of Session - 09/17/20 2215    Visit Number 10    Number of Visits 17    Date for SLP Re-Evaluation 10/02/20    Authorization Type Medicare    Authorization Time Period Start 08/05/2020    Authorization - Visit Number 10    Progress Note Due on Visit 10    SLP Start Time 1400    SLP Stop Time  1500    SLP Time Calculation (min) 60 min    Activity Tolerance Patient limited by fatigue           Past Medical History:  Diagnosis Date  . A-fib ([place])   . Diabetes mellitus without complication ([place])   . Hypertension     Past Surgical History:  Procedure Laterality Date  . arm surgery    . BACK SURGERY    . JOINT REPLACEMENT     hip  . JOINT REPLACEMENT Right    knee  . SPINE SURGERY      There were no vitals filed for this visit.   Subjective Assessment - 09/17/20 1727    Subjective pt pleasant but fatigued    Patient is accompained by: Family member    Currently in Pain? No/denies                 ADULT SLP TREATMENT - 09/17/20 1727      General Information   Behavior/Cognition Alert;Cooperative;Pleasant mood;Confused;Requires cueing;Decreased sustained attention;Distractible;Doesn't follow directions    HPI [name] is a 79 y.o. male with PMHx of Afib on Xarelto, HLD, former tobacco use, T2DM on metformin, CAD who presented to [facility] 03/08/2020 for evaluation of possible stroke. MRI revealed acute to subacute right ventricle stripe territory infarction, small bilateral acute foci of restricted diffusion at the convexities consistent with acute cortical infarctions and more chronic ischemic changes bilaterally  in the posterior cerebral artery territories and less prominently in the middle cerebral artery territories. [place] 03/09/2020: 27/30. 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        Treatment Provided   Treatment provided Cognitive-Linquistic      Pain Assessment   Pain Assessment No/denies pain      Cognitive-Linquistic Treatment   Treatment focused on Cognition;Patient/family/caregiver education;Voice    Skilled Treatment More ideas shared with pt, his wife and his caregiver such as creating a memory book or a busy apron, pt's wife brought in several photos pt had difficulty identifying several family members such as thinking his father and mother were his sister and brother, he had difficulty identifying his daughter      Assessment / Recommendations / Plan   Plan Continue with current plan of care      Progression Toward Goals   Progression toward goals Progressing toward goals  SLP Education - 09/17/20 2214    Education Details recommend getting an ID bracelet for pt to wear    Person(s) Educated Patient;Spouse;Caregiver(s)    Methods Explanation;Demonstration;Verbal cues    Comprehension --   pt's wife and caregiver voiced understanding             SLP Long Term Goals - 09/17/20 2223      SLP LONG TERM GOAL #1   Title Patient and his wife will participate in developing functional compensatory strategies to improve engagement and activity.    Status Achieved      SLP LONG TERM GOAL #2   Title Patient will demonstrate functional  cognitive-communication skills for independent completion of personal responsibilities and leisure activities.    Status Not Met - d/t progression of dementia     SLP LONG TERM GOAL #3   Title Patient will generate grammatical and cogent sentence to complete simple/concrete linguistic task with 80% accuracy..    Status Not Met - d/t progression of dementia     SLP LONG TERM GOAL #4   Title Patient will complete semantic feature word finding tasks with 80% accuracy.    Status Not Met - d/t progression of dementia           Plan - 09/17/20 2215    Clinical Impression Statement More strategies and activities shared with pt, his wife and caregiver. Unfortunately, pt continues to experience a decline in cognition. As such, he has difficulty completing tasks with maximal multi-modal support. Pt occasionally had a wet vocal quality. Education provided on pt's increased aspiration risk and risk of malnutrition with his increased confusion. Pt's wife stated that she halved one of his pills to make it easier to swallow. However, pt refused to take as he "doesn't take any halved pills." At this time, wife and caregiver are providing for all of patients needs. All questions answered to everyone's satisfaction. Pt is appropriate for discharge from skilled ST services.    Consulted and Agree with Plan of Care Patient;Family member/caregiver;Other (Comment)    Family Member Consulted Spouse and caregiver            Problem List Patient Active Problem List   Diagnosis Date Noted  . Raynaud phenomenon 03/22/2020  . Chronic venous insufficiency 03/22/2020  . B12 deficiency 03/10/2020  . Acute ischemic multifocal anterior circulation stroke ([place]) 03/09/2020  . Pseudophakia of left eye 07/02/2018  . Obesity, Class I, BMI 30-34.9 02/15/2018  . Closed displaced fracture of distal phalanx of left middle finger 03/08/2017  . Closed fracture of distal end of left radius with routine healing 01/16/2017  .  Closed fracture of nasal bone with routine healing 01/12/2017  . Ankylosing spondylitis ([place]) 12/19/2016  . Closed fracture of dorsal (thoracic) vertebra ([place]) 12/19/2016  . Fall 12/19/2016  . Fracture of left radius 12/19/2016  . Primary osteoarthritis of right knee 10/14/2015  . Acute pain of right knee 09/09/2015  . Aftercare following joint replacement surgery 07/31/2015  . Closed nondisplaced fracture of lateral condyle of femur ([place]) 07/31/2015  . Bilateral edema of lower extremity 06/26/2014  . Chronic atrial fibrillation ([place]) 07/13/2013  . Wound disruption, post-op, skin 10/11/2012  . Cellulitis and abscess 10/05/2012  . Hyperlipidemia 08/17/2012  . Osteoarthritis 08/17/2012  . Seizure disorder ([place]) 08/16/2012  . Hip pain 12/12/2011  . 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 CAD (coronary artery disease) 07/07/2011  . Diabetes mellitus, type 2 ([place]) 07/07/2011  . HTN (hypertension) 07/07/2011  Jannetta Massey B. Rutherford Nail M.S., CCC-SLP, [place] Pathologist Rehabilitation Services Office [phone]  Stormy Fabian 09/17/2020, 10:26 PM  [place] MAIN [facility] SERVICES [address] [place], [place], [address] Phone: [phone]   Fax:  [phone]   Name: Ian King MRN: [id number] Date of Birth: 08-Mar-1941

## 2020-09-17 NOTE — Therapy (Signed)
[place] MAIN [facility] SERVICES [address] [place], [place], [address] Phone: [phone]   Fax:  [phone]  Occupational Therapy Treatment  Patient Details  Name: Ian King MRN: [id number] Date of Birth: [birth year] No data recorded  Encounter Date: 09/14/2020   OT End of Session - 09/17/20 2115    Visit Number 18    Number of Visits 29    Date for OT Re-Evaluation 10/23/20    OT Start Time 1400    OT Stop Time 1446    OT Time Calculation (min) 46 min    Activity Tolerance Patient tolerated treatment well    Behavior During Therapy [facility] for tasks assessed/performed           Past Medical History:  Diagnosis Date  . A-fib ([place])   . Diabetes mellitus without complication ([place])   . Hypertension     Past Surgical History:  Procedure Laterality Date  . arm surgery    . BACK SURGERY    . JOINT REPLACEMENT     hip  . JOINT REPLACEMENT Right    knee  . SPINE SURGERY      There were no vitals filed for this visit.   Subjective Assessment - 09/17/20 2112    Subjective  Wife reports things have gotten more difficult at home.  They were going out and she was driving and the patient attempted to get out of the car while it was in motion.  She stopped the car and was able to flag down a deputy who then assisted them.  Reports patient would not get back in the car with her and would only ride with the deputy back to his house.  Once he was home he acted OK and did not recall incident.    Patient Stated Goals Patient reports he would like to be as independent as he can for himself.  "I want to do as much as I can."    Currently in Pain? No/denies    Pain Score 0-No pain          Pt seen this date for ROM and strengthening exercises with 2# dowel for shoulder flexion, ABD/ADD, chest press, forwards and backwards circles and diagonal patterns.  Responds well to mirroring from therapist and cues for proper form and technique.    Reaching tasks with SAEBO tower with looped balls to promote increased reaching patterns with left UE, able to perform 3 of 4 levels  Neuro:  Fine motor coordination skills to pick up coins from tabletop to place into resistive bank with cues for prehension patterns.  Instructed on translatory skills of the hand to move items to palm and then back to fingertips to place into container.  Occasional cues for prehension patterns.    Functional mobility to and from the clinic with straight cane and min guard.    Response to tx: Pt with increased episodes of confusion over the past couple weeks, transient in nature and patient has no recollection of events occuring.  Wife concerned about patient's behavior and is unsure if she will be able to continue to take him out to appts.  Patient able to recall therapist by name and demonstrated select recall of exercises in the clinic.  Continues to require assist with exercises and with balance at times.  Cues this date for sit to stand and weight shift forwards.  Use of cane for functional mobility in and around the clinic.  Wife to talk  to their kids and decide on future plans and will discuss next session.                      OT Education - 09/17/20 2114    Education provided Yes    Education Details safety, cues, exercises, mobility    Person(s) Educated Patient;Spouse    Methods Explanation;Demonstration;Tactile cues    Comprehension Verbalized understanding;Returned demonstration;Need further instruction               OT Long Term Goals - 09/14/20 0902      OT LONG TERM GOAL #1   Title Patient will improve gait speed and endurance to be able to ambulate 900 feet in 6 minutes to negotiate around the home and community safely in 4 weeks.    Baseline 740 feet at eval, 10th visit 745 feet and had to rest up to 1 min during testing.  17th visit 760 feet    Time 4    Period Weeks    Status On-going    Target Date 10/23/20       OT LONG TERM GOAL #2   Title Patient will complete HEP for maximal daily exercises with min assist in 4 weeks.    Baseline no current program at eval, 10th visit-becoming more familiar with HEP and with assist from wife, 17th min assist but requires moderate cues at times    Time 4    Period Weeks    Status On-going    Target Date 10/23/20      OT LONG TERM GOAL #3   Title Patient will transfer from to sit to stand without the use of arms safely and independently from a variety of chairs/surfaces in 4 weeks.    Baseline difficulty with low surfaces, 10th visit: still has difficulty from lower surfaces such as couch    Time 4    Period Weeks    Status Achieved      OT LONG TERM GOAL #4   Title Will assess BERG balance next session    Baseline unable to perform at eval due to time constraints, patient has functional balance deficits during eval    Time 1    Period Weeks    Status Achieved      OT LONG TERM GOAL #5   Title Patient will complete light meal prep with supervision.    Baseline unable at eval, 10th visit:  continues to require assist from wife    Time 4    Period Weeks    Status Not Met    Target Date 10/23/20      OT LONG TERM GOAL #6   Title Patient will demonstrate improvements in handwriting with legiblity greater than 80% for printing and signing name on important papers.    Baseline 50% legiblity at eval, mircrographia noted. 10th visit:  improved to 70%    Time 4    Period Weeks    Status Partially Met    Target Date 10/23/20      OT LONG TERM GOAL #7   Title Pt will improve fine motor coordination skills to manage buttons on clothing with modified independence.    Baseline difficulty with buttons and requires moderate assist.    Time 6    Period Weeks    Status New    Target Date 10/23/20                 Plan - 09/17/20 2115  Clinical Impression Statement Pt with increased episodes of confusion over the past couple weeks, transient in nature  and patient has no recollection of events occuring.  Wife concerned about patient's behavior and is unsure if she will be able to continue to take him out to appts.  Patient able to recall therapist by name and demonstrated select recall of exercises in the clinic.  Continues to require assist with exercises and with balance at times.  Cues this date for sit to stand and weight shift forwards.  Use of cane for functional mobility in and around the clinic.  Wife to talk to their kids and decide on future plans and will discuss next session.    OT Occupational Profile and History Detailed Assessment- Review of Records and additional review of physical, cognitive, psychosocial history related to current functional performance    Occupational performance deficits (Please refer to evaluation for details): ADL's;IADL's;Play;Leisure;Social Participation    Body Structure / Function / Physical Skills ADL;Dexterity;Flexibility;ROM;Strength;Balance;Coordination;FMC;IADL;Body mechanics;Gait;Endurance;UE functional use;Decreased knowledge of use of DME;GMC;Mobility    Cognitive Skills Attention;Memory;Sequencing    Psychosocial Skills Environmental  Adaptations;Habits;Routines and Behaviors    Rehab Potential Good    Clinical Decision Making Limited treatment options, no task modification necessary    Comorbidities Affecting Occupational Performance: Presence of comorbidities impacting occupational performance    Comorbidities impacting occupational performance description: dementia, chronic afib, h/o seizures, CVA, h/o back surgery/pain, HTN, anklyosing spondylitis    Modification or Assistance to Complete Evaluation  No modification of tasks or assist necessary to complete eval    OT Frequency 2x / week    OT Duration 6 weeks    OT Treatment/Interventions Self-care/ADL training;Cryotherapy;Therapeutic exercise;DME and/or AE instruction;Functional Mobility Training;Cognitive remediation/compensation;Balance  training;Neuromuscular education;Gait Training;Manual Therapy;Moist Heat;Therapeutic activities;Patient/family education;Stair Training    Consulted and Agree with Plan of Care Patient;Family member/caregiver    Family Member Consulted Belenda Cruise, wife           Patient will benefit from skilled therapeutic intervention in order to improve the following deficits and impairments:   Body Structure / Function / Physical Skills: ADL,Dexterity,Flexibility,ROM,Strength,Balance,Coordination,FMC,IADL,Body mechanics,Gait,Endurance,UE functional use,Decreased knowledge of use of DME,GMC,Mobility Cognitive Skills: Attention,Memory,Sequencing Psychosocial Skills: Environmental  Adaptations,Habits,Routines and Behaviors   Visit Diagnosis: Difficulty in walking, not elsewhere classified  Unsteadiness on feet  Other lack of coordination  Abnormal posture  Muscle weakness (generalized)  Cognitive communication deficit    Problem List Patient Active Problem List   Diagnosis Date Noted  . Raynaud phenomenon 03/22/2020  . Chronic venous insufficiency 03/22/2020  . B12 deficiency 03/10/2020  . Acute ischemic multifocal anterior circulation stroke ([place]) 03/09/2020  . Pseudophakia of left eye 07/02/2018  . Obesity, Class I, BMI 30-34.9 02/15/2018  . Closed displaced fracture of distal phalanx of left middle finger 03/08/2017  . Closed fracture of distal end of left radius with routine healing 01/16/2017  . Closed fracture of nasal bone with routine healing 01/12/2017  . Ankylosing spondylitis ([place]) 12/19/2016  . Closed fracture of dorsal (thoracic) vertebra ([place]) 12/19/2016  . Fall 12/19/2016  . Fracture of left radius 12/19/2016  . Primary osteoarthritis of right knee 10/14/2015  . Acute pain of right knee 09/09/2015  . Aftercare following joint replacement surgery 07/31/2015  . Closed nondisplaced fracture of lateral condyle of femur ([place]) 07/31/2015  . Bilateral edema of lower extremity  06/26/2014  . Chronic atrial fibrillation ([place]) 07/13/2013  . Wound disruption, post-op, skin 10/11/2012  . Cellulitis and abscess 10/05/2012  . Hyperlipidemia 08/17/2012  . Osteoarthritis 08/17/2012  .  Seizure disorder (H[facility]) 08/16/2012  . Hip pain 12/12/2011  . 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 CAD (coronary artery disease) 07/07/2011  . Diabetes mellitus, type 2 (H[facility]) 07/07/2011  . HTN (hypertension) 07/07/2011    T , OTR/L, CLT  , 09/17/2020, 9:20 PM  [facility] MA[facility] REHAB SERVICES [address] [place], [place], [address] Phone: [phone]   Fax:  [phone]  Name: Ian King MRN: [id number] Date of Birth: 02/26/1941 

## 2020-09-18 ENCOUNTER — Encounter: Payer: Self-pay | Admitting: Occupational Therapy

## 2020-09-18 NOTE — Therapy (Signed)
[place] MAIN [facility] SERVICES [address] [place], [place], [address] Phone: [phone]   Fax:  [phone]  Occupational Therapy Treatment  Patient Details  Name: Ian King MRN: [id number] Date of Birth: Dec 12, 1940 No data recorded  Encounter Date: 09/16/2020   OT End of Session - 09/18/20 1930    Visit Number 19    Number of Visits 29    Date for OT Re-Evaluation 10/23/20    OT Start Time 1309    OT Stop Time 1355    OT Time Calculation (min) 46 min    Activity Tolerance Patient tolerated treatment well    Behavior During Therapy [facility] for tasks assessed/performed           Past Medical History:  Diagnosis Date  . A-fib ([place])   . Diabetes mellitus without complication ([place])   . Hypertension     Past Surgical History:  Procedure Laterality Date  . arm surgery    . BACK SURGERY    . JOINT REPLACEMENT     hip  . JOINT REPLACEMENT Right    knee  . SPINE SURGERY      There were no vitals filed for this visit.   Subjective Assessment - 09/18/20 1929    Subjective  Wife reports patient had  a quick "turn" of confusion on the way to therapy and they had a hard time getting patient out of the car. Pt extremely confused this date and rigid with movements.  Reports increase in seroquel medication.    Patient Stated Goals Patient reports he would like to be as independent as he can for himself.  "I want to do as much as I can."    Currently in Pain? No/denies    Pain Score 0-No pain           Patient seen for attempts at LSVT BIG exercises, max difficulty this date and appears resistive and rigid today.   Patient able to perform ankle pumps bilaterally with max cues, knee extension bilaterally for 10 reps, sit to stand with max assist this date. Floor to ceiling exercise with reaching and spreading arms wide, therapist demo and cues.  Balloon volley from seated position, patient more engaged with this automatic task and  required decreased cues, encouraged active reaching patterns in all directions with bilateral UEs.   Functional mobility with cane for short distances around room, 25 feet for 4 trials with min guard and cues.         Response to tx: Patient confused this date, wife reports he was good when they left home but then something changed on the way to therapy, he became confused and disoriented, difficulty getting patient out of the car today.  Patient rigid with movements this date, max assist to get out of the transport chair today.  He was easily distracted and wife and caregive had to watch session from a distance, otherwise, pt would be distracted with their presence and would not focus on therapist and exercises.  Patient could not perform LSVT BIG exercises this date, switched to a variety of other exercises to engage patient and promote participation.  Difficulty with sit to stand this date.  Increased assist with all exercises and tasks this date.  Continue to work towards goals, will continue to provide support and direction with dementia related care.                OT Education - 09/18/20 1929  Education provided Yes    Education Details safety, cues, exercises, mobility    Person(s) Educated Patient;Spouse    Methods Explanation;Demonstration;Tactile cues    Comprehension Verbalized understanding;Returned demonstration;Need further instruction               OT Long Term Goals - 09/14/20 0902      OT LONG TERM GOAL #1   Title Patient will improve gait speed and endurance to be able to ambulate 900 feet in 6 minutes to negotiate around the home and community safely in 4 weeks.    Baseline 740 feet at eval, 10th visit 745 feet and had to rest up to 1 min during testing.  17th visit 760 feet    Time 4    Period Weeks    Status On-going    Target Date 10/23/20      OT LONG TERM GOAL #2   Title Patient will complete HEP for maximal daily exercises with min assist in  4 weeks.    Baseline no current program at eval, 10th visit-becoming more familiar with HEP and with assist from wife, 17th min assist but requires moderate cues at times    Time 4    Period Weeks    Status On-going    Target Date 10/23/20      OT LONG TERM GOAL #3   Title Patient will transfer from to sit to stand without the use of arms safely and independently from a variety of chairs/surfaces in 4 weeks.    Baseline difficulty with low surfaces, 10th visit: still has difficulty from lower surfaces such as couch    Time 4    Period Weeks    Status Achieved      OT LONG TERM GOAL #4   Title Will assess BERG balance next session    Baseline unable to perform at eval due to time constraints, patient has functional balance deficits during eval    Time 1    Period Weeks    Status Achieved      OT LONG TERM GOAL #5   Title Patient will complete light meal prep with supervision.    Baseline unable at eval, 10th visit:  continues to require assist from wife    Time 4    Period Weeks    Status Not Met    Target Date 10/23/20      OT LONG TERM GOAL #6   Title Patient will demonstrate improvements in handwriting with legiblity greater than 80% for printing and signing name on important papers.    Baseline 50% legiblity at eval, mircrographia noted. 10th visit:  improved to 70%    Time 4    Period Weeks    Status Partially Met    Target Date 10/23/20      OT LONG TERM GOAL #7   Title Pt will improve fine motor coordination skills to manage buttons on clothing with modified independence.    Baseline difficulty with buttons and requires moderate assist.    Time 6    Period Weeks    Status New    Target Date 10/23/20                  Patient will benefit from skilled therapeutic intervention in order to improve the following deficits and impairments:   Body Structure / Function / Physical Skills: ADL,Dexterity,Flexibility,ROM,Strength,Balance,Coordination,FMC,IADL,Body  mechanics,Gait,Endurance,UE functional use,Decreased knowledge of use of DME,GMC,Mobility Cognitive Skills: Attention,Memory,Sequencing Psychosocial Skills: Environmental  Adaptations,Habits,Routines and Behaviors   Visit  Diagnosis: Difficulty in walking, not elsewhere classified  Unsteadiness on feet  Other lack of coordination  Abnormal posture  Muscle weakness (generalized)  Cognitive communication deficit    Problem List Patient Active Problem List   Diagnosis Date Noted  . Raynaud phenomenon 03/22/2020  . Chronic venous insufficiency 03/22/2020  . B12 deficiency 03/10/2020  . Acute ischemic multifocal anterior circulation stroke ([place]) 03/09/2020  . Pseudophakia of left eye 07/02/2018  . Obesity, Class I, BMI 30-34.9 02/15/2018  . Closed displaced fracture of distal phalanx of left middle finger 03/08/2017  . Closed fracture of distal end of left radius with routine healing 01/16/2017  . Closed fracture of nasal bone with routine healing 01/12/2017  . Ankylosing spondylitis ([place]) 12/19/2016  . Closed fracture of dorsal (thoracic) vertebra ([place]) 12/19/2016  . Fall 12/19/2016  . Fracture of left radius 12/19/2016  . Primary osteoarthritis of right knee 10/14/2015  . Acute pain of right knee 09/09/2015  . Aftercare following joint replacement surgery 07/31/2015  . Closed nondisplaced fracture of lateral condyle of femur ([place]) 07/31/2015  . Bilateral edema of lower extremity 06/26/2014  . Chronic atrial fibrillation ([place]) 07/13/2013  . Wound disruption, post-op, skin 10/11/2012  . Cellulitis and abscess 10/05/2012  . Hyperlipidemia 08/17/2012  . Osteoarthritis 08/17/2012  . Seizure disorder ([place]) 08/16/2012  . Hip pain 12/12/2011  . 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 CAD (coronary artery disease) 07/07/2011  . Diabetes mellitus, type 2 ([place]) 07/07/2011  . HTN (hypertension) 07/07/2011   Sheriden Archibeque T Tomasita Morrow, OTR/L, CLT  Angie Piercey 09/19/2020, 7:34 PM  [place] MAIN [facility] SERVICES [address] [place], [place], [address] Phone: [phone]   Fax:  [phone]  Name: CANDY ZIEGLER MRN: [id number] Date of Birth: 11/25/1940

## 2020-09-21 ENCOUNTER — Ambulatory Visit: Payer: Medicare PPO | Admitting: Occupational Therapy

## 2020-09-21 ENCOUNTER — Other Ambulatory Visit: Payer: Self-pay

## 2020-09-21 DIAGNOSIS — R2681 Unsteadiness on feet: Secondary | ICD-10-CM | POA: Diagnosis not present

## 2020-09-21 DIAGNOSIS — M6281 Muscle weakness (generalized): Secondary | ICD-10-CM

## 2020-09-21 DIAGNOSIS — R41841 Cognitive communication deficit: Secondary | ICD-10-CM

## 2020-09-21 DIAGNOSIS — R293 Abnormal posture: Secondary | ICD-10-CM

## 2020-09-21 DIAGNOSIS — R262 Difficulty in walking, not elsewhere classified: Secondary | ICD-10-CM

## 2020-09-21 DIAGNOSIS — R278 Other lack of coordination: Secondary | ICD-10-CM

## 2020-09-23 ENCOUNTER — Encounter: Payer: Self-pay | Admitting: Occupational Therapy

## 2020-09-23 ENCOUNTER — Other Ambulatory Visit: Payer: Self-pay

## 2020-09-23 ENCOUNTER — Ambulatory Visit: Payer: Medicare PPO | Admitting: Occupational Therapy

## 2020-09-23 ENCOUNTER — Encounter: Payer: Medicare PPO | Admitting: Speech Pathology

## 2020-09-23 DIAGNOSIS — R278 Other lack of coordination: Secondary | ICD-10-CM

## 2020-09-23 DIAGNOSIS — R2681 Unsteadiness on feet: Secondary | ICD-10-CM | POA: Diagnosis not present

## 2020-09-23 DIAGNOSIS — R41841 Cognitive communication deficit: Secondary | ICD-10-CM

## 2020-09-23 DIAGNOSIS — R293 Abnormal posture: Secondary | ICD-10-CM

## 2020-09-23 DIAGNOSIS — M6281 Muscle weakness (generalized): Secondary | ICD-10-CM

## 2020-09-23 DIAGNOSIS — R262 Difficulty in walking, not elsewhere classified: Secondary | ICD-10-CM

## 2020-09-23 NOTE — Therapy (Signed)
[place] MAIN [facility] SERVICES [address] [place], [place], [address] Phone: [phone]   Fax:  [phone]  Occupational Therapy Treatment  Patient Details  Name: Ian King MRN: [id number] Date of Birth: [birth year] No data recorded  Encounter Date: 09/23/2020   OT End of Session - 09/23/20 1842    Visit Number 21    Number of Visits 29    Date for OT Re-Evaluation 10/23/20    OT Start Time 1300    OT Stop Time 1346    OT Time Calculation (min) 46 min    Activity Tolerance Patient tolerated treatment well    Behavior During Therapy [facility] for tasks assessed/performed           Past Medical History:  Diagnosis Date  . A-fib ([place])   . Diabetes mellitus without complication ([place])   . Hypertension     Past Surgical History:  Procedure Laterality Date  . arm surgery    . BACK SURGERY    . JOINT REPLACEMENT     hip  . JOINT REPLACEMENT Right    knee  . SPINE SURGERY      There were no vitals filed for this visit.   Subjective Assessment - 09/23/20 1840    Subjective  Pt and wife both report patient is having a good day, smiling happy and ready to get started, able to recognize therapist from past sessions.    Patient is accompanied by: Family member    Patient Stated Goals Patient reports he would like to be as independent as he can for himself.  "I want to do as much as I can."    Currently in Pain? No/denies    Pain Score 0-No pain           Performance ofLSVT Daily Session Maximal Daily Exercises: Sustained movements are designed to rescale the amplitude of movement output for generalization to daily functional activities. Performed as follows for 1 set of 10 repetitions each: Multi directional sustained movements- 1) Floor to ceiling, 2) Side to side. Multi directional Repetitive movements performed in standing and are designed to provide retraining effort needed for sustained muscle activation in tasks Performed as  follows: 3) Step and reach forward, 4) Step and Reach Backwards, 5) Step and reach sideways, 6) Rock and reach forward/backward, 7) Rock and reach sideways. Minassistfor balance and use of chair in adapted version.moderate cues and tactile cues for reciprocal arm movement patterns with exercises.    Sit to stand with min guard and cues to shift weight forward when performing transitional movements.   Short distance ambulation, 200 feet for 2 trials with use of cane and therapist min guard.    Reaching tasks with use of shape tower in sitting, able to complete 3 of the 4 levels of reach, moderate cues for directions.   Response to tx:  Patient's confusion minimal today and was able to follow commands with moderate cues and therapist demonstration.  Was familiar with exercises this date and less rigid with movement patterns today.  Was not resistive today to any tasks and was open to walking today.  Difficulty with top level of reach with 4 level tower this date.  Sit to stand required cues for weight shift forwards, tended to initially push legs back on the mat when attempting to stand.  Continue to work towards goals in plan of care to improve balance, safety, functional mobility and coordination for daily tasks.  OT Education - 09/23/20 1841    Education provided Yes    Education Details safety, cues, exercises, mobility    Person(s) Educated Patient;Spouse    Methods Explanation;Demonstration;Tactile cues    Comprehension Verbalized understanding;Returned demonstration;Need further instruction               OT Long Term Goals - 09/23/20 1323      OT LONG TERM GOAL #1   Title Patient will improve gait speed and endurance to be able to ambulate 900 feet in 6 minutes to negotiate around the home and community safely in 4 weeks.    Baseline 740 feet at eval, 10th visit 745 feet and had to rest up to 1 min during testing.  17th visit 760 feet     Time 4    Period Weeks    Status On-going    Target Date 10/23/20      OT LONG TERM GOAL #2   Title Patient will complete HEP for maximal daily exercises with min assist in 4 weeks.    Baseline no current program at eval, 10th visit-becoming more familiar with HEP and with assist from wife, 17th min assist but requires moderate cues at times    Time 4    Period Weeks    Status On-going    Target Date 10/23/20      OT LONG TERM GOAL #3   Title Patient will transfer from to sit to stand without the use of arms safely and independently from a variety of chairs/surfaces in 4 weeks.    Baseline difficulty with low surfaces, 10th visit: still has difficulty from lower surfaces such as couch    Time 4    Period Weeks    Status Achieved      OT LONG TERM GOAL #4   Title Will assess BERG balance next session    Baseline unable to perform at eval due to time constraints, patient has functional balance deficits during eval    Time 1    Period Weeks    Status Achieved      OT LONG TERM GOAL #5   Title Patient will complete light meal prep with supervision.    Baseline unable at eval, 10th visit:  continues to require assist from wife    Time 4    Period Weeks    Status Not Met    Target Date 10/23/20      OT LONG TERM GOAL #6   Title Patient will demonstrate improvements in handwriting with legiblity greater than 80% for printing and signing name on important papers.    Baseline 50% legiblity at eval, mircrographia noted. 10th visit:  improved to 70%    Time 4    Period Weeks    Status Partially Met    Target Date 10/23/20      OT LONG TERM GOAL #7   Title Pt will improve fine motor coordination skills to manage buttons on clothing with modified independence.    Baseline difficulty with buttons and requires moderate assist.    Time 6    Period Weeks    Status On-going    Target Date 10/23/20                 Plan - 09/23/20 1919    Clinical Impression Statement  Patient's confusion minimal today and was able to follow commands with moderate cues and therapist demonstration.  Was familiar with exercises this date and less rigid with movement patterns today.  Was not resistive today to any tasks and was open to walking today.  Difficulty with top level of reach with 4 level tower this date.  Sit to stand required cues for weight shift forwards, tended to initially push legs back on the mat when attempting to stand.  Continue to work towards goals in plan of care to improve balance, safety, functional mobility and coordination for daily tasks.    OT Occupational Profile and History Detailed Assessment- Review of Records and additional review of physical, cognitive, psychosocial history related to current functional performance           Patient will benefit from skilled therapeutic intervention in order to improve the following deficits and impairments:   Body Structure / Function / Physical Skills: ADL,Dexterity,Flexibility,ROM,Strength,Balance,Coordination,FMC,IADL,Body mechanics,Gait,Endurance,UE functional use,Decreased knowledge of use of DME,GMC,Mobility Cognitive Skills: Attention,Memory,Sequencing Psychosocial Skills: Environmental  Adaptations,Habits,Routines and Behaviors   Visit Diagnosis: Unsteadiness on feet  Other lack of coordination  Difficulty in walking, not elsewhere classified  Muscle weakness (generalized)  Abnormal posture  Cognitive communication deficit    Problem List Patient Active Problem List   Diagnosis Date Noted  . Raynaud phenomenon 03/22/2020  . Chronic venous insufficiency 03/22/2020  . B12 deficiency 03/10/2020  . Acute ischemic multifocal anterior circulation stroke ([place]) 03/09/2020  . Pseudophakia of left eye 07/02/2018  . Obesity, Class I, BMI 30-34.9 02/15/2018  . Closed displaced fracture of distal phalanx of left middle finger 03/08/2017  . Closed fracture of distal end of left radius with routine  healing 01/16/2017  . Closed fracture of nasal bone with routine healing 01/12/2017  . Ankylosing spondylitis ([place]) 12/19/2016  . Closed fracture of dorsal (thoracic) vertebra ([place]) 12/19/2016  . Fall 12/19/2016  . Fracture of left radius 12/19/2016  . Primary osteoarthritis of right knee 10/14/2015  . Acute pain of right knee 09/09/2015  . Aftercare following joint replacement surgery 07/31/2015  . Closed nondisplaced fracture of lateral condyle of femur ([place]) 07/31/2015  . Bilateral edema of lower extremity 06/26/2014  . Chronic atrial fibrillation ([place]) 07/13/2013  . Wound disruption, post-op, skin 10/11/2012  . Cellulitis and abscess 10/05/2012  . Hyperlipidemia 08/17/2012  . Osteoarthritis 08/17/2012  . Seizure disorder ([place]) 08/16/2012  . Hip pain 12/12/2011  . 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 CAD (coronary artery disease) 07/07/2011  . Diabetes mellitus, type 2 ([place]) 07/07/2011  . HTN (hypertension) 07/07/2011   Abraham Margulies T Tomasita Morrow, OTR/L, CLT  Esten Dollar 09/23/2020, 7:20 PM  [place] MAIN [facility] SERVICES [address] [place], [place], [address] Phone: [phone]   Fax:  [phone]  Name: Ian King MRN: [id number] Date of Birth: [birth year]

## 2020-09-23 NOTE — Therapy (Signed)
[place] MAIN [facility] SERVICES [address] [place], [place], [address] Phone: [phone]   Fax:  [phone]  Occupational Therapy Treatment/Progress Update Reporting period from to 09/21/2020  Patient Details  Name: Ian King MRN: [id number] Date of Birth: 03-Feb-1941 No data recorded  Encounter Date: 09/21/2020   OT End of Session - 09/23/20 1320    Visit Number 20    Number of Visits 29    Date for OT Re-Evaluation 10/23/20    OT Start Time 1315    OT Stop Time 1345    OT Time Calculation (min) 30 min    Activity Tolerance Patient tolerated treatment well    Behavior During Therapy [employer] for tasks assessed/performed           Past Medical History:  Diagnosis Date  . A-fib ([place])   . Diabetes mellitus without complication ([place])   . Hypertension     Past Surgical History:  Procedure Laterality Date  . arm surgery    . BACK SURGERY    . JOINT REPLACEMENT     hip  . JOINT REPLACEMENT Right    knee  . SPINE SURGERY      There were no vitals filed for this visit.   Subjective Assessment - 09/23/20 1319    Subjective  Pt reports he is feeling better today, confused and talking about going to the beach this past weekend and the boat sinking.  Wife reports he has been talking about this most of the weekend although there was no truth to the situation.    Patient is accompanied by: Family member    Patient Stated Goals Patient reports he would like to be as independent as he can for himself.  "I want to do as much as I can."    Currently in Pain? No/denies    Pain Score 0-No pain          Pt was late to therapy this date, wife reports he had to go to the bathroom when it was time to leave from home.    Therapeutic Exercise: Pt seen for strengthening tasks with use of 1.5# Dowel from seated position for shoulder flexion, ABD, ADD, chest press, forwards and backwards circles and diagonal patterns.  Completed 2 sets of 12  reps each with therapist mirroring task and providing cues for proper form and technique.   Sit to stand from lowest position on mat table for 10 reps with cues to initiate task and to fully stand.   Functional mobility around the gym area with therapist providing min guard and cues for cane use.  3 sets of 25 feet each.  Reassessment and Goals updated this date to reflect progress.    Pt asking to go to the bathroom again.  Min assist for toilet transfer and clothing negotiation.  Wife came in at the end of the session to assist patient with hygiene and manage clothing before leaving the clinic.    Response to tx:   Patient remains confused this date thinking he had a boat at the beach that sunk, perseverated on this idea most of the treatment session and was difficult at times to redirect to tasks.  Pt continues to progress towards goals, since completing intensive portion of LSVT BIG he has had increased episodes of confusion and requires increased assistance with exercises.  Patient distracted easily with tasks.  Mobility has been limited more with increased confusion and patient requires increases cues and direction.  Occasional min assist for sit to stand transfers and cues to weight shift forwards in chair.  Patient able to follow commands and mirroring with dowel exercises this date.  Continue to work towards goals and supporting wife with strategies to assist with dementia management which impacts pts daily roles and routines. Patient continues to benefit from skilled OT services to maximize safety and independence in ADL and IADL tasks at home and in the community.                        OT Education - 09/23/20 1320    Education provided Yes    Education Details safety, cues, exercises, mobility    Person(s) Educated Patient;Spouse    Methods Explanation;Demonstration;Tactile cues    Comprehension Verbalized understanding;Returned demonstration;Need further instruction                OT Long Term Goals - 09/23/20 1323      OT LONG TERM GOAL #1   Title Patient will improve gait speed and endurance to be able to ambulate 900 feet in 6 minutes to negotiate around the home and community safely in 4 weeks.    Baseline 740 feet at eval, 10th visit 745 feet and had to rest up to 1 min during testing.  17th visit 760 feet    Time 4    Period Weeks    Status On-going    Target Date 10/23/20      OT LONG TERM GOAL #2   Title Patient will complete HEP for maximal daily exercises with min assist in 4 weeks.    Baseline no current program at eval, 10th visit-becoming more familiar with HEP and with assist from wife, 17th min assist but requires moderate cues at times    Time 4    Period Weeks    Status On-going    Target Date 10/23/20      OT LONG TERM GOAL #3   Title Patient will transfer from to sit to stand without the use of arms safely and independently from a variety of chairs/surfaces in 4 weeks.    Baseline difficulty with low surfaces, 10th visit: still has difficulty from lower surfaces such as couch    Time 4    Period Weeks    Status Achieved      OT LONG TERM GOAL #4   Title Will assess BERG balance next session    Baseline unable to perform at eval due to time constraints, patient has functional balance deficits during eval    Time 1    Period Weeks    Status Achieved      OT LONG TERM GOAL #5   Title Patient will complete light meal prep with supervision.    Baseline unable at eval, 10th visit:  continues to require assist from wife    Time 4    Period Weeks    Status Not Met    Target Date 10/23/20      OT LONG TERM GOAL #6   Title Patient will demonstrate improvements in handwriting with legiblity greater than 80% for printing and signing name on important papers.    Baseline 50% legiblity at eval, mircrographia noted. 10th visit:  improved to 70%    Time 4    Period Weeks    Status Partially Met    Target Date 10/23/20       OT LONG TERM GOAL #7   Title Pt will improve fine   motor coordination skills to manage buttons on clothing with modified independence.    Baseline difficulty with buttons and requires moderate assist.    Time 6    Period Weeks    Status On-going    Target Date 10/23/20                 Plan - 09/23/20 1321    Clinical Impression Statement Patient remains confused this date thinking he had a boat at the beach that sunk, perseverated on this idea most of the treatment session and was difficult at times to redirect to tasks.  Pt continues to progress towards goals, since completing intensive portion of LSVT BIG he has had increased episodes of confusion and requires increased assistance with exercises.  Patient distracted easily with tasks.  Mobility has been limited more with increased confusion and patient requires increases cues and direction.  Occasional min assist for sit to stand transfers and cues to weight shift forwards in chair.  Patient able to follow commands and mirroring with dowel exercises this date.  Continue to work towards goals and supporting wife with strategies to assist with dementia management which impacts pts daily roles and routines. Patient continues to benefit from skilled OT services to maximize safety and independence in ADL and IADL tasks at home and in the community.    OT Occupational Profile and History Detailed Assessment- Review of Records and additional review of physical, cognitive, psychosocial history related to current functional performance    Occupational performance deficits (Please refer to evaluation for details): ADL's;IADL's;Play;Leisure;Social Participation    Body Structure / Function / Physical Skills ADL;Dexterity;Flexibility;ROM;Strength;Balance;Coordination;FMC;IADL;Body mechanics;Gait;Endurance;UE functional use;Decreased knowledge of use of DME;GMC;Mobility    Cognitive Skills Attention;Memory;Sequencing    Psychosocial Skills  Environmental  Adaptations;Habits;Routines and Behaviors    Rehab Potential Good    Clinical Decision Making Limited treatment options, no task modification necessary    Comorbidities Affecting Occupational Performance: Presence of comorbidities impacting occupational performance    Comorbidities impacting occupational performance description: dementia, chronic afib, h/o seizures, CVA, h/o back surgery/pain, HTN, anklyosing spondylitis    Modification or Assistance to Complete Evaluation  No modification of tasks or assist necessary to complete eval    OT Frequency 2x / week    OT Duration 6 weeks    OT Treatment/Interventions Self-care/ADL training;Cryotherapy;Therapeutic exercise;DME and/or AE instruction;Functional Mobility Training;Cognitive remediation/compensation;Balance training;Neuromuscular education;Gait Training;Manual Therapy;Moist Heat;Therapeutic activities;Patient/family education;Stair Training    Consulted and Agree with Plan of Care Patient;Family member/caregiver    Family Member Consulted Katherine, wife           Patient will benefit from skilled therapeutic intervention in order to improve the following deficits and impairments:   Body Structure / Function / Physical Skills: ADL,Dexterity,Flexibility,ROM,Strength,Balance,Coordination,FMC,IADL,Body mechanics,Gait,Endurance,UE functional use,Decreased knowledge of use of DME,GMC,Mobility Cognitive Skills: Attention,Memory,Sequencing Psychosocial Skills: Environmental  Adaptations,Habits,Routines and Behaviors   Visit Diagnosis: Unsteadiness on feet  Other lack of coordination  Difficulty in walking, not elsewhere classified  Muscle weakness (generalized)  Abnormal posture  Cognitive communication deficit    Problem List Patient Active Problem List   Diagnosis Date Noted  . Raynaud phenomenon 03/22/2020  . Chronic venous insufficiency 03/22/2020  . B12 deficiency 03/10/2020  . Acute ischemic multifocal  anterior circulation stroke ([facility]) 03/09/2020  . Pseudophakia of left eye 07/02/2018  . Obesity, Class I, BMI 30-34.9 02/15/2018  . Closed displaced fracture of distal phalanx of left middle finger 03/08/2017  . Closed fracture of distal end of left radius with routine healing 01/16/2017  .   Closed fracture of nasal bone with routine healing 01/12/2017  . 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  . Closed fracture of dorsal (thoracic) vertebra ([facility]) 12/19/2016  . Fall 12/19/2016  . Fracture of left radius 12/19/2016  . Primary osteoarthritis of right knee 10/14/2015  . Acute pain of right knee 09/09/2015  . Aftercare following joint replacement surgery 07/31/2015  . Closed nondisplaced fracture of lateral condyle of femur ([facility]) 07/31/2015  . Bilateral edema of lower extremity 06/26/2014  . Chronic atrial fibrillation ([facility]) 07/13/2013  . Wound disruption, post-op, skin 10/11/2012  . Cellulitis and abscess 10/05/2012  . Hyperlipidemia 08/17/2012  . Osteoarthritis 08/17/2012  . Seizure disorder ([facility]) 08/16/2012  . Hip pain 12/12/2011  . 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 CAD (coronary artery disease) 07/07/2011  . Diabetes mellitus, type 2 ([facility]) 07/07/2011  . HTN (hypertension) 07/07/2011    T , OTR/L, CLT , 09/23/2020, 5:21 PM  [place] [facility] RE[facility] SERVICES [address] [place], [place], [address] Phone: [phone]   Fax:  [phone]  Name: Ian King MRN: [id number] Date of Birth: 06/29/1941 

## 2020-09-29 ENCOUNTER — Ambulatory Visit: Payer: Medicare PPO | Admitting: Occupational Therapy

## 2020-09-29 ENCOUNTER — Other Ambulatory Visit: Payer: Self-pay

## 2020-09-29 DIAGNOSIS — R278 Other lack of coordination: Secondary | ICD-10-CM

## 2020-09-29 DIAGNOSIS — R2681 Unsteadiness on feet: Secondary | ICD-10-CM

## 2020-09-29 DIAGNOSIS — R41841 Cognitive communication deficit: Secondary | ICD-10-CM

## 2020-09-29 DIAGNOSIS — R293 Abnormal posture: Secondary | ICD-10-CM

## 2020-09-29 DIAGNOSIS — M6281 Muscle weakness (generalized): Secondary | ICD-10-CM

## 2020-09-29 DIAGNOSIS — R262 Difficulty in walking, not elsewhere classified: Secondary | ICD-10-CM

## 2020-09-30 ENCOUNTER — Encounter: Payer: Self-pay | Admitting: Occupational Therapy

## 2020-09-30 NOTE — Therapy (Signed)
[place] MAIN [facility] SERVICES [address] [place], [place], [address] Phone: [phone]   Fax:  [phone]  Occupational Therapy Treatment  Patient Details  Name: Ian King MRN: [id number] Date of Birth: 01/30/1941 No data recorded  Encounter Date: 09/29/2020   OT End of Session - 09/30/20 1614    Visit Number 22    Number of Visits 29    Date for OT Re-Evaluation 10/23/20    OT Start Time 1301    OT Stop Time 1347    OT Time Calculation (min) 46 min    Activity Tolerance Patient tolerated treatment well    Behavior During Therapy [facility] for tasks assessed/performed           Past Medical History:  Diagnosis Date  . A-fib ([place])   . Diabetes mellitus without complication ([place])   . Hypertension     Past Surgical History:  Procedure Laterality Date  . arm surgery    . BACK SURGERY    . JOINT REPLACEMENT     hip  . JOINT REPLACEMENT Right    knee  . SPINE SURGERY      There were no vitals filed for this visit.   Subjective Assessment - 09/30/20 1613    Subjective  Pt reports he is having a good day, reluctant to do any walking but once up moving, he did well.    Patient is accompanied by: Family member    Patient Stated Goals Patient reports he would like to be as independent as he can for himself.  "I want to do as much as I can."    Currently in Pain? No/denies    Pain Score 0-No pain            Therapeutic Exercise: Completed floor to ceiling exercise from seated position with holding for 10 secs, arms wide, cues for proper form and technique.  Therapist demo and mirroring with performance.  2# dowel for bilateral strengthening and ROM, shoulder flexion, ABD/ADD, chest press, forwards and backwards circles, diagonal patterns for 10 reps for 2 sets each from seated position.  Therapist demo and cues. Transfers with min to mod assist from transport chair, min assist from mat table.  Repeated trials of sit to stand  with cues for weight shifting forwards.   Functional mobility for 4 sets of 50 feet each, min guard and use of cane.   SAEBO tower from seated position with use of looped balls and able to complete 1st 3 levels, difficulty reaching to top level with left UE.   Response to tx: Patient following commands consistently this date, demonstrates increased stiffness and wife reports they have not been as consistent with daily exercises especially when patient has been confused.  Sit to stand for many trials today required min to mod assist.  After multiple reps he was able to complete with min assist and cues for weight shifting forwards.  Continue to work towards goals in plan of care to increase independence in necessary daily tasks.                      OT Education - 09/30/20 1614    Education provided Yes    Education Details safety, cues, exercises, mobility    Person(s) Educated Patient;Spouse    Methods Explanation;Demonstration;Tactile cues    Comprehension Verbalized understanding;Returned demonstration;Need further instruction               OT Long  Term Goals - 09/23/20 1323      OT LONG TERM GOAL #1   Title Patient will improve gait speed and endurance to be able to ambulate 900 feet in 6 minutes to negotiate around the home and community safely in 4 weeks.    Baseline 740 feet at eval, 10th visit 745 feet and had to rest up to 1 min during testing.  17th visit 760 feet    Time 4    Period Weeks    Status On-going    Target Date 10/23/20      OT LONG TERM GOAL #2   Title Patient will complete HEP for maximal daily exercises with min assist in 4 weeks.    Baseline no current program at eval, 10th visit-becoming more familiar with HEP and with assist from wife, 17th min assist but requires moderate cues at times    Time 4    Period Weeks    Status On-going    Target Date 10/23/20      OT LONG TERM GOAL #3   Title Patient will transfer from to sit to stand  without the use of arms safely and independently from a variety of chairs/surfaces in 4 weeks.    Baseline difficulty with low surfaces, 10th visit: still has difficulty from lower surfaces such as couch    Time 4    Period Weeks    Status Achieved      OT LONG TERM GOAL #4   Title Will assess BERG balance next session    Baseline unable to perform at eval due to time constraints, patient has functional balance deficits during eval    Time 1    Period Weeks    Status Achieved      OT LONG TERM GOAL #5   Title Patient will complete light meal prep with supervision.    Baseline unable at eval, 10th visit:  continues to require assist from wife    Time 4    Period Weeks    Status Not Met    Target Date 10/23/20      OT LONG TERM GOAL #6   Title Patient will demonstrate improvements in handwriting with legiblity greater than 80% for printing and signing name on important papers.    Baseline 50% legiblity at eval, mircrographia noted. 10th visit:  improved to 70%    Time 4    Period Weeks    Status Partially Met    Target Date 10/23/20      OT LONG TERM GOAL #7   Title Pt will improve fine motor coordination skills to manage buttons on clothing with modified independence.    Baseline difficulty with buttons and requires moderate assist.    Time 6    Period Weeks    Status On-going    Target Date 10/23/20                 Plan - 09/30/20 1615    Clinical Impression Statement Patient following commands consistently this date, demonstrates increased stiffness and wife reports they have not been as consistent with daily exercises especially when patient has been confused.  Sit to stand for many trials today required min to mod assist.  After multiple reps he was able to complete with min assist and cues for weight shifting forwards.  Continue to work towards goals in plan of care to increase independence in necessary daily tasks.    OT Occupational Profile and History Detailed  Assessment- Review of  Records and additional review of physical, cognitive, psychosocial history related to current functional performance    Occupational performance deficits (Please refer to evaluation for details): ADL's;IADL's;Play;Leisure;Social Participation    Body Structure / Function / Physical Skills ADL;Dexterity;Flexibility;ROM;Strength;Balance;Coordination;FMC;IADL;Body mechanics;Gait;Endurance;UE functional use;Decreased knowledge of use of DME;GMC;Mobility    Cognitive Skills Attention;Memory;Sequencing    Rehab Potential Good    Clinical Decision Making Limited treatment options, no task modification necessary    Comorbidities Affecting Occupational Performance: Presence of comorbidities impacting occupational performance    Comorbidities impacting occupational performance description: dementia, chronic afib, h/o seizures, CVA, h/o back surgery/pain, HTN, anklyosing spondylitis    Modification or Assistance to Complete Evaluation  No modification of tasks or assist necessary to complete eval    OT Frequency 2x / week    OT Duration 6 weeks    OT Treatment/Interventions Self-care/ADL training;Cryotherapy;Therapeutic exercise;DME and/or AE instruction;Functional Mobility Training;Cognitive remediation/compensation;Balance training;Neuromuscular education;Gait Training;Manual Therapy;Moist Heat;Therapeutic activities;Patient/family education;Stair Training    Consulted and Agree with Plan of Care Patient;Family member/caregiver    Family Member Consulted Belenda Cruise, wife           Patient will benefit from skilled therapeutic intervention in order to improve the following deficits and impairments:   Body Structure / Function / Physical Skills: ADL,Dexterity,Flexibility,ROM,Strength,Balance,Coordination,FMC,IADL,Body mechanics,Gait,Endurance,UE functional use,Decreased knowledge of use of DME,GMC,Mobility Cognitive Skills: Attention,Memory,Sequencing     Visit  Diagnosis: Unsteadiness on feet  Muscle weakness (generalized)  Difficulty in walking, not elsewhere classified  Other lack of coordination  Abnormal posture  Cognitive communication deficit    Problem List Patient Active Problem List   Diagnosis Date Noted  . Raynaud phenomenon 03/22/2020  . Chronic venous insufficiency 03/22/2020  . B12 deficiency 03/10/2020  . Acute ischemic multifocal anterior circulation stroke ([place]) 03/09/2020  . Pseudophakia of left eye 07/02/2018  . Obesity, Class I, BMI 30-34.9 02/15/2018  . Closed displaced fracture of distal phalanx of left middle finger 03/08/2017  . Closed fracture of distal end of left radius with routine healing 01/16/2017  . Closed fracture of nasal bone with routine healing 01/12/2017  . Ankylosing spondylitis ([place]) 12/19/2016  . Closed fracture of dorsal (thoracic) vertebra ([place]) 12/19/2016  . Fall 12/19/2016  . Fracture of left radius 12/19/2016  . Primary osteoarthritis of right knee 10/14/2015  . Acute pain of right knee 09/09/2015  . Aftercare following joint replacement surgery 07/31/2015  . Closed nondisplaced fracture of lateral condyle of femur ([place]) 07/31/2015  . Bilateral edema of lower extremity 06/26/2014  . Chronic atrial fibrillation ([place]) 07/13/2013  . Wound disruption, post-op, skin 10/11/2012  . Cellulitis and abscess 10/05/2012  . Hyperlipidemia 08/17/2012  . Osteoarthritis 08/17/2012  . Seizure disorder ([place]) 08/16/2012  . Hip pain 12/12/2011  . 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 CAD (coronary artery disease) 07/07/2011  . Diabetes mellitus, type 2 ([place]) 07/07/2011  . HTN (hypertension) 07/07/2011   Mercy Leppla T Tomasita Morrow, OTR/L, CLT  Mayvis Agudelo 09/30/2020, 4:26 PM  [place] MAIN [facility] SERVICES [address] [place], [place], [address] Phone: [phone]   Fax:  [phone]  Name: SHAD LEDVINA MRN: [id number] Date of Birth: [birth year]

## 2020-10-01 ENCOUNTER — Encounter: Payer: Self-pay | Admitting: Occupational Therapy

## 2020-10-01 ENCOUNTER — Other Ambulatory Visit: Payer: Self-pay

## 2020-10-01 ENCOUNTER — Ambulatory Visit: Payer: Medicare PPO | Admitting: Occupational Therapy

## 2020-10-01 DIAGNOSIS — R41841 Cognitive communication deficit: Secondary | ICD-10-CM

## 2020-10-01 DIAGNOSIS — R2681 Unsteadiness on feet: Secondary | ICD-10-CM

## 2020-10-01 DIAGNOSIS — R293 Abnormal posture: Secondary | ICD-10-CM

## 2020-10-01 DIAGNOSIS — M6281 Muscle weakness (generalized): Secondary | ICD-10-CM

## 2020-10-01 DIAGNOSIS — R278 Other lack of coordination: Secondary | ICD-10-CM

## 2020-10-01 DIAGNOSIS — R262 Difficulty in walking, not elsewhere classified: Secondary | ICD-10-CM

## 2020-10-01 NOTE — Therapy (Addendum)
[place] MAIN [facility] SERVICES [address] [place], [place], [address] Phone: [phone]   Fax:  [phone]  Occupational Therapy Treatment  Patient Details  Name: Ian King MRN: [id number] Date of Birth: [birth year] No data recorded  Encounter Date: 10/01/2020   OT End of Session - 10/01/20 1325    Visit Number 23    Number of Visits 29    Date for OT Re-Evaluation 10/23/20    OT Start Time 1100    OT Stop Time 1144    OT Time Calculation (min) 44 min    Activity Tolerance Patient tolerated treatment well    Behavior During Therapy [facility] for tasks assessed/performed           Past Medical History:  Diagnosis Date  . A-fib ([place])   . Diabetes mellitus without complication ([place])   . Hypertension     Past Surgical History:  Procedure Laterality Date  . arm surgery    . BACK SURGERY    . JOINT REPLACEMENT     hip  . JOINT REPLACEMENT Right    knee  . SPINE SURGERY      There were no vitals filed for this visit.   Subjective Assessment - 10/01/20 1324    Subjective  Patient smiling and waving to therapist on arrival, ready to work today.  Recognizes therapist.    Patient Stated Goals Patient reports he would like to be as independent as he can for himself.  "I want to do as much as I can."    Currently in Pain? No/denies    Pain Score 0-No pain           Patient seen for upper body strengthening with use of UBE with resistance of 2.0 to 3.0 for 8 mins, forwards, backwards with therapist in constant attendance to adjust settings and to ensure grip.   Patient seen for reaching patterns in various directions from seated position for ROM and strengthening of LUE. Patient seen for functional mobility skills with use of cane, min guard and cues for sit to stand form chair and mat table, increased assist with sit to stand from transport chair.  Patient participating in 3 trials of amb with cane for 100 feet each trial with cues  for posture, amplitude of gait.  Patient fatigues easily and requires rest breaks between trials.   ROM and strengthening with use of Shape tower 3 levels from seated position, unable to reach the 4th level with left UE despite efforts.  Moderate cues for directions on what to do and repeated throughout the exercise.    Response to tx:   Pt alert, cooperative today, did not appear to be confused today.  He recognized therapist and knew he was here for exercises and was ready and agreeable to work with therapist.  Pt did require min cues to initiate all tasks and repeated directions for task with shape tower reaching.  Transfers improved from all surfaces but still had difficulty with initial sit to stand from transport chair, I believe this is because pt has to turn to the side to get up from this chair versus a regular chair.  Posture in forward flexion with ambulation and able to correct with verbal and tactile cues/therapist demo.                       OT Education - 10/01/20 1325    Education provided Yes    Education  Details safety, cues, exercises, mobility    Person(s) Educated Patient;Spouse    Methods Explanation;Demonstration;Tactile cues    Comprehension Verbalized understanding;Returned demonstration;Need further instruction               OT Long Term Goals - 09/23/20 1323      OT LONG TERM GOAL #1   Title Patient will improve gait speed and endurance to be able to ambulate 900 feet in 6 minutes to negotiate around the home and community safely in 4 weeks.    Baseline 740 feet at eval, 10th visit 745 feet and had to rest up to 1 min during testing.  17th visit 760 feet    Time 4    Period Weeks    Status On-going    Target Date 10/23/20      OT LONG TERM GOAL #2   Title Patient will complete HEP for maximal daily exercises with min assist in 4 weeks.    Baseline no current program at eval, 10th visit-becoming more familiar with HEP and with assist from  wife, 17th min assist but requires moderate cues at times    Time 4    Period Weeks    Status On-going    Target Date 10/23/20      OT LONG TERM GOAL #3   Title Patient will transfer from to sit to stand without the use of arms safely and independently from a variety of chairs/surfaces in 4 weeks.    Baseline difficulty with low surfaces, 10th visit: still has difficulty from lower surfaces such as couch    Time 4    Period Weeks    Status Achieved      OT LONG TERM GOAL #4   Title Will assess BERG balance next session    Baseline unable to perform at eval due to time constraints, patient has functional balance deficits during eval    Time 1    Period Weeks    Status Achieved      OT LONG TERM GOAL #5   Title Patient will complete light meal prep with supervision.    Baseline unable at eval, 10th visit:  continues to require assist from wife    Time 4    Period Weeks    Status Not Met    Target Date 10/23/20      OT LONG TERM GOAL #6   Title Patient will demonstrate improvements in handwriting with legiblity greater than 80% for printing and signing name on important papers.    Baseline 50% legiblity at eval, mircrographia noted. 10th visit:  improved to 70%    Time 4    Period Weeks    Status Partially Met    Target Date 10/23/20      OT LONG TERM GOAL #7   Title Pt will improve fine motor coordination skills to manage buttons on clothing with modified independence.    Baseline difficulty with buttons and requires moderate assist.    Time 6    Period Weeks    Status On-going    Target Date 10/23/20                 Plan - 10/01/20 1323    Clinical Impression Statement Pt alert, cooperative today, did not appear to be confused today.  He recognized therapist and knew he was here for exercises and was ready and agreeable to work with therapist.  Pt did require min cues to initiate all tasks and repeated directions for task  with shape tower reaching.  Transfers  improved from all surfaces but still had difficulty with initial sit to stand from transport chair, I believe this is because pt has to turn to the side to get up from this chair versus a regular chair.  Posture in forward flexion with ambulation and able to correct with verbal and tactile cues/therapist demo.    OT Occupational Profile and History Detailed Assessment- Review of Records and additional review of physical, cognitive, psychosocial history related to current functional performance    Occupational performance deficits (Please refer to evaluation for details): ADL's;IADL's;Play;Leisure;Social Participation    Body Structure / Function / Physical Skills ADL;Dexterity;Flexibility;ROM;Strength;Balance;Coordination;FMC;IADL;Body mechanics;Gait;Endurance;UE functional use;Decreased knowledge of use of DME;GMC;Mobility    Cognitive Skills Attention;Memory;Sequencing    Rehab Potential Good    Clinical Decision Making Limited treatment options, no task modification necessary    Comorbidities Affecting Occupational Performance: Presence of comorbidities impacting occupational performance    Comorbidities impacting occupational performance description: dementia, chronic afib, h/o seizures, CVA, h/o back surgery/pain, HTN, anklyosing spondylitis    Modification or Assistance to Complete Evaluation  No modification of tasks or assist necessary to complete eval    OT Frequency 2x / week    OT Duration 6 weeks    OT Treatment/Interventions Self-care/ADL training;Cryotherapy;Therapeutic exercise;DME and/or AE instruction;Functional Mobility Training;Cognitive remediation/compensation;Balance training;Neuromuscular education;Gait Training;Manual Therapy;Moist Heat;Therapeutic activities;Patient/family education;Stair Training    Consulted and Agree with Plan of Care Patient;Family member/caregiver    Family Member Consulted Belenda Cruise, wife           Patient will benefit from skilled therapeutic  intervention in order to improve the following deficits and impairments:   Body Structure / Function / Physical Skills: ADL,Dexterity,Flexibility,ROM,Strength,Balance,Coordination,FMC,IADL,Body mechanics,Gait,Endurance,UE functional use,Decreased knowledge of use of DME,GMC,Mobility Cognitive Skills: Attention,Memory,Sequencing     Visit Diagnosis: Unsteadiness on feet  Muscle weakness (generalized)  Difficulty in walking, not elsewhere classified  Other lack of coordination  Abnormal posture  Cognitive communication deficit    Problem List Patient Active Problem List   Diagnosis Date Noted  . Raynaud phenomenon 03/22/2020  . Chronic venous insufficiency 03/22/2020  . B12 deficiency 03/10/2020  . Acute ischemic multifocal anterior circulation stroke ([place]) 03/09/2020  . Pseudophakia of left eye 07/02/2018  . Obesity, Class I, BMI 30-34.9 02/15/2018  . Closed displaced fracture of distal phalanx of left middle finger 03/08/2017  . Closed fracture of distal end of left radius with routine healing 01/16/2017  . Closed fracture of nasal bone with routine healing 01/12/2017  . Ankylosing spondylitis ([place]) 12/19/2016  . Closed fracture of dorsal (thoracic) vertebra ([place]) 12/19/2016  . Fall 12/19/2016  . Fracture of left radius 12/19/2016  . Primary osteoarthritis of right knee 10/14/2015  . Acute pain of right knee 09/09/2015  . Aftercare following joint replacement surgery 07/31/2015  . Closed nondisplaced fracture of lateral condyle of femur ([place]) 07/31/2015  . Bilateral edema of lower extremity 06/26/2014  . Chronic atrial fibrillation ([place]) 07/13/2013  . Wound disruption, post-op, skin 10/11/2012  . Cellulitis and abscess 10/05/2012  . Hyperlipidemia 08/17/2012  . Osteoarthritis 08/17/2012  . Seizure disorder ([place]) 08/16/2012  . Hip pain 12/12/2011  . 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 CAD (coronary artery disease) 07/07/2011  . Diabetes mellitus, type 2 ([place])  07/07/2011  . HTN (hypertension) 07/07/2011   Airik Goodlin T Tomasita Morrow, OTR/L, CLT  Linley Moxley 10/01/2020, 1:28 PM  [place] MAIN [facility] SERVICES [address] [place], [place], [address] Phone: [phone]   Fax:  [phone]  Name: Ian King MRN: [id number] Date of Birth: 1941-05-17

## 2020-10-06 ENCOUNTER — Other Ambulatory Visit: Payer: Self-pay

## 2020-10-06 ENCOUNTER — Encounter: Payer: Medicare PPO | Admitting: Speech Pathology

## 2020-10-06 ENCOUNTER — Ambulatory Visit: Payer: Medicare PPO | Attending: Neurology | Admitting: Occupational Therapy

## 2020-10-06 DIAGNOSIS — M6281 Muscle weakness (generalized): Secondary | ICD-10-CM

## 2020-10-06 DIAGNOSIS — R278 Other lack of coordination: Secondary | ICD-10-CM | POA: Diagnosis present

## 2020-10-06 DIAGNOSIS — R41841 Cognitive communication deficit: Secondary | ICD-10-CM | POA: Diagnosis present

## 2020-10-06 DIAGNOSIS — R262 Difficulty in walking, not elsewhere classified: Secondary | ICD-10-CM

## 2020-10-06 DIAGNOSIS — R293 Abnormal posture: Secondary | ICD-10-CM

## 2020-10-06 DIAGNOSIS — R2681 Unsteadiness on feet: Secondary | ICD-10-CM | POA: Diagnosis present

## 2020-10-08 ENCOUNTER — Encounter: Payer: Medicare PPO | Admitting: Occupational Therapy

## 2020-10-08 ENCOUNTER — Encounter: Payer: Medicare PPO | Admitting: Speech Pathology

## 2020-10-09 ENCOUNTER — Encounter: Payer: Self-pay | Admitting: Occupational Therapy

## 2020-10-09 ENCOUNTER — Other Ambulatory Visit (HOSPITAL_COMMUNITY): Payer: Self-pay

## 2020-10-09 NOTE — Therapy (Signed)
[place] MAIN [facility] SERVICES [address] [place], [place], [address] Phone: [phone]   Fax:  [phone]  Occupational Therapy Treatment  Patient Details  Name: Ian King MRN: [id number] Date of Birth: October 01, 1941 No data recorded  Encounter Date: 10/06/2020   OT End of Session - 10/09/20 1313    Visit Number 24    Number of Visits 29    Date for OT Re-Evaluation 10/23/20    OT Start Time 1109    OT Stop Time 1200    OT Time Calculation (min) 51 min    Activity Tolerance Patient tolerated treatment well    Behavior During Therapy [facility] for tasks assessed/performed           Past Medical History:  Diagnosis Date  . A-fib ([place])   . Diabetes mellitus without complication ([place])   . Hypertension     Past Surgical History:  Procedure Laterality Date  . arm surgery    . BACK SURGERY    . JOINT REPLACEMENT     hip  . JOINT REPLACEMENT Right    knee  . SPINE SURGERY      There were no vitals filed for this visit.   Subjective Assessment - 10/09/20 1312    Subjective  Patient doing well, recognizes therapist and ready to get started and willing to walk to clinic from waiting room.    Patient Stated Goals Patient reports he would like to be as independent as he can for himself.  "I want to do as much as I can."    Currently in Pain? No/denies    Pain Score 0-No pain            Pt reports he is doing well, no complaints during therapy.    Pt seen for reaching tasks from seated position with use of SAEBO multi level tower with looped balls.  Utilized a 1.5# wrist weight on the right UE to complete 2 rounds of 4 levels each, difficulty with top level but able to complete with extended reach pattern and weight shifting.  Patient performing Resistive pinch pins from seated position, placing onto small dowels presented in multi directional reaching patterns.  Able to complete the first 4 levels of resistance for pinch, yellow,  red, green and blue with RUE and LUE, greater difficulty with left than right.      Patient seen for upper body strengthening with use of UBE with resistance of 1.0 to 2.0 for 8 mins, forwards, backwards with therapist in constant attendance to adjust settings and to ensure grip.    Sit to stand for 5 reps, cues for forward weight shift and proper technique.    Functional mobility for 3 trials of 125 feet with use of cane and therapist providing min guard.  Response to tx:  Patient continues to progress, remains limited with left UE ROM and strength but performing well with tasks in the clinic.  Following commands consistently today but often requires directions to be repeated and therapist demo.  Will plan to reassess next visit to determine progress, update goals and determine further plan of care.                      OT Education - 10/09/20 1313    Education provided Yes    Education Details safety, cues, exercises, mobility    Person(s) Educated Patient;Spouse    Methods Explanation;Demonstration;Tactile cues    Comprehension Verbalized understanding;Returned demonstration;Need  further instruction               OT Long Term Goals - 09/23/20 1323      OT LONG TERM GOAL #1   Title Patient will improve gait speed and endurance to be able to ambulate 900 feet in 6 minutes to negotiate around the home and community safely in 4 weeks.    Baseline 740 feet at eval, 10th visit 745 feet and had to rest up to 1 min during testing.  17th visit 760 feet    Time 4    Period Weeks    Status On-going    Target Date 10/23/20      OT LONG TERM GOAL #2   Title Patient will complete HEP for maximal daily exercises with min assist in 4 weeks.    Baseline no current program at eval, 10th visit-becoming more familiar with HEP and with assist from wife, 17th min assist but requires moderate cues at times    Time 4    Period Weeks    Status On-going    Target Date 10/23/20       OT LONG TERM GOAL #3   Title Patient will transfer from to sit to stand without the use of arms safely and independently from a variety of chairs/surfaces in 4 weeks.    Baseline difficulty with low surfaces, 10th visit: still has difficulty from lower surfaces such as couch    Time 4    Period Weeks    Status Achieved      OT LONG TERM GOAL #4   Title Will assess BERG balance next session    Baseline unable to perform at eval due to time constraints, patient has functional balance deficits during eval    Time 1    Period Weeks    Status Achieved      OT LONG TERM GOAL #5   Title Patient will complete light meal prep with supervision.    Baseline unable at eval, 10th visit:  continues to require assist from wife    Time 4    Period Weeks    Status Not Met    Target Date 10/23/20      OT LONG TERM GOAL #6   Title Patient will demonstrate improvements in handwriting with legiblity greater than 80% for printing and signing name on important papers.    Baseline 50% legiblity at eval, mircrographia noted. 10th visit:  improved to 70%    Time 4    Period Weeks    Status Partially Met    Target Date 10/23/20      OT LONG TERM GOAL #7   Title Pt will improve fine motor coordination skills to manage buttons on clothing with modified independence.    Baseline difficulty with buttons and requires moderate assist.    Time 6    Period Weeks    Status On-going    Target Date 10/23/20                 Plan - 10/09/20 1314    Clinical Impression Statement Patient continues to progress, remains limited with left UE ROM and strength but performing well with tasks in the clinic.  Following commands consistently today but often requires directions to be repeated and therapist demo.  Will plan to reassess next visit to determine progress, update goals and determine further plan of care.    OT Occupational Profile and History Detailed Assessment- Review of Records and additional review  of physical, cognitive, psychosocial history related to current functional performance    Occupational performance deficits (Please refer to evaluation for details): ADL's;IADL's;Play;Leisure;Social Participation    Body Structure / Function / Physical Skills ADL;Dexterity;Flexibility;ROM;Strength;Balance;Coordination;FMC;IADL;Body mechanics;Gait;Endurance;UE functional use;Decreased knowledge of use of DME;GMC;Mobility    Cognitive Skills Attention;Memory;Sequencing    Rehab Potential Good    Clinical Decision Making Limited treatment options, no task modification necessary    Comorbidities Affecting Occupational Performance: Presence of comorbidities impacting occupational performance    Comorbidities impacting occupational performance description: dementia, chronic afib, h/o seizures, CVA, h/o back surgery/pain, HTN, anklyosing spondylitis    Modification or Assistance to Complete Evaluation  No modification of tasks or assist necessary to complete eval    OT Frequency 2x / week    OT Duration 6 weeks    OT Treatment/Interventions Self-care/ADL training;Cryotherapy;Therapeutic exercise;DME and/or AE instruction;Functional Mobility Training;Cognitive remediation/compensation;Balance training;Neuromuscular education;Gait Training;Manual Therapy;Moist Heat;Therapeutic activities;Patient/family education;Stair Training    Consulted and Agree with Plan of Care Patient;Family member/caregiver    Family Member Consulted Belenda Cruise, wife           Patient will benefit from skilled therapeutic intervention in order to improve the following deficits and impairments:   Body Structure / Function / Physical Skills: ADL,Dexterity,Flexibility,ROM,Strength,Balance,Coordination,FMC,IADL,Body mechanics,Gait,Endurance,UE functional use,Decreased knowledge of use of DME,GMC,Mobility Cognitive Skills: Attention,Memory,Sequencing     Visit Diagnosis: Muscle weakness (generalized)  Unsteadiness on  feet  Difficulty in walking, not elsewhere classified  Other lack of coordination  Abnormal posture  Cognitive communication deficit    Problem List Patient Active Problem List   Diagnosis Date Noted  . Raynaud phenomenon 03/22/2020  . Chronic venous insufficiency 03/22/2020  . B12 deficiency 03/10/2020  . Acute ischemic multifocal anterior circulation stroke ([place]) 03/09/2020  . Pseudophakia of left eye 07/02/2018  . Obesity, Class I, BMI 30-34.9 02/15/2018  . Closed displaced fracture of distal phalanx of left middle finger 03/08/2017  . Closed fracture of distal end of left radius with routine healing 01/16/2017  . Closed fracture of nasal bone with routine healing 01/12/2017  . Ankylosing spondylitis ([place]) 12/19/2016  . Closed fracture of dorsal (thoracic) vertebra ([place]) 12/19/2016  . Fall 12/19/2016  . Fracture of left radius 12/19/2016  . Primary osteoarthritis of right knee 10/14/2015  . Acute pain of right knee 09/09/2015  . Aftercare following joint replacement surgery 07/31/2015  . Closed nondisplaced fracture of lateral condyle of femur ([place]) 07/31/2015  . Bilateral edema of lower extremity 06/26/2014  . Chronic atrial fibrillation ([place]) 07/13/2013  . Wound disruption, post-op, skin 10/11/2012  . Cellulitis and abscess 10/05/2012  . Hyperlipidemia 08/17/2012  . Osteoarthritis 08/17/2012  . Seizure disorder ([place]) 08/16/2012  . Hip pain 12/12/2011  . 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 CAD (coronary artery disease) 07/07/2011  . Diabetes mellitus, type 2 ([place]) 07/07/2011  . HTN (hypertension) 07/07/2011   Inocencio Roy T Tomasita Morrow, OTR/L, CLT  Nikolis Berent 10/09/2020, 1:26 PM  [place] MAIN [facility] SERVICES [address] [place], [place], [address] Phone: [phone]   Fax:  [phone]  Name: DEMARIS LEAVELL MRN: [id number] Date of Birth: 1941/03/16

## 2020-10-13 ENCOUNTER — Encounter: Payer: Medicare PPO | Admitting: Speech Pathology

## 2020-10-15 ENCOUNTER — Ambulatory Visit: Payer: Medicare PPO | Admitting: Speech Pathology

## 2020-10-20 ENCOUNTER — Ambulatory Visit: Payer: Medicare PPO | Admitting: Speech Pathology

## 2020-10-22 ENCOUNTER — Encounter: Payer: Medicare PPO | Admitting: Occupational Therapy

## 2020-10-22 ENCOUNTER — Ambulatory Visit: Payer: Medicare PPO | Admitting: Speech Pathology

## 2020-10-27 ENCOUNTER — Ambulatory Visit: Payer: Medicare PPO | Admitting: Speech Pathology

## 2020-10-29 ENCOUNTER — Ambulatory Visit: Payer: Medicare PPO | Admitting: Speech Pathology

## 2020-11-21 ENCOUNTER — Emergency Department: Payer: Medicare PPO

## 2020-11-21 ENCOUNTER — Other Ambulatory Visit: Payer: Self-pay

## 2020-11-21 ENCOUNTER — Emergency Department
Admission: EM | Admit: 2020-11-21 | Discharge: 2020-11-21 | Disposition: A | Discharge status: Home / Self Care | Payer: Medicare PPO | Attending: Emergency Medicine | Admitting: Emergency Medicine

## 2020-11-21 DIAGNOSIS — S0031XA Abrasion of nose, initial encounter: Secondary | ICD-10-CM | POA: Insufficient documentation

## 2020-11-21 DIAGNOSIS — I2510 Atherosclerotic heart disease of native coronary artery without angina pectoris: Secondary | ICD-10-CM | POA: Insufficient documentation

## 2020-11-21 DIAGNOSIS — E1169 Type 2 diabetes mellitus with other specified complication: Secondary | ICD-10-CM | POA: Insufficient documentation

## 2020-11-21 DIAGNOSIS — W108XXA Fall (on) (from) other stairs and steps, initial encounter: Secondary | ICD-10-CM | POA: Diagnosis not present

## 2020-11-21 DIAGNOSIS — I482 Chronic atrial fibrillation, unspecified: Secondary | ICD-10-CM | POA: Insufficient documentation

## 2020-11-21 DIAGNOSIS — Z7984 Long term (current) use of oral hypoglycemic drugs: Secondary | ICD-10-CM | POA: Diagnosis not present

## 2020-11-21 DIAGNOSIS — Z87891 Personal history of nicotine dependence: Secondary | ICD-10-CM | POA: Diagnosis not present

## 2020-11-21 DIAGNOSIS — Z79899 Other long term (current) drug therapy: Secondary | ICD-10-CM | POA: Insufficient documentation

## 2020-11-21 DIAGNOSIS — G3183 Dementia with Lewy bodies: Secondary | ICD-10-CM | POA: Insufficient documentation

## 2020-11-21 DIAGNOSIS — Y92017 Garden or yard in single-family (private) house as the place of occurrence of the external cause: Secondary | ICD-10-CM | POA: Insufficient documentation

## 2020-11-21 DIAGNOSIS — I10 Essential (primary) hypertension: Secondary | ICD-10-CM | POA: Diagnosis not present

## 2020-11-21 DIAGNOSIS — S0001XA Abrasion of scalp, initial encounter: Secondary | ICD-10-CM | POA: Diagnosis not present

## 2020-11-21 DIAGNOSIS — Z7901 Long term (current) use of anticoagulants: Secondary | ICD-10-CM | POA: Insufficient documentation

## 2020-11-21 DIAGNOSIS — W19XXXA Unspecified fall, initial encounter: Secondary | ICD-10-CM

## 2020-11-21 DIAGNOSIS — Z96651 Presence of right artificial knee joint: Secondary | ICD-10-CM | POA: Diagnosis not present

## 2020-11-21 DIAGNOSIS — S0990XA Unspecified injury of head, initial encounter: Secondary | ICD-10-CM | POA: Diagnosis present

## 2020-11-21 DIAGNOSIS — Z96649 Presence of unspecified artificial hip joint: Secondary | ICD-10-CM | POA: Diagnosis not present

## 2020-11-21 DIAGNOSIS — F0281 Dementia in other diseases classified elsewhere with behavioral disturbance: Secondary | ICD-10-CM

## 2020-11-21 LAB — BASIC METABOLIC PANEL
Anion gap: 10 (ref 5–15)
BUN: 15 mg/dL (ref 8–23)
CO2: 28 mmol/L (ref 22–32)
Calcium: 9.9 mg/dL (ref 8.9–10.3)
Chloride: 103 mmol/L (ref 98–111)
Creatinine, Ser: 0.87 mg/dL (ref 0.61–1.24)
GFR, Estimated: >60 mL/min (ref >60)
Glucose, Bld: 126 mg/dL — ABNORMAL HIGH (ref 70–99)
Potassium: 3.7 mmol/L (ref 3.5–5.1)
Sodium: 141 mmol/L (ref 135–145)

## 2020-11-21 LAB — CBC WITH DIFFERENTIAL/PLATELET
Abs Immature Granulocytes: 0.03 10*3/uL (ref 0.00–0.07)
Basophils Absolute: 0 10*3/uL (ref 0.0–0.1)
Basophils Relative: 0 %
Eosinophils Absolute: 0.1 10*3/uL (ref 0.0–0.5)
Eosinophils Relative: 1 %
HCT: 40.7 % (ref 39.0–52.0)
Hemoglobin: 13.5 g/dL (ref 13.0–17.0)
Immature Granulocytes: 0 %
Lymphocytes Relative: 15 %
Lymphs Abs: 1.1 10*3/uL (ref 0.7–4.0)
MCH: 32.1 pg (ref 26.0–34.0)
MCHC: 33.2 g/dL (ref 30.0–36.0)
MCV: 96.9 fL (ref 80.0–100.0)
Monocytes Absolute: 0.5 10*3/uL (ref 0.1–1.0)
Monocytes Relative: 7 %
Neutro Abs: 5.4 10*3/uL (ref 1.7–7.7)
Neutrophils Relative %: 77 %
Platelets: 151 10*3/uL (ref 150–400)
RBC: 4.2 MIL/uL — ABNORMAL LOW (ref 4.22–5.81)
RDW: 13.2 % (ref 11.5–15.5)
WBC: 7.1 10*3/uL (ref 4.0–10.5)
nRBC: 0 % (ref 0.0–0.2)

## 2020-11-21 NOTE — ED Triage Notes (Signed)
Pt via ems. Pt had a fall at home out in the front yard. Wife found him down. Pt with abrasion to right forehead. Complains of pain on left shoulder. Pt reports being on a blood thinner.

## 2020-11-21 NOTE — ED Provider Notes (Signed)
[facility] Emergency Department Provider Note   ____________________________________________   Event Date/Time   First MD Initiated Contact with Patient 11/21/20 1918     (approximate)  I have reviewed the triage vital signs and the nursing notes.   HISTORY  Chief Complaint Fall    HPI Ian King is a 80 y.o. male with past medical history of hypertension, diabetes, chronic atrial fibrillation on Xarelto, CAD, dementia, seizures, and stroke who presents to the ED for fall.  History is limited due to patient's baseline dementia, patient states he thinks that he fell on the front steps of his house, but is unable to state exactly how he fell.  He reports hitting his head and is not sure whether he lost consciousness.  He now complains of headache, neck pain, and left arm pain.  He states the pain is especially bad in his left shoulder, but also affects his left elbow and wrist.  He denies any pain in his right arm or his bilateral lower extremities.  He was found by his wife outside on the ground, who then called EMS.        Past Medical History:  Diagnosis Date  . A-fib ([facility])   . Diabetes mellitus without complication ([facility])   . Hypertension     Patient Active Problem List   Diagnosis Date Noted  . Raynaud phenomenon 03/22/2020  . Chronic venous insufficiency 03/22/2020  . B12 deficiency 03/10/2020  . 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 Pseudophakia of left eye 07/02/2018  . Obesity, Class I, BMI 30-34.9 02/15/2018  . Closed displaced fracture of distal phalanx of left middle finger 03/08/2017  . Closed fracture of distal end of left radius with routine healing 01/16/2017  . Closed fracture of nasal bone with routine healing 01/12/2017  . 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  . Closed fracture of dorsal (thoracic) vertebra ([facility]) 12/19/2016  . Fall 12/19/2016  . Fracture of left radius 12/19/2016  .  Primary osteoarthritis of right knee 10/14/2015  . Acute pain of right knee 09/09/2015  . Aftercare following joint replacement surgery 07/31/2015  . Closed nondisplaced fracture of lateral condyle of femur ([facility]) 07/31/2015  . Bilateral edema of lower extremity 06/26/2014  . Chronic atrial fibrillation ([facility]) 07/13/2013  . Wound disruption, post-op, skin 10/11/2012  . Cellulitis and abscess 10/05/2012  . Hyperlipidemia 08/17/2012  . Osteoarthritis 08/17/2012  . Seizure disorder ([facility]) 08/16/2012  . Hip pain 12/12/2011  . 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 CAD (coronary artery disease) 07/07/2011  . Diabetes mellitus, type 2 ([facility]) 07/07/2011  . HTN (hypertension) 07/07/2011    Past Surgical History:  Procedure Laterality Date  . arm surgery    . BACK SURGERY    . JOINT REPLACEMENT     hip  . JOINT REPLACEMENT Right    knee  . SPINE SURGERY      Prior to Admission medications   Medication Sig Start Date End Date Taking? Authorizing Provider  amLODipine (NORVASC) 10 MG tablet Take 10 mg by mouth daily. Patient not taking: Reported on 03/19/2020    [provider]  atorvastatin (LIPITOR) 40 MG tablet Take 40 mg by mouth daily.    [provider]  bacitracin ointment Apply 1 application topically 2 (two) times daily. Patient not taking: Reported on 03/19/2020    [provider]  cyanocobalamin 1000 MCG tablet Take by mouth. 03/11/20 03/11/21  [provider]  desonide (DESOWEN) 0.05 % cream  Apply topically 2 (two) times daily. Patient not taking: Reported on 03/19/2020    [provider]  furosemide (LASIX) 20 MG tablet Take 20 mg by mouth 4 (four) times a week.     [provider]  ketoconazole (NIZORAL) 2 % shampoo Apply 1 application topically 2 (two) times a week. Patient not taking: Reported on 03/19/2020    [provider]  levETIRAcetam (KEPPRA) 1000 MG tablet  03/10/20   [provider]  metFORMIN  (GLUCOPHAGE-XR) 500 MG 24 hr tablet Take 1,000 mg by mouth in the morning and at bedtime.     [provider]  metoprolol tartrate (LOPRESSOR) 25 MG tablet Take 25 mg by mouth 2 (two) times daily.    [provider]  Omega-3 Fatty Acids (FISH OIL) 1000 MG CAPS Take by mouth.    [provider]  polyethylene glycol powder (GLYCOLAX/MIRALAX) 17 GM/SCOOP powder Take by mouth. 04/12/19   [provider]  ramipril (ALTACE) 10 MG capsule Take 10 mg by mouth daily.    [provider]  rivaroxaban (XARELTO) 20 MG TABS tablet Take by mouth. 01/21/20   [provider]  senna-docusate (SENOKOT-S) 8.6-50 MG tablet Take 1 tablet by mouth daily. Patient not taking: Reported on 03/19/2020    [provider]    Allergies Lovastatin  Family History  Problem Relation Age of Onset  . Heart disease Mother   . Heart disease Father   . Heart disease Sister   . Leukemia Brother   . Heart disease Brother   . Diabetes Brother     Social History Social History   Tobacco Use  . Smoking status: Former Smoker    Types: Cigars  . Smokeless tobacco: Never Used  Substance Use Topics  . Alcohol use: Never  . Drug use: Never    Review of Systems  Constitutional: No fever/chills Eyes: No visual changes. ENT: No sore throat. Cardiovascular: Denies chest pain. Respiratory: Denies shortness of breath. Gastrointestinal: No abdominal pain.  No nausea, no vomiting.  No diarrhea.  No constipation. Genitourinary: Negative for dysuria. Musculoskeletal: Negative for back pain.  Positive for left arm pain.  Positive for neck pain. Skin: Negative for rash. Neurological: Positive for headaches, negative for focal weakness or numbness.  ____________________________________________   PHYSICAL EXAM:  VITAL SIGNS: ED Triage Vitals  Enc Vitals Group     BP      Pulse      Resp      Temp      Temp src      SpO2      Weight      Height      Head  Circumference      Peak Flow      Pain Score      Pain Loc      Pain Edu?      Excl. in GC?     Constitutional: Alert and oriented. Eyes: Conjunctivae are normal. Head: Abrasions to frontal scalp and bridge of nose.  No facial bony step-offs or obvious deformities.  No hematomas or lacerations noted. Nose: No congestion/rhinnorhea. Mouth/Throat: Mucous membranes are moist. Neck: Normal ROM Cardiovascular: Normal rate, regular rhythm. Grossly normal heart sounds.  2+ radial pulses bilaterally. Respiratory: Normal respiratory effort.  No retractions. Lungs CTAB. Gastrointestinal: Soft and nontender. No distention. Genitourinary: deferred Musculoskeletal: No lower extremity tenderness nor edema.  Diffuse tenderness to left arm including shoulder, elbow, and wrist with no obvious deformities. Neurologic:  Normal speech  and language. No gross focal neurologic deficits are appreciated. Skin:  Skin is warm, dry and intact. No rash noted. Psychiatric: Mood and affect are normal. Speech and behavior are normal.  ____________________________________________   LABS (all labs ordered are listed, but only abnormal results are displayed)  Labs Reviewed  CBC WITH DIFFERENTIAL/PLATELET - Abnormal; Notable for the following components:      Result Value   RBC 4.20 (*)    All other components within normal limits  BASIC METABOLIC PANEL - Abnormal; Notable for the following components:   Glucose, Bld 126 (*)    All other components within normal limits   ____________________________________________  EKG  ED ECG REPORT I, Chesley Noon, the attending physician, personally viewed and interpreted this ECG.   Date: 11/21/2020  EKG Time: 19:41  Rate: 78  Rhythm: atrial fibrillation, rate 78  Axis: RAD  Intervals:none  ST&T Change: None   PROCEDURES  Procedure(s) performed (including Critical Care):  Procedures   ____________________________________________   INITIAL IMPRESSION  / ASSESSMENT AND PLAN / ED COURSE       80 year old male with past medical history of hypertension, diabetes, chronic atrial fibrillation on Xarelto, stroke, CAD, dementia, and seizures who presents to the ED for unwitnessed fall on the steps of his front house.  He is awake and alert on arrival here to the ED, appears to be at his baseline mental status with no focal deficits.  He does complain of pain throughout his left arm with no obvious deformities.  We will further assess with x-rays of his left shoulder, elbow, wrist, as well as CT scans of head and cervical spine.  We will screen EKG and labs given his cardiac history and history of seizures.  EKG and labs are unremarkable, patient has remained at his baseline mental status during time in the ED.  CT head is negative for acute traumatic injury, does show basal ganglion lesions consistent with his history of Lewy body dementia.  CT cervical spine is also negative for acute fracture, does show chronic arthropathy.  X-rays of left shoulder, elbow, and wrist are negative for acute fracture, do show old fracture that wife was previously aware of.  Patient is appropriate for discharge home with PCP and neurology follow-up, was counseled to return to the ED for new or worsening symptoms.  Patient and wife agree with plan.      ____________________________________________   FINAL CLINICAL IMPRESSION(S) / ED DIAGNOSES  Final diagnoses:  Fall, initial encounter  Lewy body dementia with behavioral disturbance [facility])     ED Discharge Orders    None       Note:  This document was prepared using Dragon voice recognition software and may include unintentional dictation errors.   Chesley Noon, MD 11/21/20 2308

## 2020-11-21 NOTE — ED Notes (Signed)
Patient transported to CT 

## 2021-01-16 ENCOUNTER — Other Ambulatory Visit: Payer: Self-pay

## 2021-01-16 ENCOUNTER — Inpatient Hospital Stay
Admission: EM | Admit: 2021-01-16 | Discharge: 2021-01-22 | DRG: 064 | Disposition: A | Discharge status: Skilled Nursing Facility | Payer: Medicare PPO | Attending: Internal Medicine | Admitting: Internal Medicine

## 2021-01-16 ENCOUNTER — Emergency Department: Payer: Medicare PPO

## 2021-01-16 DIAGNOSIS — G20 Parkinson's disease: Secondary | ICD-10-CM

## 2021-01-16 DIAGNOSIS — F0281 Dementia in other diseases classified elsewhere with behavioral disturbance: Secondary | ICD-10-CM | POA: Diagnosis present

## 2021-01-16 DIAGNOSIS — Z20822 Contact with and (suspected) exposure to covid-19: Secondary | ICD-10-CM | POA: Diagnosis present

## 2021-01-16 DIAGNOSIS — I5032 Chronic diastolic (congestive) heart failure: Secondary | ICD-10-CM

## 2021-01-16 DIAGNOSIS — I6381 Other cerebral infarction due to occlusion or stenosis of small artery: Secondary | ICD-10-CM | POA: Diagnosis not present

## 2021-01-16 DIAGNOSIS — R1310 Dysphagia, unspecified: Secondary | ICD-10-CM

## 2021-01-16 DIAGNOSIS — R4182 Altered mental status, unspecified: Secondary | ICD-10-CM

## 2021-01-16 DIAGNOSIS — Z66 Do not resuscitate: Secondary | ICD-10-CM | POA: Diagnosis present

## 2021-01-16 DIAGNOSIS — Z833 Family history of diabetes mellitus: Secondary | ICD-10-CM

## 2021-01-16 DIAGNOSIS — F05 Delirium due to known physiological condition: Secondary | ICD-10-CM | POA: Diagnosis not present

## 2021-01-16 DIAGNOSIS — I4891 Unspecified atrial fibrillation: Secondary | ICD-10-CM | POA: Diagnosis not present

## 2021-01-16 DIAGNOSIS — I7300 Raynaud's syndrome without gangrene: Secondary | ICD-10-CM | POA: Diagnosis present

## 2021-01-16 DIAGNOSIS — I2510 Atherosclerotic heart disease of native coronary artery without angina pectoris: Secondary | ICD-10-CM | POA: Diagnosis present

## 2021-01-16 DIAGNOSIS — E785 Hyperlipidemia, unspecified: Secondary | ICD-10-CM

## 2021-01-16 DIAGNOSIS — G40909 Epilepsy, unspecified, not intractable, without status epilepticus: Secondary | ICD-10-CM | POA: Diagnosis not present

## 2021-01-16 DIAGNOSIS — M109 Gout, unspecified: Secondary | ICD-10-CM | POA: Diagnosis not present

## 2021-01-16 DIAGNOSIS — Z888 Allergy status to other drugs, medicaments and biological substances status: Secondary | ICD-10-CM

## 2021-01-16 DIAGNOSIS — I639 Cerebral infarction, unspecified: Secondary | ICD-10-CM | POA: Diagnosis present

## 2021-01-16 DIAGNOSIS — Z7984 Long term (current) use of oral hypoglycemic drugs: Secondary | ICD-10-CM

## 2021-01-16 DIAGNOSIS — Z8673 Personal history of transient ischemic attack (TIA), and cerebral infarction without residual deficits: Secondary | ICD-10-CM

## 2021-01-16 DIAGNOSIS — I872 Venous insufficiency (chronic) (peripheral): Secondary | ICD-10-CM | POA: Diagnosis present

## 2021-01-16 DIAGNOSIS — Z8249 Family history of ischemic heart disease and other diseases of the circulatory system: Secondary | ICD-10-CM

## 2021-01-16 DIAGNOSIS — Z7901 Long term (current) use of anticoagulants: Secondary | ICD-10-CM

## 2021-01-16 DIAGNOSIS — Z9183 Wandering in diseases classified elsewhere: Secondary | ICD-10-CM

## 2021-01-16 DIAGNOSIS — M25562 Pain in left knee: Secondary | ICD-10-CM | POA: Diagnosis not present

## 2021-01-16 DIAGNOSIS — M25462 Effusion, left knee: Secondary | ICD-10-CM | POA: Diagnosis not present

## 2021-01-16 DIAGNOSIS — E119 Type 2 diabetes mellitus without complications: Secondary | ICD-10-CM | POA: Diagnosis present

## 2021-01-16 DIAGNOSIS — F0280 Dementia in other diseases classified elsewhere without behavioral disturbance: Secondary | ICD-10-CM

## 2021-01-16 DIAGNOSIS — Z87891 Personal history of nicotine dependence: Secondary | ICD-10-CM

## 2021-01-16 DIAGNOSIS — L219 Seborrheic dermatitis, unspecified: Secondary | ICD-10-CM | POA: Diagnosis present

## 2021-01-16 DIAGNOSIS — Z881 Allergy status to other antibiotic agents status: Secondary | ICD-10-CM

## 2021-01-16 DIAGNOSIS — G309 Alzheimer's disease, unspecified: Secondary | ICD-10-CM | POA: Diagnosis present

## 2021-01-16 DIAGNOSIS — Z79899 Other long term (current) drug therapy: Secondary | ICD-10-CM

## 2021-01-16 DIAGNOSIS — Z96651 Presence of right artificial knee joint: Secondary | ICD-10-CM | POA: Diagnosis present

## 2021-01-16 DIAGNOSIS — G9341 Metabolic encephalopathy: Secondary | ICD-10-CM | POA: Diagnosis present

## 2021-01-16 DIAGNOSIS — I4821 Permanent atrial fibrillation: Secondary | ICD-10-CM | POA: Diagnosis present

## 2021-01-16 DIAGNOSIS — I110 Hypertensive heart disease with heart failure: Secondary | ICD-10-CM

## 2021-01-16 LAB — COMPREHENSIVE METABOLIC PANEL
ALT: 9 U/L (ref 0–44)
AST: 12 U/L — ABNORMAL LOW (ref 15–41)
Albumin: 4.1 g/dL (ref 3.5–5.0)
Alkaline Phosphatase: 76 U/L (ref 38–126)
Anion gap: 8 (ref 5–15)
BUN: 12 mg/dL (ref 8–23)
CO2: 29 mmol/L (ref 22–32)
Calcium: 9.9 mg/dL (ref 8.9–10.3)
Chloride: 103 mmol/L (ref 98–111)
Creatinine, Ser: 0.95 mg/dL (ref 0.61–1.24)
GFR, Estimated: >60 mL/min (ref >60)
Glucose, Bld: 121 mg/dL — ABNORMAL HIGH (ref 70–99)
Potassium: 3.9 mmol/L (ref 3.5–5.1)
Sodium: 140 mmol/L (ref 135–145)
Total Bilirubin: 1.1 mg/dL (ref 0.3–1.2)
Total Protein: 7.8 g/dL (ref 6.5–8.1)

## 2021-01-16 LAB — CBC
HCT: 40.1 % (ref 39.0–52.0)
Hemoglobin: 12.7 g/dL — ABNORMAL LOW (ref 13.0–17.0)
MCH: 31.6 pg (ref 26.0–34.0)
MCHC: 31.7 g/dL (ref 30.0–36.0)
MCV: 99.8 fL (ref 80.0–100.0)
Platelets: 147 10*3/uL — ABNORMAL LOW (ref 150–400)
RBC: 4.02 MIL/uL — ABNORMAL LOW (ref 4.22–5.81)
RDW: 12.9 % (ref 11.5–15.5)
WBC: 6.4 10*3/uL (ref 4.0–10.5)
nRBC: 0 % (ref 0.0–0.2)

## 2021-01-16 LAB — PROTIME-INR
INR: 2.4 — ABNORMAL HIGH (ref 0.8–1.2)
Prothrombin Time: 25.7 seconds — ABNORMAL HIGH (ref 11.4–15.2)

## 2021-01-16 LAB — URINALYSIS, COMPLETE (UACMP) WITH MICROSCOPIC
Bacteria, UA: NONE SEEN
Bilirubin Urine: NEGATIVE
Glucose, UA: NEGATIVE mg/dL
Ketones, ur: NEGATIVE mg/dL
Nitrite: NEGATIVE
Protein, ur: NEGATIVE mg/dL
Specific Gravity, Urine: 1.011 (ref 1.005–1.030)
pH: 6 (ref 5.0–8.0)

## 2021-01-16 LAB — DIFFERENTIAL
Abs Immature Granulocytes: 0.02 10*3/uL (ref 0.00–0.07)
Basophils Absolute: 0 10*3/uL (ref 0.0–0.1)
Basophils Relative: 1 %
Eosinophils Absolute: 0.2 10*3/uL (ref 0.0–0.5)
Eosinophils Relative: 3 %
Immature Granulocytes: 0 %
Lymphocytes Relative: 15 %
Lymphs Abs: 1 10*3/uL (ref 0.7–4.0)
Monocytes Absolute: 0.6 10*3/uL (ref 0.1–1.0)
Monocytes Relative: 9 %
Neutro Abs: 4.7 10*3/uL (ref 1.7–7.7)
Neutrophils Relative %: 72 %

## 2021-01-16 LAB — GLUCOSE, CAPILLARY
Glucose-Capillary: 95 mg/dL (ref 70–99)
Glucose-Capillary: 96 mg/dL (ref 70–99)

## 2021-01-16 LAB — PROCALCITONIN: Procalcitonin: <0.1 ng/mL

## 2021-01-16 LAB — TSH: TSH: 1.117 u[IU]/mL (ref 0.350–4.500)

## 2021-01-16 LAB — CBG MONITORING, ED: Glucose-Capillary: 114 mg/dL — ABNORMAL HIGH (ref 70–99)

## 2021-01-16 LAB — RESP PANEL BY RT-PCR (FLU A&B, COVID) ARPGX2
Influenza A by PCR: NEGATIVE
Influenza B by PCR: NEGATIVE
SARS Coronavirus 2 by RT PCR: NEGATIVE

## 2021-01-16 LAB — BRAIN NATRIURETIC PEPTIDE: B Natriuretic Peptide: 117.1 pg/mL — ABNORMAL HIGH (ref 0.0–100.0)

## 2021-01-16 LAB — AMMONIA: Ammonia: 27 umol/L (ref 9–35)

## 2021-01-16 LAB — APTT: aPTT: 36 seconds (ref 24–36)

## 2021-01-16 LAB — LACTIC ACID, PLASMA: Lactic Acid, Venous: 1.3 mmol/L (ref 0.5–1.9)

## 2021-01-16 MED ORDER — METOPROLOL TARTRATE 25 MG PO TABS
25.0000 mg | ORAL_TABLET | Freq: Two times a day (BID) | ORAL | Status: DC
  Administered 2021-01-17 – 2021-01-22 (×11): 25 mg via ORAL
  Filled 2021-01-16 (×12): qty 1

## 2021-01-16 MED ORDER — QUETIAPINE FUMARATE 25 MG PO TABS
25.0000 mg | ORAL_TABLET | Freq: Two times a day (BID) | ORAL | Status: DC
  Administered 2021-01-17: 12:00:00 25 mg via ORAL
  Filled 2021-01-16: qty 1

## 2021-01-16 MED ORDER — SODIUM CHLORIDE 0.9 % IV SOLN
1.0000 g | Freq: Once | INTRAVENOUS | Status: AC
  Administered 2021-01-16: 1 g via INTRAVENOUS
  Filled 2021-01-16: qty 10

## 2021-01-16 MED ORDER — APIXABAN 5 MG PO TABS: 5.0000 mg | ORAL_TABLET | Freq: Two times a day (BID) | ORAL | Status: DC

## 2021-01-16 MED ORDER — RIVASTIGMINE TARTRATE 1.5 MG PO CAPS
1.5000 mg | ORAL_CAPSULE | Freq: Two times a day (BID) | ORAL | Status: DC
  Administered 2021-01-17 – 2021-01-22 (×11): 1.5 mg via ORAL
  Filled 2021-01-16 (×14): qty 1

## 2021-01-16 MED ORDER — ENOXAPARIN SODIUM 100 MG/ML ~~LOC~~ SOLN
100.0000 mg | Freq: Two times a day (BID) | SUBCUTANEOUS | Status: DC
  Administered 2021-01-16 – 2021-01-17 (×2): 100 mg via SUBCUTANEOUS
  Filled 2021-01-16 (×3): qty 1

## 2021-01-16 MED ORDER — ATORVASTATIN CALCIUM 20 MG PO TABS
80.0000 mg | ORAL_TABLET | Freq: Every day | ORAL | Status: DC
  Administered 2021-01-17 – 2021-01-21 (×5): 80 mg via ORAL
  Filled 2021-01-16 (×5): qty 4

## 2021-01-16 MED ORDER — LORAZEPAM 2 MG/ML IJ SOLN: 2.0000 mg | Freq: Four times a day (QID) | INTRAMUSCULAR | Status: DC | PRN

## 2021-01-16 MED ORDER — LACTATED RINGERS IV BOLUS
500.0000 mL | Freq: Once | INTRAVENOUS | Status: AC
  Administered 2021-01-16: 500 mL via INTRAVENOUS

## 2021-01-16 MED ORDER — RIVAROXABAN 20 MG PO TABS
20.0000 mg | ORAL_TABLET | Freq: Every day | ORAL | Status: DC
  Filled 2021-01-16: qty 1

## 2021-01-16 MED ORDER — ATORVASTATIN CALCIUM 20 MG PO TABS: 40.0000 mg | ORAL_TABLET | Freq: Every day | ORAL | Status: DC

## 2021-01-16 MED ORDER — HYDRALAZINE HCL 20 MG/ML IJ SOLN: 5.0000 mg | Freq: Four times a day (QID) | INTRAMUSCULAR | Status: DC | PRN

## 2021-01-16 MED ORDER — SODIUM CHLORIDE 0.9% FLUSH
3.0000 mL | Freq: Once | INTRAVENOUS | Status: AC
  Administered 2021-01-16: 3 mL via INTRAVENOUS

## 2021-01-16 MED ORDER — LEVETIRACETAM IN NACL 1000 MG/100ML IV SOLN
1000.0000 mg | Freq: Two times a day (BID) | INTRAVENOUS | Status: DC
  Administered 2021-01-16 – 2021-01-18 (×5): 1000 mg via INTRAVENOUS
  Filled 2021-01-16 (×6): qty 100

## 2021-01-16 MED ORDER — METOPROLOL TARTRATE 25 MG PO TABS: 25.0000 mg | ORAL_TABLET | Freq: Two times a day (BID) | ORAL | Status: DC

## 2021-01-16 MED ORDER — INSULIN ASPART 100 UNIT/ML ~~LOC~~ SOLN: 0.0000 [IU] | SUBCUTANEOUS | Status: DC

## 2021-01-16 NOTE — Progress Notes (Signed)
Received a secure chat message from patient's bedside RN regarding the patient not being able to pass his swallow evaluation at bedside.  Consulted pharmacy to start full dose Lovenox for atrial fibrillation as the patient's home Xarelto is being held.    He is a diabetic and will have CBG checked Q4H.  Hyperglycemia will be corrected with sensitive insulin sliding scale if indicated.

## 2021-01-16 NOTE — Progress Notes (Signed)
   01/16/21 1020  Clinical Encounter Type  Visited With Patient and family together  Visit Type Initial;Spiritual support;Social support;Code  Referral From Nurse  Consult/Referral To Chaplain   Code stoke page received for the patient. Upon arrival, I noted that the patient had been taken for testing/evaluation. Chaplain waited for PT's wife to escort her to PT's room, but the nurse had already done so. Chaplain ministered with presence and prayer as requested. Chaplain will follow up at a later time.

## 2021-01-16 NOTE — ED Notes (Signed)
Pt is alert, able to speak. Orientation is questionable- pt is hard of hearing.

## 2021-01-16 NOTE — ED Notes (Signed)
Wife states pt has history of sundowning and requests a sitter in inpatient room.

## 2021-01-16 NOTE — ED Notes (Signed)
Blood glucose 114. 

## 2021-01-16 NOTE — Consult Note (Signed)
ANTICOAGULATION CONSULT NOTE - Follow Up Consult  Pharmacy Consult for Lovenox Indication: atrial fibrillation  Allergies  Allergen Reactions  . Doxycycline Swelling  . Lovastatin Swelling    Patient Measurements: Height: 6' (182.9 cm) Weight: 98.1 kg (216 lb 4.3 oz) IBW/kg (Calculated) : 77.6  Vital Signs: Temp: 98.3 F (36.8 C) (04/16 2019) Temp Source: Oral (04/16 1616) BP: 185/151 (04/16 2019) Pulse Rate: 74 (04/16 2019)  Labs: Recent Labs    01/16/21 1105  HGB 12.7*  HCT 40.1  PLT 147*  APTT 36  LABPROT 25.7*  INR 2.4*  CREATININE 0.95    Estimated Creatinine Clearance: 74 mL/min (by C-G formula based on SCr of 0.95 mg/dL).   Medications:  PTA Xarelto 20 mg daily - last dose 4/15 @ 0800  Assessment:  80 y.o. male with medical history significant for dementia, A. fib on Xarelto, Parkinson's disease, CHF, and seizure disorder who presented with sudden onset altered mental status. Patient was unable to pass his bedside swallow eval so pharmacy has been consulted for Lovenox dosing while his Xarelto is being held. Hgb: 12.7; Plts: 147  Goal of Therapy:  Anti-Xa level 0.6-1 units/ml 4hrs after LMWH dose given Monitor platelets by anticoagulation protocol: Yes   Plan:  Lovenox 100 mg (1 mg/kg) q12h  Monitor daily CBC, s/s of bleed  Derrek Gu, PharmD 01/16/2021,8:45 PM

## 2021-01-16 NOTE — H&P (Addendum)
History and Physical  Ian King ESP:[id number] DOB: January 24, 1941 DOA: 01/16/2021  Referring physician: Dr. Katrinka Blazing, EDP PCP: Marguarite Arbour, MD  Outpatient Specialists: Neurology, cardiology. Patient coming from: Home.  Chief Complaint: Altered mental status.  HPI: Ian King is a 80 y.o. male with medical history significant for dementia, permanent A. fib on Xarelto, Parkinson's disease, chronic diastolic CHF, seizure disorder on AEDs, who presented to [facility] ED via EMS from home due to sudden onset altered mental status with onset at 9 AM this morning.  Per his wife who is his caregiver he was in his usual state of health until this morning around 9:00 AM when he was noted to be more confused and moaning while she was giving him a shower.  She called EMS.  He continued to be altered.  While in the ER a code stroke was called.  His altered mental status gradually improved.  He had a CT head that was done and was nonacute.  He was seen by neurology with recommendation for MRI brain and EEG.  At the time of this visit, per his wife his mentation is getting close to baseline.  TRH, hospitalist team, was asked to admit.  ED Course:  Afebrile.  BP 182/103, heart rate 72, respiratory 25, O2 saturation 100% on room air.  MRI brain revealed punctate acute cortical infarct within the anterior left frontal lobe.  Discussed with neurology, okay to restart Xarelto.  Review of Systems: Review of systems as noted in the HPI. All other systems reviewed and are negative.   Past Medical History:  Diagnosis Date  . A-fib (HCC)   . Diabetes mellitus without complication (HCC)   . Hypertension    Past Surgical History:  Procedure Laterality Date  . arm surgery    . BACK SURGERY    . JOINT REPLACEMENT     hip  . JOINT REPLACEMENT Right    knee  . SPINE SURGERY      Social History:  reports that he has quit smoking. His smoking use included cigars. He has never used smokeless tobacco. He reports  that he does not drink alcohol and does not use drugs.   Allergies  Allergen Reactions  . Doxycycline Swelling  . Lovastatin Swelling    Family History  Problem Relation Age of Onset  . Heart disease Mother   . Heart disease Father   . Heart disease Sister   . Leukemia Brother   . Heart disease Brother   . Diabetes Brother       Prior to Admission medications   Medication Sig Start Date End Date Taking? Authorizing Provider  atorvastatin (LIPITOR) 40 MG tablet Take 40 mg by mouth at bedtime.   Yes [provider]  cholecalciferol (VITAMIN D3) 25 MCG (1000 UNIT) tablet Take 1,000 Units by mouth daily.   Yes [provider]  cyanocobalamin 1000 MCG tablet Take 1,000 mcg by mouth daily. 03/11/20 03/11/21 Yes [provider]  furosemide (LASIX) 20 MG tablet Take 20 mg by mouth 4 (four) times a week.    Yes [provider]  lamoTRIgine (LAMICTAL) 25 MG tablet Take 50 mg by mouth 2 (two) times daily.   Yes [provider]  levETIRAcetam (KEPPRA) 1000 MG tablet 1,000 mg 2 (two) times daily. 03/10/20  Yes [provider]  metFORMIN (GLUCOPHAGE-XR) 500 MG 24 hr tablet Take 1,000 mg by mouth in the morning and at bedtime.    Yes [provider]  metoprolol  tartrate (LOPRESSOR) 25 MG tablet Take 25 mg by mouth 2 (two) times daily.   Yes [provider]  Omega-3 Fatty Acids (FISH OIL) 1000 MG CAPS Take 1,000 mg by mouth in the morning and at bedtime.   Yes [provider]  polyethylene glycol powder (GLYCOLAX/MIRALAX) 17 GM/SCOOP powder Take by mouth. 04/12/19  Yes [provider]  QUEtiapine (SEROQUEL) 25 MG tablet Take 25 mg by mouth 2 (two) times daily.   Yes [provider]  ramipril (ALTACE) 10 MG capsule Take 10 mg by mouth 2 (two) times daily.   Yes [provider]  rivaroxaban (XARELTO) 20 MG TABS tablet Take 20 mg by mouth daily with breakfast. 01/21/20  Yes [provider]   rivastigmine (EXELON) 1.5 MG capsule Take 1.5 mg by mouth 2 (two) times daily.   Yes [provider]  senna-docusate (SENOKOT-S) 8.6-50 MG tablet Take 1 tablet by mouth daily.   Yes [provider]  ketoconazole (NIZORAL) 2 % shampoo Apply 1 application topically 2 (two) times a week.    [provider]    Physical Exam: BP (!) 182/103   Pulse 72   Temp 98.9 F (37.2 C) (Oral)   Resp 15   Ht 6' (1.829 m)   Wt 100.1 kg   SpO2 100%   BMI 29.92 kg/m   . General: 80 y.o. year-old male well developed well nourished in no acute distress.  Alert and pleasantly demented. . Cardiovascular: Regular rate and rhythm with no rubs or gallops.  No thyromegaly or JVD noted.  No lower extremity edema. 2/4 pulses in all 4 extremities. Marland Kitchen. Respiratory: Clear to auscultation with no wheezes or rales. Good inspiratory effort. . Abdomen: Soft nontender nondistended with normal bowel sounds x4 quadrants. . Muskuloskeletal: No cyanosis, clubbing or edema noted bilaterally . Neuro: CN II-XII intact, strength, sensation, reflexes . Skin: No ulcerative lesions noted or rashes . Psychiatry: Judgement and insight appear altered in the setting of dementia. Mood is appropriate for condition and setting          Labs on Admission:  Basic Metabolic Panel: Recent Labs  Lab 01/16/21 1105  NA 140  K 3.9  CL 103  CO2 29  GLUCOSE 121*  BUN 12  CREATININE 0.95  CALCIUM 9.9   Liver Function Tests: Recent Labs  Lab 01/16/21 1105  AST 12*  ALT 9  ALKPHOS 76  BILITOT 1.1  PROT 7.8  ALBUMIN 4.1   No results for input(s): LIPASE, AMYLASE in the last 168 hours. Recent Labs  Lab 01/16/21 1106  AMMONIA 27   CBC: Recent Labs  Lab 01/16/21 1105  WBC 6.4  NEUTROABS 4.7  HGB 12.7*  HCT 40.1  MCV 99.8  PLT 147*   Cardiac Enzymes: No results for input(s): CKTOTAL, CKMB, CKMBINDEX, TROPONINI in the last 168 hours.  BNP (last 3 results) No results for input(s): BNP in the  last 8760 hours.  ProBNP (last 3 results) No results for input(s): PROBNP in the last 8760 hours.  CBG: Recent Labs  Lab 01/16/21 1025  GLUCAP 114*    Radiological Exams on Admission: MR BRAIN WO CONTRAST  Result Date: 01/16/2021 CLINICAL DATA:  Neuro deficit, acute, stroke suspected. EXAM: MRI HEAD WITHOUT CONTRAST TECHNIQUE: Multiplanar, multiecho pulse sequences of the brain and surrounding structures were obtained without intravenous contrast. COMPARISON:  Prior head CT examinations 01/16/2021 and earlier. FINDINGS: Brain: Examination is intermittently motion degraded. Most notably, there is moderate motion degradation of the  coronal diffusion-weighted sequence, moderate motion degradation of the axial T2 weighted sequence, moderate motion degradation of the axial T1 weighted sequence. Moderate to moderately advanced cerebral atrophy. Comparatively mild cerebellar atrophy. Commensurate prominence of the ventricles and sulci. There is a punctate acute cortical infarct within the anterior left frontal lobe (series 5, image 39). Small chronic cortically based infarcts within the bilateral frontal, parietal and occipital lobes. Chronic infarct within the right corona radiata/basal ganglia. Chronic lacunar infarct within the left thalamus. Background moderate multifocal T2/FLAIR hyperintensity within the cerebral white matter is nonspecific, but compatible with chronic small vessel ischemic disease. Small chronic infarcts within the bilateral cerebellar hemispheres No evidence of intracranial mass. No chronic intracranial blood products. No extra-axial fluid collection. No midline shift. Vascular: Expected proximal arterial flow voids. Skull and upper cervical spine: No focal marrow lesion. Ligamentous hypertrophy/pannus formation posterior to the dens. Sinuses/Orbits: Visualized orbits show no acute finding. Mild right ethmoid sinus mucosal thickening. IMPRESSION: Motion degraded examination, as  described. Punctate acute cortical infarct within the anterior left frontal lobe. Small chronic cortically based infarcts within the bilateral frontal, parietal and occipital lobes. Additional chronic infarcts within the right corona radiata/basal ganglia, left thalamus and left cerebellar hemisphere. Background moderate cerebral white matter chronic small vessel ischemic disease. Moderate to moderately advanced cerebral atrophy with comparatively mild cerebellar atrophy. Electronically Signed   By: Jackey Loge DO   On: 01/16/2021 14:21   DG Chest Portable 1 View  Result Date: 01/16/2021 CLINICAL DATA:  Altered mental status. EXAM: PORTABLE CHEST 1 VIEW COMPARISON:  None. No prior films are available on PACs for comparison. FINDINGS: Status post prior median sternotomy. The heart size is mildly enlarged. The mediastinal contour is otherwise unremarkable. Mild increased central pulmonary vessel caliber is identified. There is no focal pneumonia or pleural effusion. Spinal rods are noted. Degenerative joint changes of bilateral shoulders are identified. IMPRESSION: Mild increased central pulmonary vessel caliber. This can be seen in mild vascular congestion. Electronically Signed   By: Sherian Rein M.D.   On: 01/16/2021 11:44   CT HEAD CODE STROKE WO CONTRAST  Addendum Date: 01/16/2021   ADDENDUM REPORT: 01/16/2021 10:51 ADDENDUM: Study discussed by telephone with Dr. Antoine Primas on 01/16/2021 at 1047 hours. Electronically Signed   By: Odessa Fleming M.D.   On: 01/16/2021 10:51   Result Date: 01/16/2021 CLINICAL DATA:  Code stroke. 80 year old male last known normal 0900 hours. Sudden onset altered mental status. EXAM: CT HEAD WITHOUT CONTRAST TECHNIQUE: Contiguous axial images were obtained from the base of the skull through the vertex without intravenous contrast. COMPARISON:  Head CT 11/21/2020 FINDINGS: Brain: Stable cerebral volume. No midline shift, mass effect, or evidence of intracranial mass lesion.  Stable ventricle size and configuration. No acute intracranial hemorrhage identified. Chronic encephalomalacia in the right basal ganglia and along the left parieto-occipital sulcus compatible with prior ischemia. Likewise small areas of stable chronic cortical at the superior right parietal and superior right occipital lobes, also the anterior right frontal lobe series 3, image 23. Stable possible small chronic lacunar infarct of the left thalamus. Elsewhere stable patchy white matter hypodensity. No cortically based acute infarct identified. Vascular: Extensive Calcified atherosclerosis at the skull base. No suspicious intracranial vascular hyperdensity. Skull: No acute osseous abnormality identified. Sinuses/Orbits: Visualized paranasal sinuses and mastoids are stable and well aerated. Other: No acute orbit or scalp soft tissue finding. ASPECTS [facility] Stroke Program Early CT Score) Total score (0-10 with 10 being normal): 10 (chronic encephalomalacia). IMPRESSION: 1. Stable  CT appearance of bilateral chronic ischemic disease. 2. No acute cortically based infarct or acute intracranial hemorrhage identified. ASPECTS 10. Electronically Signed: By: Odessa Fleming M.D. On: 01/16/2021 10:42    EKG: I independently viewed the EKG done and my findings are as followed: A. fib.  Rate of 65.  Nonspecific ST-T changes.  QTc 390.  Assessment/Plan Present on Admission: . AMS (altered mental status)  Active Problems:   AMS (altered mental status)  Acute metabolic encephalopathy in the setting of acute punctate cortical infarct within the anterior left frontal lobe. Last known well 9 AM this morning. 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 Code stroke was called on arrival to the ED today. Seen by neurology, recommended MRI brain and EEG. MRI brain revealed the findings as stated above. Okay to restart Xarelto per neurology, likely not a failure of Xarelto, the  patient skipped his dose this morning. Further recommendations per neurology. Frequent neuro-checks NIH score scale. PT/OT/speech evaluation  Acute punctate cortical infarct within the anterior left frontal lobe. Management per neurology. Continue home Xarelto Increase home Lipitor to 80 mg daily. Permissive hypertension up to 220/110. Obtain medical records of prior stroke work-up from [facility] Obtain fasting lipid panel and A1c in the morning  Permanent A. fib on Xarelto Currently rate controlled on Lopressor 25 mg twice daily. On Xarelto for secondary CVA prevention  Seizure disorder EEG ordered per Neurology Continue Keppra as recommended by neurology Patient is also on Lamictal at home No reported recent seizure activity Management per neurology  Dementia/Parkinson disease Resume home regimen  History of sundowning Reported by his wife, she requests a Electrical engineer in place Fall precautions Delirium precautions.  Dysphagia Per bedside RN patient failed his swallow evaluation at bedside Keep n.p.o. until passes swallow evaluation by speech therapist Aspiration precautions  Hyperlipidemia Obtain fasting lipid panel Resume home Lipitor, increased dose to 80 mg daily  Chronic diastolic CHF He is on home p.o. Lasix, hold off for now Last hospitalization was at [facility] when he was diagnosed with acute CVA in June 2021 Medical records requested    DVT prophylaxis: Xarelto  Code Status: DNR  Family Communication: Wife at bedside.  Disposition Plan: Admit to MedSurg unit with remote telemetry.  Consults called: Neurology consulted by EDP.  Admission status: Observation status.   Status is: Observation    Dispo:  Patient From: Home  Planned Disposition: Home, possibly on 01/17/2021 or when neurology signed off.  Medically stable for discharge: No      Darlin Drop MD [facility] Pager [phone]  If 7PM-7AM, please contact  night-coverage [email or web address] Password [facility]  01/16/2021, 2:41 PM

## 2021-01-16 NOTE — ED Notes (Signed)
Informed Rn bed assigned [id number]

## 2021-01-16 NOTE — Progress Notes (Signed)
I was able to speak with patient's daughter. Patient is in the process of being transitioned from keppra 1g bid to Lamotrigine for behavioral issues including aggression. They noted a facial rash during this transition. Lamictal will not be restarted inpatient with plan to transition to a different anti seizure medicine outpatient. For now, we will continue Keppra.   Daughter also brought up that patient has had strokes while bein g compliant with his Xarelto. He has been on Xarelto since 3-4 years. MRI from June 2021 at [facility] does demonstrate acute strokes. Discussed that given patient's Lewy Body dementia and Parkinsonism coupled with cerebral atrophy, he is at risk for falls. warfarin may not be ideal given higher risk for ICH, SDH and from a INR monitoring standpoint. Will switch patient to a different DOAC for now to see if that is more helpful. There is no d ata to suggest that one DOAC is more efficacious than other but will do Eliquis when he is able to tolerate PO.   Daughter also brought up that seroquel has not been helpful with agitation. Discussed that Pimavanserin(Nuplazid) outpatient might be a better idea given it is unlikely to worsen Parkinsonism.  Erick Blinks, MD.

## 2021-01-16 NOTE — ED Triage Notes (Signed)
Patient from home, witness change of mental status at 0900 as per wife, Patient usually verbal and follows commands. arrives via ems @ 1020 Alert and awake, making groaning type sounds. code stroke, neurology at bedside upon arrival patient, straight to CT. As per ems. Patient able to follow command upon arrival. NIH score 3.

## 2021-01-16 NOTE — ED Notes (Signed)
MRI states they are coming to bring pt to MRI shortly.

## 2021-01-16 NOTE — ED Provider Notes (Signed)
[facility] Emergency Department Provider Note  ____________________________________________   Event Date/Time   First MD Initiated Contact with Patient 01/16/21 1024     (approximate)  I have reviewed the triage vital signs and the nursing notes.   HISTORY  Chief Complaint Code Stroke   HPI Ian King is a 80 y.o. male with a past medical history of Alzheimer's dementia although reportedly oriented x4 at baseline, HTN, DM, A. fib on Xarelto who presents via EMS from home for assessment of sudden onset of change in mental status around 9 AM this morning noted by wife.  Very limited history from patient EMS during transport as patient did not respond any questions and was moaning during transport.  No history of recent falls per family or EMS.  On arrival patient is moaning and does not answer any direct questions.         Past Medical History:  Diagnosis Date  . A-fib (HCC)   . Diabetes mellitus without complication (HCC)   . Hypertension     Patient Active Problem List   Diagnosis Date Noted  . AMS (altered mental status) 01/16/2021  . Raynaud phenomenon 03/22/2020  . Chronic venous insufficiency 03/22/2020  . B12 deficiency 03/10/2020  . 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 Pseudophakia of left eye 07/02/2018  . Obesity, Class I, BMI 30-34.9 02/15/2018  . Closed displaced fracture of distal phalanx of left middle finger 03/08/2017  . Closed fracture of distal end of left radius with routine healing 01/16/2017  . Closed fracture of nasal bone with routine healing 01/12/2017  . 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  . Closed fracture of dorsal (thoracic) vertebra (HCC) 12/19/2016  . Fall 12/19/2016  . Fracture of left radius 12/19/2016  . Primary osteoarthritis of right knee 10/14/2015  . Acute pain of right knee 09/09/2015  . Aftercare following joint replacement surgery 07/31/2015  . Closed  nondisplaced fracture of lateral condyle of femur (HCC) 07/31/2015  . Bilateral edema of lower extremity 06/26/2014  . Chronic atrial fibrillation (HCC) 07/13/2013  . Wound disruption, post-op, skin 10/11/2012  . Cellulitis and abscess 10/05/2012  . Hyperlipidemia 08/17/2012  . Osteoarthritis 08/17/2012  . Seizure disorder (HCC) 08/16/2012  . Hip pain 12/12/2011  . 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 CAD (coronary artery disease) 07/07/2011  . Diabetes mellitus, type 2 (HCC) 07/07/2011  . HTN (hypertension) 07/07/2011    Past Surgical History:  Procedure Laterality Date  . arm surgery    . BACK SURGERY    . JOINT REPLACEMENT     hip  . JOINT REPLACEMENT Right    knee  . SPINE SURGERY      Prior to Admission medications   Medication Sig Start Date End Date Taking? Authorizing Provider  amLODipine (NORVASC) 10 MG tablet Take 10 mg by mouth daily. Patient not taking: Reported on 03/19/2020    [provider]  atorvastatin (LIPITOR) 40 MG tablet Take 40 mg by mouth daily.    [provider]  bacitracin ointment Apply 1 application topically 2 (two) times daily. Patient not taking: Reported on 03/19/2020    [provider]  cyanocobalamin 1000 MCG tablet Take by mouth. 03/11/20 03/11/21  [provider]  desonide (DESOWEN) 0.05 % cream Apply topically 2 (two) times daily. Patient not taking: Reported on 03/19/2020    [provider]  furosemide (LASIX) 20 MG tablet Take 20 mg by mouth 4 (four) times a week.  [provider]  ketoconazole (NIZORAL) 2 % shampoo Apply 1 application topically 2 (two) times a week. Patient not taking: Reported on 03/19/2020    [provider]  levETIRAcetam (KEPPRA) 1000 MG tablet  03/10/20   [provider]  metFORMIN (GLUCOPHAGE-XR) 500 MG 24 hr tablet Take 1,000 mg by mouth in the morning and at bedtime.     [provider]  metoprolol tartrate (LOPRESSOR) 25 MG tablet Take 25  mg by mouth 2 (two) times daily.    [provider]  Omega-3 Fatty Acids (FISH OIL) 1000 MG CAPS Take by mouth.    [provider]  polyethylene glycol powder (GLYCOLAX/MIRALAX) 17 GM/SCOOP powder Take by mouth. 04/12/19   [provider]  ramipril (ALTACE) 10 MG capsule Take 10 mg by mouth daily.    [provider]  rivaroxaban (XARELTO) 20 MG TABS tablet Take by mouth. 01/21/20   [provider]  senna-docusate (SENOKOT-S) 8.6-50 MG tablet Take 1 tablet by mouth daily. Patient not taking: Reported on 03/19/2020    [provider]    Allergies Lovastatin  Family History  Problem Relation Age of Onset  . Heart disease Mother   . Heart disease Father   . Heart disease Sister   . Leukemia Brother   . Heart disease Brother   . Diabetes Brother     Social History Social History   Tobacco Use  . Smoking status: Former Smoker    Types: Cigars  . Smokeless tobacco: Never Used  Substance Use Topics  . Alcohol use: Never  . Drug use: Never    Review of Systems  ROS    ____________________________________________   PHYSICAL EXAM:  VITAL SIGNS: ED Triage Vitals  Enc Vitals Group     BP      Pulse      Resp      Temp      Temp src      SpO2      Weight      Height      Head Circumference      Peak Flow      Pain Score      Pain Loc      Pain Edu?      Excl. in GC?    Vitals:   01/16/21 1200 01/16/21 1215  BP: (!) 157/82 (!) 169/82  Pulse: 60 69  Resp: (!) 23 (!) 22  Temp:    SpO2: 100% 100%   Physical Exam Vitals and nursing note reviewed.  Constitutional:      Appearance: He is well-developed.  HENT:     Head: Normocephalic and atraumatic.  Eyes:     Conjunctiva/sclera: Conjunctivae normal.  Cardiovascular:     Rate and Rhythm: Normal rate and regular rhythm.     Heart sounds: No murmur heard.   Pulmonary:     Effort: Pulmonary effort is normal. No respiratory distress.     Breath sounds:  Normal breath sounds.  Abdominal:     Palpations: Abdomen is soft.     Tenderness: There is no abdominal tenderness.  Musculoskeletal:     Cervical back: Neck supple.  Skin:    General: Skin is warm and dry.  Neurological:     Mental Status: He is alert.     Patient does have symmetric strength in his upper and lower extremities and does follow some commands. ____________________________________________   LABS (all labs ordered are listed, but only abnormal results are displayed)  Labs Reviewed  PROTIME-INR - Abnormal; Notable for the following components:      Result Value   Prothrombin Time 25.7 (*)    INR 2.4 (*)    All other components within normal limits  CBC - Abnormal; Notable for the following components:   RBC 4.02 (*)    Hemoglobin 12.7 (*)    Platelets 147 (*)    All other components within normal limits  COMPREHENSIVE METABOLIC PANEL - Abnormal; Notable for the following components:   Glucose, Bld 121 (*)    AST 12 (*)    All other components within normal limits  URINALYSIS, COMPLETE (UACMP) WITH MICROSCOPIC - Abnormal; Notable for the following components:   Color, Urine YELLOW (*)    APPearance CLEAR (*)    Hgb urine dipstick SMALL (*)    Leukocytes,Ua TRACE (*)    All other components within normal limits  CBG MONITORING, ED - Abnormal; Notable for the following components:   Glucose-Capillary 114 (*)    All other components within normal limits  RESP PANEL BY RT-PCR (FLU A&B, COVID) ARPGX2  URINE CULTURE  APTT  DIFFERENTIAL  TSH  AMMONIA  LACTIC ACID, PLASMA  PROCALCITONIN  I-STAT CREATININE, ED   ____________________________________________  EKG  CTA A. fib with a ventricular rate of 65, normal axis, unremarkable intervals and no clearance of acute ischemia. ____________________________________________  RADIOLOGY  ED MD interpretation: CT head shows notes of intracranial hemorrhage but does show stable bilateral ischemic disease and  prior infarct.  Chest x-ray shows no clear for consolidation, overt edema, pneumothorax but some mild central pulmonary vessel change increased from prior concern of possible pulmonary hypertension.  Official radiology report(s): DG Chest Portable 1 View  Result Date: 01/16/2021 CLINICAL DATA:  Altered mental status. EXAM: PORTABLE CHEST 1 VIEW COMPARISON:  None. No prior films are available on PACs for comparison. FINDINGS: Status post prior median sternotomy. The heart size is mildly enlarged. The mediastinal contour is otherwise unremarkable. Mild increased central pulmonary vessel caliber is identified. There is no focal pneumonia or pleural effusion. Spinal rods are noted. Degenerative joint changes of bilateral shoulders are identified. IMPRESSION: Mild increased central pulmonary vessel caliber. This can be seen in mild vascular congestion. Electronically Signed   By: Sherian Rein M.D.   On: 01/16/2021 11:44   CT HEAD CODE STROKE WO CONTRAST  Addendum Date: 01/16/2021   ADDENDUM REPORT: 01/16/2021 10:51 ADDENDUM: Study discussed by telephone with Dr. Antoine Primas on 01/16/2021 at 1047 hours. Electronically Signed   By: Odessa Fleming M.D.   On: 01/16/2021 10:51   Result Date: 01/16/2021 CLINICAL DATA:  Code stroke. 80 year old male last known normal 0900 hours. Sudden onset altered mental status. EXAM: CT HEAD WITHOUT CONTRAST TECHNIQUE: Contiguous axial images were obtained from the base of the skull through the vertex without intravenous contrast. COMPARISON:  Head CT 11/21/2020 FINDINGS: Brain: Stable cerebral volume. No midline shift, mass effect, or evidence of intracranial mass lesion. Stable ventricle size and configuration. No acute intracranial hemorrhage identified. Chronic encephalomalacia in the right basal ganglia and along the left parieto-occipital sulcus compatible with prior ischemia. Likewise small areas of stable chronic cortical at the superior right parietal and superior right  occipital lobes, also the anterior right frontal lobe series 3, image 23. Stable possible small chronic lacunar infarct of the left thalamus. Elsewhere stable patchy white matter hypodensity. No cortically based acute infarct identified. Vascular: Extensive Calcified atherosclerosis at the skull base. No suspicious intracranial vascular hyperdensity. Skull: No  acute osseous abnormality identified. Sinuses/Orbits: Visualized paranasal sinuses and mastoids are stable and well aerated. Other: No acute orbit or scalp soft tissue finding. ASPECTS [facility]([place] Stroke Program Early CT Score) Total score (0-10 with 10 being normal): 10 (chronic encephalomalacia). IMPRESSION: 1. Stable CT appearance of bilateral chronic ischemic disease. 2. No acute cortically based infarct or acute intracranial hemorrhage identified. ASPECTS 10. Electronically Signed: By: Odessa FlemingH  Hall M.D. On: 01/16/2021 10:42    ____________________________________________   PROCEDURES  Procedure(s) performed (including Critical Care):  .1-3 Lead EKG Interpretation Performed by: Gilles ChiquitoSmith, Melodee Lupe P, MD Authorized by: Gilles ChiquitoSmith, Chan Sheahan P, MD     Interpretation: normal     ECG rate assessment: normal     Rhythm: atrial fibrillation     Ectopy: none       ____________________________________________   INITIAL IMPRESSION / ASSESSMENT AND PLAN / ED COURSE      Patient presents with above to history exam for assessment of acute onset of altered mental status.  On arrival he is afebrile and hemodynamically stable.  He is unable to find any significant history but does follow some basic commands in his extremities does not have any obvious focal deficits.  He was encoded as a code stroke and stroke protocol initiated on patient arrival.  In addition to possible CVA differential includes metabolic derangements, toxic, metabolic, and infectious.  CT Noncon head shows no evidence of acute intracranial hemorrhage but does show evidence of prior CVAs.   Chest x-ray showed no evidence infection or overt volume overload.  CBC has no leukocytosis or significant anemia.  CMP shows no significant electrode or metabolic derangements.  TSH and ammonia unremarkable.  Lactic is not elevated given absence of leukocytosis or fever Evalose patient for acute sepsis or bacteremia.  However UA does have some trace LE S and 6-10 WBCs and is somewhat difficult to exclude cystitis.  Will send urine culture and give a dose of Rocephin.  Patient seen by neurology who recommends MRI and EEG for possible seizure.  Patient was given some Keppra ordered by neurology.  Plan to admit to medicine service for further evaluation and management.      ____________________________________________   FINAL CLINICAL IMPRESSION(S) / ED DIAGNOSES  Final diagnoses:  Altered mental status, unspecified altered mental status type    Medications  levETIRAcetam (KEPPRA) IVPB 1000 mg/100 mL premix (0 mg Intravenous Stopped 01/16/21 1211)  cefTRIAXone (ROCEPHIN) 1 g in sodium chloride 0.9 % 100 mL IVPB (1 g Intravenous New Bag/Given 01/16/21 1223)  sodium chloride flush (NS) 0.9 % injection 3 mL (3 mLs Intravenous Given 01/16/21 1114)  lactated ringers bolus 500 mL (0 mLs Intravenous Stopped 01/16/21 1211)     ED Discharge Orders    None       Note:  This document was prepared using Dragon voice recognition software and may include unintentional dictation errors.   Gilles ChiquitoSmith, Jazzmine Kleiman P, MD 01/16/21 [phone]

## 2021-01-16 NOTE — Consult Note (Signed)
NEUROLOGY CONSULTATION NOTE   Date of service: January 16, 2021 Patient Name: Ian King MRN:  [id number] DOB:  1941/03/05 Reason for consult: "Stroke code" _ _ _   _ __   _ __ _ _  __ __   _ __   __ _  History of Present Illness  Ian King is a 80 y.o. male with PMH significant for pAfibb on Xareltom hx of DM2, HTN, baseline dementia, seizures on Keppra who presents with acute change in mental status. Was seen by wife to go to tale a shower at 0900. EMS called for sudden change in mentation. Usually he is awake, alert and communicative.  On arrival, patient is moaning, moving all extremities. Per EMS, initially was not following commands, enroute started answering yes and no questions.  AMS progressively improves as he is moved to the CT Scanner. He tells me his and his wife's name, able to follow some commands.  Glucose 114, SBP 160s, Sats 97% on RA.  CTH non contrast with no acute abnormality. Has chronic infarcts but no acute abnormality, ASPECTS of 10.  LKW 0900 on 01/16/21 MRS: unable to determine but concern that this is 3 or higher TPA/Thrombectomy: No, he is on Xarelto, low suspicion for stroke.   ROS   Denies pain but unable to obtain a detailed ROS due to AMS.  Past History   Past Medical History:  Diagnosis Date  . A-fib (HCC)   . Diabetes mellitus without complication (HCC)   . Hypertension    Past Surgical History:  Procedure Laterality Date  . arm surgery    . BACK SURGERY    . JOINT REPLACEMENT     hip  . JOINT REPLACEMENT Right    knee  . SPINE SURGERY     Family History  Problem Relation Age of Onset  . Heart disease Mother   . Heart disease Father   . Heart disease Sister   . Leukemia Brother   . Heart disease Brother   . Diabetes Brother    Social History   Socioeconomic History  . Marital status: Married    Spouse name: Not on file  . Number of children: Not on file  . Years of education: Not on file  . Highest education level: Not  on file  Occupational History  . Not on file  Tobacco Use  . Smoking status: Former Smoker    Types: Cigars  . Smokeless tobacco: Never Used  Substance and Sexual Activity  . Alcohol use: Never  . Drug use: Never  . Sexual activity: Not on file  Other Topics Concern  . Not on file  Social History Narrative  . Not on file   Social Determinants of Health   Financial Resource Strain: Not on file  Food Insecurity: Not on file  Transportation Needs: Not on file  Physical Activity: Not on file  Stress: Not on file  Social Connections: Not on file   Allergies  Allergen Reactions  . Lovastatin Swelling    Medications  (Not in a hospital admission)    Vitals   Vitals:   01/16/21 1050 01/16/21 1051  BP: (!) 162/93   Pulse: 61   Resp: (!) 22   Temp: 98.9 F (37.2 C)   TempSrc: Oral   SpO2: 100%   Weight:  100.1 kg  Height:  6' (1.829 m)     Body mass index is 29.92 kg/m.  Physical Exam   General: Laying in bed;  groans. HENT: Normal oropharynx and mucosa. Normal external appearance of ears and nose. Neck: Supple, no pain or tenderness CV: No JVD. No peripheral edema. Pulmonary: Symmetric Chest rise. Normal respiratory effort. Abdomen: Soft to touch, non-tender. Ext: No cyanosis, edema, or deformity Skin: No rash. Normal palpation of skin.  Musculoskeletal: Normal digits and nails by inspection. No clubbing.  Neurologic Examination  Mental status/Cognition: Alert, oriented to self only, good attention.  Speech/language: Occasionally asnwers with 1 word or phrase, non fluent, comprehension intact to simple commands. Cranial nerves:   CN II Blinks to threat BL   CN III,IV,VI Tracks face on left and on right, no gaze preference or deviation, no nystagmus    CN V    CN VII no asymmetry, no nasolabial fold flattening    CN VIII Turns head towards speech, some concern for hard of hearing   CN IX & X normal palatal elevation, no uvular deviation    CN XI    CN  XII    Motor:  Muscle bulk: poor, tone mildly increased  When hands are held up off the bed, he holds them up with no drift. Hand grip is 5/5 bilaterally.  Lifts BL lower extremities off the bed, when held up off the bed, his legs slowly drift down.  Reflexes:  Right Left Comments  Pectoralis      Biceps (C5/6) 2 2   Brachioradialis (C5/6) 2 2    Triceps (C6/7) 2 2    Patellar (L3/4) 2 2    Achilles (S1)      Hoffman      Plantar     Jaw jerk    Sensation: Grimaces and localizes to pain in all extremities  Coordination/Complex Motor:  - Finger to Nose intact BL - Heel to shin unable to do. - Rapid alternating movement: unable to do - Gait: Deferred.  Labs   CBC: No results for input(s): WBC, NEUTROABS, HGB, HCT, MCV, PLT in the last 168 hours.  Basic Metabolic Panel:  Lab Results  Component Value Date   NA 141 11/21/2020   K 3.7 11/21/2020   CO2 28 11/21/2020   GLUCOSE 126 (H) 11/21/2020   BUN 15 11/21/2020   CREATININE 0.87 11/21/2020   CALCIUM 9.9 11/21/2020   GFRNONAA >60 11/21/2020   Lipid Panel: No results found for: LDLCALC HgbA1c: No results found for: HGBA1C Urine Drug Screen: No results found for: LABOPIA, COCAINSCRNUR, LABBENZ, AMPHETMU, THCU, LABBARB  Alcohol Level No results found for: ETH  CT Head without contrast: CTH was negative for a large hypodensity concerning for a large territory infarct or hyperdensity concerning for an ICH. Notable for chronic infarcts. ASPECTS of 10.  MRI Brain: pending  rEEG: pending  Impression   Ian King is a 80 y.o. male with PMH significant for pAfibb on Xareltom hx of DM2, HTN, baseline dementia, seizures on Keppra who presents with acute change in mental status. Was seen by wife to go to tale a shower at 0900. Exam is non focal and most consistent with encephalopathy. Differential is broad including metabolic, infectious, seizure with post ictal. Rather sudden onset for this to be related to nutritional  deficiency or an autoimmune process.  Recommendations  - MRI Brain without contrast - CBC, CMP, Ammonia, infectious screen with UA, CXR, Flu and COVID swab, lactate and procalcitonin. - recommend routine EEG. - Continue Keppra 1000mg  BID(PO or IV) ______________________________________________________________________   Thank you for the opportunity to take part in the care of  this patient. If you have any further questions, please contact the neurology consultation attending.  Signed,  [place] Pager Number [phone] _ _ _   _ __   _ __ _ _  __ __   _ __   __ _

## 2021-01-16 NOTE — ED Notes (Signed)
As per wife patient with alzheimer and dementia history

## 2021-01-16 NOTE — Plan of Care (Signed)
Pt admitted today from the ED.  Alert to self.  NIH 2.  No signs of pain.  BP 181/77, (no intervention at this time.  Per order to give hydralazine for SBP >220 or DBP>110). Rest of VSS. Pt failed swallow screen in the ED.  Pt remained NPO.

## 2021-01-16 NOTE — Progress Notes (Signed)
Notified Dr. Margo Aye that pt failed swallow screen in the ED. Notified of BP 177/98 HR 64.  MD placed orders.

## 2021-01-16 NOTE — Progress Notes (Signed)
CODE STROKE- PHARMACY COMMUNICATION   Time CODE STROKE called/page received:1008 (ETA in 15 min)   Time response to CODE STROKE was made (via phone): 1023 called. Notified pt h/o Afib prescribed Xarelto with directions for AM dosing with recent fill history,  Time Stroke Kit retrieved from [place] (only if needed): No, tPA non-candidate per Neuro note (d/t DOAC). H/o Sz starting Keppra IV 1g BID  Name of Provider/Nurse contacted: RN Tomasa Hosteller  Past Medical History:  Diagnosis Date  . A-fib ([place])   . Diabetes mellitus without complication ([place])   . Hypertension    Prior to Admission medications   Medication Sig Start Date End Date Taking? Authorizing Provider  amLODipine (NORVASC) 10 MG tablet Take 10 mg by mouth daily. Patient not taking: Reported on 03/19/2020    [provider]  atorvastatin (LIPITOR) 40 MG tablet Take 40 mg by mouth daily.    [provider]  bacitracin ointment Apply 1 application topically 2 (two) times daily. Patient not taking: Reported on 03/19/2020    [provider]  cyanocobalamin 1000 MCG tablet Take by mouth. 03/11/20 03/11/21  [provider]  desonide (DESOWEN) 0.05 % cream Apply topically 2 (two) times daily. Patient not taking: Reported on 03/19/2020    [provider]  furosemide (LASIX) 20 MG tablet Take 20 mg by mouth 4 (four) times a week.     [provider]  ketoconazole (NIZORAL) 2 % shampoo Apply 1 application topically 2 (two) times a week. Patient not taking: Reported on 03/19/2020    [provider]  levETIRAcetam (KEPPRA) 1000 MG tablet  03/10/20   [provider]  metFORMIN (GLUCOPHAGE-XR) 500 MG 24 hr tablet Take 1,000 mg by mouth in the morning and at bedtime.     [provider]  metoprolol tartrate (LOPRESSOR) 25 MG tablet Take 25 mg by mouth 2 (two) times daily.    [provider]  Omega-3 Fatty Acids (FISH OIL) 1000 MG CAPS Take by mouth.    [provider]  polyethylene glycol powder (GLYCOLAX/MIRALAX) 17 GM/SCOOP powder Take by mouth. 04/12/19   [provider]  ramipril (ALTACE) 10 MG capsule Take 10 mg by mouth daily.    [provider]  rivaroxaban (XARELTO) 20 MG TABS tablet Take by mouth. 01/21/20   [provider]  senna-docusate (SENOKOT-S) 8.6-50 MG tablet Take 1 tablet by mouth daily. Patient not taking: Reported on 03/19/2020    [provider]    Lorna Dibble ,PharmD Clinical Pharmacist  01/16/2021  11:21 AM

## 2021-01-17 DIAGNOSIS — G3183 Dementia with Lewy bodies: Secondary | ICD-10-CM | POA: Diagnosis not present

## 2021-01-17 DIAGNOSIS — F0281 Dementia in other diseases classified elsewhere with behavioral disturbance: Secondary | ICD-10-CM | POA: Diagnosis present

## 2021-01-17 DIAGNOSIS — G309 Alzheimer's disease, unspecified: Secondary | ICD-10-CM | POA: Diagnosis present

## 2021-01-17 DIAGNOSIS — L219 Seborrheic dermatitis, unspecified: Secondary | ICD-10-CM | POA: Diagnosis present

## 2021-01-17 DIAGNOSIS — I5032 Chronic diastolic (congestive) heart failure: Secondary | ICD-10-CM | POA: Diagnosis present

## 2021-01-17 DIAGNOSIS — G20 Parkinson's disease: Secondary | ICD-10-CM | POA: Diagnosis present

## 2021-01-17 DIAGNOSIS — M25462 Effusion, left knee: Secondary | ICD-10-CM | POA: Diagnosis not present

## 2021-01-17 DIAGNOSIS — F05 Delirium due to known physiological condition: Secondary | ICD-10-CM | POA: Diagnosis not present

## 2021-01-17 DIAGNOSIS — I872 Venous insufficiency (chronic) (peripheral): Secondary | ICD-10-CM | POA: Diagnosis present

## 2021-01-17 DIAGNOSIS — Z96651 Presence of right artificial knee joint: Secondary | ICD-10-CM | POA: Diagnosis present

## 2021-01-17 DIAGNOSIS — I110 Hypertensive heart disease with heart failure: Secondary | ICD-10-CM | POA: Diagnosis present

## 2021-01-17 DIAGNOSIS — Z79899 Other long term (current) drug therapy: Secondary | ICD-10-CM

## 2021-01-17 DIAGNOSIS — Z20822 Contact with and (suspected) exposure to covid-19: Secondary | ICD-10-CM | POA: Diagnosis present

## 2021-01-17 DIAGNOSIS — G40909 Epilepsy, unspecified, not intractable, without status epilepticus: Secondary | ICD-10-CM | POA: Diagnosis present

## 2021-01-17 DIAGNOSIS — Z66 Do not resuscitate: Secondary | ICD-10-CM | POA: Diagnosis present

## 2021-01-17 DIAGNOSIS — Z7984 Long term (current) use of oral hypoglycemic drugs: Secondary | ICD-10-CM | POA: Diagnosis not present

## 2021-01-17 DIAGNOSIS — Z7901 Long term (current) use of anticoagulants: Secondary | ICD-10-CM | POA: Diagnosis not present

## 2021-01-17 DIAGNOSIS — M25562 Pain in left knee: Secondary | ICD-10-CM | POA: Diagnosis not present

## 2021-01-17 DIAGNOSIS — I639 Cerebral infarction, unspecified: Secondary | ICD-10-CM | POA: Diagnosis not present

## 2021-01-17 DIAGNOSIS — G9341 Metabolic encephalopathy: Secondary | ICD-10-CM | POA: Diagnosis present

## 2021-01-17 DIAGNOSIS — I6381 Other cerebral infarction due to occlusion or stenosis of small artery: Secondary | ICD-10-CM | POA: Diagnosis present

## 2021-01-17 DIAGNOSIS — I6339 Cerebral infarction due to thrombosis of other cerebral artery: Secondary | ICD-10-CM | POA: Diagnosis not present

## 2021-01-17 DIAGNOSIS — I4821 Permanent atrial fibrillation: Secondary | ICD-10-CM | POA: Diagnosis present

## 2021-01-17 DIAGNOSIS — M10062 Idiopathic gout, left knee: Secondary | ICD-10-CM | POA: Diagnosis not present

## 2021-01-17 DIAGNOSIS — R4182 Altered mental status, unspecified: Secondary | ICD-10-CM | POA: Diagnosis present

## 2021-01-17 DIAGNOSIS — E785 Hyperlipidemia, unspecified: Secondary | ICD-10-CM | POA: Diagnosis present

## 2021-01-17 DIAGNOSIS — I2510 Atherosclerotic heart disease of native coronary artery without angina pectoris: Secondary | ICD-10-CM | POA: Diagnosis present

## 2021-01-17 DIAGNOSIS — I7300 Raynaud's syndrome without gangrene: Secondary | ICD-10-CM | POA: Diagnosis present

## 2021-01-17 DIAGNOSIS — Z833 Family history of diabetes mellitus: Secondary | ICD-10-CM | POA: Diagnosis not present

## 2021-01-17 DIAGNOSIS — R410 Disorientation, unspecified: Secondary | ICD-10-CM

## 2021-01-17 DIAGNOSIS — E119 Type 2 diabetes mellitus without complications: Secondary | ICD-10-CM | POA: Diagnosis present

## 2021-01-17 LAB — MAGNESIUM: Magnesium: 1.6 mg/dL — ABNORMAL LOW (ref 1.7–2.4)

## 2021-01-17 LAB — CBC WITH DIFFERENTIAL/PLATELET
Abs Immature Granulocytes: 0.02 10*3/uL (ref 0.00–0.07)
Basophils Absolute: 0 10*3/uL (ref 0.0–0.1)
Basophils Relative: 1 %
Eosinophils Absolute: 0.1 10*3/uL (ref 0.0–0.5)
Eosinophils Relative: 2 %
HCT: 38.1 % — ABNORMAL LOW (ref 39.0–52.0)
Hemoglobin: 12.4 g/dL — ABNORMAL LOW (ref 13.0–17.0)
Immature Granulocytes: 0 %
Lymphocytes Relative: 13 %
Lymphs Abs: 0.9 10*3/uL (ref 0.7–4.0)
MCH: 31.8 pg (ref 26.0–34.0)
MCHC: 32.5 g/dL (ref 30.0–36.0)
MCV: 97.7 fL (ref 80.0–100.0)
Monocytes Absolute: 0.6 10*3/uL (ref 0.1–1.0)
Monocytes Relative: 10 %
Neutro Abs: 4.8 10*3/uL (ref 1.7–7.7)
Neutrophils Relative %: 74 %
Platelets: 150 10*3/uL (ref 150–400)
RBC: 3.9 MIL/uL — ABNORMAL LOW (ref 4.22–5.81)
RDW: 12.7 % (ref 11.5–15.5)
WBC: 6.5 10*3/uL (ref 4.0–10.5)
nRBC: 0 % (ref 0.0–0.2)

## 2021-01-17 LAB — COMPREHENSIVE METABOLIC PANEL
ALT: 10 U/L (ref 0–44)
AST: 12 U/L — ABNORMAL LOW (ref 15–41)
Albumin: 3.7 g/dL (ref 3.5–5.0)
Alkaline Phosphatase: 71 U/L (ref 38–126)
Anion gap: 12 (ref 5–15)
BUN: 11 mg/dL (ref 8–23)
CO2: 24 mmol/L (ref 22–32)
Calcium: 9.3 mg/dL (ref 8.9–10.3)
Chloride: 102 mmol/L (ref 98–111)
Creatinine, Ser: 0.89 mg/dL (ref 0.61–1.24)
GFR, Estimated: >60 mL/min (ref >60)
Glucose, Bld: 101 mg/dL — ABNORMAL HIGH (ref 70–99)
Potassium: 4.2 mmol/L (ref 3.5–5.1)
Sodium: 138 mmol/L (ref 135–145)
Total Bilirubin: 1.8 mg/dL — ABNORMAL HIGH (ref 0.3–1.2)
Total Protein: 7.3 g/dL (ref 6.5–8.1)

## 2021-01-17 LAB — LIPID PANEL
Cholesterol: 84 mg/dL (ref 0–200)
HDL: 26 mg/dL — ABNORMAL LOW (ref >40)
LDL Cholesterol: 41 mg/dL (ref 0–99)
Total CHOL/HDL Ratio: 3.2 RATIO
Triglycerides: 85 mg/dL (ref <150)
VLDL: 17 mg/dL (ref 0–40)

## 2021-01-17 LAB — URINE CULTURE: Culture: NO GROWTH

## 2021-01-17 LAB — GLUCOSE, CAPILLARY
Glucose-Capillary: 89 mg/dL (ref 70–99)
Glucose-Capillary: 93 mg/dL (ref 70–99)

## 2021-01-17 LAB — FOLATE: Folate: 11.8 ng/mL (ref >5.9)

## 2021-01-17 LAB — PHOSPHORUS: Phosphorus: 2.8 mg/dL (ref 2.5–4.6)

## 2021-01-17 LAB — VITAMIN B12: Vitamin B-12: 465 pg/mL (ref 180–914)

## 2021-01-17 MED ORDER — ADULT MULTIVITAMIN W/MINERALS CH
1.0000 | ORAL_TABLET | Freq: Every day | ORAL | Status: DC
  Administered 2021-01-17 – 2021-01-22 (×6): 1 via ORAL
  Filled 2021-01-17 (×6): qty 1

## 2021-01-17 MED ORDER — MAGNESIUM SULFATE 2 GM/50ML IV SOLN
2.0000 g | Freq: Once | INTRAVENOUS | Status: AC
  Administered 2021-01-17: 14:00:00 2 g via INTRAVENOUS
  Filled 2021-01-17: qty 50

## 2021-01-17 MED ORDER — ENSURE ENLIVE PO LIQD: 237.0000 mL | Freq: Three times a day (TID) | ORAL | Status: DC

## 2021-01-17 MED ORDER — HYDRALAZINE HCL 20 MG/ML IJ SOLN
5.0000 mg | Freq: Four times a day (QID) | INTRAMUSCULAR | Status: DC | PRN
  Administered 2021-01-18: 5 mg via INTRAVENOUS
  Filled 2021-01-17 (×2): qty 1

## 2021-01-17 MED ORDER — SODIUM CHLORIDE 0.9 % IV SOLN: INTRAVENOUS | Status: DC

## 2021-01-17 MED ORDER — APIXABAN 5 MG PO TABS
5.0000 mg | ORAL_TABLET | Freq: Two times a day (BID) | ORAL | Status: DC
  Administered 2021-01-17 – 2021-01-22 (×10): 5 mg via ORAL
  Filled 2021-01-17 (×10): qty 1

## 2021-01-17 MED ORDER — ENSURE ENLIVE PO LIQD
237.0000 mL | Freq: Two times a day (BID) | ORAL | Status: DC
  Administered 2021-01-17 – 2021-01-20 (×6): 237 mL via ORAL

## 2021-01-17 MED ORDER — HALOPERIDOL LACTATE 5 MG/ML IJ SOLN: 5.0000 mg | Freq: Three times a day (TID) | INTRAMUSCULAR | Status: DC | PRN

## 2021-01-17 MED ORDER — QUETIAPINE FUMARATE 25 MG PO TABS
50.0000 mg | ORAL_TABLET | Freq: Every day | ORAL | Status: DC
  Administered 2021-01-17 – 2021-01-21 (×5): 50 mg via ORAL
  Filled 2021-01-17 (×5): qty 2

## 2021-01-17 NOTE — Consult Note (Signed)
ANTICOAGULATION CONSULT NOTE - Follow Up Consult  Pharmacy Consult for apixaban Indication: atrial fibrillation  Allergies  Allergen Reactions  . Doxycycline Swelling  . Lovastatin Swelling    Patient Measurements: Height: 6' (182.9 cm) Weight: 98.1 kg (216 lb 4.3 oz) IBW/kg (Calculated) : 77.6  Vital Signs: Temp: 98.2 F (36.8 C) (04/17 1204) Temp Source: Oral (04/17 1204) BP: 193/91 (04/17 1204) Pulse Rate: 74 (04/17 1204)  Labs: Recent Labs    01/16/21 1105 01/17/21 0654  HGB 12.7* 12.4*  HCT 40.1 38.1*  PLT 147* 150  APTT 36  --   LABPROT 25.7*  --   INR 2.4*  --   CREATININE 0.95 0.89    Estimated Creatinine Clearance: 79 mL/min (by C-G formula based on SCr of 0.89 mg/dL).   Medications:  PTA Xarelto 20 mg daily - last dose 4/15 @ 0800  Assessment:  80 y.o. male with medical history significant for dementia, A. fib on Xarelto, Parkinson's disease, CHF, and seizure disorder who presented with sudden onset altered mental status. Pt passed swallow eval 4/17. Pharmacy has been consulted to transition from enoxaparin to apixaban for afib.   Hgb: 12.4; Plts: 150  Goal of Therapy:  Anti-Xa level 0.6-1 units/ml 4hrs after LMWH dose given Monitor platelets by anticoagulation protocol: Yes   Plan:  Discontinue enoxaparin  Start apixaban 5 mg BID ~12 hours after last dose of enoxaparin (2200) Monitor daily CBC, s/s of bleed  Reatha Armour, PharmD Pharmacy Resident  01/17/2021 12:20 PM

## 2021-01-17 NOTE — Progress Notes (Addendum)
Patient is agitated and tries to get OOB persistently.  Patient's spouse requested medication to calm him down.  Dr. Fran Lowes made aware of the above and place orders.   Seroquel bedtime dose given per MD order.  Decreased lightning, relaxing music, and mittens applied.  Patient calmed down.  He ate dinner and currently is asleep.

## 2021-01-17 NOTE — TOC Initial Note (Signed)
Transition of Care [facility]) - Initial/Assessment Note    Patient Details  Name: Ian King MRN: [id number] Date of Birth: Jun 03, 1941  Transition of Care [facility]) CM/SW Contact:    Liliana Cline, LCSW Phone Number: 01/17/2021, 12:25 PM  Clinical Narrative:                Per chart patient is disoriented. Spoke with patient's wife. She reported patient lives with her and she has 2 private pay sitters to help her provide 24 hour supervision at home. PCP is Dr. Judithann Sheen. Pharmacy is [facility]. Patient has a cane, RW, rollator, wheelchair, and built in shower bench at home. Patient had [facility] in the past. [facility] went to [facility] for short term rehab in the past. Wife provides transportation to appointments. Patient has had 2 COVID vaccines plus the booster. CSW explained recommendation for SNF rehab. Patient's wife is agreeable to this and reported she would prefer [facility] in [place]. She reported she wants to make sure SNF knows that patient does wander in the evenings and wants to make sure whichever SNF patient goes to can accommodate this. CSW started SNF work up.   Expected Discharge Plan: Skilled Nursing Facility Barriers to Discharge: Continued Medical Work up   Patient Goals and CMS Choice Patient states their goals for this hospitalization and ongoing recovery are:: SNF rehab CMS Medicare.gov Compare Post Acute Care list provided to:: Patient Represenative (must comment) Choice offered to / list presented to : Spouse  Expected Discharge Plan and Services Expected Discharge Plan: Skilled Nursing Facility       Living arrangements for the past 2 months: Single Family Home                                      Prior Living Arrangements/Services Living arrangements for the past 2 months: Single Family Home Lives with:: Spouse Patient language and need for interpreter reviewed:: Yes Do you feel safe going back to the place where you live?: Yes      Need for Family  Participation in Patient Care: Yes (Comment) Care giver support system in place?: Yes (comment) Current home services: DME,Sitter Criminal Activity/Legal Involvement Pertinent to Current Situation/Hospitalization: No - Comment as needed  Activities of Daily Living Home Assistive Devices/Equipment: Hearing aid,Wheelchair,Walker (specify type),Dentures (specify type),Cane (specify quad or straight) (Straight cane, Rolator, upper and lower dentures) ADL Screening (condition at time of admission) Patient's cognitive ability adequate to safely complete daily activities?: No Is the patient deaf or have difficulty hearing?: Yes Does the patient have difficulty seeing, even when wearing glasses/contacts?: Yes Does the patient have difficulty concentrating, remembering, or making decisions?: Yes Patient able to express need for assistance with ADLs?: Yes Does the patient have difficulty dressing or bathing?: Yes Independently performs ADLs?: No Communication: Needs assistance Is this a change from baseline?: Pre-admission baseline Dressing (OT): Needs assistance Is this a change from baseline?: Pre-admission baseline Grooming: Needs assistance Is this a change from baseline?: Pre-admission baseline Feeding: Dependent Is this a change from baseline?: Pre-admission baseline Bathing: Needs assistance Is this a change from baseline?: Pre-admission baseline Toileting: Needs assistance Is this a change from baseline?: Pre-admission baseline In/Out Bed: Needs assistance Is this a change from baseline?: Pre-admission baseline Walks in Home: Needs assistance Is this a change from baseline?: Pre-admission baseline Does the patient have difficulty walking or climbing stairs?: Yes Weakness of Legs: Both Weakness  of Arms/Hands: None  Permission Sought/Granted Permission sought to share information with : Oceanographer granted to share information with : Yes, Verbal  Permission Granted (by spouse - patient disoriented x 3)     Permission granted to share info w AGENCY: SNFs        Emotional Assessment       Orientation: : Fluctuating Orientation (Suspected and/or reported Sundowners) Alcohol / Substance Use: Not Applicable Psych Involvement: No (comment)  Admission diagnosis:  Altered mental status, unspecified altered mental status type [R41.82] AMS (altered mental status) [R41.82] Patient Active Problem List   Diagnosis Date Noted  . AMS (altered mental status) 01/16/2021  . Raynaud phenomenon 03/22/2020  . Chronic venous insufficiency 03/22/2020  . B12 deficiency 03/10/2020  . 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 Pseudophakia of left eye 07/02/2018  . Obesity, Class I, BMI 30-34.9 02/15/2018  . Closed displaced fracture of distal phalanx of left middle finger 03/08/2017  . Closed fracture of distal end of left radius with routine healing 01/16/2017  . Closed fracture of nasal bone with routine healing 01/12/2017  . 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  . Closed fracture of dorsal (thoracic) vertebra (HCC) 12/19/2016  . Fall 12/19/2016  . Fracture of left radius 12/19/2016  . Primary osteoarthritis of right knee 10/14/2015  . Acute pain of right knee 09/09/2015  . Aftercare following joint replacement surgery 07/31/2015  . Closed nondisplaced fracture of lateral condyle of femur (HCC) 07/31/2015  . Bilateral edema of lower extremity 06/26/2014  . Chronic atrial fibrillation (HCC) 07/13/2013  . Wound disruption, post-op, skin 10/11/2012  . Cellulitis and abscess 10/05/2012  . Hyperlipidemia 08/17/2012  . Osteoarthritis 08/17/2012  . Seizure disorder (HCC) 08/16/2012  . Hip pain 12/12/2011  . 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 CAD (coronary artery disease) 07/07/2011  . Diabetes mellitus, type 2 (HCC) 07/07/2011  . HTN (hypertension) 07/07/2011   PCP:  Marguarite Arbour, MD Pharmacy:    [facility], [place] - [address] [place] [address] Phone: [phone] Fax: [phone]     Social Determinants of Health (SDOH) Interventions    Readmission Risk Interventions No flowsheet data found.

## 2021-01-17 NOTE — Evaluation (Signed)
Occupational Therapy Evaluation Patient Details Name: Ian King MRN: [id number] DOB: 1941/07/16 Today's Date: 01/17/2021    History of Present Illness presented to ER secondary to acute AMS; admitted for management of acute metabolic encephalopathy related to acute cortical infarct to L anterior frontal lobe.   Clinical Impression   Pt seen for OT evaluation this date in setting of acute hospitalization d/t AMS, found to have infarction. Pt presents this date very fatigued, minimally able to participate with OT at this time and is sleeping in recliner with spouse and another family member present throughout. Pt requires MOD A with ADL transfers, MIN/MOD A for seated UB ADLs and MAX A for LB at this time. He requires several multimodal cues to initiate and sequence tasks throughout. While he does require some assist with ADLs from spouse at baseline, she endorses the he is currently requiring increased assist versus his baseline, especially with ADL transfers. Will continue to follow acutely. Anticipate pt will require STR g/u in SNF setting.     Follow Up Recommendations  SNF    Equipment Recommendations  3 in 1 bedside commode;Tub/shower seat    Recommendations for Other Services       Precautions / Restrictions Precautions Precautions: Fall Restrictions Weight Bearing Restrictions: No      Mobility Bed Mobility               General bed mobility comments: pt up to chair pre/post    Transfers Overall transfer level: Needs assistance Equipment used: Rolling walker (2 wheeled) Transfers: Sit to/from Stand Sit to Stand: Mod assist         General transfer comment: cues for safety, pt is fatigued throughout    Balance Overall balance assessment: Needs assistance Sitting-balance support: No upper extremity supported;Feet supported Sitting balance-Leahy Scale: Fair   Postural control: Posterior lean Standing balance support: Bilateral upper extremity  supported Standing balance-Leahy Scale: Poor                             ADL either performed or assessed with clinical judgement   ADL                                         General ADL Comments: MIN/MOD A for seated UB ADLs, MAX A for seated LB ADLs, cues to initiate and sequence throughout     Vision Patient Visual Report: No change from baseline       Perception     Praxis      Pertinent Vitals/Pain Pain Assessment: No/denies pain     Hand Dominance Left   Extremity/Trunk Assessment Upper Extremity Assessment Upper Extremity Assessment: Overall WFL for tasks assessed;Generalized weakness (ROM WFL, MMT grossly 4-/5. L grip slightly stronger)   Lower Extremity Assessment Lower Extremity Assessment: Defer to PT evaluation;Overall [facility] for tasks assessed;Generalized weakness   Cervical / Trunk Assessment Cervical / Trunk Assessment: Other exceptions Cervical / Trunk Exceptions: h/o neck sx and back sx wtih rods, stiff with limited rotation/truncal lateral flexion   Communication Communication Communication: HOH   Cognition Arousal/Alertness: Awake/alert Behavior During Therapy: Flat affect Overall Cognitive Status: Impaired/Different from baseline                                 General Comments: Oriented  to self, location as hospital and month as April; believes it to be 1981.  Noted confusion regarding current social situation, home environment.  Constantly groaning while at rest, but does stop and appropriately engage in conversation when redirected.   General Comments       Exercises Other Exercises Other Exercises: OT faciltiates ed with pt and family re: role of OT in acute setting. Importance of increasing activity to prevent atrophy and also for general stimulation. family with good understanding   Shoulder Instructions      Home Living Family/patient expects to be discharged to:: Private residence Living  Arrangements: Spouse/significant other Available Help at Discharge: Family Type of Home: House                       Home Equipment: Dan Humphreys - 4 wheels;Cane - single point          Prior Functioning/Environment Level of Independence: Needs assistance  Gait / Transfers Assistance Needed: Intermittent use of SPC vs 4WRW for household distances ADL's / Homemaking Assistance Needed: spouse assists with bathing/dressing tasks, mostly lower body. 2 caregivers that split time during the week            OT Problem List: Decreased strength;Decreased activity tolerance;Impaired balance (sitting and/or standing);Decreased cognition;Decreased safety awareness;Decreased knowledge of use of DME or AE;Decreased knowledge of precautions      OT Treatment/Interventions: Self-care/ADL training;DME and/or AE instruction;Therapeutic activities;Balance training;Therapeutic exercise;Patient/family education    OT Goals(Current goals can be found in the care plan section) Acute Rehab OT Goals Patient Stated Goal: to get stronger OT Goal Formulation: With family Time For Goal Achievement: 01/31/21 Potential to Achieve Goals: Good ADL Goals Pt Will Perform Lower Body Dressing: with min guard assist;sit to/from stand (with <10% cues to sequence/initiate) Pt Will Transfer to Toilet: ambulating;grab bars (with LRAD to [facility] ~10' away) Additional ADL Goal #1: Pt will complete 2 simple/familiar one step tasks with <10% cues to initiate/sequence (use of familiar objects to prompt initation, ex: toothbrush)  OT Frequency: Min 1X/week   Barriers to D/C:            Co-evaluation              AM-PAC OT "6 Clicks" Daily Activity     Outcome Measure Help from another person eating meals?: A Little Help from another person taking care of personal grooming?: A Little Help from another person toileting, which includes using toliet, bedpan, or urinal?: A Lot Help from another person bathing  (including washing, rinsing, drying)?: A Lot Help from another person to put on and taking off regular upper body clothing?: A Little Help from another person to put on and taking off regular lower body clothing?: A Lot 6 Click Score: 15   End of Session Equipment Utilized During Treatment: Gait belt Nurse Communication: Mobility status  Activity Tolerance: Patient tolerated treatment well Patient left: in chair;with call bell/phone within reach;with chair alarm set;with family/visitor present;with nursing/sitter in room  OT Visit Diagnosis: Unsteadiness on feet (R26.81);Muscle weakness (generalized) (M62.81);Other symptoms and signs involving cognitive function                Time: 4270-6237 OT Time Calculation (min): 13 min Charges:  OT General Charges $OT Visit: 1 Visit OT Evaluation $OT Eval Moderate Complexity: 1 Mod OT Treatments $Self Care/Home Management : 8-22 mins  Rejeana Brock, MS, OTR/L ascom [phone] 01/17/21, 3:46 PM

## 2021-01-17 NOTE — Evaluation (Signed)
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  Clinical Impression  Patient resting in bed upon arrival to room; sitter present at bedside.  Patient noted with constant moaning at rest, but does cease and appropriately participate with conversation when engaged.  Oriented to self, location as hospital and month; unaware of year and generally unreliable with regards to social history, home environment. Does follow simple commands, but requires increased time for processing and task initiation, intermittent hand-over-hand to initiate/complete movement.  Bilat UE/LE strength and ROM Grossly symmetrical and WFL for basic transfers and gait; no focal weakness appreciated.  Generally stiff throughout cervical area and trunk; limited dissociation noted with functional movement transitions.  Currently requiring mod assist for bed mobility; mod assist for sit/stand, basic transfers and gait (5' forward/backward) with RW.  Demonstrates short, shuffling steps; limited balance reactions; hand-over-hand to guide/negotiate RW.  Unsafe to attempt without RW and constant hands-on assist at this time due to high fall risk. Would benefit from skilled PT to address above deficits and promote optimal return to PLOF.; recommend transition to STR upon discharge from acute hospitalization.     Follow Up Recommendations SNF    Equipment Recommendations  Rolling walker with 5" wheels;3in1 (PT)    Recommendations for Other Services       Precautions / Restrictions Precautions Precautions: Fall Restrictions Weight Bearing Restrictions: No      Mobility  Bed Mobility Overal bed mobility: Needs Assistance Bed Mobility: Supine to Sit     Supine to sit:  Mod assist     General bed mobility comments: generally stiff throughout trunk, cervical region; often requiring hand-over-hand assist to facilitate movement    Transfers Overall transfer level: Needs assistance Equipment used: Rolling walker (2 wheeled) Transfers: Sit to/from Stand Sit to Stand: Mod assist         General transfer comment: assist for lift off, anterior weight translation  Ambulation/Gait Ambulation/Gait assistance: Min assist;Mod assist Gait Distance (Feet): 5 Feet (forward/backward) Assistive device: Rolling walker (2 wheeled)       General Gait Details: short, shuffling steps; limited balance reactions; hand-over-hand to guide/negotiate RW  Stairs            Wheelchair Mobility    Modified Rankin (Stroke Patients Only)       Balance Overall balance assessment: Needs assistance Sitting-balance support: No upper extremity supported;Feet supported Sitting balance-Leahy Scale: Fair   Postural control: Posterior lean Standing balance support: Bilateral upper extremity supported Standing balance-Leahy Scale: Poor                               Pertinent Vitals/Pain Pain Assessment: No/denies pain    Home Living Family/patient expects to be discharged to:: Private residence Living Arrangements: Spouse/significant other Available Help at Discharge: Family Type of Home: House         Home Equipment: Environmental consultant - 4 wheels;Cane - single point      Prior Function Level of Independence: Independent with assistive device(s)         Comments: Intermittent use of SPC vs 4WRW for household distances; assist as needed for ADLs and household activities.  Has 2-caregivers that split time during days throughout the week; wife available in the  home overnight.     Hand Dominance   Dominant Hand: Left    Extremity/Trunk Assessment   Upper Extremity Assessment Upper Extremity Assessment: Overall WFL for tasks assessed (grossly at  least 4-/5 throughout; no focal weakness appreciated)    Lower Extremity Assessment Lower Extremity Assessment: Overall WFL for tasks assessed (grossly at least 4-/5 throughout; no focal weakness appreciated)       Communication   Communication: HOH  Cognition Arousal/Alertness: Awake/alert Behavior During Therapy: Flat affect Overall Cognitive Status: Impaired/Different from baseline                                 General Comments: Oriented to self, location as hospital and month as April; believes it to be 1981.  Noted confusion regarding current social situation, home environment.  Constantly groaning while at rest, but does stop and appropriately engage in conversation when redirected.      General Comments      Exercises Other Exercises Other Exercises: Bed/chair transfer, ambulatory with RW, min/mod assist Other Exercises: Oral care with toothette and oral rinse, dependant assist. Encouraged for coughing and assisted with spitting out secretions.  Noted with whitish discoloration to tongue; may benefit from mouthwash/rinse.  RN informed/aware.   Assessment/Plan    PT Assessment Patient needs continued PT services  PT Problem List Decreased strength;Decreased activity tolerance;Decreased balance;Decreased mobility;Decreased coordination;Decreased cognition;Decreased knowledge of use of DME;Decreased safety awareness;Decreased knowledge of precautions;Cardiopulmonary status limiting activity       PT Treatment Interventions DME instruction;Gait training;Functional mobility training;Therapeutic activities;Therapeutic exercise;Balance training;Patient/family education    PT Goals (Current goals can be found in the Care Plan section)  Acute Rehab PT Goals Patient Stated Goal: to get stronger PT Goal Formulation: With patient/family Time For Goal Achievement: 01/31/21 Potential to Achieve Goals: Good    Frequency Min 2X/week   Barriers to discharge         Co-evaluation               AM-PAC PT "6 Clicks" Mobility  Outcome Measure Help needed turning from your back to your side while in a flat bed without using bedrails?: A Lot Help needed moving from lying on your back to sitting on the side of a flat bed without using bedrails?: A Lot Help needed moving to and from a bed to a chair (including a wheelchair)?: A Lot Help needed standing up from a chair using your arms (e.g., wheelchair or bedside chair)?: A Lot Help needed to walk in hospital room?: A Lot Help needed climbing 3-5 steps with a railing? : A Lot 6 Click Score: 12    End of Session Equipment Utilized During Treatment: Gait belt Activity Tolerance: Patient tolerated treatment well Patient left: in chair;with call bell/phone within reach;with family/visitor present;with nursing/sitter in room Nurse Communication: Mobility status PT Visit Diagnosis: Muscle weakness (generalized) (M62.81);Difficulty in walking, not elsewhere classified (R26.2)    Time: 4010-2725 PT Time Calculation (min) (ACUTE ONLY): 31 min   Charges:   PT Evaluation $PT Eval Moderate Complexity: 1 Mod PT Treatments $Therapeutic Activity: 8-22 mins        Rolf Fells H. Manson Passey, PT, DPT, NCS 01/17/21, 10:27 AM [phone]

## 2021-01-17 NOTE — Progress Notes (Signed)
NEUROLOGY CONSULTATION PROGRESS NOTE   Date of service: January 17, 2021 Patient Name: Ian King MRN:  [id number] DOB:  1941/09/25  Brief HPI   BRYNE LINDON is a 80 y.o. 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 Was seen by wife to go to tale a shower at 0900.   Interval Hx    Spoke to daughter last night. Please see my note from last night for full details. He has not have any episodes concerning for a seizure in 40 years.  Today, he is more awake, talkative. Continues to groan but pauses and answers questions and follows commands and then resumes groaning.  Vitals   Vitals:   01/16/21 2342 01/17/21 0407 01/17/21 0804 01/17/21 1204  BP: (!) 175/91 (!) 188/93 (!) 170/98 (!) 193/91  Pulse: 80 77 84 74  Resp: 19 20 20 20   Temp: 98.4 F (36.9 C) 98.1 F (36.7 C) (!) 97.5 F (36.4 C) 98.2 F (36.8 C)  TempSrc:   Axillary Oral  SpO2: 96% 96% 99% 97%  Weight:      Height:         Body mass index is 29.33 kg/m.  Physical Exam   General: Laying comfortably in bed; in no acute distress.  HENT: Normal oropharynx and mucosa. Normal external appearance of ears and nose.  Neck: Supple, no pain or tenderness  CV: No JVD. No peripheral edema.  Pulmonary: Symmetric Chest rise. Normal respiratory effort.  Abdomen: Soft to touch, non-tender. Ext: No cyanosis, edema, or deformity Skin: No rash. Normal palpation of skin.   Musculoskeletal: Normal digits and nails by inspection. No clubbing.   Neurologic Examination  Mental status/Cognition: Alert, oriented to self, place, month but not to year. Speech/language: Fluent, comprehension intact, object naming intact. Cranial nerves:   CN II Pupils equal and reactive to light, no VF deficits   CN III,IV,VI EOM intact, no gaze preference or deviation, no nystagmus   CN V normal sensation in  V1, V2, and V3 segments bilaterally   CN VII no asymmetry, no nasolabial fold flattening   CN VIII normal hearing to speech   CN IX & X normal palatal elevation, no uvular deviation   CN XI    CN XII midline tongue protrusion   Motor:  Muscle bulk: poor, tone increased with some cogwheeling. Mvmt Root Nerve  Muscle Right Left Comments  SA C5/6 Ax Deltoid 5 5   EF C5/6 Mc Biceps     EE C6/7/8 Rad Triceps     WF C6/7 Med FCR     WE C7/8 PIN ECU     F Ab C8/T1 U ADM/FDI 5 5   HF L1/2/3 Fem Illopsoas 5 5   KE L2/3/4 Fem Quad     DF L4/5 D Peron Tib Ant     PF S1/2 Tibial Grc/Sol      Reflexes:  Right Left Comments  Pectoralis      Biceps (C5/6) 2 2   Brachioradialis (C5/6) 2 2    Triceps (C6/7) 2 2    Patellar (L3/4) 2 2    Achilles (S1)      Hoffman      Plantar     Jaw jerk    Sensation:  Light touch intact   Pin prick    Temperature    Vibration   Proprioception  Coordination/Complex Motor:  - Finger to Nose intact BL  Labs   Basic Metabolic Panel:  Lab Results  Component Value Date   NA 138 01/17/2021   K 4.2 01/17/2021   CO2 24 01/17/2021   GLUCOSE 101 (H) 01/17/2021   BUN 11 01/17/2021   CREATININE 0.89 01/17/2021   CALCIUM 9.3 01/17/2021   GFRNONAA >60 01/17/2021   HbA1c: No results found for: HGBA1C LDL:  Lab Results  Component Value Date   LDLCALC 41 01/17/2021   Urine Drug Screen: No results found for: LABOPIA, COCAINSCRNUR, LABBENZ, AMPHETMU, THCU, LABBARB  Alcohol Level No results found for: ETH No results found for: PHENYTOIN, ZONISAMIDE, LAMOTRIGINE, LEVETIRACETA No results found for: PHENYTOIN, PHENOBARB, VALPROATE, CBMZ  Imaging and Diagnostic studies   MRI Brain without contrast:  Motion degraded examination, as described.  Punctate acute cortical infarct within the anterior left frontal lobe.  Small chronic cortically based infarcts within the bilateral frontal, parietal and occipital lobes.  Additional chronic  infarcts within the right corona radiata/basal ganglia, left thalamus and left cerebellar hemisphere.  Background moderate cerebral white matter chronic small vessel ischemic disease.  Moderate to moderately advanced cerebral atrophy with comparatively mild cerebellar atrophy.  REEG: Pending.  Impression   Ian King is a 80 y.o. 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 Vital and glucose normal, chemistry and CBC non revealing, MRI Brain with a small punctate left frontal lobe infarct that is unlikely to explain this. Althou seizure could be a consideration, he has not had one in 40+ years per family.  Suspect that his underlying dementia likely had a role to play in this.  He also has a rash on the Right forehead that is new after attempting to transition him to Eliquis. I do not see any oozing or crusting, no vesicles concerning for potential herpes zoster.  Recommendations  - recommend routine EEG. Can be done outpatient. Suspicion for seizure is low based on the fact that he has not had any seizures in 40 years. - Will continue home Keppra 1G BID. STOP Lamictal. Will defer to outpatient neurologist on whether appropriate to take him off AEDs completely and see how he does. - Family brought up that patient continues to have strokes on Xarelto. He is compliant with his regimen. I think he is too high risk for Warfarin. Will switch to Eliquis 5mg  BID but I do not think that is any significant difference in the efficacy between Xarelto or Eliquis. 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 - TSH and Ammonia normal. B12 and folic acid levels ordered and pending. ______________________________________________________________________   Thank you for the opportunity to take part in the  care of this patient. If you have any further questions, please contact the neurology consultation attending.  Signed,  [facility] Pager Number Erick Blinks

## 2021-01-17 NOTE — Care Management Obs Status (Signed)
MEDICARE OBSERVATION STATUS NOTIFICATION   Patient Details  Name: Ian King MRN: [id number] Date of Birth: Jun 27, 1941   Medicare Observation Status Notification Given:  Yes  Reviewed with wife via phone with her permission.  [name], LCSW 01/17/2021, 12:16 PM

## 2021-01-17 NOTE — NC FL2 (Signed)
[place] MEDICAID FL2 LEVEL OF CARE SCREENING TOOL     IDENTIFICATION  Patient Name: SUEDE King Birthdate: [birth year] Sex: male Admission Date (Current Location): 01/16/2021  [place] and [place] Number:  Chiropodist and Address:  [facility], [address] [place], [place] [address]      Provider Number: [id number]  Attending Physician Name and Address:  Darlin Priestly, MD  Relative Name and Phone Number:  Peter, Keyworth (Spouse)   [phone] [facility])    Current Level of Care: Hospital Recommended Level of Care: Skilled Nursing Facility Prior Approval Number:    Date Approved/Denied:   PASRR Number: [id number]  Discharge Plan:      Current Diagnoses: Patient Active Problem List   Diagnosis Date Noted  . AMS (altered mental status) 01/16/2021  . Raynaud phenomenon 03/22/2020  . Chronic venous insufficiency 03/22/2020  . B12 deficiency 03/10/2020  . 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 Pseudophakia of left eye 07/02/2018  . Obesity, Class I, BMI 30-34.9 02/15/2018  . Closed displaced fracture of distal phalanx of left middle finger 03/08/2017  . Closed fracture of distal end of left radius with routine healing 01/16/2017  . Closed fracture of nasal bone with routine healing 01/12/2017  . 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  . Closed fracture of dorsal (thoracic) vertebra (HCC) 12/19/2016  . Fall 12/19/2016  . Fracture of left radius 12/19/2016  . Primary osteoarthritis of right knee 10/14/2015  . Acute pain of right knee 09/09/2015  . Aftercare following joint replacement surgery 07/31/2015  . Closed nondisplaced fracture of lateral condyle of femur (HCC) 07/31/2015  . Bilateral edema of lower extremity 06/26/2014  . Chronic atrial fibrillation (HCC) 07/13/2013  . Wound disruption, post-op, skin 10/11/2012  . Cellulitis and abscess 10/05/2012  . Hyperlipidemia 08/17/2012  .  Osteoarthritis 08/17/2012  . Seizure disorder (HCC) 08/16/2012  . Hip pain 12/12/2011  . 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 CAD (coronary artery disease) 07/07/2011  . Diabetes mellitus, type 2 (HCC) 07/07/2011  . HTN (hypertension) 07/07/2011    Orientation RESPIRATION BLADDER Height & Weight     Self  O2 External catheter Weight: 216 lb 4.3 oz (98.1 kg) Height:  6' (182.9 cm)  BEHAVIORAL SYMPTOMS/MOOD NEUROLOGICAL BOWEL NUTRITION STATUS  Wanderer (wandering associated with sundowning per wife)     Diet (regular diet; thin liquids)  AMBULATORY STATUS COMMUNICATION OF NEEDS Skin   Limited Assist Verbally Other (Comment) (rash/dry skin)                       Personal Care Assistance Level of Assistance  Bathing,Feeding,Dressing Bathing Assistance: Limited assistance Feeding assistance: Limited assistance Dressing Assistance: Limited assistance     Functional Limitations Info             SPECIAL CARE FACTORS FREQUENCY  PT (By licensed PT),OT (By licensed OT)     PT Frequency: 5 x/week OT Frequency: 5 x/week            Contractures      Additional Factors Info  Code Status,Allergies Code Status Info: DNR Allergies Info: doxycycline, lovastatin           Current Medications (01/17/2021):  This is the current hospital active medication list Current Facility-Administered Medications  Medication Dose Route Frequency Provider Last Rate Last Admin  . apixaban (ELIQUIS) tablet 5 mg  5 mg Oral BID Reatha Armour, [facility]      . atorvastatin (LIPITOR)  tablet 80 mg  80 mg Oral QHS Hall, Carole N, DO      . hydrALAZINE (APRESOLINE) injection 5 mg  5 mg Intravenous Q6H PRN Dow Adolph N, DO      . levETIRAcetam (KEPPRA) IVPB 1000 mg/100 mL premix  1,000 mg Intravenous Q12H Erick Blinks, MD 400 mL/hr at 01/17/21 1123 1,000 mg at 01/17/21 1123  . LORazepam (ATIVAN) injection 2 mg  2 mg Intravenous Q6H PRN Dow Adolph N, DO      . metoprolol tartrate  (LOPRESSOR) tablet 25 mg  25 mg Oral BID Dow Adolph N, DO   25 mg at 01/17/21 1218  . QUEtiapine (SEROQUEL) tablet 25 mg  25 mg Oral BID Dow Adolph N, DO   25 mg at 01/17/21 1218  . rivastigmine (EXELON) capsule 1.5 mg  1.5 mg Oral BID Dow Adolph N, DO   1.5 mg at 01/17/21 1217     Discharge Medications: Please see discharge summary for a list of discharge medications.  Relevant Imaging Results:  Relevant Lab Results:   Additional Information SS #: [id number]  Karmel Patricelli E Kasy Iannacone, LCSW

## 2021-01-17 NOTE — Progress Notes (Signed)
Notified Dr. Fran Lowes that pt passed the swallow screen.  Per MD to order regular diet.

## 2021-01-17 NOTE — Progress Notes (Incomplete)
PROGRESS NOTE    Ian King  LPF:[id number] DOB: September 03, 1941 DOA: 01/16/2021 PCP: Marguarite Arbour, MD  105A/105A-BB   Assessment & Plan:   Active Problems:   AMS (altered mental status)   Stroke (cerebrum) (HCC)    Ian King is a 80 y.o. male with medical history significant for dementia, permanent A. fib on Xarelto, Parkinson's disease, chronic diastolic CHF, seizure disorder on AEDs, who presented to [facility] ED via EMS from home due to sudden onset altered mental status in the morning.  Pt was noted to be more confused and moaning while she was giving him a shower.  While in the ER a code stroke was called.  His altered mental status gradually improved.     Acute metabolic encephalopathy in the setting of acute punctate cortical infarct within the anterior left frontal lobe. Last known well 9 AM this morning. 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 Code stroke was called on arrival to the ED today. Seen by neurology, recommended MRI brain and EEG. MRI brain revealed the findings as stated above. Okay to restart Xarelto per neurology, likely not a failure of Xarelto, the patient skipped his dose this morning. Further recommendations per neurology. Frequent neuro-checks NIH score scale. PT/OT/speech evaluation  Acute punctate cortical infarct within the anterior left frontal lobe. Management per neurology. Continue home Xarelto Increase home Lipitor to 80 mg daily. Permissive hypertension up to 220/110. Obtain medical records of prior stroke work-up from [facility] Obtain fasting lipid panel and A1c in the morning  Permanent A. fib on Xarelto Currently rate controlled on Lopressor 25 mg twice daily. On Xarelto for secondary CVA prevention  Seizure disorder EEG ordered per Neurology Continue Keppra as recommended by neurology Patient is also on Lamictal at home No reported recent seizure activity Management per  neurology  Dementia/Parkinson disease Resume home regimen  History of sundowning Reported by his wife, she requests a Electrical engineer in place Fall precautions Delirium precautions.  Dysphagia Per bedside RN patient failed his swallow evaluation at bedside Keep n.p.o. until passes swallow evaluation by speech therapist Aspiration precautions  Hyperlipidemia Obtain fasting lipid panel Resume home Lipitor, increased dose to 80 mg daily  Chronic diastolic CHF He is on home p.o. Lasix, hold off for now Last hospitalization was at [facility] when he was diagnosed with acute CVA in June 2021 Medical records requested   DVT prophylaxis: {DVT PROPHYLAXIS:304960173} Code Status: {Palliative Code status:23503}  Family Communication: *** Level of care: Med-Surg Dispo:   The patient is from: *** Anticipated d/c is to: *** Anticipated d/c date is: *** Patient currently is not medically ready to d/c due to: ***   Subjective and Interval History:  Pt was alert, passed bed-side swallow.  Due to dementia, family answered all questions for the pt.  Later, pt was found to be agitated and trying to get out of bed.  Wife asked for pt to be sedated.   Objective: Vitals:   01/17/21 1848 01/17/21 1850 01/17/21 2025 01/17/21 2323  BP: (!) 182/96 (!) 173/89 (!) 177/65 (!) 165/85  Pulse: 75 72 78 62  Resp: 18  16 16   Temp: 98.6 F (37 C)  100 F (37.8 C) 99.3 F (37.4 C)  TempSrc:   Axillary Axillary  SpO2:      Weight:      Height:        Intake/Output Summary (Last 24 hours) at 01/17/2021 2357 Last  data filed at 01/17/2021 1312 Gross per 24 hour  Intake 1453.1 ml  Output 900 ml  Net 553.1 ml   Filed Weights   01/16/21 1051 01/16/21 1445  Weight: 100.1 kg 98.1 kg    Examination: ***  Constitutional: NAD, alert, not oriented HEENT: conjunctivae and lids normal, EOMI CV: No cyanosis.   RESP: normal respiratory effort, on RA Extremities: No effusions, edema in  BLE SKIN: warm, dry.  Rash with peeling skin on the face  Neuro: II - XII grossly intact.     Data Reviewed: I have personally reviewed following labs and imaging studies  CBC: Recent Labs  Lab 01/16/21 1105 01/17/21 0654  WBC 6.4 6.5  NEUTROABS 4.7 4.8  HGB 12.7* 12.4*  HCT 40.1 38.1*  MCV 99.8 97.7  PLT 147* 150   Basic Metabolic Panel: Recent Labs  Lab 01/16/21 1105 01/17/21 0654  NA 140 138  K 3.9 4.2  CL 103 102  CO2 29 24  GLUCOSE 121* 101*  BUN 12 11  CREATININE 0.95 0.89  CALCIUM 9.9 9.3  MG  --  1.6*  PHOS  --  2.8   GFR: Estimated Creatinine Clearance: 79 mL/min (by C-G formula based on SCr of 0.89 mg/dL). Liver Function Tests: Recent Labs  Lab 01/16/21 1105 01/17/21 0654  AST 12* 12*  ALT 9 10  ALKPHOS 76 71  BILITOT 1.1 1.8*  PROT 7.8 7.3  ALBUMIN 4.1 3.7   No results for input(s): LIPASE, AMYLASE in the last 168 hours. Recent Labs  Lab 01/16/21 1106  AMMONIA 27   Coagulation Profile: Recent Labs  Lab 01/16/21 1105  INR 2.4*   Cardiac Enzymes: No results for input(s): CKTOTAL, CKMB, CKMBINDEX, TROPONINI in the last 168 hours. BNP (last 3 results) No results for input(s): PROBNP in the last 8760 hours. HbA1C: No results for input(s): HGBA1C in the last 72 hours. CBG: Recent Labs  Lab 01/16/21 1025 01/16/21 2027 01/16/21 2346 01/17/21 0434 01/17/21 0755  GLUCAP 114* 96 95 89 93   Lipid Profile: Recent Labs    01/17/21 0654  CHOL 84  HDL 26*  LDLCALC 41  TRIG 85  CHOLHDL 3.2   Thyroid Function Tests: Recent Labs    01/16/21 1105  TSH 1.117   Anemia Panel: Recent Labs    01/17/21 1352  VITAMINB12 465  FOLATE 11.8   Sepsis Labs: Recent Labs  Lab 01/16/21 1110 01/16/21 1515  PROCALCITON  --  <0.10  LATICACIDVEN 1.3  --     Recent Results (from the past 240 hour(s))  Urine Culture     Status: None   Collection Time: 01/16/21 11:05 AM   Specimen: Urine, Random  Result Value Ref Range Status    Specimen Description   Final    URINE, RANDOM Performed at [facility], [address], [place], [place] [address]    Special Requests   Final    NONE Performed at [facility], [address], [place], [place] [address]    Culture   Final    NO GROWTH Performed at [facility], [address], [place], [place] [address]    Report Status 01/17/2021 FINAL  Final  Resp Panel by RT-PCR (Flu A&B, Covid) Nasopharyngeal Swab     Status: None   Collection Time: 01/16/21 11:10 AM   Specimen: Nasopharyngeal Swab; Nasopharyngeal(NP) swabs in vial transport medium  Result Value Ref Range Status   SARS Coronavirus 2 by RT PCR NEGATIVE NEGATIVE Final  Comment: (NOTE) SARS-CoV-2 target nucleic acids are NOT DETECTED.  The SARS-CoV-2 RNA is generally detectable in upper respiratory specimens during the acute phase of infection. The lowest concentration of SARS-CoV-2 viral copies this assay can detect is 138 copies/mL. A negative result does not preclude SARS-Cov-2 infection and should not be used as the sole basis for treatment or other patient management decisions. A negative result may occur with  improper specimen collection/handling, submission of specimen other than nasopharyngeal swab, presence of viral mutation(s) within the areas targeted by this assay, and inadequate number of viral copies(<138 copies/mL). A negative result must be combined with clinical observations, patient history, and epidemiological information. The expected result is Negative.  Fact Sheet for Patients:  BloggerCourse.comhttps://www.fda.gov/media/152166/download  Fact Sheet for Healthcare Providers:  SeriousBroker.ithttps://www.fda.gov/media/152162/download  This test is no t yet approved or cleared by the [place] FDA and  has been authorized for detection and/or diagnosis of SARS-CoV-2 by FDA under an Emergency Use Authorization (EUA). This EUA will remain  in effect (meaning this test can be used) for  the duration of the COVID-19 declaration under Section 564(b)(1) of the Act, 21 U.S.C.section 360bbb-3(b)(1), unless the authorization is terminated  or revoked sooner.       Influenza A by PCR NEGATIVE NEGATIVE Final   Influenza B by PCR NEGATIVE NEGATIVE Final    Comment: (NOTE) The Xpert Xpress SARS-CoV-2/FLU/RSV plus assay is intended as an aid in the diagnosis of influenza from Nasopharyngeal swab specimens and should not be used as a sole basis for treatment. Nasal washings and aspirates are unacceptable for Xpert Xpress SARS-CoV-2/FLU/RSV testing.  Fact Sheet for Patients: BloggerCourse.comhttps://www.fda.gov/media/152166/download  Fact Sheet for Healthcare Providers: SeriousBroker.ithttps://www.fda.gov/media/152162/download  This test is not yet approved or cleared by the [place] FDA and has been authorized for detection and/or diagnosis of SARS-CoV-2 by FDA under an Emergency Use Authorization (EUA). This EUA will remain in effect (meaning this test can be used) for the duration of the COVID-19 declaration under Section 564(b)(1) of the Act, 21 U.S.C. section 360bbb-3(b)(1), unless the authorization is terminated or revoked.  Performed at [facility], [address] [place], [place] [id number]       Radiology Studies: MR BRAIN WO CONTRAST  Result Date: 01/16/2021 CLINICAL DATA:  Neuro deficit, acute, stroke suspected. EXAM: MRI HEAD WITHOUT CONTRAST TECHNIQUE: Multiplanar, multiecho pulse sequences of the brain and surrounding structures were obtained without intravenous contrast. COMPARISON:  Prior head CT examinations 01/16/2021 and earlier. FINDINGS: Brain: Examination is intermittently motion degraded. Most notably, there is moderate motion degradation of the coronal diffusion-weighted sequence, moderate motion degradation of the axial T2 weighted sequence, moderate motion degradation of the axial T1 weighted sequence. Moderate to moderately advanced cerebral atrophy.  Comparatively mild cerebellar atrophy. Commensurate prominence of the ventricles and sulci. There is a punctate acute cortical infarct within the anterior left frontal lobe (series 5, image 39). Small chronic cortically based infarcts within the bilateral frontal, parietal and occipital lobes. Chronic infarct within the right corona radiata/basal ganglia. Chronic lacunar infarct within the left thalamus. Background moderate multifocal T2/FLAIR hyperintensity within the cerebral white matter is nonspecific, but compatible with chronic small vessel ischemic disease. Small chronic infarcts within the bilateral cerebellar hemispheres No evidence of intracranial mass. No chronic intracranial blood products. No extra-axial fluid collection. No midline shift. Vascular: Expected proximal arterial flow voids. Skull and upper cervical spine: No focal marrow lesion. Ligamentous hypertrophy/pannus formation posterior to the dens. Sinuses/Orbits: Visualized orbits show no acute finding. Mild right ethmoid  sinus mucosal thickening. IMPRESSION: Motion degraded examination, as described. Punctate acute cortical infarct within the anterior left frontal lobe. Small chronic cortically based infarcts within the bilateral frontal, parietal and occipital lobes. Additional chronic infarcts within the right corona radiata/basal ganglia, left thalamus and left cerebellar hemisphere. Background moderate cerebral white matter chronic small vessel ischemic disease. Moderate to moderately advanced cerebral atrophy with comparatively mild cerebellar atrophy. Electronically Signed   By: Jackey Loge DO   On: 01/16/2021 14:21   DG Chest Portable 1 View  Result Date: 01/16/2021 CLINICAL DATA:  Altered mental status. EXAM: PORTABLE CHEST 1 VIEW COMPARISON:  None. No prior films are available on PACs for comparison. FINDINGS: Status post prior median sternotomy. The heart size is mildly enlarged. The mediastinal contour is otherwise unremarkable.  Mild increased central pulmonary vessel caliber is identified. There is no focal pneumonia or pleural effusion. Spinal rods are noted. Degenerative joint changes of bilateral shoulders are identified. IMPRESSION: Mild increased central pulmonary vessel caliber. This can be seen in mild vascular congestion. Electronically Signed   By: Sherian Rein M.D.   On: 01/16/2021 11:44   CT HEAD CODE STROKE WO CONTRAST  Addendum Date: 01/16/2021   ADDENDUM REPORT: 01/16/2021 10:51 ADDENDUM: Study discussed by telephone with Dr. Antoine Primas on 01/16/2021 at 1047 hours. Electronically Signed   By: Odessa Fleming M.D.   On: 01/16/2021 10:51   Result Date: 01/16/2021 CLINICAL DATA:  Code stroke. 80 year old male last known normal 0900 hours. Sudden onset altered mental status. EXAM: CT HEAD WITHOUT CONTRAST TECHNIQUE: Contiguous axial images were obtained from the base of the skull through the vertex without intravenous contrast. COMPARISON:  Head CT 11/21/2020 FINDINGS: Brain: Stable cerebral volume. No midline shift, mass effect, or evidence of intracranial mass lesion. Stable ventricle size and configuration. No acute intracranial hemorrhage identified. Chronic encephalomalacia in the right basal ganglia and along the left parieto-occipital sulcus compatible with prior ischemia. Likewise small areas of stable chronic cortical at the superior right parietal and superior right occipital lobes, also the anterior right frontal lobe series 3, image 23. Stable possible small chronic lacunar infarct of the left thalamus. Elsewhere stable patchy white matter hypodensity. No cortically based acute infarct identified. Vascular: Extensive Calcified atherosclerosis at the skull base. No suspicious intracranial vascular hyperdensity. Skull: No acute osseous abnormality identified. Sinuses/Orbits: Visualized paranasal sinuses and mastoids are stable and well aerated. Other: No acute orbit or scalp soft tissue finding. ASPECTS [facility] Stroke  Program Early CT Score) Total score (0-10 with 10 being normal): 10 (chronic encephalomalacia). IMPRESSION: 1. Stable CT appearance of bilateral chronic ischemic disease. 2. No acute cortically based infarct or acute intracranial hemorrhage identified. ASPECTS 10. Electronically Signed: By: Odessa Fleming M.D. On: 01/16/2021 10:42     Scheduled Meds: . apixaban  5 mg Oral BID  . atorvastatin  80 mg Oral QHS  . feeding supplement  237 mL Oral BID BM  . metoprolol tartrate  25 mg Oral BID  . multivitamin with minerals  1 tablet Oral Daily  . QUEtiapine  50 mg Oral QHS  . rivastigmine  1.5 mg Oral BID   Continuous Infusions: . levETIRAcetam Stopped (01/17/21 2256)     LOS: 0 days     Darlin Priestly, MD [facility] If 7PM-7AM, please contact night-coverage 01/17/2021, 11:57 PM

## 2021-01-17 NOTE — Progress Notes (Signed)
Initial Nutrition Assessment  DOCUMENTATION CODES:   Not applicable  INTERVENTION:   -Ensure Enlive po BID, each supplement provides 350 kcal and 20 grams of protein -MVI with minerals daily -Feeding assistance with melas  NUTRITION DIAGNOSIS:   Increased nutrient needs related to chronic illness (CHF) as evidenced by estimated needs.  GOAL:   Patient will meet greater than or equal to 90% of their needs  MONITOR:   PO intake,Supplement acceptance,Labs,Weight trends,Skin,I & O's  REASON FOR ASSESSMENT:   Malnutrition Screening Tool    ASSESSMENT:   Ian King is a 80 y.o. male with medical history significant for dementia, permanent A. fib on Xarelto, Parkinson's disease, chronic diastolic CHF, seizure disorder on AEDs, who presented to [facility] ED via EMS from home due to sudden onset altered mental status with onset at 9 AM this morning.  Pt admitted with acute metabolic encephalopathy in the setting of acute punctuate cortical infarct within the anterior lt frontal lobe.   4/16- failed swallow screen 4/17- swallow screen passed, advanced to regular diet  Reviewed I/O's: -870 ml x 24 hours  UOP: 1.5 L x 24 hours  Pt unavailable at time of visit. Attempted to speak with pt via call to hospital room phone, however, unable to reach.   No meal completion records to review at this time. Given pt's history of dementia, suspect inadequate intake during hospitalization.  Reviewed wt hx; wt has been stable over the past month.   Medications reviewed.   Per TOC notes, plan for SNF placement once medically stable for discharge.   No results found for: HGBA1C PTA DM medications are 1000 mg metformin BID.   Labs reviewed: Mg: 1.6 (on IV supplementation), CBGS: 89-95(inpatient orders for glycemic control are none).   Diet Order:   Diet Order            Diet regular Room service appropriate? Yes; Fluid consistency: Thin  Diet effective now                  EDUCATION NEEDS:   No education needs have been identified at this time  Skin:  Skin Assessment: Reviewed RN Assessment  Last BM:  01/16/21  Height:   Ht Readings from Last 1 Encounters:  01/16/21 6' (1.829 m)    Weight:   Wt Readings from Last 1 Encounters:  01/16/21 98.1 kg    Ideal Body Weight:  80.9 kg  BMI:  Body mass index is 29.33 kg/m.  Estimated Nutritional Needs:   Kcal:  2150-2350  Protein:  105-120 grams  Fluid:  > 2 L    Levada Schilling, RD, LDN, CDCES Registered Dietitian II Certified Diabetes Care and Education Specialist Please refer to [facility] for RD and/or RD on-call/weekend/after hours pager

## 2021-01-17 NOTE — Progress Notes (Signed)
PROGRESS NOTE    Ian King  PJK:[id number] DOB: May 14, 1941 DOA: 01/16/2021 PCP: Marguarite Arbour, MD  105A/105A-BB   Assessment & Plan:   Active Problems:   AMS (altered mental status)   Stroke (cerebrum) (HCC)    Ian King is a 80 y.o. male with medical history significant for dementia, permanent A. fib on Xarelto, Parkinson's disease, chronic diastolic CHF, seizure disorder on AEDs, who presented to [facility] ED via EMS from home due to sudden onset altered mental status in the morning.  Pt was noted to be more confused and moaning while she was giving him a shower.  While in the ER a code stroke was called.  His altered mental status gradually improved.     AMS --unlikely to be due to the small punctate strokes.  Also not likely to be from seizure since last episode was 40+ years ago.  AMS likely part of the disease process of his underlying Lewy Body Dementia. --Mental status back to baseline the next day.  Acute punctate cortical infarct within the anterior left frontal lobe, likely embolic History of prior CVA in June 2021, was admitted at [facility]. Code stroke was called on arrival to the ED today. --pt was already on Xarelto PTA. Plan: --switch to Eliquis since pt appeared to have embolic strokes while on Xarelto --cont statin --PT/OT rec SNF rehab  Lewy body Dementia with behavioral disturbance --Patient is in the process of being transitioned from keppra 1g bid to Lamotrigine for behavioral issues including aggression. Family noted a facial rash during this transition. Per neuro, Lamictal will not be restarted inpatient with plan to transition to a different anti seizure medicine outpatient. Plan: --cont Keppra for now, per neuro --consider Pimavanserin (Nuplazid) outpatient  --increase seroquel to 50 mg nightly  Hospital delirium --Pt increasing more agitated and trying to get out of bed --increase seroquel to 50 mg nightly --IV haldol PRN  Permanent A. fib on  Xarelto Currently rate controlled  --cont home Lopressor 25 mg BID --switch to Eliquis today  Hx of Seizure  --last episode >40 years ago.   --spot EEG --cont keppra for now  Hyperlipidemia --cont statin  Chronic diastolic CHF --stable --hold home lasix for now   DVT prophylaxis: YK:DXIPJAS Code Status: DNR  Family Communication: wife and son updated at bedside Level of care: Med-Surg Dispo:   The patient is from: home Anticipated d/c is to: SNF Anticipated d/c date is: whenever bed available Patient currently is medically ready to d/c.   Subjective and Interval History:  Pt was alert, passed bed-side swallow.  Due to dementia, family answered all questions for the pt.  Later, pt was found to be agitated and trying to get out of bed.  Wife asked for pt to be sedated.   Objective: Vitals:   01/17/21 1848 01/17/21 1850 01/17/21 2025 01/17/21 2323  BP: (!) 182/96 (!) 173/89 (!) 177/65 (!) 165/85  Pulse: 75 72 78 62  Resp: 18  16 16   Temp: 98.6 F (37 C)  100 F (37.8 C) 99.3 F (37.4 C)  TempSrc:   Axillary Axillary  SpO2:      Weight:      Height:        Intake/Output Summary (Last 24 hours) at 01/17/2021 2357 Last data filed at 01/17/2021 1312 Gross per 24 hour  Intake 1453.1 ml  Output 900 ml  Net 553.1 ml   Filed Weights   01/16/21 1051 01/16/21 1445  Weight: 100.1 kg  98.1 kg    Examination:   Constitutional: NAD, alert, not oriented HEENT: conjunctivae and lids normal, EOMI CV: No cyanosis.   RESP: normal respiratory effort, on RA Extremities: No effusions, edema in BLE SKIN: warm, dry.  Rash with peeling skin on the face  Neuro: II - XII grossly intact.     Data Reviewed: I have personally reviewed following labs and imaging studies  CBC: Recent Labs  Lab 01/16/21 1105 01/17/21 0654  WBC 6.4 6.5  NEUTROABS 4.7 4.8  HGB 12.7* 12.4*  HCT 40.1 38.1*  MCV 99.8 97.7  PLT 147* 150   Basic Metabolic Panel: Recent Labs  Lab  01/16/21 1105 01/17/21 0654  NA 140 138  K 3.9 4.2  CL 103 102  CO2 29 24  GLUCOSE 121* 101*  BUN 12 11  CREATININE 0.95 0.89  CALCIUM 9.9 9.3  MG  --  1.6*  PHOS  --  2.8   GFR: Estimated Creatinine Clearance: 79 mL/min (by C-G formula based on SCr of 0.89 mg/dL). Liver Function Tests: Recent Labs  Lab 01/16/21 1105 01/17/21 0654  AST 12* 12*  ALT 9 10  ALKPHOS 76 71  BILITOT 1.1 1.8*  PROT 7.8 7.3  ALBUMIN 4.1 3.7   No results for input(s): LIPASE, AMYLASE in the last 168 hours. Recent Labs  Lab 01/16/21 1106  AMMONIA 27   Coagulation Profile: Recent Labs  Lab 01/16/21 1105  INR 2.4*   Cardiac Enzymes: No results for input(s): CKTOTAL, CKMB, CKMBINDEX, TROPONINI in the last 168 hours. BNP (last 3 results) No results for input(s): PROBNP in the last 8760 hours. HbA1C: No results for input(s): HGBA1C in the last 72 hours. CBG: Recent Labs  Lab 01/16/21 1025 01/16/21 2027 01/16/21 2346 01/17/21 0434 01/17/21 0755  GLUCAP 114* 96 95 89 93   Lipid Profile: Recent Labs    01/17/21 0654  CHOL 84  HDL 26*  LDLCALC 41  TRIG 85  CHOLHDL 3.2   Thyroid Function Tests: Recent Labs    01/16/21 1105  TSH 1.117   Anemia Panel: Recent Labs    01/17/21 1352  VITAMINB12 465  FOLATE 11.8   Sepsis Labs: Recent Labs  Lab 01/16/21 1110 01/16/21 1515  PROCALCITON  --  <0.10  LATICACIDVEN 1.3  --     Recent Results (from the past 240 hour(s))  Urine Culture     Status: None   Collection Time: 01/16/21 11:05 AM   Specimen: Urine, Random  Result Value Ref Range Status   Specimen Description   Final    URINE, RANDOM Performed at [facility], [address] [place], [place] [address]    Special Requests   Final    NONE Performed at [facility], [address] [place], [place] [address]    Culture   Final    NO GROWTH Performed at [facility], [address] [place], [place] [address]    Report Status  01/17/2021 FINAL  Final  Resp Panel by RT-PCR (Flu A&B, Covid) Nasopharyngeal Swab     Status: None   Collection Time: 01/16/21 11:10 AM   Specimen: Nasopharyngeal Swab; Nasopharyngeal(NP) swabs in vial transport medium  Result Value Ref Range Status   SARS Coronavirus 2 by RT PCR NEGATIVE NEGATIVE Final    Comment: (NOTE) SARS-CoV-2 target nucleic acids are NOT DETECTED.  The SARS-CoV-2 RNA is generally detectable in upper respiratory specimens during the acute phase of infection. The lowest concentration of SARS-CoV-2 viral  copies this assay can detect is 138 copies/mL. A negative result does not preclude SARS-Cov-2 infection and should not be used as the sole basis for treatment or other patient management decisions. A negative result may occur with  improper specimen collection/handling, submission of specimen other than nasopharyngeal swab, presence of viral mutation(s) within the areas targeted by this assay, and inadequate number of viral copies(<138 copies/mL). A negative result must be combined with clinical observations, patient history, and epidemiological information. The expected result is Negative.  Fact Sheet for Patients:  BloggerCourse.com  Fact Sheet for Healthcare Providers:  SeriousBroker.it  This test is no t yet approved or cleared by the [place] FDA and  has been authorized for detection and/or diagnosis of SARS-CoV-2 by FDA under an Emergency Use Authorization (EUA). This EUA will remain  in effect (meaning this test can be used) for the duration of the COVID-19 declaration under Section 564(b)(1) of the Act, 21 U.S.C.section 360bbb-3(b)(1), unless the authorization is terminated  or revoked sooner.       Influenza A by PCR NEGATIVE NEGATIVE Final   Influenza B by PCR NEGATIVE NEGATIVE Final    Comment: (NOTE) The Xpert Xpress SARS-CoV-2/FLU/RSV plus assay is intended as an aid in the diagnosis of  influenza from Nasopharyngeal swab specimens and should not be used as a sole basis for treatment. Nasal washings and aspirates are unacceptable for Xpert Xpress SARS-CoV-2/FLU/RSV testing.  Fact Sheet for Patients: BloggerCourse.com  Fact Sheet for Healthcare Providers: SeriousBroker.it  This test is not yet approved or cleared by the [place] FDA and has been authorized for detection and/or diagnosis of SARS-CoV-2 by FDA under an Emergency Use Authorization (EUA). This EUA will remain in effect (meaning this test can be used) for the duration of the COVID-19 declaration under Section 564(b)(1) of the Act, 21 U.S.C. section 360bbb-3(b)(1), unless the authorization is terminated or revoked.  Performed at [facility], [address] [place], [place] [address]       Radiology Studies: MR BRAIN WO CONTRAST  Result Date: 01/16/2021 CLINICAL DATA:  Neuro deficit, acute, stroke suspected. EXAM: MRI HEAD WITHOUT CONTRAST TECHNIQUE: Multiplanar, multiecho pulse sequences of the brain and surrounding structures were obtained without intravenous contrast. COMPARISON:  Prior head CT examinations 01/16/2021 and earlier. FINDINGS: Brain: Examination is intermittently motion degraded. Most notably, there is moderate motion degradation of the coronal diffusion-weighted sequence, moderate motion degradation of the axial T2 weighted sequence, moderate motion degradation of the axial T1 weighted sequence. Moderate to moderately advanced cerebral atrophy. Comparatively mild cerebellar atrophy. Commensurate prominence of the ventricles and sulci. There is a punctate acute cortical infarct within the anterior left frontal lobe (series 5, image 39). Small chronic cortically based infarcts within the bilateral frontal, parietal and occipital lobes. Chronic infarct within the right corona radiata/basal ganglia. Chronic lacunar infarct within the  left thalamus. Background moderate multifocal T2/FLAIR hyperintensity within the cerebral white matter is nonspecific, but compatible with chronic small vessel ischemic disease. Small chronic infarcts within the bilateral cerebellar hemispheres No evidence of intracranial mass. No chronic intracranial blood products. No extra-axial fluid collection. No midline shift. Vascular: Expected proximal arterial flow voids. Skull and upper cervical spine: No focal marrow lesion. Ligamentous hypertrophy/pannus formation posterior to the dens. Sinuses/Orbits: Visualized orbits show no acute finding. Mild right ethmoid sinus mucosal thickening. IMPRESSION: Motion degraded examination, as described. Punctate acute cortical infarct within the anterior left frontal lobe. Small chronic cortically based infarcts within the bilateral frontal, parietal and occipital lobes.  Additional chronic infarcts within the right corona radiata/basal ganglia, left thalamus and left cerebellar hemisphere. Background moderate cerebral white matter chronic small vessel ischemic disease. Moderate to moderately advanced cerebral atrophy with comparatively mild cerebellar atrophy. Electronically Signed   By: Jackey Loge DO   On: 01/16/2021 14:21   DG Chest Portable 1 View  Result Date: 01/16/2021 CLINICAL DATA:  Altered mental status. EXAM: PORTABLE CHEST 1 VIEW COMPARISON:  None. No prior films are available on PACs for comparison. FINDINGS: Status post prior median sternotomy. The heart size is mildly enlarged. The mediastinal contour is otherwise unremarkable. Mild increased central pulmonary vessel caliber is identified. There is no focal pneumonia or pleural effusion. Spinal rods are noted. Degenerative joint changes of bilateral shoulders are identified. IMPRESSION: Mild increased central pulmonary vessel caliber. This can be seen in mild vascular congestion. Electronically Signed   By: Sherian Rein M.D.   On: 01/16/2021 11:44   CT HEAD  CODE STROKE WO CONTRAST  Addendum Date: 01/16/2021   ADDENDUM REPORT: 01/16/2021 10:51 ADDENDUM: Study discussed by telephone with Dr. Antoine Primas on 01/16/2021 at 1047 hours. Electronically Signed   By: Odessa Fleming M.D.   On: 01/16/2021 10:51   Result Date: 01/16/2021 CLINICAL DATA:  Code stroke. 80 year old male last known normal 0900 hours. Sudden onset altered mental status. EXAM: CT HEAD WITHOUT CONTRAST TECHNIQUE: Contiguous axial images were obtained from the base of the skull through the vertex without intravenous contrast. COMPARISON:  Head CT 11/21/2020 FINDINGS: Brain: Stable cerebral volume. No midline shift, mass effect, or evidence of intracranial mass lesion. Stable ventricle size and configuration. No acute intracranial hemorrhage identified. Chronic encephalomalacia in the right basal ganglia and along the left parieto-occipital sulcus compatible with prior ischemia. Likewise small areas of stable chronic cortical at the superior right parietal and superior right occipital lobes, also the anterior right frontal lobe series 3, image 23. Stable possible small chronic lacunar infarct of the left thalamus. Elsewhere stable patchy white matter hypodensity. No cortically based acute infarct identified. Vascular: Extensive Calcified atherosclerosis at the skull base. No suspicious intracranial vascular hyperdensity. Skull: No acute osseous abnormality identified. Sinuses/Orbits: Visualized paranasal sinuses and mastoids are stable and well aerated. Other: No acute orbit or scalp soft tissue finding. ASPECTS [facility] Stroke Program Early CT Score) Total score (0-10 with 10 being normal): 10 (chronic encephalomalacia). IMPRESSION: 1. Stable CT appearance of bilateral chronic ischemic disease. 2. No acute cortically based infarct or acute intracranial hemorrhage identified. ASPECTS 10. Electronically Signed: By: Odessa Fleming M.D. On: 01/16/2021 10:42     Scheduled Meds: . apixaban  5 mg Oral BID  .  atorvastatin  80 mg Oral QHS  . feeding supplement  237 mL Oral BID BM  . metoprolol tartrate  25 mg Oral BID  . multivitamin with minerals  1 tablet Oral Daily  . QUEtiapine  50 mg Oral QHS  . rivastigmine  1.5 mg Oral BID   Continuous Infusions: . levETIRAcetam Stopped (01/17/21 2256)     LOS: 0 days     Darlin Priestly, MD [facility] If 7PM-7AM, please contact night-coverage 01/17/2021, 11:57 PM

## 2021-01-18 ENCOUNTER — Other Ambulatory Visit: Payer: Self-pay

## 2021-01-18 DIAGNOSIS — G3183 Dementia with Lewy bodies: Secondary | ICD-10-CM

## 2021-01-18 LAB — BASIC METABOLIC PANEL
Anion gap: 10 (ref 5–15)
BUN: 10 mg/dL (ref 8–23)
CO2: 25 mmol/L (ref 22–32)
Calcium: 9.4 mg/dL (ref 8.9–10.3)
Chloride: 103 mmol/L (ref 98–111)
Creatinine, Ser: 0.69 mg/dL (ref 0.61–1.24)
GFR, Estimated: >60 mL/min (ref >60)
Glucose, Bld: 134 mg/dL — ABNORMAL HIGH (ref 70–99)
Potassium: 3.4 mmol/L — ABNORMAL LOW (ref 3.5–5.1)
Sodium: 138 mmol/L (ref 135–145)

## 2021-01-18 LAB — GLUCOSE, CAPILLARY: Glucose-Capillary: 150 mg/dL — ABNORMAL HIGH (ref 70–99)

## 2021-01-18 LAB — CBC
HCT: 37.1 % — ABNORMAL LOW (ref 39.0–52.0)
Hemoglobin: 12.3 g/dL — ABNORMAL LOW (ref 13.0–17.0)
MCH: 31.9 pg (ref 26.0–34.0)
MCHC: 33.2 g/dL (ref 30.0–36.0)
MCV: 96.4 fL (ref 80.0–100.0)
Platelets: 144 10*3/uL — ABNORMAL LOW (ref 150–400)
RBC: 3.85 MIL/uL — ABNORMAL LOW (ref 4.22–5.81)
RDW: 12.6 % (ref 11.5–15.5)
WBC: 6.2 10*3/uL (ref 4.0–10.5)
nRBC: 0 % (ref 0.0–0.2)

## 2021-01-18 LAB — HEMOGLOBIN A1C
Hgb A1c MFr Bld: 6.3 % — ABNORMAL HIGH (ref 4.8–5.6)
Mean Plasma Glucose: 134 mg/dL

## 2021-01-18 LAB — MAGNESIUM: Magnesium: 1.9 mg/dL (ref 1.7–2.4)

## 2021-01-18 MED ORDER — QUETIAPINE FUMARATE 25 MG PO TABS
25.0000 mg | ORAL_TABLET | Freq: Two times a day (BID) | ORAL | Status: DC | PRN
  Administered 2021-01-18 – 2021-01-22 (×5): 25 mg via ORAL
  Filled 2021-01-18 (×5): qty 1

## 2021-01-18 MED ORDER — TRIAMCINOLONE 0.1 % CREAM:EUCERIN CREAM 1:1: TOPICAL_CREAM | Freq: Two times a day (BID) | CUTANEOUS | Status: DC

## 2021-01-18 MED ORDER — POTASSIUM CHLORIDE 20 MEQ PO PACK
40.0000 meq | PACK | Freq: Once | ORAL | Status: AC
  Administered 2021-01-18: 11:00:00 40 meq via ORAL
  Filled 2021-01-18: qty 2

## 2021-01-18 MED ORDER — TRIAMCINOLONE ACETONIDE 0.1 % EX CREA
TOPICAL_CREAM | Freq: Two times a day (BID) | CUTANEOUS | Status: DC
  Filled 2021-01-18: qty 15

## 2021-01-18 MED ORDER — RAMIPRIL 10 MG PO CAPS
10.0000 mg | ORAL_CAPSULE | Freq: Two times a day (BID) | ORAL | Status: DC
  Administered 2021-01-18 – 2021-01-22 (×9): 10 mg via ORAL
  Filled 2021-01-18 (×11): qty 1

## 2021-01-18 MED ORDER — QUETIAPINE FUMARATE 25 MG PO TABS: 25.0000 mg | ORAL_TABLET | Freq: Every day | ORAL | Status: DC

## 2021-01-18 MED ORDER — LEVETIRACETAM 500 MG PO TABS
1000.0000 mg | ORAL_TABLET | Freq: Two times a day (BID) | ORAL | Status: DC
  Administered 2021-01-18 – 2021-01-22 (×8): 1000 mg via ORAL
  Filled 2021-01-18 (×10): qty 2

## 2021-01-18 MED ORDER — HALOPERIDOL 2 MG PO TABS
2.0000 mg | ORAL_TABLET | Freq: Three times a day (TID) | ORAL | Status: DC | PRN
  Filled 2021-01-18: qty 1

## 2021-01-18 MED ORDER — HYDROCERIN EX CREA
TOPICAL_CREAM | Freq: Two times a day (BID) | CUTANEOUS | Status: DC
  Filled 2021-01-18: qty 113

## 2021-01-18 NOTE — Plan of Care (Signed)
  Problem: Education: Goal: Knowledge of General Education information will improve Description: Including pain rating scale, medication(s)/side effects and non-pharmacologic comfort measures Outcome: Progressing   Problem: Clinical Measurements: Goal: Ability to maintain clinical measurements within normal limits will improve Outcome: Progressing   Problem: Safety: Goal: Ability to remain free from injury will improve Outcome: Progressing   Problem: Skin Integrity: Goal: Risk for impaired skin integrity will decrease Outcome: Progressing   Problem: Education: Goal: Knowledge of disease or condition will improve Outcome: Progressing Goal: Knowledge of secondary prevention will improve Outcome: Progressing Goal: Knowledge of patient specific risk factors addressed and post discharge goals established will improve Outcome: Progressing   Problem: Nutrition: Goal: Risk of aspiration will decrease Outcome: Progressing   Problem: Ischemic Stroke/TIA Tissue Perfusion: Goal: Complications of ischemic stroke/TIA will be minimized Outcome: Progressing

## 2021-01-18 NOTE — Progress Notes (Signed)
SLP Cancellation Note  Patient Details Name: Ian King MRN: [id number] DOB: 1941-04-29   Cancelled treatment:       Reason Eval/Treat Not Completed:  (chart reviewed; consulted Wife, pt, and NSG). Met w/ pt/Wife in pt's room -- he had finished his Lunch meal already. Discussed w/ Wife pt's swallowing and any concerns. Wife denied any swallowing concerns at this time but acknowledged the impact of Dementia on his behaviors overall. Wife and NA Sitter denied any overt clinical s/s of aspiration during this recent Lunch meal; discussed general aspiration precautions and gave recommendations for Pills Crushed in Puree for safer, easier swallowing from this point forward. Discussed menu options w/ Wife as well. Wife agreed. Noted pt having min increased agitation/behaviors; NSG to given PRN med to calm as pt takes at home per Wife. ST services will monitor pt's status while admitted; f/u w/ Wife and pt for any further needs. NSG updated re: Pills Crushing.       Orinda Kenner, MS, CCC-SLP Speech Language Pathologist Rehab Services [phone] [facility] 01/18/2021, 5:16 PM

## 2021-01-18 NOTE — TOC Progression Note (Signed)
Transition of Care [facility]) - Progression Note    Patient Details  Name: Ian King MRN: [id number] Date of Birth: 1941-08-27  [facility] CM/SW Contact  Allayne Butcher, RN Phone Number: 01/18/2021, 2:36 PM  Clinical Narrative:    Family prefers [facility] for short term rehab.  RNCM has reached out to Tammy at [facility] to see if they can offer a bed.  Patient will need to be sitter free for 24 hours before going.  Sitter discontinued today.    Expected Discharge Plan: Skilled Nursing Facility Barriers to Discharge: Continued Medical Work up  Expected Discharge Plan and Services Expected Discharge Plan: Skilled Nursing Facility       Living arrangements for the past 2 months: Single Family Home                                       Social Determinants of Health (SDOH) Interventions    Readmission Risk Interventions No flowsheet data found.

## 2021-01-18 NOTE — Progress Notes (Signed)
PROGRESS NOTE    Ian King  ZOX:[id number] DOB: 04/18/1941 DOA: 01/16/2021 PCP: Marguarite ArbourSparks, Jeffrey D, MD  105A/105A-BB   Assessment & Plan:   Active Problems:   AMS (altered mental status)   Stroke (cerebrum) (HCC)    Ian King is a 80 y.o. male with medical history significant for dementia, permanent A. fib on Xarelto, Parkinson's disease, chronic diastolic CHF, seizure disorder on AEDs, who presented to [facility] ED via EMS from home due to sudden onset altered mental status in the morning.  Pt was noted to be more confused and moaning while she was giving him a shower.  While in the ER a code stroke was called.  His altered mental status gradually improved.     AMS --unlikely to be due to the small punctate strokes.  Also not likely to be from seizure since last episode was 40+ years ago.  AMS likely part of the disease process of his underlying Lewy Body Dementia. --Mental status back to baseline the next day. 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 Code stroke was called on arrival to the ED today. --pt was already on Xarelto PTA, switched to Eliquis Plan: --cont Eliquis --cont statin --PT/OT rec SNF rehab  Lewy body Dementia with behavioral disturbance Hospital delirium --pt normally already gets agitated at home and wanders out at night, and need 24/7 supervision, per family. --Patient is in the process of being transitioned from keppra 1g bid to Lamotrigine for behavioral issues including aggression. Family noted a facial rash during this transition. Per neuro, Lamictal will not be restarted inpatient with plan to transition to a different anti seizure medicine outpatient. Plan: --cont Keppra for now, per neuro --consider Pimavanserin (Nuplazid) outpatient, per neuro --continue seroquel 50 mg nightly --seroquel 25 mg PRN for  agitation --avoid Haldol, per daughter --order hospital sitter for today, per family request, until family can arrange sitter themselves.    Permanent A. fib on Xarelto Currently rate controlled  --cont home Lopressor 25 mg BID --since pt was still having what appeared to be embolic strokes, Xarelto was switched to Eliquis during this hospitalization. --cont Eliquis  Hx of Seizure  --last episode >40 years ago.   --cont keppra for now --No need for inpatient EEG, per neuro  Hyperlipidemia --cont statin  Chronic diastolic CHF --stable --hold home lasix for now  HTN --Allowed permissive HTN for the first 2 days.  BP has been trending up --resume home Ramipril today --cont home Lopressor  Seborrheic dermatitis --skin rash over face looks like Seborrheic dermatitis --start Triamcinolone cream BID   DVT prophylaxis: WJ:XBJYNWGOn:Eliquis Code Status: DNR  Family Communication: wife updated at bedside, daughter updated on the phone.   Level of care: Med-Surg Dispo:   The patient is from: home Anticipated d/c is to: SNF Anticipated d/c date is: whenever bed available Patient currently is medically ready to d/c.   Subjective and Interval History:  Pt received seroquel 50 mg last evening, and per nursing, went to sleep and stayed asleep for most of the night.  Had some agitation this morning, but was calm by around noon.  Sitter was d/c'ed in the morning due to pt's eventual discharge to SNF, however, family requested hospital sitter until they can arrange their own, due to concern of pt falling.   Objective: Vitals:   01/18/21 0950 01/18/21 1054 01/18/21  1057 01/18/21 1311  BP: (!) 182/100 (!) 152/116 135/71 (!) 169/86  Pulse: 75 71 (!) 49 72  Resp: 19   15  Temp: 99.5 F (37.5 C)   98.6 F (37 C)  TempSrc: Axillary   Oral  SpO2: 96% 97%  98%  Weight:      Height:        Intake/Output Summary (Last 24 hours) at 01/18/2021 1511 Last data filed at 01/18/2021 1403 Gross  per 24 hour  Intake 340 ml  Output 1460 ml  Net -1120 ml   Filed Weights   01/16/21 1051 01/16/21 1445  Weight: 100.1 kg 98.1 kg    Examination:   Constitutional: NAD, alert, not oriented, sitting in recliner playing cards with his wife HEENT: conjunctivae and lids normal, EOMI CV: No cyanosis.   RESP: normal respiratory effort, on RA Extremities: No effusions, edema in BLE SKIN: warm, dry, patchy rash over his face Neuro: II - XII grossly intact.     Data Reviewed: I have personally reviewed following labs and imaging studies  CBC: Recent Labs  Lab 01/16/21 1105 01/17/21 0654 01/18/21 0431  WBC 6.4 6.5 6.2  NEUTROABS 4.7 4.8  --   HGB 12.7* 12.4* 12.3*  HCT 40.1 38.1* 37.1*  MCV 99.8 97.7 96.4  PLT 147* 150 144*   Basic Metabolic Panel: Recent Labs  Lab 01/16/21 1105 01/17/21 0654 01/18/21 0431  NA 140 138 138  K 3.9 4.2 3.4*  CL 103 102 103  CO2 29 24 25   GLUCOSE 121* 101* 134*  BUN 12 11 10   CREATININE 0.95 0.89 0.69  CALCIUM 9.9 9.3 9.4  MG  --  1.6* 1.9  PHOS  --  2.8  --    GFR: Estimated Creatinine Clearance: 87.9 mL/min (by C-G formula based on SCr of 0.69 mg/dL). Liver Function Tests: Recent Labs  Lab 01/16/21 1105 01/17/21 0654  AST 12* 12*  ALT 9 10  ALKPHOS 76 71  BILITOT 1.1 1.8*  PROT 7.8 7.3  ALBUMIN 4.1 3.7   No results for input(s): LIPASE, AMYLASE in the last 168 hours. Recent Labs  Lab 01/16/21 1106  AMMONIA 27   Coagulation Profile: Recent Labs  Lab 01/16/21 1105  INR 2.4*   Cardiac Enzymes: No results for input(s): CKTOTAL, CKMB, CKMBINDEX, TROPONINI in the last 168 hours. BNP (last 3 results) No results for input(s): PROBNP in the last 8760 hours. HbA1C: Recent Labs    01/17/21 0654  HGBA1C 6.3*   CBG: Recent Labs  Lab 01/16/21 1025 01/16/21 2027 01/16/21 2346 01/17/21 0434 01/17/21 0755  GLUCAP 114* 96 95 89 93   Lipid Profile: Recent Labs    01/17/21 0654  CHOL 84  HDL 26*  LDLCALC 41   TRIG 85  CHOLHDL 3.2   Thyroid Function Tests: Recent Labs    01/16/21 1105  TSH 1.117   Anemia Panel: Recent Labs    01/17/21 1352  VITAMINB12 465  FOLATE 11.8   Sepsis Labs: Recent Labs  Lab 01/16/21 1110 01/16/21 1515  PROCALCITON  --  <0.10  LATICACIDVEN 1.3  --     Recent Results (from the past 240 hour(s))  Urine Culture     Status: None   Collection Time: 01/16/21 11:05 AM   Specimen: Urine, Random  Result Value Ref Range Status   Specimen Description   Final    URINE, RANDOM Performed at [facility], [address], [place], [address] [place]    Special Requests  Final    NONE Performed at [facility], [address] [place], [place] [address]    Culture   Final    NO GROWTH Performed at [facility], [address], [place], [place] [address]    Report Status 01/17/2021 FINAL  Final  Resp Panel by RT-PCR (Flu A&B, Covid) Nasopharyngeal Swab     Status: None   Collection Time: 01/16/21 11:10 AM   Specimen: Nasopharyngeal Swab; Nasopharyngeal(NP) swabs in vial transport medium  Result Value Ref Range Status   SARS Coronavirus 2 by RT PCR NEGATIVE NEGATIVE Final    Comment: (NOTE) SARS-CoV-2 target nucleic acids are NOT DETECTED.  The SARS-CoV-2 RNA is generally detectable in upper respiratory specimens during the acute phase of infection. The lowest concentration of SARS-CoV-2 viral copies this assay can detect is 138 copies/mL. A negative result does not preclude SARS-Cov-2 infection and should not be used as the sole basis for treatment or other patient management decisions. A negative result may occur with  improper specimen collection/handling, submission of specimen other than nasopharyngeal swab, presence of viral mutation(s) within the areas targeted by this assay, and inadequate number of viral copies(<138 copies/mL). A negative result must be combined with clinical observations, patient history, and  epidemiological information. The expected result is Negative.  Fact Sheet for Patients:  BloggerCourse.com  Fact Sheet for Healthcare Providers:  SeriousBroker.it  This test is no t yet approved or cleared by the [place] FDA and  has been authorized for detection and/or diagnosis of SARS-CoV-2 by FDA under an Emergency Use Authorization (EUA). This EUA will remain  in effect (meaning this test can be used) for the duration of the COVID-19 declaration under Section 564(b)(1) of the Act, 21 U.S.C.section 360bbb-3(b)(1), unless the authorization is terminated  or revoked sooner.       Influenza A by PCR NEGATIVE NEGATIVE Final   Influenza B by PCR NEGATIVE NEGATIVE Final    Comment: (NOTE) The Xpert Xpress SARS-CoV-2/FLU/RSV plus assay is intended as an aid in the diagnosis of influenza from Nasopharyngeal swab specimens and should not be used as a sole basis for treatment. Nasal washings and aspirates are unacceptable for Xpert Xpress SARS-CoV-2/FLU/RSV testing.  Fact Sheet for Patients: BloggerCourse.com  Fact Sheet for Healthcare Providers: SeriousBroker.it  This test is not yet approved or cleared by the [place] FDA and has been authorized for detection and/or diagnosis of SARS-CoV-2 by FDA under an Emergency Use Authorization (EUA). This EUA will remain in effect (meaning this test can be used) for the duration of the COVID-19 declaration under Section 564(b)(1) of the Act, 21 U.S.C. section 360bbb-3(b)(1), unless the authorization is terminated or revoked.  Performed at [facility], [address] [place], [place] [address]       Radiology Studies: No results found.   Scheduled Meds: . apixaban  5 mg Oral BID  . atorvastatin  80 mg Oral QHS  . feeding supplement  237 mL Oral BID BM  . hydrocerin   Topical BID  . levETIRAcetam  1,000 mg  Oral BID  . metoprolol tartrate  25 mg Oral BID  . multivitamin with minerals  1 tablet Oral Daily  . QUEtiapine  25 mg Oral Daily  . QUEtiapine  50 mg Oral QHS  . ramipril  10 mg Oral BID  . rivastigmine  1.5 mg Oral BID  . triamcinolone cream   Topical BID   Continuous Infusions:    LOS: 1 day  Darlin Priestly, MD [facility] If 7PM-7AM, please contact night-coverage 01/18/2021, 3:11 PM

## 2021-01-18 NOTE — Plan of Care (Signed)
  Problem: Education: Goal: Knowledge of General Education information will improve Description: Including pain rating scale, medication(s)/side effects and non-pharmacologic comfort measures Outcome: Progressing   Problem: Clinical Measurements: Goal: Ability to maintain clinical measurements within normal limits will improve Outcome: Progressing   Problem: Safety: Goal: Ability to remain free from injury will improve Outcome: Progressing   Problem: Skin Integrity: Goal: Risk for impaired skin integrity will decrease Outcome: Progressing   Problem: Education: Goal: Knowledge of disease or condition will improve Outcome: Progressing Goal: Knowledge of secondary prevention will improve Outcome: Progressing Goal: Knowledge of patient specific risk factors addressed and post discharge goals established will improve Outcome: Progressing   Problem: Nutrition: Goal: Risk of aspiration will decrease Outcome: Progressing   Problem: Ischemic Stroke/TIA Tissue Perfusion: Goal: Complications of ischemic stroke/TIA will be minimized Outcome: Progressing   

## 2021-01-19 DIAGNOSIS — I6339 Cerebral infarction due to thrombosis of other cerebral artery: Secondary | ICD-10-CM

## 2021-01-19 LAB — BASIC METABOLIC PANEL
Anion gap: 8 (ref 5–15)
BUN: 11 mg/dL (ref 8–23)
CO2: 25 mmol/L (ref 22–32)
Calcium: 9.5 mg/dL (ref 8.9–10.3)
Chloride: 106 mmol/L (ref 98–111)
Creatinine, Ser: 0.85 mg/dL (ref 0.61–1.24)
GFR, Estimated: >60 mL/min (ref >60)
Glucose, Bld: 131 mg/dL — ABNORMAL HIGH (ref 70–99)
Potassium: 3.8 mmol/L (ref 3.5–5.1)
Sodium: 139 mmol/L (ref 135–145)

## 2021-01-19 LAB — CBC
HCT: 37 % — ABNORMAL LOW (ref 39.0–52.0)
Hemoglobin: 12.7 g/dL — ABNORMAL LOW (ref 13.0–17.0)
MCH: 32.6 pg (ref 26.0–34.0)
MCHC: 34.3 g/dL (ref 30.0–36.0)
MCV: 95.1 fL (ref 80.0–100.0)
Platelets: 146 10*3/uL — ABNORMAL LOW (ref 150–400)
RBC: 3.89 MIL/uL — ABNORMAL LOW (ref 4.22–5.81)
RDW: 12.5 % (ref 11.5–15.5)
WBC: 6.5 10*3/uL (ref 4.0–10.5)
nRBC: 0 % (ref 0.0–0.2)

## 2021-01-19 LAB — GLUCOSE, CAPILLARY
Glucose-Capillary: 155 mg/dL — ABNORMAL HIGH (ref 70–99)
Glucose-Capillary: 169 mg/dL — ABNORMAL HIGH (ref 70–99)

## 2021-01-19 LAB — MAGNESIUM: Magnesium: 1.9 mg/dL (ref 1.7–2.4)

## 2021-01-19 NOTE — Progress Notes (Signed)
Physical Therapy Treatment Patient Details Name: Ian King MRN: [id number] DOB: 1941-04-06 Today's Date: 01/19/2021    History of Present Illness presented to ER secondary to acute AMS; admitted for management of acute metabolic encephalopathy related to acute cortical infarct to L anterior frontal lobe.    PT Comments    Patient requiring increased level of assist for all mobility tasks this date (up to +2 assist with standing, transfers and gait).  Increased R lateral lean (almost pushing at times) with sitting and standing postures; very limited to no spontaneous righting reactions noted.  Do recommend continued use of RW and +2 with all mobility at this time. Extensive discussion with wife regarding discharge needs. Given change in functional performance, continue to recommend STR for additional rehab until patient is safely manageable with +1 assist.  Do understand that cognitively, change in environment is difficult and understand desire to return to familiar environment/routine; however, reviewed current level of physical assist patient requiring (and change from initial evaluation) and level of support that would entail at home.  Wife present to observe full session; will incorporate hands-on assist with wife next session for full awareness of required level of assist.  If home continues to be preferred plan, recommend EMS transport home (has 6 steps to enter/exit home), manual WC, hoyer lift, BSC and 24/7 physical assist (ideally of +2 for mobility tasks).     Follow Up Recommendations  SNF     Equipment Recommendations  Rolling walker with 5" wheels;3in1 (PT)    Recommendations for Other Services       Precautions / Restrictions Precautions Precautions: Fall Restrictions Weight Bearing Restrictions: No    Mobility  Bed Mobility Overal bed mobility: Needs Assistance Bed Mobility: Supine to Sit     Supine to sit: Max assist     General bed mobility comments:  significant assist for LE management, truncal elevation; very poor flexibility within spine/trunk, limited ability to dissociate movement with bed mobility efforts.  Often requiring hand-over-hand assist to facilitate UE placement    Transfers Overall transfer level: Needs assistance Equipment used: Rolling walker (2 wheeled) Transfers: Sit to/from Stand Sit to Stand: Mod assist;+2 physical assistance         General transfer comment: requires UE placement on RW to optimally comprehend task, max assist for R LE placement (tends to adduct); assist to stabilize/block LEs with lift off. Heavy R lateral lean, absent righting reactions or awareness of altered midline  Ambulation/Gait Ambulation/Gait assistance: Mod assist;+2 physical assistance Gait Distance (Feet): 10 Feet Assistive device: Rolling walker (2 wheeled)       General Gait Details: very short, shuffling steps; heavy R lean with inconsistent R LE foot placement (tends to adduct).  Requires hand-over-hand guidance for RW management/advancement; poor/absent balance/righting reactions.  Unsafe to attempt without RW and +2   Stairs             Wheelchair Mobility    Modified Rankin (Stroke Patients Only)       Balance Overall balance assessment: Needs assistance Sitting-balance support: No upper extremity supported;Feet supported Sitting balance-Leahy Scale: Poor Sitting balance - Comments: absent attempts at self-correction Postural control: Posterior lean;Right lateral lean Standing balance support: Bilateral upper extremity supported Standing balance-Leahy Scale: Zero                              Cognition Arousal/Alertness: Awake/alert Behavior During Therapy: Flat affect  Exercises      General Comments        Pertinent Vitals/Pain Pain Assessment: No/denies pain    Home Living                      Prior Function             PT Goals (current goals can now be found in the care plan section) Acute Rehab PT Goals Patient Stated Goal: to get stronger PT Goal Formulation: With patient/family Time For Goal Achievement: 01/31/21 Potential to Achieve Goals: Fair Progress towards PT goals: Not progressing toward goals - comment (requiring increased physical assist thsi date)    Frequency    Min 2X/week      PT Plan Current plan remains appropriate    Co-evaluation              AM-PAC PT "6 Clicks" Mobility   Outcome Measure  Help needed turning from your back to your side while in a flat bed without using bedrails?: A Lot Help needed moving from lying on your back to sitting on the side of a flat bed without using bedrails?: A Lot Help needed moving to and from a bed to a chair (including a wheelchair)?: A Lot Help needed standing up from a chair using your arms (e.g., wheelchair or bedside chair)?: A Lot Help needed to walk in hospital room?: A Lot Help needed climbing 3-5 steps with a railing? : Total 6 Click Score: 11    End of Session Equipment Utilized During Treatment: Gait belt Activity Tolerance: Patient tolerated treatment well Patient left: in chair;with call bell/phone within reach;with family/visitor present;with nursing/sitter in room Nurse Communication: Mobility status PT Visit Diagnosis: Muscle weakness (generalized) (M62.81);Difficulty in walking, not elsewhere classified (R26.2)     Time: 3785-8850 PT Time Calculation (min) (ACUTE ONLY): 33 min  Charges:  $Gait Training: 8-22 mins $Therapeutic Activity: 8-22 mins                     Mackie Goon H. Manson Passey, PT, DPT, NCS 01/19/21, 2:38 PM [phone]

## 2021-01-19 NOTE — Progress Notes (Signed)
SLP Cancellation Note  Patient Details Name: Ian King MRN: [id number] DOB: 04-25-1941   Cancelled treatment:       Reason Eval/Treat Not Completed:  (chart reviewed; met w/ pt and Wife again in room). Per thorough discussion w/ Wife again today, no overt swallowing concerns are noted by Wife, pt. Wife described pt's full breakfast meal he ate w/out any coughing/choking -- he fed himself w/ her setup support. Discussed again the impact of Dementia on his behaviors and attention to tasks including swallowing. Discussed general aspiration precautions; gave Handouts. Continue to recommend Pills Crushed in Puree for safer, easier swallowing overall(pt has many pills per NSG).  ST services will sign off w/ NSG to reconsult if any new needs arise while admitted. Wife agreed. NSG updated.      Orinda Kenner, MS, CCC-SLP Speech Language Pathologist Rehab Services [phone] [facility] 01/19/2021, 11:17 AM

## 2021-01-19 NOTE — Plan of Care (Signed)
  Problem: Education: Goal: Knowledge of General Education information will improve Description: Including pain rating scale, medication(s)/side effects and non-pharmacologic comfort measures Outcome: Progressing   Problem: Clinical Measurements: Goal: Ability to maintain clinical measurements within normal limits will improve Outcome: Progressing   Problem: Safety: Goal: Ability to remain free from injury will improve Outcome: Progressing   Problem: Skin Integrity: Goal: Risk for impaired skin integrity will decrease Outcome: Progressing   Problem: Education: Goal: Knowledge of disease or condition will improve Outcome: Progressing Goal: Knowledge of secondary prevention will improve Outcome: Progressing Goal: Knowledge of patient specific risk factors addressed and post discharge goals established will improve Outcome: Progressing   Problem: Nutrition: Goal: Risk of aspiration will decrease Outcome: Progressing   Problem: Ischemic Stroke/TIA Tissue Perfusion: Goal: Complications of ischemic stroke/TIA will be minimized Outcome: Progressing   

## 2021-01-19 NOTE — Progress Notes (Signed)
Occupational Therapy Treatment Patient Details Name: Ian King MRN: [id number] DOB: 04-11-1941 Today's Date: 01/19/2021    History of present illness 80yo male presented to ER secondary to acute AMS; admitted for management of acute metabolic encephalopathy related to acute cortical infarct to L anterior frontal lobe.   OT comments  Pt seen for OT tx this date. Pt received in recliner with spouse present. Pt wanting to get out of the recliner. RN present to provide +2 assist given pt's fatigue and later in the day. Spouse reports that pt "sundowns." Pt required MOD A +2 with multimodal cues for initiation, sequencing, and termination of tasks. Difficulty initiating standing and taking small steps noted. When transferring to the L from recliner to EOB, pt required MIN-MOD A for pivoting with RW and taking a couple small side steps. MAX A for return to bed. Pt/spouse educated in dementia caregiving strategies for ADL participation and mobility, types of assist across the trajectory of the disease, and resources to support caregiving. Spouse very appreciative. Frequency updated while pt is hospitalized to support pt/spouse's goals. Continue to recommend SNF at this time for short term rehab. Given pt is far from baseline (was using a [facility] for mobility with spouse providing supervision, now requiring +2 assist and a RW).    Follow Up Recommendations  SNF    Equipment Recommendations  3 in 1 bedside commode;Tub/shower seat    Recommendations for Other Services      Precautions / Restrictions Precautions Precautions: Fall Restrictions Weight Bearing Restrictions: No       Mobility Bed Mobility Overal bed mobility: Needs Assistance Bed Mobility: Sit to Supine     Supine to sit: Max assist Sit to supine: Max assist   General bed mobility comments: significant assist for LE management, truncal elevation; very poor flexibility within spine/trunk, limited ability to dissociate movement  with bed mobility efforts.  Often requiring hand-over-hand assist to facilitate UE placement    Transfers Overall transfer level: Needs assistance Equipment used: Rolling walker (2 wheeled) Transfers: Sit to/from Stand Sit to Stand: Mod assist;+2 physical assistance         General transfer comment: Cues for hand placement, sequencing, blocked LEs prior to lift off    Balance Overall balance assessment: Needs assistance Sitting-balance support: No upper extremity supported;Feet supported Sitting balance-Leahy Scale: Poor Sitting balance - Comments: absent attempts at self-correction Postural control: Posterior lean;Right lateral lean Standing balance support: Bilateral upper extremity supported;During functional activity Standing balance-Leahy Scale: Poor Standing balance comment: requires MOD A x2 to support upright with BUE on RW                           ADL either performed or assessed with clinical judgement   ADL Overall ADL's : Needs assistance/impaired                                       General ADL Comments: Pt continues to require MIN A for UB ADL (able to feed himself), MAX A for seated LB ADL, cues for initiation, sequencing, and termination of tasks     Vision Patient Visual Report: No change from baseline     Perception     Praxis      Cognition Arousal/Alertness: Awake/alert Behavior During Therapy: Flat affect Overall Cognitive Status: Impaired/Different from baseline  General Comments: Pt requires simple, repetative multimodal cues to support initiation, sequencing, and termination of tasks        Exercises Other Exercises Other Exercises: Pt/spouse educated in dementia caregiving strategies for ADL participation and mobility, types of assist across the trajectory of the disease, and resources to support caregiving   Shoulder Instructions       General Comments       Pertinent Vitals/ Pain       Pain Assessment: Faces Faces Pain Scale: No hurt  Home Living                                          Prior Functioning/Environment              Frequency  Min 2X/week        Progress Toward Goals  OT Goals(current goals can now be found in the care plan section)  Progress towards OT goals: Progressing toward goals  Acute Rehab OT Goals Patient Stated Goal: to get stronger OT Goal Formulation: With family Time For Goal Achievement: 01/31/21 Potential to Achieve Goals: Good  Plan Discharge plan remains appropriate;Frequency needs to be updated    Co-evaluation                 AM-PAC OT "6 Clicks" Daily Activity     Outcome Measure   Help from another person eating meals?: A Little Help from another person taking care of personal grooming?: A Little Help from another person toileting, which includes using toliet, bedpan, or urinal?: A Lot Help from another person bathing (including washing, rinsing, drying)?: A Lot Help from another person to put on and taking off regular upper body clothing?: A Little Help from another person to put on and taking off regular lower body clothing?: A Lot 6 Click Score: 15    End of Session Equipment Utilized During Treatment: Gait belt  OT Visit Diagnosis: Unsteadiness on feet (R26.81);Muscle weakness (generalized) (M62.81);Other symptoms and signs involving cognitive function   Activity Tolerance Patient tolerated treatment well   Patient Left in bed;with call bell/phone within reach;with bed alarm set   Nurse Communication          Time: 6468-0321 OT Time Calculation (min): 47 min  Charges: OT General Charges $OT Visit: 1 Visit OT Treatments $Self Care/Home Management : 38-52 mins  Wynona Canes, MPH, MS, OTR/L ascom [phone] 01/19/21, 3:01 PM

## 2021-01-19 NOTE — TOC Progression Note (Signed)
Transition of Care [facility]) - Progression Note    Patient Details  Name: Ian King MRN: [id number] Date of Birth: 1941-07-07  Transition of Care [facility]) CM/SW Contact  Allayne Butcher, RN Phone Number: 01/19/2021, 4:18 PM  Clinical Narrative:    Patient's wife was considering earlier taking the patient home with home health services and hired aides to come into the home.  Patient worked with PT today and was not really able to walk.  Wife is thinking that he may still need SNF.  Problem with discharge is that patient is requiring a sitter.  Family may be able to pay for a sitter to come in at the facility.   TOC will follow up with wife and patient tomorrow.    Expected Discharge Plan: Skilled Nursing Facility Barriers to Discharge: Continued Medical Work up  Expected Discharge Plan and Services Expected Discharge Plan: Skilled Nursing Facility       Living arrangements for the past 2 months: Single Family Home                                       Social Determinants of Health (SDOH) Interventions    Readmission Risk Interventions No flowsheet data found.

## 2021-01-19 NOTE — Progress Notes (Signed)
PROGRESS NOTE    Ian King  OZH:[id number] DOB: 03-10-1941 DOA: 01/16/2021 PCP: Marguarite Arbour, MD  105A/105A-BB   Assessment & Plan:   Active Problems:   AMS (altered mental status)   Stroke (cerebrum) (HCC)    Ian King is a 80 y.o. male with medical history significant for dementia, permanent A. fib on Xarelto, Parkinson's disease, chronic diastolic CHF, seizure disorder on AEDs, who presented to [facility] ED via EMS from home due to sudden onset altered mental status in the morning.  Pt was noted to be more confused and moaning while she was giving him a shower.  While in the ER a code stroke was called.  His altered mental status gradually improved.   Wife wants to take him to home with maximum home health services, daughter wants him to go to SNF.  They will let us know with the potential discharge for tomorrow if decided to go to home with home health services.   AMS --unlikely to be due to the small punctate strokes.  Also not likely to be from seizure since last episode was 40+ years ago.  AMS likely part of the disease process of his underlying Lewy Body Dementia. --Mental status back to baseline the next day. 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 Code stroke was called on arrival to the ED today. --pt was already on Xarelto PTA, switched to Eliquis Plan: --cont Eliquis --cont statin --PT/OT rec SNF rehab  Lewy body Dementia with behavioral disturbance Hospital delirium --pt normally already gets agitated at home and wanders out at night, and need 24/7 supervision, per family. --Patient is in the process of being transitioned from keppra 1g bid to Lamotrigine for behavioral issues including aggression. Family noted a facial rash during this transition. Per neuro, Lamictal will not be restarted inpatient with plan to  transition to a different anti seizure medicine outpatient. Plan: --cont Keppra for now, per neuro --consider Pimavanserin (Nuplazid) outpatient, per neuro --continue seroquel 50 mg nightly --seroquel 25 mg PRN for agitation --avoid Haldol, per daughter --order hospital sitter for today, per family request, until family can arrange sitter themselves.    Permanent A. fib on Xarelto Currently rate controlled  --cont home Lopressor 25 mg BID --since pt was still having what appeared to be embolic strokes, Xarelto was switched to Eliquis during this hospitalization. --cont Eliquis  Hx of Seizure  --last episode >40 years ago.   --cont keppra for now --No need for inpatient EEG, per neuro  Hyperlipidemia --cont statin  Chronic diastolic CHF --stable --hold home lasix for now  HTN --Allowed permissive HTN for the first 2 days.  BP has been trending up --resume home Ramipril today --cont home Lopressor  Seborrheic dermatitis --skin rash over face looks like Seborrheic dermatitis --start Triamcinolone cream BID   DVT prophylaxis: GE:XBMWUXL Code Status: DNR  Family Communication: wife updated at bedside,    Level of care: Med-Surg Dispo:   The patient is from: home Anticipated d/c is to: SNF-might go home with home health services tomorrow. Anticipated d/c date is: whenever bed available Patient currently is medically ready to d/c.   Subjective and Interval History:  Patient was seen and examined today.  Wife at bedside.  No new complaint.  Wife is going to provide private sitter tonight as the family is very afraid of his  falls. Wife also wants to take him to the home with home health services.  She will let us know after talking with her daughter as she really wants to pursue SNF.  Objective: Vitals:   01/18/21 2007 01/18/21 2326 01/19/21 0459 01/19/21 0757  BP: (!) 157/79 131/74 118/72 (!) 158/85  Pulse: 70 67 74 86  Resp: 17 17 16 16   Temp: 98 F (36.7 C) 97.9  F (36.6 C) 98.6 F (37 C) 98.8 F (37.1 C)  TempSrc:      SpO2: 96% 97% 93% 95%  Weight:      Height:        Intake/Output Summary (Last 24 hours) at 01/19/2021 1354 Last data filed at 01/19/2021 0900 Gross per 24 hour  Intake 590 ml  Output 920 ml  Net -330 ml   Filed Weights   01/16/21 1051 01/16/21 1445  Weight: 100.1 kg 98.1 kg    Examination:   General.  Frail elderly man, in no acute distress. Pulmonary.  Lungs clear bilaterally, normal respiratory effort. CV.  Regular rate and rhythm, no JVD, rub or murmur. Abdomen.  Soft, nontender, nondistended, BS positive. CNS.  Alert.  No focal neurologic deficit. Extremities.  No edema, no cyanosis, pulses intact and symmetrical. Psychiatry.  Judgment and insight appears normal.   Data Reviewed: I have personally reviewed following labs and imaging studies  CBC: Recent Labs  Lab 01/16/21 1105 01/17/21 0654 01/18/21 0431 01/19/21 0538  WBC 6.4 6.5 6.2 6.5  NEUTROABS 4.7 4.8  --   --   HGB 12.7* 12.4* 12.3* 12.7*  HCT 40.1 38.1* 37.1* 37.0*  MCV 99.8 97.7 96.4 95.1  PLT 147* 150 144* 146*   Basic Metabolic Panel: Recent Labs  Lab 01/16/21 1105 01/17/21 0654 01/18/21 0431 01/19/21 0538  NA 140 138 138 139  K 3.9 4.2 3.4* 3.8  CL 103 102 103 106  CO2 29 24 25 25   GLUCOSE 121* 101* 134* 131*  BUN 12 11 10 11   CREATININE 0.95 0.89 0.69 0.85  CALCIUM 9.9 9.3 9.4 9.5  MG  --  1.6* 1.9 1.9  PHOS  --  2.8  --   --    GFR: Estimated Creatinine Clearance: 82.7 mL/min (by C-G formula based on SCr of 0.85 mg/dL). Liver Function Tests: Recent Labs  Lab 01/16/21 1105 01/17/21 0654  AST 12* 12*  ALT 9 10  ALKPHOS 76 71  BILITOT 1.1 1.8*  PROT 7.8 7.3  ALBUMIN 4.1 3.7   No results for input(s): LIPASE, AMYLASE in the last 168 hours. Recent Labs  Lab 01/16/21 1106  AMMONIA 27   Coagulation Profile: Recent Labs  Lab 01/16/21 1105  INR 2.4*   Cardiac Enzymes: No results for input(s): CKTOTAL, CKMB,  CKMBINDEX, TROPONINI in the last 168 hours. BNP (last 3 results) No results for input(s): PROBNP in the last 8760 hours. HbA1C: Recent Labs    01/17/21 0654  HGBA1C 6.3*   CBG: Recent Labs  Lab 01/17/21 0434 01/17/21 0755 01/18/21 2049 01/19/21 0759 01/19/21 1225  GLUCAP 89 93 150* 155* 169*   Lipid Profile: Recent Labs    01/17/21 0654  CHOL 84  HDL 26*  LDLCALC 41  TRIG 85  CHOLHDL 3.2   Thyroid Function Tests: No results for input(s): TSH, T4TOTAL, FREET4, T3FREE, THYROIDAB in the last 72 hours. Anemia Panel: Recent Labs    01/17/21 1352  VITAMINB12 465  FOLATE 11.8   Sepsis Labs: Recent Labs  Lab 01/16/21 1110 01/16/21  1515  PROCALCITON  --  <0.10  LATICACIDVEN 1.3  --     Recent Results (from the past 240 hour(s))  Urine Culture     Status: None   Collection Time: 01/16/21 11:05 AM   Specimen: Urine, Random  Result Value Ref Range Status   Specimen Description   Final    URINE, RANDOM Performed at [facility], [address] [place], [place] [address]    Special Requests   Final    NONE Performed at [facility], [address], [place], [place] [address]    Culture   Final    NO GROWTH Performed at [facility], [address], [place], [place] [address]    Report Status 01/17/2021 FINAL  Final  Resp Panel by RT-PCR (Flu A&B, Covid) Nasopharyngeal Swab     Status: None   Collection Time: 01/16/21 11:10 AM   Specimen: Nasopharyngeal Swab; Nasopharyngeal(NP) swabs in vial transport medium  Result Value Ref Range Status   SARS Coronavirus 2 by RT PCR NEGATIVE NEGATIVE Final    Comment: (NOTE) SARS-CoV-2 target nucleic acids are NOT DETECTED.  The SARS-CoV-2 RNA is generally detectable in upper respiratory specimens during the acute phase of infection. The lowest concentration of SARS-CoV-2 viral copies this assay can detect is 138 copies/mL. A negative result does not preclude SARS-Cov-2 infection and should  not be used as the sole basis for treatment or other patient management decisions. A negative result may occur with  improper specimen collection/handling, submission of specimen other than nasopharyngeal swab, presence of viral mutation(s) within the areas targeted by this assay, and inadequate number of viral copies(<138 copies/mL). A negative result must be combined with clinical observations, patient history, and epidemiological information. The expected result is Negative.  Fact Sheet for Patients:  BloggerCourse.com  Fact Sheet for Healthcare Providers:  SeriousBroker.it  This test is no t yet approved or cleared by the [place] FDA and  has been authorized for detection and/or diagnosis of SARS-CoV-2 by FDA under an Emergency Use Authorization (EUA). This EUA will remain  in effect (meaning this test can be used) for the duration of the COVID-19 declaration under Section 564(b)(1) of the Act, 21 U.S.C.section 360bbb-3(b)(1), unless the authorization is terminated  or revoked sooner.       Influenza A by PCR NEGATIVE NEGATIVE Final   Influenza B by PCR NEGATIVE NEGATIVE Final    Comment: (NOTE) The Xpert Xpress SARS-CoV-2/FLU/RSV plus assay is intended as an aid in the diagnosis of influenza from Nasopharyngeal swab specimens and should not be used as a sole basis for treatment. Nasal washings and aspirates are unacceptable for Xpert Xpress SARS-CoV-2/FLU/RSV testing.  Fact Sheet for Patients: BloggerCourse.com  Fact Sheet for Healthcare Providers: SeriousBroker.it  This test is not yet approved or cleared by the [place] FDA and has been authorized for detection and/or diagnosis of SARS-CoV-2 by FDA under an Emergency Use Authorization (EUA). This EUA will remain in effect (meaning this test can be used) for the duration of the COVID-19 declaration under  Section 564(b)(1) of the Act, 21 U.S.C. section 360bbb-3(b)(1), unless the authorization is terminated or revoked.  Performed at [facility], [address] [place], [place] [address]       Radiology Studies: No results found.   Scheduled Meds: . apixaban  5 mg Oral BID  . atorvastatin  80 mg Oral QHS  . feeding supplement  237 mL Oral BID BM  . hydrocerin  Topical BID  . levETIRAcetam  1,000 mg Oral BID  . metoprolol tartrate  25 mg Oral BID  . multivitamin with minerals  1 tablet Oral Daily  . QUEtiapine  50 mg Oral QHS  . ramipril  10 mg Oral BID  . rivastigmine  1.5 mg Oral BID  . triamcinolone cream   Topical BID   Continuous Infusions:    LOS: 2 days     Arnetha CourserSumayya Lakisa Lotz, MD [facility] If 7PM-7AM, please contact night-coverage 01/19/2021, 1:54 PM

## 2021-01-20 ENCOUNTER — Inpatient Hospital Stay: Payer: Medicare PPO

## 2021-01-20 DIAGNOSIS — M10062 Idiopathic gout, left knee: Secondary | ICD-10-CM

## 2021-01-20 LAB — CBC
HCT: 38.3 % — ABNORMAL LOW (ref 39.0–52.0)
Hemoglobin: 12.5 g/dL — ABNORMAL LOW (ref 13.0–17.0)
MCH: 31.6 pg (ref 26.0–34.0)
MCHC: 32.6 g/dL (ref 30.0–36.0)
MCV: 97 fL (ref 80.0–100.0)
Platelets: 159 10*3/uL (ref 150–400)
RBC: 3.95 MIL/uL — ABNORMAL LOW (ref 4.22–5.81)
RDW: 12.6 % (ref 11.5–15.5)
WBC: 6.9 10*3/uL (ref 4.0–10.5)
nRBC: 0 % (ref 0.0–0.2)

## 2021-01-20 LAB — BASIC METABOLIC PANEL
Anion gap: 8 (ref 5–15)
BUN: 14 mg/dL (ref 8–23)
CO2: 24 mmol/L (ref 22–32)
Calcium: 9.6 mg/dL (ref 8.9–10.3)
Chloride: 106 mmol/L (ref 98–111)
Creatinine, Ser: 0.88 mg/dL (ref 0.61–1.24)
GFR, Estimated: >60 mL/min (ref >60)
Glucose, Bld: 147 mg/dL — ABNORMAL HIGH (ref 70–99)
Potassium: 3.8 mmol/L (ref 3.5–5.1)
Sodium: 138 mmol/L (ref 135–145)

## 2021-01-20 LAB — SARS CORONAVIRUS 2 (TAT 6-24 HRS): SARS Coronavirus 2: NEGATIVE

## 2021-01-20 LAB — MAGNESIUM: Magnesium: 2 mg/dL (ref 1.7–2.4)

## 2021-01-20 MED ORDER — DICLOFENAC SODIUM 1 % EX GEL
2.0000 g | Freq: Four times a day (QID) | CUTANEOUS | Status: DC
  Administered 2021-01-20 – 2021-01-22 (×8): 2 g via TOPICAL
  Filled 2021-01-20: qty 100

## 2021-01-20 MED ORDER — TRAMADOL HCL 50 MG PO TABS: 50.0000 mg | ORAL_TABLET | Freq: Four times a day (QID) | ORAL | Status: DC | PRN

## 2021-01-20 MED ORDER — ACETAMINOPHEN 325 MG PO TABS
650.0000 mg | ORAL_TABLET | Freq: Four times a day (QID) | ORAL | Status: DC | PRN
  Administered 2021-01-20: 650 mg via ORAL
  Filled 2021-01-20: qty 2

## 2021-01-20 NOTE — Progress Notes (Signed)
Occupational Therapy Treatment Patient Details Name: Ian King MRN: [id number] DOB: [birth year] Today's Date: 01/20/2021    History of present illness 80yo male presented to ER secondary to acute AMS; admitted for management of acute metabolic encephalopathy related to acute cortical infarct to L anterior frontal lobe.   OT comments  Upon entering the room, pt supine in bed with NT present starting to give pt bed bath. OT offered to assist pt with ADLs for therapeutic intervention this session. Pt's wife present this session for education and observation. Pt required max A for supine >sit to EOB. Pt sitting with min A for balance with mod multimodal cuing for anterior weight shifting to maintain balance. Pt needing increased cuing to utilize R UE for functional task/forced use. Pt needing mac cuing for sequencing and initiation of tasks. Pt standing with heavy max A on EOB but unable to stand upright. Pt making second attempt and standing with wife acting as +2 assist while therapist washed pt's buttocks. Pt then needing manual facilitation for weight shift to the L in order to take small shuffling side steps. Pt needing constant cuing for upright position and noted to have posterior bias in standing. Pt was able to sit EOB for 25 minutes this session during self care tasks. OT recommended if pt returning home to perform LB self care tasks from bed level for safety and to have hospital bed. OT continues to recommend SNF to optimize functional performance and for safety.    Follow Up Recommendations  SNF    Equipment Recommendations  3 in 1 bedside commode;Tub/shower seat       Precautions / Restrictions Precautions Precautions: Fall       Mobility Bed Mobility Overal bed mobility: Needs Assistance Bed Mobility: Sit to Supine     Supine to sit: Total assist Sit to supine: Total assist        Transfers Overall transfer level: Needs assistance Equipment used: Rolling walker (2  wheeled) Transfers: Sit to/from Stand Sit to Stand: Max assist;Mod assist;+2 physical assistance         General transfer comment: cuing for hand placement and sequencing with posterior bias noted this session.    Balance Overall balance assessment: Needs assistance Sitting-balance support: No upper extremity supported;Feet supported Sitting balance-Leahy Scale: Fair Sitting balance - Comments: min A with mod cuing for anterier weight shift Postural control: Posterior lean Standing balance support: Bilateral upper extremity supported;During functional activity Standing balance-Leahy Scale: Poor Standing balance comment: +2 assist                           ADL either performed or assessed with clinical judgement   ADL Overall ADL's : Needs assistance/impaired     Grooming: Wash/dry hands;Wash/dry face;Set up;Supervision/safety Grooming Details (indicate cue type and reason): min - mod cuing and forced use of R UE Upper Body Bathing: Minimal assistance;Sitting   Lower Body Bathing: Maximal assistance   Upper Body Dressing : Minimal assistance Upper Body Dressing Details (indicate cue type and reason): hospital gown       Toilet Transfer Details (indicate cue type and reason): unable secondary to safety                 Vision Patient Visual Report: No change from baseline            Cognition Arousal/Alertness: Awake/alert Behavior During Therapy: Flat affect Overall Cognitive Status: Impaired/Different from baseline  General Comments: Pt requires mod - max multimodal cuing for initiation, sequencing, and termination of tasks.                   Pertinent Vitals/ Pain       Pain Assessment: Faces Faces Pain Scale: Hurts a little bit Pain Location: generalized Pain Intervention(s): Limited activity within patient's tolerance;Repositioned;Monitored during session     Prior Functioning/Environment               Frequency  Min 2X/week        Progress Toward Goals  OT Goals(current goals can now be found in the care plan section)  Progress towards OT goals: Progressing toward goals  Acute Rehab OT Goals Patient Stated Goal: to get stronger OT Goal Formulation: With family Time For Goal Achievement: 01/31/21 Potential to Achieve Goals: Good  Plan Discharge plan remains appropriate;Frequency needs to be updated       AM-PAC OT "6 Clicks" Daily Activity     Outcome Measure   Help from another person eating meals?: A Little Help from another person taking care of personal grooming?: A Little Help from another person toileting, which includes using toliet, bedpan, or urinal?: A Lot Help from another person bathing (including washing, rinsing, drying)?: A Lot Help from another person to put on and taking off regular upper body clothing?: A Little Help from another person to put on and taking off regular lower body clothing?: A Lot 6 Click Score: 15    End of Session Equipment Utilized During Treatment: Rolling walker  OT Visit Diagnosis: Unsteadiness on feet (R26.81);Muscle weakness (generalized) (M62.81);Other symptoms and signs involving cognitive function   Activity Tolerance Patient tolerated treatment well   Patient Left in bed;with call bell/phone within reach;with bed alarm set;with family/visitor present   Nurse Communication Mobility status        Time: 3976-7341 OT Time Calculation (min): 42 min  Charges: OT General Charges $OT Visit: 1 Visit OT Treatments $Self Care/Home Management : 23-37 mins $Therapeutic Activity: 8-22 mins  Darleen Crocker, MS, OTR/L , CBIS ascom [phone]  01/20/21, 1:28 PM

## 2021-01-20 NOTE — Progress Notes (Signed)
PROGRESS NOTE    OHM DENTLER   JSE:[id number]  DOB: Apr 27, 1941  PCP: Marguarite Arbour, MD    DOA: 01/16/2021 LOS: 3   Brief Narrative   Ian King is a 80 y.o. male with medical history significant for dementia, permanent A. fib on Xarelto, Parkinson's disease, chronic diastolic CHF, seizure disorder on AEDs, who presented to [facility] ED via EMS from home due to sudden onset altered mental status in the morning.  Pt was noted to be more confused and moaning while she was giving him a shower.  While in the ER a code stroke was called.  His altered mental status gradually improved.    Patient is profoundly weak in general.  Therapy recommending SNF.  Wife would prefer to take him to home with maximum home health services and caregivers.  Daughter wants him to go to SNF.  Has required sitter for safety here, and family may pay a private sitter to be with him at [facility].    Assessment & Plan   Active Problems:   AMS (altered mental status)   Stroke (cerebrum) (HCC)    Left knee pain with swelling - reportedly new today (4/20), no known injury or trauma. --Tylenol or Tramadol PRN pain --Topical Voltaren gel --Left knee xray for further evaluation - large knee effusion seen, no acute bony abnormality --Uric acid level with AM labs (?Gout - no hx noted in brief chart review) --Will see if ortho can aspirate joint, get fluid studies --May start prednisone   AMS - seems unlikely to be due to the small punctate strokes.  Also not likely to be from seizure since last episode was 40+ years ago.  AMS likely part of the disease process of his underlying Lewy Body Dementia. 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 Code stroke was called on arrival to the ED. Pt was already on Xarelto PTA for A-fib,  switched to Eliquis --cont Eliquis, statin --PT/OT rec SNF rehab  Lewy body Dementia with behavioral disturbance Hospital delirium  Pt typically gets agitated at home and wanders out at night (sundowns), needs 24/7 supervision, per family. --Patient is in the process of being transitioned from keppra 1g bid to Lamotrigine for behavioral issues including aggression. Family noted a facial rash during this transition. Per neuro, Lamictal will not be restarted inpatient with plan to transition to a different anti seizure medicine outpatient. Plan: --cont Keppra for now, per neuro --consider Pimavanserin (Nuplazid) outpatient, per neuro --continue seroquel 50 mg nightly --seroquel 25 mg PRN for agitation --avoid Haldol, per daughter --sitter for safety  Permanent A. Fib - rate controlled. --cont home Lopressor 25 mg BID --since pt was still having what appeared to be embolic strokes, Xarelto was switched to Eliquis during this hospitalization. --cont Eliquis  Hx of Seizure - reportedly last episode >40 years ago.   --cont Keppra as above --No need for inpatient EEG, per neuro  Hyperlipidemia - cont statin  Chronic diastolic CHF - stable, appears euvolemic, compensated --Hold home Lasix  --Monitor volume status  Hypertension - s/p initial permissive HTN for the first 2 days.  --resumed on home Ramipril  --cont home Lopressor  Seborrheic dermatitis - facial lesions appears consistent --started on triamcinolone cream BID --monitor --outpatient PCP or dermatology follow up as needed  Patient BMI: Body mass index is 29.33 kg/m.   DVT prophylaxis: SCDs Start: 01/16/21 1224 apixaban (ELIQUIS) tablet 5 mg   Diet:  Diet Orders (From admission, onward)    Start     Ordered   01/18/21 1445  Diet regular Room service appropriate? Yes with Assist; Fluid consistency: Thin  Diet effective now       Comments: Please Cut meats, spaghetti and add gravy/sauce. NO Salads. Extra  BUTTERS, Sugars!  Question Answer Comment  Room service appropriate? Yes with Assist   Fluid consistency: Thin      01/18/21 1447            Code Status: DNR    Subjective 01/20/21    Pt working with OT when seen.  Wife at bedside.  Pt denies pain or other complaints.  He required significant assistance from OT to stand at walker at bedside, unable to stay upright due to weakness.  Noted to have some swelling of the Left knee and poor extension when attempting to stand.  No known injury per staff.   Disposition Plan & Communication   Status is: Inpatient  Remains inpatient appropriate because:Inpatient level of care appropriate due to severity of illness   Dispo:  Patient From: Home  Planned Disposition: Skilled Nursing Facility  Medically stable for discharge: No      Family Communication: wife at bedside on rounds today   Consults, Procedures, Significant Events   Consultants:   Neurology  Procedures:   None  Antimicrobials:  Anti-infectives (From admission, onward)   Start     Dose/Rate Route Frequency Ordered Stop   01/16/21 1215  cefTRIAXone (ROCEPHIN) 1 g in sodium chloride 0.9 % 100 mL IVPB        1 g 200 mL/hr over 30 Minutes Intravenous  Once 01/16/21 1200 01/16/21 1307        Micro    Objective   Vitals:   01/20/21 0832 01/20/21 1200 01/20/21 1220 01/20/21 1500  BP: 131/76 (!) 155/88  (!) 148/84  Pulse: 84 70  82  Resp: 16 (!) 30 (!) 22 20  Temp: 97.8 F (36.6 C) 98.8 F (37.1 C)  98.3 F (36.8 C)  TempSrc:  Oral  Oral  SpO2: 95%   96%  Weight:      Height:        Intake/Output Summary (Last 24 hours) at 01/20/2021 1808 Last data filed at 01/20/2021 1349 Gross per 24 hour  Intake 1080 ml  Output 920 ml  Net 160 ml   Filed Weights   01/16/21 1051 01/16/21 1445  Weight: 100.1 kg 98.1 kg    Physical Exam:  General exam: awake, alert, no acute distress Respiratory system: CTAB, no wheezes, rales or rhonchi, normal  respiratory effort. Cardiovascular system: normal S1/S2, RRR, no JVD, murmurs, rubs, gallops,  no pedal edema.   Gastrointestinal system: soft, NT, ND, no HSM felt, +bowel sounds. Central nervous system: no gross focal neurologic deficits, normal speech Extremities: does not fully extend hips or knees with attempt to stand, no edema Psychiatry: normal mood, congruent affect, abnormal judgement and insight   Labs   Data Reviewed: I have personally reviewed following labs and imaging studies  CBC: Recent Labs  Lab 01/16/21 1105 01/17/21 0654 01/18/21 0431 01/19/21 0538 01/20/21 0422  WBC 6.4 6.5 6.2 6.5 6.9  NEUTROABS 4.7 4.8  --   --   --   HGB 12.7* 12.4* 12.3* 12.7* 12.5*  HCT 40.1 38.1* 37.1* 37.0* 38.3*  MCV 99.8 97.7 96.4 95.1 97.0  PLT 147* 150 144* 146* 159   Basic Metabolic Panel: Recent Labs  Lab 01/16/21 1105 01/17/21 0654 01/18/21 0431 01/19/21 0538 01/20/21 0422  NA 140 138 138 139 138  K 3.9 4.2 3.4* 3.8 3.8  CL 103 102 103 106 106  CO2 29 24 25 25 24   GLUCOSE 121* 101* 134* 131* 147*  BUN 12 11 10 11 14   CREATININE 0.95 0.89 0.69 0.85 0.88  CALCIUM 9.9 9.3 9.4 9.5 9.6  MG  --  1.6* 1.9 1.9 2.0  PHOS  --  2.8  --   --   --    GFR: Estimated Creatinine Clearance: 79.9 mL/min (by C-G formula based on SCr of 0.88 mg/dL). Liver Function Tests: Recent Labs  Lab 01/16/21 1105 01/17/21 0654  AST 12* 12*  ALT 9 10  ALKPHOS 76 71  BILITOT 1.1 1.8*  PROT 7.8 7.3  ALBUMIN 4.1 3.7   No results for input(s): LIPASE, AMYLASE in the last 168 hours. Recent Labs  Lab 01/16/21 1106  AMMONIA 27   Coagulation Profile: Recent Labs  Lab 01/16/21 1105  INR 2.4*   Cardiac Enzymes: No results for input(s): CKTOTAL, CKMB, CKMBINDEX, TROPONINI in the last 168 hours. BNP (last 3 results) No results for input(s): PROBNP in the last 8760 hours. HbA1C: No results for input(s): HGBA1C in the last 72 hours. CBG: Recent Labs  Lab 01/17/21 0434 01/17/21 0755  01/18/21 2049 01/19/21 0759 01/19/21 1225  GLUCAP 89 93 150* 155* 169*   Lipid Profile: No results for input(s): CHOL, HDL, LDLCALC, TRIG, CHOLHDL, LDLDIRECT in the last 72 hours. Thyroid Function Tests: No results for input(s): TSH, T4TOTAL, FREET4, T3FREE, THYROIDAB in the last 72 hours. Anemia Panel: No results for input(s): VITAMINB12, FOLATE, FERRITIN, TIBC, IRON, RETICCTPCT in the last 72 hours. Sepsis Labs: Recent Labs  Lab 01/16/21 1110 01/16/21 1515  PROCALCITON  --  <0.10  LATICACIDVEN 1.3  --     Recent Results (from the past 240 hour(s))  Urine Culture     Status: None   Collection Time: 01/16/21 11:05 AM   Specimen: Urine, Random  Result Value Ref Range Status   Specimen Description   Final    URINE, RANDOM Performed at [facility], [address] [place], [address] [place]    Special Requests   Final    NONE Performed at [facility], [address], [place], [address] [place]    Culture   Final    NO GROWTH Performed at [facility], [address] [place], [address] [place]    Report Status 01/17/2021 FINAL  Final  Resp Panel by RT-PCR (Flu A&B, Covid) Nasopharyngeal Swab     Status: None   Collection Time: 01/16/21 11:10 AM   Specimen: Nasopharyngeal Swab; Nasopharyngeal(NP) swabs in vial transport medium  Result Value Ref Range Status   SARS Coronavirus 2 by RT PCR NEGATIVE NEGATIVE Final    Comment: (NOTE) SARS-CoV-2 target nucleic acids are NOT DETECTED.  The SARS-CoV-2 RNA is generally detectable in upper respiratory specimens during the acute phase of infection. The lowest concentration of SARS-CoV-2 viral copies this assay can detect is 138 copies/mL. A negative result does not preclude SARS-Cov-2 infection and should not be used as the sole basis for treatment or other patient management decisions. A negative result may occur with  improper specimen collection/handling, submission of specimen other than  nasopharyngeal swab, presence of viral mutation(s) within the  areas targeted by this assay, and inadequate number of viral copies(<138 copies/mL). A negative result must be combined with clinical observations, patient history, and epidemiological information. The expected result is Negative.  Fact Sheet for Patients:  BloggerCourse.com  Fact Sheet for Healthcare Providers:  SeriousBroker.it  This test is no t yet approved or cleared by the [place] FDA and  has been authorized for detection and/or diagnosis of SARS-CoV-2 by FDA under an Emergency Use Authorization (EUA). This EUA will remain  in effect (meaning this test can be used) for the duration of the COVID-19 declaration under Section 564(b)(1) of the Act, 21 U.S.C.section 360bbb-3(b)(1), unless the authorization is terminated  or revoked sooner.       Influenza A by PCR NEGATIVE NEGATIVE Final   Influenza B by PCR NEGATIVE NEGATIVE Final    Comment: (NOTE) The Xpert Xpress SARS-CoV-2/FLU/RSV plus assay is intended as an aid in the diagnosis of influenza from Nasopharyngeal swab specimens and should not be used as a sole basis for treatment. Nasal washings and aspirates are unacceptable for Xpert Xpress SARS-CoV-2/FLU/RSV testing.  Fact Sheet for Patients: BloggerCourse.com  Fact Sheet for Healthcare Providers: SeriousBroker.it  This test is not yet approved or cleared by the [place] FDA and has been authorized for detection and/or diagnosis of SARS-CoV-2 by FDA under an Emergency Use Authorization (EUA). This EUA will remain in effect (meaning this test can be used) for the duration of the COVID-19 declaration under Section 564(b)(1) of the Act, 21 U.S.C. section 360bbb-3(b)(1), unless the authorization is terminated or revoked.  Performed at [facility], [address] [place], [place]  [address]       Imaging Studies   DG Knee 1-2 Views Left  Result Date: 01/20/2021 CLINICAL DATA:  Onset left knee pain and swelling this morning. No known injury. EXAM: LEFT KNEE - 1-2 VIEW COMPARISON:  None. FINDINGS: No fracture or worrisome lesion. Small spurs off the superior and inferior patella noted. The patient has a large joint effusion. Joint spaces are preserved. Mild osteophytosis about the patella noted. 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 Atherosclerosis and vascular clips in the medial soft tissues of the knee are noted. IMPRESSION: Large knee joint effusion.  No acute bony abnormality. Chondrocalcinosis. Mild patellofemoral osteoarthritis. Electronically Signed   By: Drusilla Kanner M.D.   On: 01/20/2021 14:46     Medications   Scheduled Meds: . apixaban  5 mg Oral BID  . atorvastatin  80 mg Oral QHS  . diclofenac Sodium  2 g Topical QID  . feeding supplement  237 mL Oral BID BM  . hydrocerin   Topical BID  . levETIRAcetam  1,000 mg Oral BID  . metoprolol tartrate  25 mg Oral BID  . multivitamin with minerals  1 tablet Oral Daily  . QUEtiapine  50 mg Oral QHS  . ramipril  10 mg Oral BID  . rivastigmine  1.5 mg Oral BID  . triamcinolone cream   Topical BID   Continuous Infusions:     LOS: 3 days    Time spent: 30 minutes    Pennie Banter, DO [facility]  01/20/2021, 6:08 PM      If 7PM-7AM, please contact night-coverage. How to contact the [facility] Attending or Consulting provider 7A - 7P or covering provider during after hours 7P -7A, for this patient?    1. Check the care team in [facility] and look for a) attending/consulting TRH provider listed and b) the [facility] team listed 2.  Log into [email or web address] and use [place]'s universal password to access. If you do not have the password, please contact the hospital operator. 3. Locate the [facility] provider you are looking for under [facility] and page to a number that you can be directly reached. 4. If  you still have difficulty reaching the provider, please page the [facility] (Director on Call) for the Hospitalists listed on amion for assistance.

## 2021-01-20 NOTE — Care Management Important Message (Signed)
Important Message  Patient Details  Name: Ian King MRN: [id number] Date of Birth: March 08, 1941   Medicare Important Message Given:  Yes     Olegario Messier A Jerson Furukawa 01/20/2021, 10:47 AM

## 2021-01-20 NOTE — TOC Progression Note (Signed)
Transition of Care [facility]) - Progression Note    Patient Details  Name: Ian King MRN: [id number] Date of Birth: Feb 19, 1941  Transition of Care [facility]) CM/SW Contact  Allayne Butcher, RN Phone Number: 01/20/2021, 1:40 PM  Clinical Narrative:    Family has decided to hire sitters for the patient.  [facility] says that they can accept him tomorrow.  Patient has not required hospital sitters for over 24 hours, family mostly does not want to the patient unattended.  Insurance Berkley Harvey has been approved.     Expected Discharge Plan: Skilled Nursing Facility Barriers to Discharge: Continued Medical Work up  Expected Discharge Plan and Services Expected Discharge Plan: Skilled Nursing Facility       Living arrangements for the past 2 months: Single Family Home                                       Social Determinants of Health (SDOH) Interventions    Readmission Risk Interventions No flowsheet data found.

## 2021-01-20 NOTE — Hospital Course (Addendum)
Ian King is a 80 y.o. male with medical history significant for dementia, permanent A. fib on Xarelto, Parkinson's disease, chronic diastolic CHF, seizure disorder on AEDs, who presented to [facility] ED via EMS from home due to sudden onset altered mental status in the morning.  Pt was noted to be more confused and moaning while she was giving him a shower.  While in the ER a code stroke was called.  His altered mental status gradually improved.    Patient is profoundly weak in general.  Therapy recommending SNF.  Family are agreeable and plan to have paid caregiver be sitter at rehab.

## 2021-01-20 NOTE — Progress Notes (Signed)
PT Cancellation Note  Patient Details Name: Ian King MRN: [id number] DOB: Nov 19, 1940   Cancelled Treatment:    Reason Eval/Treat Not Completed: Pain limiting ability to participate (Treatment session attempted.  Upon arrival to session and prep for OOB activities, patient noted with significant L knee swelling, warmth; notable pain behaviors with very minimal attempts at movement. New from previous sessions.  MD informed/aware; to initiate work up of L knee.  Will follow and continue efforts as medically appropriate and pain allows.)   Rollan Roger H. Manson Passey, PT, DPT, NCS 01/20/21, 4:39 PM [phone]

## 2021-01-20 NOTE — Plan of Care (Signed)
  Problem: Education: Goal: Knowledge of General Education information will improve Description: Including pain rating scale, medication(s)/side effects and non-pharmacologic comfort measures Outcome: Progressing   Problem: Clinical Measurements: Goal: Ability to maintain clinical measurements within normal limits will improve Outcome: Progressing   Problem: Safety: Goal: Ability to remain free from injury will improve Outcome: Progressing   Problem: Skin Integrity: Goal: Risk for impaired skin integrity will decrease Outcome: Progressing   Problem: Education: Goal: Knowledge of disease or condition will improve Outcome: Progressing Goal: Knowledge of secondary prevention will improve Outcome: Progressing Goal: Knowledge of patient specific risk factors addressed and post discharge goals established will improve Outcome: Progressing   Problem: Nutrition: Goal: Risk of aspiration will decrease Outcome: Progressing   Problem: Ischemic Stroke/TIA Tissue Perfusion: Goal: Complications of ischemic stroke/TIA will be minimized Outcome: Progressing   

## 2021-01-20 NOTE — Consult Note (Signed)
ANTICOAGULATION CONSULT NOTE - Follow Up Consult  Pharmacy Consult for apixaban Indication: atrial fibrillation  Allergies  Allergen Reactions  . Doxycycline Swelling  . Lovastatin Swelling    Patient Measurements: Height: 6' (182.9 cm) Weight: 98.1 kg (216 lb 4.3 oz) IBW/kg (Calculated) : 77.6  Vital Signs: Temp: 97.8 F (36.6 C) (04/20 0832) Temp Source: Oral (04/20 0404) BP: 131/76 (04/20 0832) Pulse Rate: 84 (04/20 0832)  Labs: Recent Labs    01/18/21 0431 01/19/21 0538 01/20/21 0422  HGB 12.3* 12.7* 12.5*  HCT 37.1* 37.0* 38.3*  PLT 144* 146* 159  CREATININE 0.69 0.85 0.88    Estimated Creatinine Clearance: 79.9 mL/min (by C-G formula based on SCr of 0.88 mg/dL).   Medications:  PTA Xarelto 20 mg daily - last dose 4/15 @ 0800  Assessment:  80 y.o. male with medical history significant for dementia, A. fib on Xarelto, Parkinson's disease, CHF, and seizure disorder who presented with sudden onset altered mental status. Pt passed swallow eval 4/17. Pharmacy has been consulted to transition from enoxaparin to apixaban for afib.   Hgb: 12.4; Plts: 150  Goal of Therapy:  Anti-Xa level 0.6-1 units/ml 4hrs after LMWH dose given Monitor platelets by anticoagulation protocol: Yes   Plan:  apixaban 5 mg BID Monitor CBC per protocol, s/s of bleed  Jyllian Haynie A, PharmD 01/20/2021 10:44 AM

## 2021-01-21 DIAGNOSIS — M109 Gout, unspecified: Secondary | ICD-10-CM | POA: Diagnosis not present

## 2021-01-21 LAB — SYNOVIAL CELL COUNT + DIFF, W/ CRYSTALS
Crystals, Fluid: NONE SEEN
Eosinophils-Synovial: 0 %
Lymphocytes-Synovial Fld: 2 %
Monocyte-Macrophage-Synovial Fluid: 5 %
Neutrophil, Synovial: 93 %
WBC, Synovial: 3630 /mm3 — ABNORMAL HIGH (ref 0–200)

## 2021-01-21 LAB — CBC
HCT: 36.2 % — ABNORMAL LOW (ref 39.0–52.0)
Hemoglobin: 12 g/dL — ABNORMAL LOW (ref 13.0–17.0)
MCH: 32 pg (ref 26.0–34.0)
MCHC: 33.1 g/dL (ref 30.0–36.0)
MCV: 96.5 fL (ref 80.0–100.0)
Platelets: 158 10*3/uL (ref 150–400)
RBC: 3.75 MIL/uL — ABNORMAL LOW (ref 4.22–5.81)
RDW: 12.4 % (ref 11.5–15.5)
WBC: 6.8 10*3/uL (ref 4.0–10.5)
nRBC: 0 % (ref 0.0–0.2)

## 2021-01-21 LAB — BASIC METABOLIC PANEL
Anion gap: 6 (ref 5–15)
BUN: 14 mg/dL (ref 8–23)
CO2: 27 mmol/L (ref 22–32)
Calcium: 9.3 mg/dL (ref 8.9–10.3)
Chloride: 104 mmol/L (ref 98–111)
Creatinine, Ser: 0.85 mg/dL (ref 0.61–1.24)
GFR, Estimated: >60 mL/min (ref >60)
Glucose, Bld: 135 mg/dL — ABNORMAL HIGH (ref 70–99)
Potassium: 3.5 mmol/L (ref 3.5–5.1)
Sodium: 137 mmol/L (ref 135–145)

## 2021-01-21 LAB — MAGNESIUM: Magnesium: 2 mg/dL (ref 1.7–2.4)

## 2021-01-21 LAB — URIC ACID: Uric Acid, Serum: 4.4 mg/dL (ref 3.7–8.6)

## 2021-01-21 MED ORDER — TRIAMCINOLONE ACETONIDE 40 MG/ML IJ SUSP
80.0000 mg | Freq: Once | INTRAMUSCULAR | Status: DC
  Filled 2021-01-21: qty 2

## 2021-01-21 MED ORDER — BUPIVACAINE HCL (PF) 0.5 % IJ SOLN
10.0000 mL | Freq: Once | INTRAMUSCULAR | Status: DC
  Filled 2021-01-21: qty 10

## 2021-01-21 MED ORDER — SENNOSIDES-DOCUSATE SODIUM 8.6-50 MG PO TABS
1.0000 | ORAL_TABLET | Freq: Two times a day (BID) | ORAL | Status: DC
  Administered 2021-01-21 – 2021-01-22 (×2): 1 via ORAL
  Filled 2021-01-21: qty 1

## 2021-01-21 MED ORDER — PREDNISONE 20 MG PO TABS
40.0000 mg | ORAL_TABLET | Freq: Every day | ORAL | Status: DC
  Administered 2021-01-21 – 2021-01-22 (×2): 40 mg via ORAL
  Filled 2021-01-21 (×2): qty 2

## 2021-01-21 MED ORDER — POLYETHYLENE GLYCOL 3350 17 G PO PACK
17.0000 g | PACK | Freq: Every day | ORAL | Status: DC
  Administered 2021-01-21 – 2021-01-22 (×2): 17 g via ORAL
  Filled 2021-01-21 (×2): qty 1

## 2021-01-21 MED ORDER — BISACODYL 5 MG PO TBEC
5.0000 mg | DELAYED_RELEASE_TABLET | Freq: Every day | ORAL | Status: DC | PRN
  Administered 2021-01-22: 11:00:00 5 mg via ORAL
  Filled 2021-01-21: qty 1

## 2021-01-21 NOTE — Progress Notes (Signed)
Nutrition Follow-up  DOCUMENTATION CODES:  Not applicable  INTERVENTION:  -Ensure Enlive po BID, each supplement provides 350 kcal and 20 grams of protein -MVI with minerals daily -Feeding assistance with melas  NUTRITION DIAGNOSIS:  Increased nutrient needs related to chronic illness (CHF) as evidenced by estimated needs.  GOAL:  Patient will meet greater than or equal to 90% of their needs  MONITOR:  PO intake,Supplement acceptance,Labs,Weight trends,Skin,I & O's  REASON FOR ASSESSMENT:  Malnutrition Screening Tool    ASSESSMENT:  Pt presented to ED with AMS at home. Admitted with acute metabolic encephalopathy in the setting of acute punctuate cortical infarct. PMH includes dementia, permanent A. fib, Parkinson's disease, CHF, DM type 2, seizure disorder on AEDs. At baseline, lives at home, will need SNF at dc  Pt back to baseline mental status. Reviewed intake since admission and overall, intake appears adequate, consuming average of ~70% of each meal. Supplements in place, inquired about intake today with NT, awaiting response. Noted that [facility] team is working on [employer]. Pt will dc to SNF when medically stable with private pay sitters for supervision. Will continue current interventions to maximize nutrition status.   Average Meal Intake: 4/16-4/21: 70% average intake x 7 recorded meals  Scheduled Meds: . atorvastatin  80 mg Oral QHS  . multivitamin with minerals  1 tablet Oral Daily  . polyethylene glycol  17 g Oral Daily  . ramipril  10 mg Oral BID  . rivastigmine  1.5 mg Oral BID  . senna-docusate  1 tablet Oral BID   Relevant PRN Meds: bisacodyl  Labs reviewed:  SBG ranges from 135-169 mg/dL over the last 24 hours  Last HgbA1c 6.3% (4/17)  Diet Order:   Diet Order            Diet regular Room service appropriate? Yes with Assist; Fluid consistency: Thin  Diet effective now                EDUCATION NEEDS:  No education needs have been identified at  this time  Skin:  Skin Assessment: Reviewed RN Assessment  Last BM:  4/19 per RN documentation  Height:  Ht Readings from Last 1 Encounters:  01/16/21 6' (1.829 m)    Weight:  Wt Readings from Last 1 Encounters:  01/16/21 98.1 kg    Ideal Body Weight:  80.9 kg  BMI:  Body mass index is 29.33 kg/m.  Estimated Nutritional Needs:   Kcal:  2150-2350  Protein:  105-120 grams  Fluid:  > 2 L   Greig Castilla, RD, LDN Clinical Dietitian Pager on Amion

## 2021-01-21 NOTE — Consult Note (Signed)
ORTHOPAEDIC CONSULTATION  REQUESTING PHYSICIAN: Pennie Banter, DO  Chief Complaint:   Left knee pain and swelling.  History of Present Illness: Ian King is a 80 y.o. male with multiple medical problems including chronic atrial fibrillation requiring blood thinners, chronic diastolic congestive heart failure, diabetes, hypertension, Parkinson's disease, and dementia who lives at home with his wife.  The patient was admitted 5 days ago for altered mental status changes.  Subsequent work-up has demonstrated an apparent "acute punctate cortical infarct within the anterior left frontal lobe."  The patient has cleared sufficiently to where he is felt ready for transfer to rehab.  However, over the past 24 hours or so, he has developed a left knee effusion without any apparent injury.  The patient does have a history of gout and in fact has had treatment for gouty swelling of his left knee several years ago while recuperating from right knee surgery.  The patient denies significant pain in his knee nor does he have any fevers or chills.  He has not had any prior surgery on his left knee.  Past Medical History:  Diagnosis Date  . A-fib (HCC)   . Diabetes mellitus without complication (HCC)   . Hypertension    Past Surgical History:  Procedure Laterality Date  . arm surgery    . BACK SURGERY    . JOINT REPLACEMENT     hip  . JOINT REPLACEMENT Right    knee  . SPINE SURGERY     Social History   Socioeconomic History  . Marital status: Married    Spouse name: Not on file  . Number of children: Not on file  . Years of education: Not on file  . Highest education level: Not on file  Occupational History  . Not on file  Tobacco Use  . Smoking status: Former Smoker    Types: Cigars    Quit date: 01/16/2017    Years since quitting: 4.0  . Smokeless tobacco: Never Used  Substance and Sexual Activity  . Alcohol use:  Never  . Drug use: Never  . Sexual activity: Not on file  Other Topics Concern  . Not on file  Social History Narrative  . Not on file   Social Determinants of Health   Financial Resource Strain: Not on file  Food Insecurity: Not on file  Transportation Needs: Not on file  Physical Activity: Not on file  Stress: Not on file  Social Connections: Not on file   Family History  Problem Relation Age of Onset  . Heart disease Mother   . Heart disease Father   . Heart disease Sister   . Leukemia Brother   . Heart disease Brother   . Diabetes Brother    Allergies  Allergen Reactions  . Doxycycline Swelling  . Lovastatin Swelling   Prior to Admission medications   Medication Sig Start Date End Date Taking? Authorizing Provider  atorvastatin (LIPITOR) 40 MG tablet Take 40 mg by mouth at bedtime.   Yes [provider]  cholecalciferol (VITAMIN D3) 25 MCG (1000 UNIT) tablet Take 1,000 Units by mouth daily.   Yes [provider]  cyanocobalamin 1000 MCG tablet Take 1,000 mcg by mouth daily. 03/11/20 03/11/21 Yes [provider]  furosemide (LASIX) 20 MG tablet Take 20 mg by mouth 4 (four) times a week.    Yes [provider]  lamoTRIgine (LAMICTAL) 25 MG tablet Take 50 mg by mouth 2 (two) times daily.   Yes [provider]  levETIRAcetam (KEPPRA) 1000 MG tablet 1,000 mg 2 (two) times daily. 03/10/20  Yes [provider]  metFORMIN (GLUCOPHAGE-XR) 500 MG 24 hr tablet Take 1,000 mg by mouth in the morning and at bedtime.    Yes [provider]  metoprolol tartrate (LOPRESSOR) 25 MG tablet Take 25 mg by mouth 2 (two) times daily.   Yes [provider]  Omega-3 Fatty Acids (FISH OIL) 1000 MG CAPS Take 1,000 mg by mouth in the morning and at bedtime.   Yes [provider]  polyethylene glycol powder (GLYCOLAX/MIRALAX) 17 GM/SCOOP powder Take by mouth. 04/12/19  Yes [provider]  QUEtiapine (SEROQUEL) 25 MG  tablet Take 25 mg by mouth 2 (two) times daily.   Yes [provider]  ramipril (ALTACE) 10 MG capsule Take 10 mg by mouth 2 (two) times daily.   Yes [provider]  rivaroxaban (XARELTO) 20 MG TABS tablet Take 20 mg by mouth daily with breakfast. 01/21/20  Yes [provider]  rivastigmine (EXELON) 1.5 MG capsule Take 1.5 mg by mouth 2 (two) times daily.   Yes [provider]  senna-docusate (SENOKOT-S) 8.6-50 MG tablet Take 1 tablet by mouth daily.   Yes [provider]  ketoconazole (NIZORAL) 2 % shampoo Apply 1 application topically 2 (two) times a week.    [provider]   DG Knee 1-2 Views Left  Result Date: 01/20/2021 CLINICAL DATA:  Onset left knee pain and swelling this morning. No known injury. EXAM: LEFT KNEE - 1-2 VIEW COMPARISON:  None. FINDINGS: No fracture or worrisome lesion. Small spurs off the superior and inferior patella noted. The patient has a large joint effusion. Joint spaces are preserved. Mild osteophytosis about the patella noted. 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 Atherosclerosis and vascular clips in the medial soft tissues of the knee are noted. IMPRESSION: Large knee joint effusion.  No acute bony abnormality. Chondrocalcinosis. Mild patellofemoral osteoarthritis. Electronically Signed   By: Drusilla Kanner M.D.   On: 01/20/2021 14:46    Positive ROS: All other systems have been reviewed and were otherwise negative with the exception of those mentioned in the HPI and as above.  Physical Exam: General:  Alert, no acute distress Psychiatric:  Patient is competent for consent with normal mood and affect   Cardiovascular:  No pedal edema Respiratory:  No wheezing, non-labored breathing GI:  Abdomen is soft and non-tender Skin:  No lesions in the area of chief complaint Neurologic:  Sensation intact distally Lymphatic:  No axillary or cervical lymphadenopathy  Orthopedic Exam:  Orthopedic examination is  limited to the left knee and lower extremity.  Skin inspection around the left knee is notable for moderate swelling, but there is no erythema, ecchymosis, abrasions, or other skin abnormalities identified.  He exhibits a 1-2+ effusion.  There is only mild tenderness palpation over the medial lateral aspects of the knee.  He is able to actively extend his knee and to flex his knee to near 90 degrees, although with some discomfort.  He is neurovascularly intact to the left lower extremity and foot.  X-rays:  Recent x-rays of the left knee are available for review and have been reviewed by myself.  These films demonstrate moderate degenerative changes, primarily involving the lateral compartment as manifest by joint space narrowing and osteophyte formation.  Moderate calcification of both the medial and lateral menisci also were noted.  No fractures or lytic lesions are identified.  A large effusion also is noted  on the lateral view.  Assessment: Left knee effusion, probably secondary to gout.  Plan: The treatment options have been discussed with the patient and his wife, who is at the bedside.  The patient is offered and accepts a left knee aspiration with steroid injection to help improve his symptoms and to reduce the swelling.  After obtaining verbal consent from his wife, the left knee was aspirated sterilely of 65 cc of slightly turbid straw-colored fluid before the knee was injected using a total of 2 cc of Kenalog-40 (80 mg) and 8 cc of 0.5% Sensorcaine.  The patient tolerated the procedure well.  The fluid has been sent for cell count and differential, crystals, culture sensitivity, and gram stain.  The patient may be mobilized with physical therapy, weightbearing as tolerated on the left leg and using any assistive device felt to be appropriate.  Thank you for asking me to participate in the care of this most pleasant yet unfortunate man.  I will be happy to follow him with you.   Maryagnes Amos, MD  Beeper #:  [phone]  01/21/2021 3:27 PM

## 2021-01-21 NOTE — TOC Progression Note (Signed)
Transition of Care [facility]) - Progression Note    Patient Details  Name: Ian King MRN: [id number] Date of Birth: Feb 15, 1941  Transition of Care [facility]) CM/SW Contact  Allayne Butcher, RN Phone Number: 01/21/2021, 4:13 PM  Clinical Narrative:    Patient had fluid aspirated from his knee today.  Plan is for discharge to [facility] tomorrow.  Insurance authorization per Francine Graven is good through 4/23- RNCM will send updated clinicals.    Expected Discharge Plan: Skilled Nursing Facility Barriers to Discharge: Continued Medical Work up  Expected Discharge Plan and Services Expected Discharge Plan: Skilled Nursing Facility       Living arrangements for the past 2 months: Single Family Home                                       Social Determinants of Health (SDOH) Interventions    Readmission Risk Interventions No flowsheet data found.

## 2021-01-21 NOTE — Progress Notes (Addendum)
PROGRESS NOTE    Ian King   ZOX:[id number]  DOB: 04/04/1941  PCP: Marguarite Arbour, MD    DOA: 01/16/2021 LOS: 4   Brief Narrative   Ian King is a 80 y.o. male with medical history significant for dementia, permanent A. fib on Xarelto, Parkinson's disease, chronic diastolic CHF, seizure disorder on AEDs, who presented to [facility] ED via EMS from home due to sudden onset altered mental status in the morning.  Pt was noted to be more confused and moaning while she was giving him a shower.  While in the ER a code stroke was called.  His altered mental status gradually improved.    Patient is profoundly weak in general.  Therapy recommending SNF.  Wife would prefer to take him to home with maximum home health services and caregivers.  Daughter wants him to go to SNF.  Has required sitter for safety here, and family may pay a private sitter to be with him at [facility].    Assessment & Plan   Active Problems:   AMS (altered mental status)   Stroke (cerebrum) (HCC)   Gout flare    Left knee pain with swelling most likely due to Gout - reportedly new on 4/20, no known injury or trauma.  Wife reports hx of gout.  Uric acid level normal.  Left knee xray on 4/20 shows a large joint effusion and degenerative changes. --Ortho consulted for aspiration of synovial fluid --Follow fluid studies --Start prednisone 40 mg PO daily, send on taper at d/c --Tylenol or Tramadol PRN pain --Topical Voltaren gel  AMS - seems unlikely to be due to the small punctate strokes.  Also not likely to be from seizure since last episode was 40+ years ago.  AMS likely part of the disease process of his underlying Lewy Body Dementia. 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 Code stroke was called on arrival to the  ED. Pt was already on Xarelto PTA for A-fib, switched to Eliquis --cont Eliquis, statin --PT/OT rec SNF rehab  Lewy body Dementia with behavioral disturbance Hospital delirium  Pt typically gets agitated at home and wanders out at night (sundowns), needs 24/7 supervision, per family. --Patient is in the process of being transitioned from keppra 1g bid to Lamotrigine for behavioral issues including aggression. Family noted a facial rash during this transition. Per neuro, Lamictal will not be restarted inpatient with plan to transition to a different anti seizure medicine outpatient. Plan: --cont Keppra for now, per neuro --consider Pimavanserin (Nuplazid) outpatient, per neuro --continue seroquel 50 mg nightly --seroquel 25 mg PRN for agitation --avoid Haldol, per daughter --sitter for safety  Permanent A. Fib - rate controlled. --cont home Lopressor 25 mg BID --since pt was still having what appeared to be embolic strokes, Xarelto was switched to Eliquis during this hospitalization. --cont Eliquis  Hx of Seizure - reportedly last episode >40 years ago.   --cont Keppra as above --No need for inpatient EEG, per neuro  Hyperlipidemia - cont statin  Chronic diastolic CHF - stable, appears euvolemic, compensated --Hold home Lasix  --Monitor volume status  Hypertension - s/p initial permissive HTN for the first 2 days.  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   DVT prophylaxis: SCDs Start: 01/16/21 1224 apixaban (ELIQUIS) tablet 5 mg   Diet:  Diet Orders (From admission, onward)    Start     Ordered   01/18/21 1445  Diet regular Room service appropriate? Yes with Assist; Fluid consistency: Thin  Diet effective now       Comments: Please Cut meats,  spaghetti and add gravy/sauce. NO Salads. Extra BUTTERS, Sugars!  Question Answer Comment  Room service appropriate? Yes with Assist   Fluid consistency: Thin      01/18/21 1447            Code Status: DNR    Subjective 01/21/21    Pt sitting up in bed with wife assisting him to eat lunch.  Awaiting ortho to see at time of encounter.  Pt reports knee still hurts.  Wife reports he does have hx of gout.  Pt denies fever/chills, chest pain, N/V or other complaints.   Disposition Plan & Communication   Status is: Inpatient  Remains inpatient appropriate because:Inpatient level of care appropriate due to severity of illness.  Pt developed a new knee effusion warranting evaluation as knee pain interferes with ability to participate in PT.  Going to SNF likely tomorrow if improved.   Dispo:  Patient From: Home  Planned Disposition: Skilled Nursing Facility  Medically stable for discharge: No      Family Communication: wife at bedside on rounds today   Consults, Procedures, Significant Events   Consultants:   Neurology  Procedures:   None  Antimicrobials:  Anti-infectives (From admission, onward)   Start     Dose/Rate Route Frequency Ordered Stop   01/16/21 1215  cefTRIAXone (ROCEPHIN) 1 g in sodium chloride 0.9 % 100 mL IVPB        1 g 200 mL/hr over 30 Minutes Intravenous  Once 01/16/21 1200 01/16/21 1307        Micro    Objective   Vitals:   01/21/21 0341 01/21/21 0744 01/21/21 1137 01/21/21 1546  BP: 127/78 (!) 156/81 (!) 150/59 (!) 153/77  Pulse: 68 73 (!) 54 70  Resp: 20 17 17 17   Temp: 98.3 F (36.8 C) 97.7 F (36.5 C) (!) 97.4 F (36.3 C) 98.9 F (37.2 C)  TempSrc:  Oral Oral Oral  SpO2: 97% 98% 98% 96%  Weight:      Height:        Intake/Output Summary (Last 24 hours) at 01/21/2021 1645 Last data filed at 01/20/2021 2200 Gross per 24 hour  Intake 720 ml  Output 850 ml  Net -130 ml   Filed Weights   01/16/21 1051 01/16/21 1445   Weight: 100.1 kg 98.1 kg    Physical Exam:  General exam: awake, alert, no acute distress Respiratory system: CTAB, no wheezes, rales or rhonchi, normal respiratory effort. Cardiovascular system: normal S1/S2, RRR Gastrointestinal system: soft and non-tender. Central nervous system: no gross focal neurologic deficits, normal speech Extremities: Left knee swelling with differential warmth on palpation and mild erythema, moves all, normal tone  Labs   Data Reviewed: I have personally reviewed following labs and imaging studies  CBC: Recent Labs  Lab 01/16/21 1105 01/17/21 0654 01/18/21 0431 01/19/21 0538 01/20/21 0422 01/21/21 0338  WBC 6.4 6.5 6.2 6.5 6.9 6.8  NEUTROABS 4.7 4.8  --   --   --   --   HGB 12.7* 12.4*  12.3* 12.7* 12.5* 12.0*  HCT 40.1 38.1* 37.1* 37.0* 38.3* 36.2*  MCV 99.8 97.7 96.4 95.1 97.0 96.5  PLT 147* 150 144* 146* 159 158   Basic Metabolic Panel: Recent Labs  Lab 01/17/21 0654 01/18/21 0431 01/19/21 0538 01/20/21 0422 01/21/21 0338  NA 138 138 139 138 137  K 4.2 3.4* 3.8 3.8 3.5  CL 102 103 106 106 104  CO2 24 25 25 24 27   GLUCOSE 101* 134* 131* 147* 135*  BUN 11 10 11 14 14   CREATININE 0.89 0.69 0.85 0.88 0.85  CALCIUM 9.3 9.4 9.5 9.6 9.3  MG 1.6* 1.9 1.9 2.0 2.0  PHOS 2.8  --   --   --   --    GFR: Estimated Creatinine Clearance: 82.7 mL/min (by C-G formula based on SCr of 0.85 mg/dL). Liver Function Tests: Recent Labs  Lab 01/16/21 1105 01/17/21 0654  AST 12* 12*  ALT 9 10  ALKPHOS 76 71  BILITOT 1.1 1.8*  PROT 7.8 7.3  ALBUMIN 4.1 3.7   No results for input(s): LIPASE, AMYLASE in the last 168 hours. Recent Labs  Lab 01/16/21 1106  AMMONIA 27   Coagulation Profile: Recent Labs  Lab 01/16/21 1105  INR 2.4*   Cardiac Enzymes: No results for input(s): CKTOTAL, CKMB, CKMBINDEX, TROPONINI in the last 168 hours. BNP (last 3 results) No results for input(s): PROBNP in the last 8760 hours. HbA1C: No results for  input(s): HGBA1C in the last 72 hours. CBG: Recent Labs  Lab 01/17/21 0434 01/17/21 0755 01/18/21 2049 01/19/21 0759 01/19/21 1225  GLUCAP 89 93 150* 155* 169*   Lipid Profile: No results for input(s): CHOL, HDL, LDLCALC, TRIG, CHOLHDL, LDLDIRECT in the last 72 hours. Thyroid Function Tests: No results for input(s): TSH, T4TOTAL, FREET4, T3FREE, THYROIDAB in the last 72 hours. Anemia Panel: No results for input(s): VITAMINB12, FOLATE, FERRITIN, TIBC, IRON, RETICCTPCT in the last 72 hours. Sepsis Labs: Recent Labs  Lab 01/16/21 1110 01/16/21 1515  PROCALCITON  --  <0.10  LATICACIDVEN 1.3  --     Recent Results (from the past 240 hour(s))  Urine Culture     Status: None   Collection Time: 01/16/21 11:05 AM   Specimen: Urine, Random  Result Value Ref Range Status   Specimen Description   Final    URINE, RANDOM Performed at [facility], [address] [place], [place] [address]    Special Requests   Final    NONE Performed at [facility], [address] [place], [place] [address]    Culture   Final    NO GROWTH Performed at [facility], [address] [place], [place] [address]    Report Status 01/17/2021 FINAL  Final  Resp Panel by RT-PCR (Flu A&B, Covid) Nasopharyngeal Swab     Status: None   Collection Time: 01/16/21 11:10 AM   Specimen: Nasopharyngeal Swab; Nasopharyngeal(NP) swabs in vial transport medium  Result Value Ref Range Status   SARS Coronavirus 2 by RT PCR NEGATIVE NEGATIVE Final    Comment: (NOTE) SARS-CoV-2 target nucleic acids are NOT DETECTED.  The SARS-CoV-2 RNA is generally detectable in upper respiratory specimens during the acute phase of infection. The lowest concentration of SARS-CoV-2 viral copies this assay can detect is 138 copies/mL. A negative result does not preclude SARS-Cov-2 infection and should not be used as the sole basis for treatment or other patient management decisions. A negative result may  occur with  improper specimen collection/handling,  submission of specimen other than nasopharyngeal swab, presence of viral mutation(s) within the areas targeted by this assay, and inadequate number of viral copies(<138 copies/mL). A negative result must be combined with clinical observations, patient history, and epidemiological information. The expected result is Negative.  Fact Sheet for Patients:  BloggerCourse.com  Fact Sheet for Healthcare Providers:  SeriousBroker.it  This test is no t yet approved or cleared by the [place] FDA and  has been authorized for detection and/or diagnosis of SARS-CoV-2 by FDA under an Emergency Use Authorization (EUA). This EUA will remain  in effect (meaning this test can be used) for the duration of the COVID-19 declaration under Section 564(b)(1) of the Act, 21 U.S.C.section 360bbb-3(b)(1), unless the authorization is terminated  or revoked sooner.       Influenza A by PCR NEGATIVE NEGATIVE Final   Influenza B by PCR NEGATIVE NEGATIVE Final    Comment: (NOTE) The Xpert Xpress SARS-CoV-2/FLU/RSV plus assay is intended as an aid in the diagnosis of influenza from Nasopharyngeal swab specimens and should not be used as a sole basis for treatment. Nasal washings and aspirates are unacceptable for Xpert Xpress SARS-CoV-2/FLU/RSV testing.  Fact Sheet for Patients: BloggerCourse.com  Fact Sheet for Healthcare Providers: SeriousBroker.it  This test is not yet approved or cleared by the [place] FDA and has been authorized for detection and/or diagnosis of SARS-CoV-2 by FDA under an Emergency Use Authorization (EUA). This EUA will remain in effect (meaning this test can be used) for the duration of the COVID-19 declaration under Section 564(b)(1) of the Act, 21 U.S.C. section 360bbb-3(b)(1), unless the authorization is terminated  or revoked.  Performed at [facility], [address] [place], [place] [address]   SARS CORONAVIRUS 2 (TAT 6-24 HRS) Nasopharyngeal Nasopharyngeal Swab     Status: None   Collection Time: 01/20/21  1:30 PM   Specimen: Nasopharyngeal Swab  Result Value Ref Range Status   SARS Coronavirus 2 NEGATIVE NEGATIVE Final    Comment: (NOTE) SARS-CoV-2 target nucleic acids are NOT DETECTED.  The SARS-CoV-2 RNA is generally detectable in upper and lower respiratory specimens during the acute phase of infection. Negative results do not preclude SARS-CoV-2 infection, do not rule out co-infections with other pathogens, and should not be used as the sole basis for treatment or other patient management decisions. Negative results must be combined with clinical observations, patient history, and epidemiological information. The expected result is Negative.  Fact Sheet for Patients: HairSlick.no  Fact Sheet for Healthcare Providers: quierodirigir.com  This test is not yet approved or cleared by the [place] FDA and  has been authorized for detection and/or diagnosis of SARS-CoV-2 by FDA under an Emergency Use Authorization (EUA). This EUA will remain  in effect (meaning this test can be used) for the duration of the COVID-19 declaration under Se ction 564(b)(1) of the Act, 21 U.S.C. section 360bbb-3(b)(1), unless the authorization is terminated or revoked sooner.  Performed at [facility], [address], [place], [place] [address]       Imaging Studies   DG Knee 1-2 Views Left  Result Date: 01/20/2021 CLINICAL DATA:  Onset left knee pain and swelling this morning. No known injury. EXAM: LEFT KNEE - 1-2 VIEW COMPARISON:  None. FINDINGS: No fracture or worrisome lesion. Small spurs off the superior and inferior patella noted. The patient has a large joint effusion. Joint spaces are preserved. Mild osteophytosis  about the patella noted. 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 Atherosclerosis and vascular  clips in the medial soft tissues of the knee are noted. IMPRESSION: Large knee joint effusion.  No acute bony abnormality. Chondrocalcinosis. Mild patellofemoral osteoarthritis. Electronically Signed   By: Drusilla Kanner M.D.   On: 01/20/2021 14:46     Medications   Scheduled Meds: . apixaban  5 mg Oral BID  . atorvastatin  80 mg Oral QHS  . bupivacaine  10 mL Infiltration Once  . diclofenac Sodium  2 g Topical QID  . feeding supplement  237 mL Oral BID BM  . hydrocerin   Topical BID  . levETIRAcetam  1,000 mg Oral BID  . metoprolol tartrate  25 mg Oral BID  . multivitamin with minerals  1 tablet Oral Daily  . polyethylene glycol  17 g Oral Daily  . predniSONE  40 mg Oral Q breakfast  . QUEtiapine  50 mg Oral QHS  . ramipril  10 mg Oral BID  . rivastigmine  1.5 mg Oral BID  . senna-docusate  1 tablet Oral BID  . triamcinolone acetonide  80 mg Intra-articular Once  . triamcinolone cream   Topical BID   Continuous Infusions:     LOS: 4 days    Time spent: 30 minutes with > 50% spent at bedside and in coordination of care    Pennie Banter, DO [facility]  01/21/2021, 4:45 PM      If 7PM-7AM, please contact night-coverage. How to contact the [facility] Attending or Consulting provider 7A - 7P or covering provider during after hours 7P -7A, for this patient?    1. Check the care team in [facility] and look for a) attending/consulting [facility] provider listed and b) the [facility] team listed 2. Log into [email or web address] and use [place]'s universal password to access. If you do not have the password, please contact the hospital operator. 3. Locate the [facility] provider you are looking for under [facility] and page to a number that you can be directly reached. 4. If you still have difficulty reaching the provider, please page the [facility] (Director on Call) for the Hospitalists listed on amion for  assistance.

## 2021-01-21 NOTE — Progress Notes (Signed)
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    PT Comments    Pt was long sitting in bed with super supportive spouse at bedside. Pt is oriented to self only. Per spouse, at baseline cognition. Pt was pleasantly confused and was able to follow simple one step commands with increased time. He required max assist to progress from supine to EOB sitting. Cognition limits abilities more so than physical limitation.  Once seated EOB, required min assist at times to prevent LOB. Stood to [employer] and was able to ambulate to doorway of room and back. Pt is unsteady during gait but tolerated well. Recommend DC to SNF to address deficits while progressing pt to PLOF. Acute PT will continue to follow and progress as able per POC.     Follow Up Recommendations  SNF     Equipment Recommendations   (defer to next level of care)       Precautions / Restrictions Precautions Precautions: Fall Restrictions Weight Bearing Restrictions: No    Mobility  Bed Mobility Overal bed mobility: Needs Assistance Bed Mobility: Rolling;Sidelying to Sit;Supine to Sit;Sit to Supine Rolling: Mod assist;Max assist Sidelying to sit: Max assist Supine to sit: Max assist Sit to supine: Max assist   General bed mobility comments: Pt has difficulty inititating movements due to cognition. performed max assist to achieve short sit EOB and max assist after OOB activity    Transfers Overall transfer level: Needs assistance Equipment used: Rolling walker (2 wheeled) Transfers: Sit to/from Stand Sit to Stand: Min assist;Mod assist;From elevated surface (slightly elevated bed height)         General transfer comment: Pt performed STS 3 x total form  EOB. required assistance to inititae movements but once starts desired task, was able to perform with min assist  Ambulation/Gait Ambulation/Gait assistance: Min assist;Mod assist Gait Distance (Feet): 25 Feet Assistive device: Rolling walker (2 wheeled) Gait Pattern/deviations: Trunk flexed;Antalgic;Step-through pattern Gait velocity: decreased   General Gait Details: Pt was able to ambulate to doorway of room and return with min assist mostly. mod assist during turning to prevent LOB.      Balance Overall balance assessment: Needs assistance Sitting-balance support: No upper extremity supported;Feet supported Sitting balance-Leahy Scale: Fair Sitting balance - Comments: mostly close supervision but did require min assist 2 x to prevent LOB posteriorly   Standing balance support: Bilateral upper extremity supported;During functional activity Standing balance-Leahy Scale: Fair Standing balance comment: reliant on RW for standing balance/support       Cognition Arousal/Alertness: Awake/alert Behavior During Therapy: Flat affect Overall Cognitive Status: Impaired/Different from baseline (Has) Area of Impairment: Orientation;Following commands;Memory;Safety/judgement;Awareness;Problem solving        General Comments: Pt is oriented to self only. pleasantly confused throughout. Per spouse, at baseline cognition. Sundowns after 4 pm             Pertinent Vitals/Pain Pain Assessment: No/denies pain Faces Pain Scale: No hurt           PT Goals (current goals can now be found in the care plan section) Acute Rehab PT Goals Patient Stated Goal: none stated Progress towards PT goals: Progressing toward goals    Frequency    Min 2X/week      PT Plan Current plan  remains appropriate       AM-PAC PT "6 Clicks" Mobility   Outcome Measure  Help needed turning from your back to your side while in a flat bed without using bedrails?: A Lot Help needed moving from lying  on your back to sitting on the side of a flat bed without using bedrails?: A Lot Help needed moving to and from a bed to a chair (including a wheelchair)?: A Lot Help needed standing up from a chair using your arms (e.g., wheelchair or bedside chair)?: A Lot Help needed to walk in hospital room?: A Lot Help needed climbing 3-5 steps with a railing? : A Lot 6 Click Score: 12    End of Session Equipment Utilized During Treatment: Gait belt Activity Tolerance: Patient tolerated treatment well Patient left: in bed;with call bell/phone within reach;with bed alarm set;with family/visitor present Nurse Communication: Mobility status PT Visit Diagnosis: Muscle weakness (generalized) (M62.81);Difficulty in walking, not elsewhere classified (R26.2)     Time: 6734-1937 PT Time Calculation (min) (ACUTE ONLY): 24 min  Charges:  $Gait Training: 8-22 mins $Therapeutic Activity: 8-22 mins                     Jetta Lout PTA 01/21/21, 4:40 PM

## 2021-01-22 LAB — C-REACTIVE PROTEIN: CRP: 12.8 mg/dL — ABNORMAL HIGH (ref <1.0)

## 2021-01-22 LAB — SEDIMENTATION RATE: Sed Rate: 72 mm/hr — ABNORMAL HIGH (ref 0–20)

## 2021-01-22 MED ORDER — BISACODYL 10 MG RE SUPP
10.0000 mg | Freq: Every day | RECTAL | Status: DC | PRN
  Administered 2021-01-22: 10 mg via RECTAL
  Filled 2021-01-22: qty 1

## 2021-01-22 MED ORDER — HYDROCERIN EX CREA: 1.0000 "application " | TOPICAL_CREAM | Freq: Two times a day (BID) | CUTANEOUS | 0 refills | Status: DC

## 2021-01-22 MED ORDER — BISACODYL 5 MG PO TBEC: 5.0000 mg | DELAYED_RELEASE_TABLET | Freq: Every day | ORAL | 0 refills | Status: DC | PRN

## 2021-01-22 MED ORDER — ENSURE ENLIVE PO LIQD: 237.0000 mL | Freq: Two times a day (BID) | ORAL | 12 refills | Status: DC

## 2021-01-22 MED ORDER — ATORVASTATIN CALCIUM 20 MG PO TABS: 80.0000 mg | ORAL_TABLET | Freq: Every day | ORAL | 1 refills | Status: DC

## 2021-01-22 MED ORDER — RIVASTIGMINE TARTRATE 1.5 MG PO CAPS: 1.5000 mg | ORAL_CAPSULE | Freq: Two times a day (BID) | ORAL | Status: DC

## 2021-01-22 MED ORDER — QUETIAPINE FUMARATE 25 MG PO TABS: 25.0000 mg | ORAL_TABLET | Freq: Two times a day (BID) | ORAL | Status: DC | PRN

## 2021-01-22 MED ORDER — POLYETHYLENE GLYCOL 3350 17 G PO PACK: 17.0000 g | PACK | Freq: Every day | ORAL | 0 refills | Status: DC

## 2021-01-22 MED ORDER — ACETAMINOPHEN 325 MG PO TABS: 650.0000 mg | ORAL_TABLET | Freq: Four times a day (QID) | ORAL | Status: DC | PRN

## 2021-01-22 MED ORDER — BISACODYL 10 MG RE SUPP: 10.0000 mg | Freq: Every day | RECTAL | 0 refills | Status: DC | PRN

## 2021-01-22 MED ORDER — SENNOSIDES-DOCUSATE SODIUM 8.6-50 MG PO TABS: 1.0000 | ORAL_TABLET | Freq: Two times a day (BID) | ORAL | Status: DC

## 2021-01-22 MED ORDER — MAGNESIUM CITRATE PO SOLN
1.0000 | Freq: Once | ORAL | Status: DC | PRN
  Filled 2021-01-22: qty 296

## 2021-01-22 MED ORDER — ADULT MULTIVITAMIN W/MINERALS CH: 1.0000 | ORAL_TABLET | Freq: Every day | ORAL | Status: DC

## 2021-01-22 MED ORDER — APIXABAN 5 MG PO TABS: 5.0000 mg | ORAL_TABLET | Freq: Two times a day (BID) | ORAL | Status: DC

## 2021-01-22 MED ORDER — QUETIAPINE FUMARATE 50 MG PO TABS: 50.0000 mg | ORAL_TABLET | Freq: Every day | ORAL | Status: DC

## 2021-01-22 MED ORDER — TRIAMCINOLONE ACETONIDE 0.1 % EX CREA
TOPICAL_CREAM | Freq: Two times a day (BID) | CUTANEOUS | 0 refills | Status: DC
Start: 2021-01-22 — End: 2022-12-23

## 2021-01-22 MED ORDER — PREDNISONE 20 MG PO TABS: 40.0000 mg | ORAL_TABLET | Freq: Every day | ORAL | 0 refills | Status: AC

## 2021-01-22 NOTE — Progress Notes (Signed)
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   OT comments  Upon entering the room, pt supine in bed with wife present in the room. Pt is agreeable to OT intervention and is smiling and very verbose during session. Pt internally and externally distracted during session and OT attempting to make environment less distracting for command following. Pt needing max A for B LEs and trunk support to EOB. Pt does appear to exhibit R inattention and needing max cuing to locate and visually scan items to the R this session. Pt standing from standard height bed with direct command to stand with assist for hand placement and min - mod lifting assistance. Pt able to transfer to the R onto [facility] for possible urge for BM. Pt only has gas and returns to bed at end of session. Pt with increased difficulty sequencing stand pivot back to bed. Sit >supine with max A and repositioning. Pt's wife present in room during this session.   Follow Up Recommendations  SNF    Equipment Recommendations  3 in 1 bedside commode;Tub/shower seat       Precautions / Restrictions Precautions Precautions: Fall       Mobility Bed Mobility Overal bed mobility: Needs Assistance Bed Mobility: Supine to Sit;Sit to Supine     Supine to sit: Max assist Sit to supine: Max assist   General bed mobility comments: assist for B LEs and trunk    Transfers Overall transfer level: Needs assistance Equipment used: Rolling walker (2 wheeled) Transfers: Sit to/from Stand Sit to Stand: Min assist;Mod assist         General transfer comment: simple direct cues needed "stand up". Pt needing assistance for hand placement.    Balance Overall balance  assessment: Needs assistance Sitting-balance support: No upper extremity supported;Feet supported Sitting balance-Leahy Scale: Fair Sitting balance - Comments: mostly close supervision but did require min assist Postural control: Posterior lean Standing balance support: Bilateral upper extremity supported;During functional activity Standing balance-Leahy Scale: Fair Standing balance comment: reliant on RW for standing balance/support                           ADL either performed or assessed with clinical judgement   ADL                           Toilet Transfer: Moderate assistance;BSC;RW                   Vision   Vision Assessment?: Vision impaired- to be further tested in functional context Additional Comments: Pt appears to have R inattention with max cuing needed to locate R side and to visually scan to the R.          Cognition Arousal/Alertness: Awake/alert Behavior During Therapy: Restless;Impulsive Overall Cognitive Status: Impaired/Different from baseline Area of Impairment: Orientation;Following commands;Memory;Safety/judgement;Awareness;Problem solving                               General Comments: Pt oriented to self. He is internally and externally distracted needing max multimodal cuing to follow commands.  Pertinent Vitals/ Pain       Pain Assessment: No/denies pain Faces Pain Scale: No hurt Pain Location: generalized         Frequency  Min 2X/week        Progress Toward Goals  OT Goals(current goals can now be found in the care plan section)  Progress towards OT goals: Progressing toward goals  Acute Rehab OT Goals Patient Stated Goal: to get better and go to rehab OT Goal Formulation: With patient/family Time For Goal Achievement: 01/31/21 Potential to Achieve Goals: Good  Plan Discharge plan remains appropriate;Frequency needs to be updated       AM-PAC OT "6 Clicks"  Daily Activity     Outcome Measure   Help from another person eating meals?: A Little   Help from another person toileting, which includes using toliet, bedpan, or urinal?: A Lot Help from another person bathing (including washing, rinsing, drying)?: A Lot Help from another person to put on and taking off regular upper body clothing?: A Little Help from another person to put on and taking off regular lower body clothing?: A Lot 6 Click Score: 12    End of Session Equipment Utilized During Treatment: Rolling walker  OT Visit Diagnosis: Unsteadiness on feet (R26.81);Muscle weakness (generalized) (M62.81);Other symptoms and signs involving cognitive function   Activity Tolerance Patient tolerated treatment well   Patient Left in bed;with call bell/phone within reach;with bed alarm set;with family/visitor present   Nurse Communication Mobility status        Time: 1020-1105 OT Time Calculation (min): 45 min  Charges: OT General Charges $OT Visit: 1 Visit OT Treatments $Self Care/Home Management : 38-52 mins  Jackquline Denmark, MS, OTR/L , CBIS ascom [phone]  01/22/21, 12:54 PM

## 2021-01-22 NOTE — Progress Notes (Signed)
Called and gave report to [facility] LPN at [facility] going to room 704

## 2021-01-22 NOTE — Discharge Summary (Addendum)
Physician Discharge Summary  Ian King ASN:[id number] DOB: 1941/01/03 DOA: 01/16/2021  PCP: Idelle Crouch, MD  Admit date: 01/16/2021 Discharge date: 01/22/2021  Admitted From: home Disposition:  SNF  Recommendations for Outpatient Follow-up:  1. Follow up with PCP in 1-2 weeks 2. Please obtain BMP/CBC in one week 3. Please follow up on synovial fluid cultures from left knee (preliminary no organisms seen) 4. Follow up with neurology, Dr. Manuella Ghazi  Home Health: No  Equipment/Devices: None   Discharge Condition: Stable  CODE STATUS: DNR  Diet recommendation: Regular - cut up meats   Discharge Diagnoses: Active Problems:   AMS (altered mental status)   Stroke (cerebrum) (HCC)   Gout flare    Summary of HPI and Hospital Course:  Ian King is a 80 y.o. male with medical history significant for dementia, permanent A. fib on Xarelto, Parkinson's disease, chronic diastolic CHF, seizure disorder on AEDs, who presented to [facility] ED via EMS from home due to sudden onset altered mental status in the morning.  Pt was noted to be more confused and moaning while she was giving him a shower.  While in the ER a code stroke was called.  His altered mental status gradually improved.    Patient is profoundly weak in general.  Therapy recommending SNF.  Family are agreeable and plan to have paid caregiver be sitter at rehab.      Left knee pain with swelling most likely due to inflammatory arthritis -  Reportedly new onset on 4/20, no known injury or trauma.  Wife reports hx of gout.   Left knee xray on 4/20 - large joint effusion and degenerative changes. Ortho aspirated the knee on 4/21 Synovial fluid was negative for any crystals, serum uric acid normal - unlikely gout. ESR is elevated.   --Follow fluid studies - wbc's present, no organisms on gram stain --prednisone 40 mg PO daily x 5 days --Tylenol PRN pain --PCP follow up, consider rheumatology referral if recurrent  AMS - seems  unlikely to be due to the small punctate strokes. Also not likely to be from seizure since last episode was 40+ years ago. AMS likely part of the disease process of his underlying Lewy Body Dementia. 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 Code stroke was called on arrival to the ED. Pt was already on Xarelto PTA for A-fib, switched to Eliquis --cont Eliquis, statin --PT/OT rec SNF rehab  Lewy body Dementia with behavioral disturbance Hospital delirium  Pt typically gets agitated at home and wanders out at night (sundowns), needs 24/7 supervision, per family. --Patient is in the process of being transitioned from keppra 1g bid to Lamotrigine for behavioral issues including aggression. Family noted a facial rash during this transition. Per neuro, Lamictal will not be restarted inpatient with plan to transition to a different anti seizure medicine outpatient. Plan: --cont Keppra --cont rivastigmine --consider Pimavanserin (Nuplazid) outpatient, per neuro --continue scheduled seroquel 50 mg nightly --may use seroquel 25 mg PRN for agitation --avoid Haldol, per daughter --sitter for safety  Permanent A. Fib - rate controlled. --Lopressor 25 mg BID --since pt was still having what appeared to be embolic strokes despite Xarelto, anticoagulation was switched to Eliquis during this hospitalization. --cont Eliquis BID  Hx of Seizure - reportedly last episode >40 years ago.  --cont Keppra  as above --Outpatient had been adding Lamictal, family reported onset of rash with this, it was stopped and not resumed at time of d/c' --Follow up with primary neurologist, Dr. Manuella Ghazi --No need for inpatient EEG, per neuro  Hyperlipidemia - cont statin  Chronic diastolic CHF - stable, appears euvolemic,  compensated --resume home Lasix (takes 4 times weekly) --daily weights to monitor volume status  Hypertension - s/p initial permissive HTN for the first 2 days given stroke.  --Ramipril and Lopressor  Seborrheic dermatitis - facial lesions appears consistent --started on triamcinolone cream BID x 1-2 weeks then stop --outpatient PCP or dermatology follow up       Discharge Instructions     Discharge Instructions    Call MD for:  extreme fatigue   Complete by: As directed    Call MD for:  persistant dizziness or light-headedness   Complete by: As directed    Call MD for:  persistant nausea and vomiting   Complete by: As directed    Call MD for:  severe uncontrolled pain   Complete by: As directed    Call MD for:  temperature >100.4   Complete by: As directed    Diet - low sodium heart healthy   Complete by: As directed    Increase activity slowly   Complete by: As directed      Allergies as of 01/22/2021      Reactions   Doxycycline Swelling   Lovastatin Swelling      Medication List    STOP taking these medications   lamoTRIgine 25 MG tablet Commonly known as: LAMICTAL   polyethylene glycol powder 17 GM/SCOOP powder Commonly known as: GLYCOLAX/MIRALAX Replaced by: polyethylene glycol 17 g packet   rivaroxaban 20 MG Tabs tablet Commonly known as: XARELTO     TAKE these medications   acetaminophen 325 MG tablet Commonly known as: TYLENOL Take 2 tablets (650 mg total) by mouth every 6 (six) hours as needed for mild pain, fever or headache.   apixaban 5 MG Tabs tablet Commonly known as: ELIQUIS Take 1 tablet (5 mg total) by mouth 2 (two) times daily.   atorvastatin 20 MG tablet Commonly known as: LIPITOR Take 4 tablets (80 mg total) by mouth at bedtime. What changed:   medication strength  how much to take   bisacodyl 5 MG EC tablet Commonly known as: DULCOLAX Take 1 tablet (5 mg total) by mouth daily as needed for moderate constipation.    bisacodyl 10 MG suppository Commonly known as: DULCOLAX Place 1 suppository (10 mg total) rectally daily as needed for severe constipation.   cholecalciferol 25 MCG (1000 UNIT) tablet Commonly known as: VITAMIN D3 Take 1,000 Units by mouth daily.   cyanocobalamin 1000 MCG tablet Take 1,000 mcg by mouth daily.   feeding supplement Liqd Take 237 mLs by mouth 2 (two) times daily between meals. Start taking on: January 23, 2021   Fish Oil 1000 MG Caps Take 1,000 mg by mouth in the morning and at bedtime.   furosemide 20 MG tablet Commonly known as: LASIX Take 20 mg by mouth 4 (four) times a week.   hydrocerin Crea Apply 1 application topically 2 (two) times daily.   ketoconazole 2 % shampoo Commonly known as: NIZORAL Apply 1 application topically 2 (two) times a week.   levETIRAcetam 1000 MG tablet Commonly known as: KEPPRA 1,000 mg 2 (two) times daily.   metFORMIN 500 MG 24 hr tablet Commonly known as: GLUCOPHAGE-XR Take 1,000 mg by  mouth in the morning and at bedtime.   metoprolol tartrate 25 MG tablet Commonly known as: LOPRESSOR Take 25 mg by mouth 2 (two) times daily.   multivitamin with minerals Tabs tablet Take 1 tablet by mouth daily. Start taking on: January 23, 2021   polyethylene glycol 17 g packet Commonly known as: MIRALAX / GLYCOLAX Take 17 g by mouth daily. Start taking on: January 23, 2021 Replaces: polyethylene glycol powder 17 GM/SCOOP powder   predniSONE 20 MG tablet Commonly known as: DELTASONE Take 2 tablets (40 mg total) by mouth daily with breakfast for 3 days. Start taking on: January 23, 2021   QUEtiapine 50 MG tablet Commonly known as: SEROQUEL Take 1 tablet (50 mg total) by mouth at bedtime. What changed:   medication strength  how much to take  when to take this   QUEtiapine 25 MG tablet Commonly known as: SEROQUEL Take 1 tablet (25 mg total) by mouth 2 (two) times daily as needed (agitation). What changed: You were already  taking a medication with the same name, and this prescription was added. Make sure you understand how and when to take each.   ramipril 10 MG capsule Commonly known as: ALTACE Take 10 mg by mouth 2 (two) times daily.   rivastigmine 1.5 MG capsule Commonly known as: EXELON Take 1 capsule (1.5 mg total) by mouth 2 (two) times daily.   senna-docusate 8.6-50 MG tablet Commonly known as: Senokot-S Take 1 tablet by mouth 2 (two) times daily. What changed: when to take this   triamcinolone cream 0.1 % Commonly known as: KENALOG Apply topically 2 (two) times daily.       Allergies  Allergen Reactions  . Doxycycline Swelling  . Lovastatin Swelling     If you experience worsening of your admission symptoms, develop shortness of breath, life threatening emergency, suicidal or homicidal thoughts you must seek medical attention immediately by calling 911 or calling your MD immediately  if symptoms less severe.    Please note   You were cared for by a hospitalist during your hospital stay. If you have any questions about your discharge medications or the care you received while you were in the hospital after you are discharged, you can call the unit and asked to speak with the hospitalist on call if the hospitalist that took care of you is not available. Once you are discharged, your primary care physician will handle any further medical issues. Please note that NO REFILLS for any discharge medications will be authorized once you are discharged, as it is imperative that you return to your primary care physician (or establish a relationship with a primary care physician if you do not have one) for your aftercare needs so that they can reassess your need for medications and monitor your lab values.   Consultations:  Neurology    Procedures/Studies: DG Knee 1-2 Views Left  Result Date: 01/20/2021 CLINICAL DATA:  Onset left knee pain and swelling this morning. No known injury. EXAM: LEFT  KNEE - 1-2 VIEW COMPARISON:  None. FINDINGS: No fracture or worrisome lesion. Small spurs off the superior and inferior patella noted. The patient has a large joint effusion. Joint spaces are preserved. Mild osteophytosis about the patella noted. 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 Atherosclerosis and vascular clips in the medial soft tissues of the knee are noted. IMPRESSION: Large knee joint effusion.  No acute bony abnormality. Chondrocalcinosis. Mild patellofemoral osteoarthritis. Electronically Signed   By: Inge Rise M.D.   On: 01/20/2021  14:46   MR BRAIN WO CONTRAST  Result Date: 01/16/2021 CLINICAL DATA:  Neuro deficit, acute, stroke suspected. EXAM: MRI HEAD WITHOUT CONTRAST TECHNIQUE: Multiplanar, multiecho pulse sequences of the brain and surrounding structures were obtained without intravenous contrast. COMPARISON:  Prior head CT examinations 01/16/2021 and earlier. FINDINGS: Brain: Examination is intermittently motion degraded. Most notably, there is moderate motion degradation of the coronal diffusion-weighted sequence, moderate motion degradation of the axial T2 weighted sequence, moderate motion degradation of the axial T1 weighted sequence. Moderate to moderately advanced cerebral atrophy. Comparatively mild cerebellar atrophy. Commensurate prominence of the ventricles and sulci. There is a punctate acute cortical infarct within the anterior left frontal lobe (series 5, image 39). Small chronic cortically based infarcts within the bilateral frontal, parietal and occipital lobes. Chronic infarct within the right corona radiata/basal ganglia. Chronic lacunar infarct within the left thalamus. Background moderate multifocal T2/FLAIR hyperintensity within the cerebral white matter is nonspecific, but compatible with chronic small vessel ischemic disease. Small chronic infarcts within the bilateral cerebellar hemispheres No evidence of intracranial mass. No chronic intracranial blood products.  No extra-axial fluid collection. No midline shift. Vascular: Expected proximal arterial flow voids. Skull and upper cervical spine: No focal marrow lesion. Ligamentous hypertrophy/pannus formation posterior to the dens. Sinuses/Orbits: Visualized orbits show no acute finding. Mild right ethmoid sinus mucosal thickening. IMPRESSION: Motion degraded examination, as described. Punctate acute cortical infarct within the anterior left frontal lobe. Small chronic cortically based infarcts within the bilateral frontal, parietal and occipital lobes. Additional chronic infarcts within the right corona radiata/basal ganglia, left thalamus and left cerebellar hemisphere. Background moderate cerebral white matter chronic small vessel ischemic disease. Moderate to moderately advanced cerebral atrophy with comparatively mild cerebellar atrophy. Electronically Signed   By: Kellie Simmering DO   On: 01/16/2021 14:21   DG Chest Portable 1 View  Result Date: 01/16/2021 CLINICAL DATA:  Altered mental status. EXAM: PORTABLE CHEST 1 VIEW COMPARISON:  None. No prior films are available on PACs for comparison. FINDINGS: Status post prior median sternotomy. The heart size is mildly enlarged. The mediastinal contour is otherwise unremarkable. Mild increased central pulmonary vessel caliber is identified. There is no focal pneumonia or pleural effusion. Spinal rods are noted. Degenerative joint changes of bilateral shoulders are identified. IMPRESSION: Mild increased central pulmonary vessel caliber. This can be seen in mild vascular congestion. Electronically Signed   By: Abelardo Diesel M.D.   On: 01/16/2021 11:44   CT HEAD CODE STROKE WO CONTRAST  Addendum Date: 01/16/2021   ADDENDUM REPORT: 01/16/2021 10:51 ADDENDUM: Study discussed by telephone with Dr. Hulan Saas on 01/16/2021 at 1047 hours. Electronically Signed   By: Genevie Ann M.D.   On: 01/16/2021 10:51   Result Date: 01/16/2021 CLINICAL DATA:  Code stroke. 80 year old male last  known normal 0900 hours. Sudden onset altered mental status. EXAM: CT HEAD WITHOUT CONTRAST TECHNIQUE: Contiguous axial images were obtained from the base of the skull through the vertex without intravenous contrast. COMPARISON:  Head CT 11/21/2020 FINDINGS: Brain: Stable cerebral volume. No midline shift, mass effect, or evidence of intracranial mass lesion. Stable ventricle size and configuration. No acute intracranial hemorrhage identified. Chronic encephalomalacia in the right basal ganglia and along the left parieto-occipital sulcus compatible with prior ischemia. Likewise small areas of stable chronic cortical at the superior right parietal and superior right occipital lobes, also the anterior right frontal lobe series 3, image 23. Stable possible small chronic lacunar infarct of the left thalamus. Elsewhere stable patchy white matter hypodensity.  No cortically based acute infarct identified. Vascular: Extensive Calcified atherosclerosis at the skull base. No suspicious intracranial vascular hyperdensity. Skull: No acute osseous abnormality identified. Sinuses/Orbits: Visualized paranasal sinuses and mastoids are stable and well aerated. Other: No acute orbit or scalp soft tissue finding. ASPECTS [facility] Stroke Program Early CT Score) Total score (0-10 with 10 being normal): 10 (chronic encephalomalacia). IMPRESSION: 1. Stable CT appearance of bilateral chronic ischemic disease. 2. No acute cortically based infarct or acute intracranial hemorrhage identified. ASPECTS 10. Electronically Signed: By: Genevie Ann M.D. On: 01/16/2021 10:42       Subjective: Pt seen with wife at bedside after breakfast.  Reports improved knee pain.  Denies other pain or acute complaints.  Feels like he may need to have BM soon.     Discharge Exam: Vitals:   01/22/21 1151 01/22/21 1157  BP: (!) 158/97 (!) 158/97  Pulse: 75 69  Resp: 16   Temp: 98.1 F (36.7 C)   SpO2: 97% 96%   Vitals:   01/22/21 0456 01/22/21 0911  01/22/21 1151 01/22/21 1157  BP: (!) 156/70 (!) 170/78 (!) 158/97 (!) 158/97  Pulse: 67 93 75 69  Resp: 17 20 16    Temp: 97.8 F (36.6 C) 98.6 F (37 C) 98.1 F (36.7 C)   TempSrc: Oral  Oral   SpO2: 93% 96% 97% 96%  Weight:      Height:        General: Pt is alert, awake, not in acute distress Cardiovascular: RRR, S1/S2 +, no rubs, no gallops Respiratory: CTA bilaterally, no wheezing, no rhonchi Abdominal: Soft, NT, ND, bowel sounds + Extremities: left knee swelling and erythema resolved, left knee non-tender today, no edema, no cyanosis    The results of significant diagnostics from this hospitalization (including imaging, microbiology, ancillary and laboratory) are listed below for reference.     Microbiology: Recent Results (from the past 240 hour(s))  Urine Culture     Status: None   Collection Time: 01/16/21 11:05 AM   Specimen: Urine, Random  Result Value Ref Range Status   Specimen Description   Final    URINE, RANDOM Performed at [facility], [address] [place], [place] [address]    Special Requests   Final    NONE Performed at [facility], [address] [place], [place] [address]    Culture   Final    NO GROWTH Performed at [facility], [place] [address] [place], [place] [address]    Report Status 01/17/2021 FINAL  Final  Resp Panel by RT-PCR (Flu A&B, Covid) Nasopharyngeal Swab     Status: None   Collection Time: 01/16/21 11:10 AM   Specimen: Nasopharyngeal Swab; Nasopharyngeal(NP) swabs in vial transport medium  Result Value Ref Range Status   SARS Coronavirus 2 by RT PCR NEGATIVE NEGATIVE Final    Comment: (NOTE) SARS-CoV-2 target nucleic acids are NOT DETECTED.  The SARS-CoV-2 RNA is generally detectable in upper respiratory specimens during the acute phase of infection. The lowest concentration of SARS-CoV-2 viral copies this assay can detect is 138 copies/mL. A negative result does not preclude  SARS-Cov-2 infection and should not be used as the sole basis for treatment or other patient management decisions. A negative result may occur with  improper specimen collection/handling, submission of specimen other than nasopharyngeal swab, presence of viral mutation(s) within the areas targeted by this assay, and inadequate number of viral copies(<138 copies/mL). A negative result must be combined with clinical observations,  patient history, and epidemiological information. The expected result is Negative.  Fact Sheet for Patients:  EntrepreneurPulse.com.au  Fact Sheet for Healthcare Providers:  IncredibleEmployment.be  This test is no t yet approved or cleared by the [place] FDA and  has been authorized for detection and/or diagnosis of SARS-CoV-2 by FDA under an Emergency Use Authorization (EUA). This EUA will remain  in effect (meaning this test can be used) for the duration of the COVID-19 declaration under Section 564(b)(1) of the Act, 21 U.S.C.section 360bbb-3(b)(1), unless the authorization is terminated  or revoked sooner.       Influenza A by PCR NEGATIVE NEGATIVE Final   Influenza B by PCR NEGATIVE NEGATIVE Final    Comment: (NOTE) The Xpert Xpress SARS-CoV-2/FLU/RSV plus assay is intended as an aid in the diagnosis of influenza from Nasopharyngeal swab specimens and should not be used as a sole basis for treatment. Nasal washings and aspirates are unacceptable for Xpert Xpress SARS-CoV-2/FLU/RSV testing.  Fact Sheet for Patients: EntrepreneurPulse.com.au  Fact Sheet for Healthcare Providers: IncredibleEmployment.be  This test is not yet approved or cleared by the [place] FDA and has been authorized for detection and/or diagnosis of SARS-CoV-2 by FDA under an Emergency Use Authorization (EUA). This EUA will remain in effect (meaning this test can be used) for the duration of  the COVID-19 declaration under Section 564(b)(1) of the Act, 21 U.S.C. section 360bbb-3(b)(1), unless the authorization is terminated or revoked.  Performed at [facility], [place], [place] [address]   SARS CORONAVIRUS 2 (TAT 6-24 HRS) Nasopharyngeal Nasopharyngeal Swab     Status: None   Collection Time: 01/20/21  1:30 PM   Specimen: Nasopharyngeal Swab  Result Value Ref Range Status   SARS Coronavirus 2 NEGATIVE NEGATIVE Final    Comment: (NOTE) SARS-CoV-2 target nucleic acids are NOT DETECTED.  The SARS-CoV-2 RNA is generally detectable in upper and lower respiratory specimens during the acute phase of infection. Negative results do not preclude SARS-CoV-2 infection, do not rule out co-infections with other pathogens, and should not be used as the sole basis for treatment or other patient management decisions. Negative results must be combined with clinical observations, patient history, and epidemiological information. The expected result is Negative.  Fact Sheet for Patients: SugarRoll.be  Fact Sheet for Healthcare Providers: https://www.woods-mathews.com/  This test is not yet approved or cleared by the [place] FDA and  has been authorized for detection and/or diagnosis of SARS-CoV-2 by FDA under an Emergency Use Authorization (EUA). This EUA will remain  in effect (meaning this test can be used) for the duration of the COVID-19 declaration under Se ction 564(b)(1) of the Act, 21 U.S.C. section 360bbb-3(b)(1), unless the authorization is terminated or revoked sooner.  Performed at [facility], [place] [address] [place], [place] [address]   Body fluid culture w Gram Stain     Status: None (Preliminary result)   Collection Time: 01/21/21  1:45 PM   Specimen: Synovium; Body Fluid  Result Value Ref Range Status   Specimen Description SYNOVIAL KNEE  Final   Special Requests NONE  Final    Gram Stain   Final    ABUNDANT WBC PRESENT, PREDOMINANTLY PMN NO ORGANISMS SEEN    Culture   Final    NO GROWTH < 12 HOURS Performed at [facility], [address], [place], [place] [address]    Report Status PENDING  Incomplete     Labs: BNP (last 3 results) Recent  Labs    01/16/21 1026  BNP 945.8*   Basic Metabolic Panel: Recent Labs  Lab 01/17/21 0654 01/18/21 0431 01/19/21 0538 01/20/21 0422 01/21/21 0338  NA 138 138 139 138 137  K 4.2 3.4* 3.8 3.8 3.5  CL 102 103 106 106 104  CO2 24 25 25 24 27   GLUCOSE 101* 134* 131* 147* 135*  BUN 11 10 11 14 14   CREATININE 0.89 0.69 0.85 0.88 0.85  CALCIUM 9.3 9.4 9.5 9.6 9.3  MG 1.6* 1.9 1.9 2.0 2.0  PHOS 2.8  --   --   --   --    Liver Function Tests: Recent Labs  Lab 01/16/21 1105 01/17/21 0654  AST 12* 12*  ALT 9 10  ALKPHOS 76 71  BILITOT 1.1 1.8*  PROT 7.8 7.3  ALBUMIN 4.1 3.7   No results for input(s): LIPASE, AMYLASE in the last 168 hours. Recent Labs  Lab 01/16/21 1106  AMMONIA 27   CBC: Recent Labs  Lab 01/16/21 1105 01/17/21 0654 01/18/21 0431 01/19/21 0538 01/20/21 0422 01/21/21 0338  WBC 6.4 6.5 6.2 6.5 6.9 6.8  NEUTROABS 4.7 4.8  --   --   --   --   HGB 12.7* 12.4* 12.3* 12.7* 12.5* 12.0*  HCT 40.1 38.1* 37.1* 37.0* 38.3* 36.2*  MCV 99.8 97.7 96.4 95.1 97.0 96.5  PLT 147* 150 144* 146* 159 158   Cardiac Enzymes: No results for input(s): CKTOTAL, CKMB, CKMBINDEX, TROPONINI in the last 168 hours. BNP: Invalid input(s): POCBNP CBG: Recent Labs  Lab 01/17/21 0434 01/17/21 0755 01/18/21 2049 01/19/21 0759 01/19/21 1225  GLUCAP 89 93 150* 155* 169*   D-Dimer No results for input(s): DDIMER in the last 72 hours. Hgb A1c No results for input(s): HGBA1C in the last 72 hours. Lipid Profile No results for input(s): CHOL, HDL, LDLCALC, TRIG, CHOLHDL, LDLDIRECT in the last 72 hours. Thyroid function studies No results for input(s): TSH, T4TOTAL, T3FREE, THYROIDAB in the last  72 hours.  Invalid input(s): FREET3 Anemia work up No results for input(s): VITAMINB12, FOLATE, FERRITIN, TIBC, IRON, RETICCTPCT in the last 72 hours. Urinalysis    Component Value Date/Time   COLORURINE YELLOW (A) 01/16/2021 1105   APPEARANCEUR CLEAR (A) 01/16/2021 1105   LABSPEC 1.011 01/16/2021 1105   PHURINE 6.0 01/16/2021 1105   GLUCOSEU NEGATIVE 01/16/2021 1105   HGBUR SMALL (A) 01/16/2021 1105   BILIRUBINUR NEGATIVE 01/16/2021 1105   KETONESUR NEGATIVE 01/16/2021 1105   PROTEINUR NEGATIVE 01/16/2021 1105   NITRITE NEGATIVE 01/16/2021 1105   LEUKOCYTESUR TRACE (A) 01/16/2021 1105   Sepsis Labs Invalid input(s): PROCALCITONIN,  WBC,  LACTICIDVEN Microbiology Recent Results (from the past 240 hour(s))  Urine Culture     Status: None   Collection Time: 01/16/21 11:05 AM   Specimen: Urine, Random  Result Value Ref Range Status   Specimen Description   Final    URINE, RANDOM Performed at [facility], [address], [place], [place] [address]    Special Requests   Final    NONE Performed at [facility], [address] [address], [place], [place] [address]    Culture   Final    NO GROWTH Performed at [facility], [place] [address], [place], [place] [address]    Report Status 01/17/2021 FINAL  Final  Resp Panel by RT-PCR (Flu A&B, Covid) Nasopharyngeal Swab     Status: None   Collection Time: 01/16/21 11:10 AM   Specimen: Nasopharyngeal Swab; Nasopharyngeal(NP) swabs in  vial transport medium  Result Value Ref Range Status   SARS Coronavirus 2 by RT PCR NEGATIVE NEGATIVE Final    Comment: (NOTE) SARS-CoV-2 target nucleic acids are NOT DETECTED.  The SARS-CoV-2 RNA is generally detectable in upper respiratory specimens during the acute phase of infection. The lowest concentration of SARS-CoV-2 viral copies this assay can detect is 138 copies/mL. A negative result does not preclude SARS-Cov-2 infection and should not be used as the sole basis for  treatment or other patient management decisions. A negative result may occur with  improper specimen collection/handling, submission of specimen other than nasopharyngeal swab, presence of viral mutation(s) within the areas targeted by this assay, and inadequate number of viral copies(<138 copies/mL). A negative result must be combined with clinical observations, patient history, and epidemiological information. The expected result is Negative.  Fact Sheet for Patients:  EntrepreneurPulse.com.au  Fact Sheet for Healthcare Providers:  IncredibleEmployment.be  This test is no t yet approved or cleared by the [place] FDA and  has been authorized for detection and/or diagnosis of SARS-CoV-2 by FDA under an Emergency Use Authorization (EUA). This EUA will remain  in effect (meaning this test can be used) for the duration of the COVID-19 declaration under Section 564(b)(1) of the Act, 21 U.S.C.section 360bbb-3(b)(1), unless the authorization is terminated  or revoked sooner.       Influenza A by PCR NEGATIVE NEGATIVE Final   Influenza B by PCR NEGATIVE NEGATIVE Final    Comment: (NOTE) The Xpert Xpress SARS-CoV-2/FLU/RSV plus assay is intended as an aid in the diagnosis of influenza from Nasopharyngeal swab specimens and should not be used as a sole basis for treatment. Nasal washings and aspirates are unacceptable for Xpert Xpress SARS-CoV-2/FLU/RSV testing.  Fact Sheet for Patients: EntrepreneurPulse.com.au  Fact Sheet for Healthcare Providers: IncredibleEmployment.be  This test is not yet approved or cleared by the [place] FDA and has been authorized for detection and/or diagnosis of SARS-CoV-2 by FDA under an Emergency Use Authorization (EUA). This EUA will remain in effect (meaning this test can be used) for the duration of the COVID-19 declaration under Section 564(b)(1) of the Act, 21  U.S.C. section 360bbb-3(b)(1), unless the authorization is terminated or revoked.  Performed at [facility], [place], [place] [address]   SARS CORONAVIRUS 2 (TAT 6-24 HRS) Nasopharyngeal Nasopharyngeal Swab     Status: None   Collection Time: 01/20/21  1:30 PM   Specimen: Nasopharyngeal Swab  Result Value Ref Range Status   SARS Coronavirus 2 NEGATIVE NEGATIVE Final    Comment: (NOTE) SARS-CoV-2 target nucleic acids are NOT DETECTED.  The SARS-CoV-2 RNA is generally detectable in upper and lower respiratory specimens during the acute phase of infection. Negative results do not preclude SARS-CoV-2 infection, do not rule out co-infections with other pathogens, and should not be used as the sole basis for treatment or other patient management decisions. Negative results must be combined with clinical observations, patient history, and epidemiological information. The expected result is Negative.  Fact Sheet for Patients: SugarRoll.be  Fact Sheet for Healthcare Providers: https://www.woods-mathews.com/  This test is not yet approved or cleared by the [place] FDA and  has been authorized for detection and/or diagnosis of SARS-CoV-2 by FDA under an Emergency Use Authorization (EUA). This EUA will remain  in effect (meaning this test can be used) for the duration of the COVID-19 declaration under Se ction 564(b)(1) of the Act, 21 U.S.C. section 360bbb-3(b)(1), unless the authorization is terminated or  revoked sooner.  Performed at [facility], [place] [address] [place], [place] [address]   Body fluid culture w Gram Stain     Status: None (Preliminary result)   Collection Time: 01/21/21  1:45 PM   Specimen: Synovium; Body Fluid  Result Value Ref Range Status   Specimen Description SYNOVIAL KNEE  Final   Special Requests NONE  Final   Gram Stain   Final    ABUNDANT WBC PRESENT, PREDOMINANTLY PMN NO  ORGANISMS SEEN    Culture   Final    NO GROWTH < 12 HOURS Performed at [facility], [address], [place], [place] [address]    Report Status PENDING  Incomplete     Time coordinating discharge: Over 30 minutes  SIGNED:   Ezekiel Slocumb, DO [facility] 01/22/2021, 2:30 PM   If 7PM-7AM, please contact night-coverage [email or web address]

## 2021-01-22 NOTE — Progress Notes (Signed)
Subjective: The patient notes that his knee symptoms are much improved this morning.  He denies any pain in the knee, and feels that he is able to move his knee more freely.   Objective: Vital signs in last 24 hours: Temp:  [97.4 F (36.3 C)-98.9 F (37.2 C)] 97.8 F (36.6 C) (04/22 0456) Pulse Rate:  [54-81] 67 (04/22 0456) Resp:  [17-20] 17 (04/22 0456) BP: (129-156)/(59-86) 156/70 (04/22 0456) SpO2:  [93 %-98 %] 93 % (04/22 0456)  Intake/Output from previous day: 04/21 0701 - 04/22 0700 In: -  Out: 1275 [Urine:1275] Intake/Output this shift: No intake/output data recorded.  Recent Labs    01/20/21 0422 01/21/21 0338  HGB 12.5* 12.0*   Recent Labs    01/20/21 0422 01/21/21 0338  WBC 6.9 6.8  RBC 3.95* 3.75*  HCT 38.3* 36.2*  PLT 159 158   Recent Labs    01/20/21 0422 01/21/21 0338  NA 138 137  K 3.8 3.5  CL 106 104  CO2 24 27  BUN 14 14  CREATININE 0.88 0.85  GLUCOSE 147* 135*  CALCIUM 9.6 9.3   No results for input(s): LABPT, INR in the last 72 hours.  Physical Exam: The patient's orthopedic examination is limited to the left knee and lower extremity.  Skin inspection around the left knee is notable for minimal swelling, as well as for a small residual effusion, but otherwise is unremarkable.  No erythema, ecchymosis, abrasions, or other skin abnormalities are identified.  He experiences at most minimal tenderness to palpation around the knee.  He can perform an active straight leg raise without any discomfort and is able to flex his knee to 95 degrees without any discomfort.  He is neurovascularly intact to the left lower extremity and foot.  Lab results: The patient's left knee aspirate showed 3600 white cells, but no crystals.  There is gram stain showed white blood cells but no organisms were identified.  These findings are consistent with a nonseptic inflammatory etiology of his knee effusion.  Assessment: Acute left knee effusion, presently improved  symptomatically.  Plan: The patient may continue to be mobilized with physical therapy, weightbearing as tolerated on the left lower extremity.  Thank you for asking me to participate in care of this most pleasant yet unfortunate man.  I am going to sign off on his care for now.  He may follow-up with [place] in the office on an as necessary basis.  If you have further need for orthopedic management during his hospital stay, please reconsult me.   Excell Seltzer Dakai Braithwaite 01/22/2021, 8:03 AM

## 2021-01-22 NOTE — TOC Transition Note (Signed)
Transition of Care [facility]) - CM/SW Discharge Note   Patient Details  Name: Ian King MRN: [id number] Date of Birth: Aug 30, 1941  [facility] CM/SW Contact:  Allayne Butcher, RN Phone Number: 01/22/2021, 3:18 PM   Clinical Narrative:    Patient medically cleared for discharge to [facility].  Patient is going to room 704, bedside RN calling report.  [facility] has been arranged and patient is next in line for pick up.  Patient's wife is updated on discharge plan for today.    Final next level of care: Skilled Nursing Facility Barriers to Discharge: Barriers Resolved   Patient Goals and CMS Choice Patient states their goals for this hospitalization and ongoing recovery are:: SNF rehab CMS Medicare.gov Compare Post Acute Care list provided to:: Patient Represenative (must comment) Choice offered to / list presented to : Spouse  Discharge Placement              Patient chooses bed at: [facility] [place] Patient to be transferred to facility by: [facility] Name of family member notified: Natalia Leatherwood Patient and family notified of of transfer: 01/22/21  Discharge Plan and Services                                     Social Determinants of Health (SDOH) Interventions     Readmission Risk Interventions No flowsheet data found.

## 2021-01-25 LAB — BODY FLUID CULTURE W GRAM STAIN: Culture: NO GROWTH

## 2021-01-27 LAB — CULTURE, BLOOD (ROUTINE X 2)
Culture: NO GROWTH
Culture: NO GROWTH
Special Requests: ADEQUATE
Special Requests: ADEQUATE

## 2021-03-18 ENCOUNTER — Ambulatory Visit (INDEPENDENT_AMBULATORY_CARE_PROVIDER_SITE_OTHER): Payer: Medicare PPO | Admitting: Vascular Surgery

## 2021-03-18 ENCOUNTER — Encounter (INDEPENDENT_AMBULATORY_CARE_PROVIDER_SITE_OTHER): Payer: Medicare PPO

## 2021-04-13 ENCOUNTER — Emergency Department: Payer: Medicare PPO

## 2021-04-13 ENCOUNTER — Other Ambulatory Visit: Payer: Self-pay

## 2021-04-13 DIAGNOSIS — Z833 Family history of diabetes mellitus: Secondary | ICD-10-CM

## 2021-04-13 DIAGNOSIS — I700 Atherosclerosis of aorta: Secondary | ICD-10-CM | POA: Diagnosis present

## 2021-04-13 DIAGNOSIS — Z7901 Long term (current) use of anticoagulants: Secondary | ICD-10-CM

## 2021-04-13 DIAGNOSIS — Z951 Presence of aortocoronary bypass graft: Secondary | ICD-10-CM

## 2021-04-13 DIAGNOSIS — F32A Depression, unspecified: Secondary | ICD-10-CM | POA: Diagnosis present

## 2021-04-13 DIAGNOSIS — F05 Delirium due to known physiological condition: Secondary | ICD-10-CM | POA: Diagnosis not present

## 2021-04-13 DIAGNOSIS — Z888 Allergy status to other drugs, medicaments and biological substances status: Secondary | ICD-10-CM

## 2021-04-13 DIAGNOSIS — Z87891 Personal history of nicotine dependence: Secondary | ICD-10-CM

## 2021-04-13 DIAGNOSIS — E785 Hyperlipidemia, unspecified: Secondary | ICD-10-CM | POA: Diagnosis present

## 2021-04-13 DIAGNOSIS — Z66 Do not resuscitate: Secondary | ICD-10-CM | POA: Diagnosis present

## 2021-04-13 DIAGNOSIS — I2510 Atherosclerotic heart disease of native coronary artery without angina pectoris: Secondary | ICD-10-CM | POA: Diagnosis present

## 2021-04-13 DIAGNOSIS — R443 Hallucinations, unspecified: Secondary | ICD-10-CM | POA: Diagnosis present

## 2021-04-13 DIAGNOSIS — S2241XA Multiple fractures of ribs, right side, initial encounter for closed fracture: Secondary | ICD-10-CM | POA: Diagnosis not present

## 2021-04-13 DIAGNOSIS — Y92009 Unspecified place in unspecified non-institutional (private) residence as the place of occurrence of the external cause: Secondary | ICD-10-CM

## 2021-04-13 DIAGNOSIS — I493 Ventricular premature depolarization: Secondary | ICD-10-CM | POA: Diagnosis present

## 2021-04-13 DIAGNOSIS — I110 Hypertensive heart disease with heart failure: Secondary | ICD-10-CM | POA: Diagnosis present

## 2021-04-13 DIAGNOSIS — K5900 Constipation, unspecified: Secondary | ICD-10-CM | POA: Diagnosis present

## 2021-04-13 DIAGNOSIS — Z8673 Personal history of transient ischemic attack (TIA), and cerebral infarction without residual deficits: Secondary | ICD-10-CM

## 2021-04-13 DIAGNOSIS — Z8249 Family history of ischemic heart disease and other diseases of the circulatory system: Secondary | ICD-10-CM

## 2021-04-13 DIAGNOSIS — Z96651 Presence of right artificial knee joint: Secondary | ICD-10-CM | POA: Diagnosis present

## 2021-04-13 DIAGNOSIS — I513 Intracardiac thrombosis, not elsewhere classified: Secondary | ICD-10-CM | POA: Diagnosis present

## 2021-04-13 DIAGNOSIS — Z96649 Presence of unspecified artificial hip joint: Secondary | ICD-10-CM | POA: Diagnosis present

## 2021-04-13 DIAGNOSIS — Z8616 Personal history of COVID-19: Secondary | ICD-10-CM

## 2021-04-13 DIAGNOSIS — Z79899 Other long term (current) drug therapy: Secondary | ICD-10-CM

## 2021-04-13 DIAGNOSIS — I482 Chronic atrial fibrillation, unspecified: Secondary | ICD-10-CM | POA: Diagnosis present

## 2021-04-13 DIAGNOSIS — W1830XA Fall on same level, unspecified, initial encounter: Secondary | ICD-10-CM | POA: Diagnosis present

## 2021-04-13 DIAGNOSIS — N289 Disorder of kidney and ureter, unspecified: Secondary | ICD-10-CM | POA: Diagnosis present

## 2021-04-13 DIAGNOSIS — I5032 Chronic diastolic (congestive) heart failure: Secondary | ICD-10-CM | POA: Diagnosis present

## 2021-04-13 DIAGNOSIS — R911 Solitary pulmonary nodule: Secondary | ICD-10-CM | POA: Diagnosis present

## 2021-04-13 DIAGNOSIS — Z7984 Long term (current) use of oral hypoglycemic drugs: Secondary | ICD-10-CM

## 2021-04-13 DIAGNOSIS — E119 Type 2 diabetes mellitus without complications: Secondary | ICD-10-CM | POA: Diagnosis present

## 2021-04-13 DIAGNOSIS — G40909 Epilepsy, unspecified, not intractable, without status epilepticus: Secondary | ICD-10-CM | POA: Diagnosis present

## 2021-04-13 DIAGNOSIS — F0390 Unspecified dementia without behavioral disturbance: Secondary | ICD-10-CM | POA: Diagnosis present

## 2021-04-13 LAB — BASIC METABOLIC PANEL
Anion gap: 8 (ref 5–15)
BUN: 11 mg/dL (ref 8–23)
CO2: 29 mmol/L (ref 22–32)
Calcium: 10.4 mg/dL — ABNORMAL HIGH (ref 8.9–10.3)
Chloride: 103 mmol/L (ref 98–111)
Creatinine, Ser: 0.96 mg/dL (ref 0.61–1.24)
GFR, Estimated: >60 mL/min (ref >60)
Glucose, Bld: 120 mg/dL — ABNORMAL HIGH (ref 70–99)
Potassium: 3.9 mmol/L (ref 3.5–5.1)
Sodium: 140 mmol/L (ref 135–145)

## 2021-04-13 LAB — CBC
HCT: 36.8 % — ABNORMAL LOW (ref 39.0–52.0)
Hemoglobin: 12 g/dL — ABNORMAL LOW (ref 13.0–17.0)
MCH: 31.8 pg (ref 26.0–34.0)
MCHC: 32.6 g/dL (ref 30.0–36.0)
MCV: 97.6 fL (ref 80.0–100.0)
Platelets: 142 10*3/uL — ABNORMAL LOW (ref 150–400)
RBC: 3.77 MIL/uL — ABNORMAL LOW (ref 4.22–5.81)
RDW: 14.3 % (ref 11.5–15.5)
WBC: 5.8 10*3/uL (ref 4.0–10.5)
nRBC: 0 % (ref 0.0–0.2)

## 2021-04-13 NOTE — ED Triage Notes (Signed)
Pt arrives from home via [facility], Per report, Pt got up without using walker, fell, heard by his wife. No LOC, c/o pain in the right flank area. PVC's, afib 49-110 on EKG. Hx of dementia, CABG, 144/107, sats 96%, cbg 138.

## 2021-04-13 NOTE — ED Triage Notes (Signed)
Pt says he was hurting in his right hip and back, but it is feels better. Denies neck pain, does have a hx of neck surgery.  Spoke with pt wife, pt woke up and was agitated. Pt had gotten out of his chair with assistance, was walking down the hall and fell. He may have hit his head on the leather couch, but she is unsure. Denied he had LOC. He was immediately c/o pain in his back. She was unable to help him to get up. Hx of back surgery, left hip replacement and right knee. He is on Eliquis.

## 2021-04-14 ENCOUNTER — Emergency Department: Payer: Medicare PPO

## 2021-04-14 ENCOUNTER — Inpatient Hospital Stay
Admission: EM | Admit: 2021-04-14 | Discharge: 2021-04-20 | DRG: 184 | Disposition: A | Discharge status: Home Health | Payer: Medicare PPO | Attending: Internal Medicine | Admitting: Internal Medicine

## 2021-04-14 ENCOUNTER — Encounter: Payer: Self-pay | Admitting: Internal Medicine

## 2021-04-14 DIAGNOSIS — R0602 Shortness of breath: Secondary | ICD-10-CM

## 2021-04-14 DIAGNOSIS — G40909 Epilepsy, unspecified, not intractable, without status epilepticus: Secondary | ICD-10-CM

## 2021-04-14 DIAGNOSIS — E119 Type 2 diabetes mellitus without complications: Secondary | ICD-10-CM

## 2021-04-14 DIAGNOSIS — S2231XA Fracture of one rib, right side, initial encounter for closed fracture: Secondary | ICD-10-CM | POA: Diagnosis present

## 2021-04-14 DIAGNOSIS — F0390 Unspecified dementia without behavioral disturbance: Secondary | ICD-10-CM | POA: Diagnosis present

## 2021-04-14 DIAGNOSIS — I482 Chronic atrial fibrillation, unspecified: Secondary | ICD-10-CM | POA: Diagnosis present

## 2021-04-14 DIAGNOSIS — W19XXXA Unspecified fall, initial encounter: Secondary | ICD-10-CM

## 2021-04-14 DIAGNOSIS — K5900 Constipation, unspecified: Secondary | ICD-10-CM

## 2021-04-14 DIAGNOSIS — E785 Hyperlipidemia, unspecified: Secondary | ICD-10-CM | POA: Diagnosis present

## 2021-04-14 DIAGNOSIS — T1490XA Injury, unspecified, initial encounter: Secondary | ICD-10-CM | POA: Diagnosis present

## 2021-04-14 DIAGNOSIS — I10 Essential (primary) hypertension: Secondary | ICD-10-CM | POA: Diagnosis present

## 2021-04-14 DIAGNOSIS — S2241XA Multiple fractures of ribs, right side, initial encounter for closed fracture: Secondary | ICD-10-CM | POA: Diagnosis not present

## 2021-04-14 DIAGNOSIS — I2510 Atherosclerotic heart disease of native coronary artery without angina pectoris: Secondary | ICD-10-CM | POA: Diagnosis present

## 2021-04-14 DIAGNOSIS — Y92009 Unspecified place in unspecified non-institutional (private) residence as the place of occurrence of the external cause: Secondary | ICD-10-CM

## 2021-04-14 DIAGNOSIS — I639 Cerebral infarction, unspecified: Secondary | ICD-10-CM | POA: Diagnosis present

## 2021-04-14 DIAGNOSIS — N289 Disorder of kidney and ureter, unspecified: Secondary | ICD-10-CM

## 2021-04-14 LAB — URINALYSIS, COMPLETE (UACMP) WITH MICROSCOPIC
Bacteria, UA: NONE SEEN
Bilirubin Urine: NEGATIVE
Glucose, UA: 50 mg/dL — AB
Hgb urine dipstick: NEGATIVE
Ketones, ur: 5 mg/dL — AB
Leukocytes,Ua: NEGATIVE
Nitrite: NEGATIVE
Protein, ur: NEGATIVE mg/dL
Specific Gravity, Urine: >1.046 — ABNORMAL HIGH (ref 1.005–1.030)
pH: 7 (ref 5.0–8.0)

## 2021-04-14 LAB — GLUCOSE, CAPILLARY
Glucose-Capillary: 170 mg/dL — ABNORMAL HIGH (ref 70–99)
Glucose-Capillary: 165 mg/dL — ABNORMAL HIGH (ref 70–99)
Glucose-Capillary: 213 mg/dL — ABNORMAL HIGH (ref 70–99)

## 2021-04-14 LAB — PROTIME-INR
INR: 1.2 (ref 0.8–1.2)
Prothrombin Time: 15.6 seconds — ABNORMAL HIGH (ref 11.4–15.2)

## 2021-04-14 LAB — APTT: aPTT: 29 seconds (ref 24–36)

## 2021-04-14 MED ORDER — VITAMIN D 25 MCG (1000 UNIT) PO TABS
1000.0000 [IU] | ORAL_TABLET | Freq: Every day | ORAL | Status: DC
  Administered 2021-04-14 – 2021-04-20 (×7): 1000 [IU] via ORAL
  Filled 2021-04-14 (×7): qty 1

## 2021-04-14 MED ORDER — ACETAMINOPHEN 325 MG PO TABS
650.0000 mg | ORAL_TABLET | Freq: Four times a day (QID) | ORAL | Status: DC | PRN
  Administered 2021-04-14: 650 mg via ORAL
  Filled 2021-04-14: qty 2

## 2021-04-14 MED ORDER — HYDRALAZINE HCL 20 MG/ML IJ SOLN: 5.0000 mg | INTRAMUSCULAR | Status: DC | PRN

## 2021-04-14 MED ORDER — SENNOSIDES-DOCUSATE SODIUM 8.6-50 MG PO TABS
1.0000 | ORAL_TABLET | Freq: Every evening | ORAL | Status: DC | PRN
  Administered 2021-04-16: 1 via ORAL
  Filled 2021-04-14: qty 1

## 2021-04-14 MED ORDER — LORAZEPAM 2 MG/ML IJ SOLN: 1.0000 mg | INTRAMUSCULAR | Status: DC | PRN

## 2021-04-14 MED ORDER — APIXABAN 5 MG PO TABS: 5.0000 mg | ORAL_TABLET | Freq: Two times a day (BID) | ORAL | Status: DC

## 2021-04-14 MED ORDER — LEVETIRACETAM 500 MG PO TABS: 1000.0000 mg | ORAL_TABLET | Freq: Two times a day (BID) | ORAL | Status: DC

## 2021-04-14 MED ORDER — FENTANYL CITRATE (PF) 100 MCG/2ML IJ SOLN
50.0000 ug | Freq: Once | INTRAMUSCULAR | Status: AC
  Administered 2021-04-14: 50 ug via INTRAVENOUS
  Filled 2021-04-14: qty 2

## 2021-04-14 MED ORDER — LEVETIRACETAM 500 MG PO TABS
1000.0000 mg | ORAL_TABLET | Freq: Two times a day (BID) | ORAL | Status: DC
  Administered 2021-04-14 – 2021-04-20 (×13): 1000 mg via ORAL
  Filled 2021-04-14 (×15): qty 2

## 2021-04-14 MED ORDER — FUROSEMIDE 20 MG PO TABS
20.0000 mg | ORAL_TABLET | ORAL | Status: DC
  Administered 2021-04-15: 20 mg via ORAL
  Filled 2021-04-14: qty 1

## 2021-04-14 MED ORDER — RAMIPRIL 10 MG PO CAPS
10.0000 mg | ORAL_CAPSULE | Freq: Two times a day (BID) | ORAL | Status: DC
  Administered 2021-04-14 – 2021-04-20 (×13): 10 mg via ORAL
  Filled 2021-04-14 (×14): qty 1

## 2021-04-14 MED ORDER — APIXABAN 5 MG PO TABS
5.0000 mg | ORAL_TABLET | Freq: Two times a day (BID) | ORAL | Status: DC
  Administered 2021-04-14 – 2021-04-20 (×12): 5 mg via ORAL
  Filled 2021-04-14 (×13): qty 1
  Filled 2021-04-14: qty 2
  Filled 2021-04-14: qty 1

## 2021-04-14 MED ORDER — QUETIAPINE FUMARATE 25 MG PO TABS
25.0000 mg | ORAL_TABLET | Freq: Two times a day (BID) | ORAL | Status: DC | PRN
  Administered 2021-04-14 – 2021-04-20 (×9): 25 mg via ORAL
  Filled 2021-04-14 (×9): qty 1

## 2021-04-14 MED ORDER — LIDOCAINE 5 % EX PTCH
1.0000 | MEDICATED_PATCH | CUTANEOUS | Status: DC
  Administered 2021-04-14 – 2021-04-20 (×7): 1 via TRANSDERMAL
  Filled 2021-04-14 (×9): qty 1

## 2021-04-14 MED ORDER — IOHEXOL 300 MG/ML  SOLN
75.0000 mL | Freq: Once | INTRAMUSCULAR | Status: AC | PRN
  Administered 2021-04-14: 75 mL via INTRAVENOUS

## 2021-04-14 MED ORDER — QUETIAPINE FUMARATE 25 MG PO TABS: 50.0000 mg | ORAL_TABLET | Freq: Every day | ORAL | Status: DC

## 2021-04-14 MED ORDER — METOPROLOL TARTRATE 25 MG PO TABS
12.5000 mg | ORAL_TABLET | Freq: Two times a day (BID) | ORAL | Status: DC
  Administered 2021-04-14 (×2): 12.5 mg via ORAL
  Filled 2021-04-14 (×2): qty 1

## 2021-04-14 MED ORDER — HYDROCERIN EX CREA
1.0000 "application " | TOPICAL_CREAM | Freq: Two times a day (BID) | CUTANEOUS | Status: DC
  Administered 2021-04-14 – 2021-04-20 (×9): 1 via TOPICAL
  Filled 2021-04-14 (×3): qty 113

## 2021-04-14 MED ORDER — METOPROLOL TARTRATE 25 MG PO TABS
12.5000 mg | ORAL_TABLET | Freq: Once | ORAL | Status: AC
  Administered 2021-04-14: 12.5 mg via ORAL
  Filled 2021-04-14: qty 1

## 2021-04-14 MED ORDER — RAMIPRIL 10 MG PO CAPS
10.0000 mg | ORAL_CAPSULE | Freq: Once | ORAL | Status: AC
  Administered 2021-04-14: 10 mg via ORAL
  Filled 2021-04-14: qty 1

## 2021-04-14 MED ORDER — RIVASTIGMINE TARTRATE 1.5 MG PO CAPS
1.5000 mg | ORAL_CAPSULE | Freq: Two times a day (BID) | ORAL | Status: DC
  Administered 2021-04-14 – 2021-04-20 (×13): 1.5 mg via ORAL
  Filled 2021-04-14 (×15): qty 1

## 2021-04-14 MED ORDER — ATORVASTATIN CALCIUM 20 MG PO TABS
80.0000 mg | ORAL_TABLET | ORAL | Status: DC
  Administered 2021-04-14 – 2021-04-20 (×6): 80 mg via ORAL
  Filled 2021-04-14 (×6): qty 4

## 2021-04-14 MED ORDER — SENNOSIDES-DOCUSATE SODIUM 8.6-50 MG PO TABS: 1.0000 | ORAL_TABLET | Freq: Two times a day (BID) | ORAL | Status: DC | PRN

## 2021-04-14 MED ORDER — INSULIN ASPART 100 UNIT/ML IJ SOLN
0.0000 [IU] | Freq: Three times a day (TID) | INTRAMUSCULAR | Status: DC
  Administered 2021-04-14 – 2021-04-18 (×8): 1 [IU] via SUBCUTANEOUS
  Administered 2021-04-18: 2 [IU] via SUBCUTANEOUS
  Administered 2021-04-18 – 2021-04-20 (×4): 1 [IU] via SUBCUTANEOUS
  Administered 2021-04-20: 2 [IU] via SUBCUTANEOUS
  Filled 2021-04-14 (×15): qty 1

## 2021-04-14 MED ORDER — RIVASTIGMINE TARTRATE 1.5 MG PO CAPS: 1.5000 mg | ORAL_CAPSULE | Freq: Two times a day (BID) | ORAL | Status: DC

## 2021-04-14 MED ORDER — QUETIAPINE FUMARATE 25 MG PO TABS
50.0000 mg | ORAL_TABLET | Freq: Every day | ORAL | Status: DC
  Administered 2021-04-14 – 2021-04-20 (×5): 50 mg via ORAL
  Filled 2021-04-14 (×8): qty 2

## 2021-04-14 MED ORDER — TRIAMCINOLONE ACETONIDE 0.1 % EX CREA
1.0000 "application " | TOPICAL_CREAM | Freq: Two times a day (BID) | CUTANEOUS | Status: DC
  Administered 2021-04-15 – 2021-04-16 (×3): 1 via TOPICAL
  Filled 2021-04-14: qty 15

## 2021-04-14 MED ORDER — KETOROLAC TROMETHAMINE 15 MG/ML IJ SOLN
15.0000 mg | Freq: Four times a day (QID) | INTRAMUSCULAR | Status: DC | PRN
  Filled 2021-04-14: qty 1

## 2021-04-14 MED ORDER — METHOCARBAMOL 500 MG PO TABS
500.0000 mg | ORAL_TABLET | Freq: Three times a day (TID) | ORAL | Status: DC | PRN
  Filled 2021-04-14: qty 1

## 2021-04-14 MED ORDER — ACETAMINOPHEN 325 MG PO TABS
650.0000 mg | ORAL_TABLET | Freq: Four times a day (QID) | ORAL | Status: DC
  Administered 2021-04-14 – 2021-04-17 (×9): 650 mg via ORAL
  Filled 2021-04-14 (×11): qty 2

## 2021-04-14 MED ORDER — OXYCODONE-ACETAMINOPHEN 5-325 MG PO TABS: 1.0000 | ORAL_TABLET | ORAL | Status: DC | PRN

## 2021-04-14 MED ORDER — MORPHINE SULFATE (PF) 2 MG/ML IV SOLN: 0.5000 mg | INTRAVENOUS | Status: DC | PRN

## 2021-04-14 MED ORDER — ADULT MULTIVITAMIN W/MINERALS CH
1.0000 | ORAL_TABLET | Freq: Every day | ORAL | Status: DC
  Administered 2021-04-15 – 2021-04-20 (×6): 1 via ORAL
  Filled 2021-04-14 (×6): qty 1

## 2021-04-14 MED ORDER — ONDANSETRON HCL 4 MG/2ML IJ SOLN: 4.0000 mg | Freq: Three times a day (TID) | INTRAMUSCULAR | Status: DC | PRN

## 2021-04-14 MED ORDER — OMEGA-3-ACID ETHYL ESTERS 1 G PO CAPS
1.0000 g | ORAL_CAPSULE | Freq: Every day | ORAL | Status: DC
  Administered 2021-04-14: 1 g via ORAL
  Filled 2021-04-14 (×3): qty 1

## 2021-04-14 MED ORDER — INSULIN ASPART 100 UNIT/ML IJ SOLN
0.0000 [IU] | Freq: Every day | INTRAMUSCULAR | Status: DC
  Administered 2021-04-14: 2 [IU] via SUBCUTANEOUS
  Filled 2021-04-14: qty 1

## 2021-04-14 MED ORDER — ENSURE ENLIVE PO LIQD
237.0000 mL | Freq: Two times a day (BID) | ORAL | Status: DC
  Administered 2021-04-14 – 2021-04-20 (×13): 237 mL via ORAL

## 2021-04-14 MED ORDER — ACETAMINOPHEN 500 MG PO TABS
1000.0000 mg | ORAL_TABLET | Freq: Once | ORAL | Status: AC
  Administered 2021-04-14: 1000 mg via ORAL
  Filled 2021-04-14: qty 2

## 2021-04-14 MED ORDER — BISACODYL 10 MG RE SUPP: 10.0000 mg | Freq: Every day | RECTAL | Status: DC | PRN

## 2021-04-14 NOTE — Consult Note (Signed)
Cardiology Consultation Note    Patient ID: Ian King, MRN: [id number], DOB/AGE: 10-24-1940 80 y.o. Admit date: 04/14/2021   Date of Consult: 04/14/2021 Primary Physician: Marguarite Arbour, MD Primary Cardiologist: Bernette Redbird MD/Edana Neysa Bonito, NP [facility]  Chief Complaint: fall Reason for Consultation: abnormal chest ct Requesting MD: Dr. Clyde Lundborg  HPI: Ian King is a 80 y.o. male with history of coronary artery disease status post coronary artery bypass grafting x4 in 2005, dementia, history of postoperative atrial flutter status post DCCV x2 now in chronic A. fib rate controlled and anticoagulated with Eliquis history of diabetes, hypertension and seizure disorder.  He was hospitalized at [facility] in the spring with a CVA.  He was last seen at [facility] in April 2022.  Rate is controlled with metoprolol tartrate 25 mg twice daily.  He is on a statin and has had no angina.  He also has a history of hypertension treated with ramipril 10 mg twice daily and the metoprolol.  Patient apparently had a fall while his wife was not watching him.  He was trying to get up without assistance and fell from a standing position.  He usually requires assistance to get up.  There is no evidence that he had syncope.  He complained of right flank pain on presentation to the emergency room.  He is compliant with his apixaban at 5 mg twice daily as well as atorvastatin 80 mg daily, Lasix.  Chest abdomen pelvis CT revealed multiple acute right-sided rib fractures which were nondisplaced.  No pneumothorax.  Cardiomegaly with biatrial enlargement.  Felt to have a filling defect at the tip of the left atrial appendage.  Again it is noted the patient is on full dose Eliquis.  EKG shows atrial fibrillation with controlled ventricular response.  Laboratories revealed normal potassium.  Creatinine is normal.  Past Medical History:  Diagnosis Date   A-fib (HCC)    Diabetes mellitus without complication (HCC)    Hypertension        Surgical History:  Past Surgical History:  Procedure Laterality Date   arm surgery     BACK SURGERY     JOINT REPLACEMENT     hip   JOINT REPLACEMENT Right    knee   SPINE SURGERY       Home Meds: Prior to Admission medications   Medication Sig Start Date End Date Taking? Authorizing Provider  apixaban (ELIQUIS) 5 MG TABS tablet Take 1 tablet (5 mg total) by mouth 2 (two) times daily. 01/22/21  Yes Esaw Grandchild A, DO  cholecalciferol (VITAMIN D3) 25 MCG (1000 UNIT) tablet Take 1,000 Units by mouth daily.   Yes [provider]  levETIRAcetam (KEPPRA) 1000 MG tablet 1,000 mg 2 (two) times daily. 03/10/20  Yes [provider]  metFORMIN (GLUCOPHAGE) 1000 MG tablet Take 1,000 mg by mouth 2 (two) times daily. 03/15/21  Yes [provider]  metoprolol tartrate (LOPRESSOR) 25 MG tablet Take 25 mg by mouth 2 (two) times daily.   Yes [provider]  Omega-3 Fatty Acids (FISH OIL) 1000 MG CAPS Take 1,000 mg by mouth in the morning and at bedtime.   Yes [provider]  QUEtiapine (SEROQUEL) 25 MG tablet Take 1 tablet (25 mg total) by mouth 2 (two) times daily as needed (agitation). 01/22/21  Yes Esaw Grandchild A, DO  ramipril (ALTACE) 10 MG capsule Take 10 mg by mouth 2 (two) times daily.   Yes [provider]  rivastigmine (EXELON) 1.5  MG capsule Take 1 capsule (1.5 mg total) by mouth 2 (two) times daily. 01/22/21  Yes Pennie Banter, DO  acetaminophen (TYLENOL) 325 MG tablet Take 2 tablets (650 mg total) by mouth every 6 (six) hours as needed for mild pain, fever or headache. 01/22/21   Esaw Grandchild A, DO  atorvastatin (LIPITOR) 80 MG tablet Take 80 mg by mouth daily. 03/15/21   [provider]  bisacodyl (DULCOLAX) 10 MG suppository Place 1 suppository (10 mg total) rectally daily as needed for severe constipation. 01/22/21   Pennie Banter, DO  bisacodyl (DULCOLAX) 5 MG EC tablet Take 1 tablet (5 mg total) by mouth daily as  needed for moderate constipation. 01/22/21   Pennie Banter, DO  feeding supplement (ENSURE ENLIVE / ENSURE PLUS) LIQD Take 237 mLs by mouth 2 (two) times daily between meals. 01/23/21   Pennie Banter, DO  furosemide (LASIX) 20 MG tablet Take 20 mg by mouth 4 (four) times a week.     [provider]  hydrocerin (EUCERIN) CREA Apply 1 application topically 2 (two) times daily. 01/22/21   Pennie Banter, DO  ketoconazole (NIZORAL) 2 % shampoo Apply 1 application topically 2 (two) times a week.    [provider]  Multiple Vitamin (MULTIVITAMIN WITH MINERALS) TABS tablet Take 1 tablet by mouth daily. 01/23/21   Pennie Banter, DO  polyethylene glycol (MIRALAX / GLYCOLAX) 17 g packet Take 17 g by mouth daily. 01/23/21   Pennie Banter, DO  QUEtiapine (SEROQUEL) 50 MG tablet Take 1 tablet (50 mg total) by mouth at bedtime. 01/22/21   Pennie Banter, DO  senna-docusate (SENOKOT-S) 8.6-50 MG tablet Take 1 tablet by mouth 2 (two) times daily. 01/22/21   Esaw Grandchild A, DO  triamcinolone cream (KENALOG) 0.1 % Apply topically 2 (two) times daily. 01/22/21   Pennie Banter, DO    Inpatient Medications:   insulin aspart  0-5 Units Subcutaneous QHS   insulin aspart  0-6 Units Subcutaneous TID WC   levETIRAcetam  1,000 mg Oral BID   lidocaine  1 patch Transdermal Q24H   QUEtiapine  50 mg Oral QHS   rivastigmine  1.5 mg Oral BID     Allergies:  Allergies  Allergen Reactions   Doxycycline Swelling   Haloperidol Other (See Comments)   Lovastatin Swelling and Other (See Comments)    Oral swelling, not requiring ER, no problem with Lipitor    Social History   Socioeconomic History   Marital status: Married    Spouse name: Not on file   Number of children: Not on file   Years of education: Not on file   Highest education level: Not on file  Occupational History   Not on file  Tobacco Use   Smoking status: Former    Pack years: 0.00    Types: Cigars    Quit  date: 01/16/2017    Years since quitting: 4.2   Smokeless tobacco: Never  Substance and Sexual Activity   Alcohol use: Never   Drug use: Never   Sexual activity: Not on file  Other Topics Concern   Not on file  Social History Narrative   Not on file   Social Determinants of Health   Financial Resource Strain: Not on file  Food Insecurity: Not on file  Transportation Needs: Not on file  Physical Activity: Not on file  Stress: Not on file  Social Connections: Not on file  Intimate Partner Violence: Not  on file     Family History  Problem Relation Age of Onset   Heart disease Mother    Heart disease Father    Heart disease Sister    Leukemia Brother    Heart disease Brother    Diabetes Brother      Review of Systems: A 12-system review of systems was performed and is negative except as noted in the HPI.  Labs: No results for input(s): CKTOTAL, CKMB, TROPONINI in the last 72 hours. Lab Results  Component Value Date   WBC 5.8 04/13/2021   HGB 12.0 (L) 04/13/2021   HCT 36.8 (L) 04/13/2021   MCV 97.6 04/13/2021   PLT 142 (L) 04/13/2021    Recent Labs  Lab 04/13/21 2308  NA 140  K 3.9  CL 103  CO2 29  BUN 11  CREATININE 0.96  CALCIUM 10.4*  GLUCOSE 120*   Lab Results  Component Value Date   CHOL 84 01/17/2021   HDL 26 (L) 01/17/2021   LDLCALC 41 01/17/2021   TRIG 85 01/17/2021   No results found for: DDIMER  Radiology/Studies:  CT HEAD WO CONTRAST  Result Date: 04/14/2021 CLINICAL DATA:  Fall EXAM: CT HEAD WITHOUT CONTRAST CT CERVICAL SPINE WITHOUT CONTRAST TECHNIQUE: Multidetector CT imaging of the head and cervical spine was performed following the standard protocol without intravenous contrast. Multiplanar CT image reconstructions of the cervical spine were also generated. COMPARISON:  None. FINDINGS: CT HEAD FINDINGS Brain: There is no mass, hemorrhage or extra-axial collection. There is generalized atrophy without lobar predilection. There is  hypoattenuation of the periventricular white matter, most commonly indicating chronic ischemic microangiopathy. Old small vessel infarct of the right caudate head. Vascular: No abnormal hyperdensity of the major intracranial arteries or dural venous sinuses. No intracranial atherosclerosis. Skull: The visualized skull base, calvarium and extracranial soft tissues are normal. Sinuses/Orbits: No fluid levels or advanced mucosal thickening of the visualized paranasal sinuses. No mastoid or middle ear effusion. The orbits are normal. CT CERVICAL SPINE FINDINGS Alignment: No static subluxation. Facets are aligned. Occipital condyles are normally positioned. Skull base and vertebrae: Ankylosis of the entire length of the cervical spine. No acute fracture. Soft tissues and spinal canal: No prevertebral fluid or swelling. No visible canal hematoma. Disc levels: No advanced spinal canal or neural foraminal stenosis. Upper chest: No pneumothorax, pulmonary nodule or pleural effusion. Other: Normal visualized paraspinal cervical soft tissues. IMPRESSION: 1. No acute abnormality the head or cervical spine. 2. Diffuse cervical ankylosis. 3. 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 Electronically Signed   By: Deatra Robinson M.D.   On: 04/14/2021 01:37   CT CERVICAL SPINE WO CONTRAST  Result Date: 04/14/2021 CLINICAL DATA:  Fall EXAM: CT HEAD WITHOUT CONTRAST CT CERVICAL SPINE WITHOUT CONTRAST TECHNIQUE: Multidetector CT imaging of the head and cervical spine was performed following the standard protocol without intravenous contrast. Multiplanar CT image reconstructions of the cervical spine were also generated. COMPARISON:  None. FINDINGS: CT HEAD FINDINGS Brain: There is no mass, hemorrhage or extra-axial collection. There is generalized atrophy without lobar predilection. There is hypoattenuation of the periventricular white matter, most commonly indicating chronic ischemic microangiopathy. Old  small vessel infarct of the right caudate head. Vascular: No abnormal hyperdensity of the major intracranial arteries or dural venous sinuses. No intracranial atherosclerosis. Skull: The visualized skull base, calvarium and extracranial soft tissues are normal. Sinuses/Orbits: No fluid levels or advanced mucosal thickening of the visualized paranasal sinuses. No mastoid or middle ear effusion.  The orbits are normal. CT CERVICAL SPINE FINDINGS Alignment: No static subluxation. Facets are aligned. Occipital condyles are normally positioned. Skull base and vertebrae: Ankylosis of the entire length of the cervical spine. No acute fracture. Soft tissues and spinal canal: No prevertebral fluid or swelling. No visible canal hematoma. Disc levels: No advanced spinal canal or neural foraminal stenosis. Upper chest: No pneumothorax, pulmonary nodule or pleural effusion. Other: Normal visualized paraspinal cervical soft tissues. IMPRESSION: 1. No acute abnormality the head or cervical spine. 2. Diffuse cervical ankylosis. 3. 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 Electronically Signed   By: Deatra Robinson M.D.   On: 04/14/2021 01:37   CT CHEST ABDOMEN PELVIS W CONTRAST  Result Date: 04/14/2021 CLINICAL DATA:  80 year old male with history of trauma from a fall. Back pain. EXAM: CT CHEST, ABDOMEN, AND PELVIS WITH CONTRAST CT OF THE THORACIC SPINE WITHOUT CONTRAST CT OF THE LUMBAR SPINE WITHOUT CONTRAST TECHNIQUE: Multidetector CT imaging of the chest, abdomen and pelvis was performed following the standard protocol during bolus administration of intravenous contrast. Dedicated multiplanar reconstructions of the thoracic and lumbar spine were also generated for evaluation. CONTRAST:  75mL OMNIPAQUE IOHEXOL 300 MG/ML  SOLN COMPARISON:  Thoracic spine CT 12/19/2016. FINDINGS: CT CHEST FINDINGS Cardiovascular: No abnormal high attenuation fluid within the mediastinum to suggest posttraumatic  mediastinal hematoma. No evidence of posttraumatic aortic dissection/transection. Heart size is enlarged with biatrial dilatation. Notably, there is a filling defect in the tip of the left atrial appendage (axial image 29 of series 2), concerning for thrombus. There is no significant pericardial fluid, thickening or pericardial calcification. There is aortic atherosclerosis, as well as atherosclerosis of the great vessels of the mediastinum and the coronary arteries, including calcified atherosclerotic plaque in the left main, left anterior descending, left circumflex and right coronary arteries. Status post median sternotomy for CABG including LIMA to the LAD. Thickening calcification of the aortic valve. Calcifications of the mitral annulus. Mediastinum/Nodes: No pathologically enlarged mediastinal or hilar lymph nodes. Esophagus is unremarkable in appearance. No axillary lymphadenopathy. Lungs/Pleura: No pneumothorax. No acute consolidative airspace disease. Trace amount of pleural fluid and/or thickening in the posterolateral aspect of the right hemithorax adjacent to rib fractures (discussed below). No frank hemothorax. No left pleural fluid collection. A few scattered 1-2 mm pulmonary nodules are noted throughout the periphery of the lungs bilaterally, nonspecific. Musculoskeletal: Displaced acute fracture of the posterolateral aspect of the right seventh rib. Nondisplaced acute fracture of the posterior aspect of the right eighth rib. Old healed fracture of the posterolateral right eighth rib. Probable nondisplaced fracture of the lateral aspect of the right eighth rib. Acute nondisplaced fracture of the posterior right ninth rib. Old healed fracture of the posterolateral right ninth rib. Possible nondisplaced fracture of the posterior right tenth rib. Rod and screw fixation hardware in place from T6-T11, with no evidence of hardware fracture or loosening. No acute displaced fractures of the thoracic spine.  Mild compression of T2 vertebral body with 10% loss of anterior vertebral body height which appears to be chronic. CT ABDOMEN PELVIS FINDINGS Hepatobiliary: No evidence of acute traumatic injury to the liver. No suspicious cystic or solid hepatic lesions. No intra or extrahepatic biliary ductal dilatation. Gallbladder is normal in appearance. Pancreas: No evidence of acute traumatic injury to the pancreas. No pancreatic mass. No pancreatic ductal dilatation. No pancreatic or peripancreatic fluid collections or inflammatory changes. Spleen: No evidence of acute traumatic injury to the spleen. Unremarkable. Adrenals/Urinary Tract: No evidence of  acute traumatic injury to either kidney or adrenal gland. Multifocal cortical thinning in the kidneys bilaterally, likely sequela of prior infection or infarcts. In the anterolateral aspect of the lower pole of the left kidney (axial image 72 of series 2). There is an exophytic 4.0 x 2.7 cm intermediate attenuation (40 Hounsfield unit) lesion which is incompletely characterized on today's examination. Calcifications in both renal hila appear to be vascular. Bilateral adrenal glands are normal in appearance. No hydroureteronephrosis. Urinary bladder is partially obscured by extensive beam hardening artifact from the patient's left hip arthroplasty. With this limitation in mind, the urinary bladder appears intact and normal in appearance. Stomach/Bowel: No definitive evidence of significant acute traumatic injury to the hollow viscera. The appearance of the stomach is normal. No pathologic dilatation of small bowel or colon. 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 Normal appendix. Vascular/Lymphatic: No evidence of significant acute traumatic injury to the abdominal aorta or major arteries/veins of the abdomen or pelvis. Aortic atherosclerosis, without evidence of aneurysm or dissection in  the abdominal or pelvic vasculature. No lymphadenopathy noted in the abdomen or pelvis. Reproductive: Prostate gland and seminal vesicles are largely obscured by beam hardening artifact from the patient's left hip arthroplasty. Other: No high attenuation fluid collection noted within the peritoneal cavity or retroperitoneum to suggest significant posttraumatic hemorrhage. No significant volume of ascites. No pneumoperitoneum. Musculoskeletal: No acute displaced fractures or aggressive appearing lytic or blastic lesions are noted in the visualized portions of the skeleton. Dedicated imaging of the lumbar spine demonstrates a chronic appearing compression fracture of L1 with 25% loss of anterior vertebral body height. Status post left hip arthroplasty. IMPRESSION: 1. Multiple acute right-sided rib fractures. Most of these are nondisplaced as detailed above, however, there is a displaced fracture of the posterolateral aspect of the right seventh rib. This is associated with a trace volume of right-sided pleural fluid (not a hemothorax at this time) or thickening, but no pneumothorax noted. 2. No other evidence of significant acute traumatic injury to the abdomen or pelvis. 3. Cardiomegaly with biatrial dilatation. Notably, there is a small filling defect in the tip of the left atrial appendage concerning for left atrial appendage thrombus. If present, this would place the patient at risk for systemic embolization. Further evaluation with nonemergent transesophageal echocardiography is recommended in the near future if clinically appropriate. 4. Indeterminate lesion in the lateral aspect of the lower pole of the left kidney. This may simply represent a proteinaceous/hemorrhagic cysts, however, further evaluation with nonemergent abdominal MRI with and without IV gadolinium is recommended in the near future to exclude neoplasm. 5. Aortic atherosclerosis, in addition to left main and 3 vessel coronary artery disease.  Status post median sternotomy for CABG including LIMA to the LAD. 6. There are calcifications of the aortic valve and mitral annulus. Echocardiographic correlation for evaluation of potential valvular dysfunction may be warranted if clinically indicated. Multiple tiny 1-2 mm pulmonary nodules scattered throughout the periphery of the lungs bilaterally, nonspecific, but statistically likely benign areas of mucoid impaction within terminal bronchioles. No follow-up needed if patient is low-risk (and has no known or suspected primary neoplasm). Non-contrast chest CT can be considered in 12 months if patient is high-risk. This recommendation follows the consensus statement: Guidelines for Management of Incidental Pulmonary Nodules Detected on CT Images: From the Fleischner Society 2017; Radiology 2017; 284:228-243. 7. Additional incidental findings, as above. Electronically Signed   By: Brayton Mars.D.  On: 04/14/2021 07:03   CT T-SPINE NO CHARGE  Result Date: 04/14/2021 CLINICAL DATA:  80 year old male with history of trauma from a fall. Back pain. EXAM: CT CHEST, ABDOMEN, AND PELVIS WITH CONTRAST CT OF THE THORACIC SPINE WITHOUT CONTRAST CT OF THE LUMBAR SPINE WITHOUT CONTRAST TECHNIQUE: Multidetector CT imaging of the chest, abdomen and pelvis was performed following the standard protocol during bolus administration of intravenous contrast. Dedicated multiplanar reconstructions of the thoracic and lumbar spine were also generated for evaluation. CONTRAST:  75mL OMNIPAQUE IOHEXOL 300 MG/ML  SOLN COMPARISON:  Thoracic spine CT 12/19/2016. FINDINGS: CT CHEST FINDINGS Cardiovascular: No abnormal high attenuation fluid within the mediastinum to suggest posttraumatic mediastinal hematoma. No evidence of posttraumatic aortic dissection/transection. Heart size is enlarged with biatrial dilatation. Notably, there is a filling defect in the tip of the left atrial appendage (axial image 29 of series 2), concerning  for thrombus. There is no significant pericardial fluid, thickening or pericardial calcification. There is aortic atherosclerosis, as well as atherosclerosis of the great vessels of the mediastinum and the coronary arteries, including calcified atherosclerotic plaque in the left main, left anterior descending, left circumflex and right coronary arteries. Status post median sternotomy for CABG including LIMA to the LAD. Thickening calcification of the aortic valve. Calcifications of the mitral annulus. Mediastinum/Nodes: No pathologically enlarged mediastinal or hilar lymph nodes. Esophagus is unremarkable in appearance. No axillary lymphadenopathy. Lungs/Pleura: No pneumothorax. No acute consolidative airspace disease. Trace amount of pleural fluid and/or thickening in the posterolateral aspect of the right hemithorax adjacent to rib fractures (discussed below). No frank hemothorax. No left pleural fluid collection. A few scattered 1-2 mm pulmonary nodules are noted throughout the periphery of the lungs bilaterally, nonspecific. Musculoskeletal: Displaced acute fracture of the posterolateral aspect of the right seventh rib. Nondisplaced acute fracture of the posterior aspect of the right eighth rib. Old healed fracture of the posterolateral right eighth rib. Probable nondisplaced fracture of the lateral aspect of the right eighth rib. Acute nondisplaced fracture of the posterior right ninth rib. Old healed fracture of the posterolateral right ninth rib. Possible nondisplaced fracture of the posterior right tenth rib. Rod and screw fixation hardware in place from T6-T11, with no evidence of hardware fracture or loosening. No acute displaced fractures of the thoracic spine. Mild compression of T2 vertebral body with 10% loss of anterior vertebral body height which appears to be chronic. CT ABDOMEN PELVIS FINDINGS Hepatobiliary: No evidence of acute traumatic injury to the liver. No suspicious cystic or solid hepatic  lesions. No intra or extrahepatic biliary ductal dilatation. Gallbladder is normal in appearance. Pancreas: No evidence of acute traumatic injury to the pancreas. No pancreatic mass. No pancreatic ductal dilatation. No pancreatic or peripancreatic fluid collections or inflammatory changes. Spleen: No evidence of acute traumatic injury to the spleen. Unremarkable. Adrenals/Urinary Tract: No evidence of acute traumatic injury to either kidney or adrenal gland. Multifocal cortical thinning in the kidneys bilaterally, likely sequela of prior infection or infarcts. In the anterolateral aspect of the lower pole of the left kidney (axial image 72 of series 2). There is an exophytic 4.0 x 2.7 cm intermediate attenuation (40 Hounsfield unit) lesion which is incompletely characterized on today's examination. Calcifications in both renal hila appear to be vascular. Bilateral adrenal glands are normal in appearance. No hydroureteronephrosis. Urinary bladder is partially obscured by extensive beam hardening artifact from the patient's left hip arthroplasty. With this limitation in mind, the urinary bladder appears intact and normal in appearance. Stomach/Bowel: No definitive  evidence of significant acute traumatic injury to the hollow viscera. The appearance of the stomach is normal. No pathologic dilatation of small bowel or colon. 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 Normal appendix. Vascular/Lymphatic: No evidence of significant acute traumatic injury to the abdominal aorta or major arteries/veins of the abdomen or pelvis. Aortic atherosclerosis, without evidence of aneurysm or dissection in the abdominal or pelvic vasculature. No lymphadenopathy noted in the abdomen or pelvis. Reproductive: Prostate gland and seminal vesicles are largely obscured by beam hardening artifact from the patient's left hip arthroplasty. Other: No high  attenuation fluid collection noted within the peritoneal cavity or retroperitoneum to suggest significant posttraumatic hemorrhage. No significant volume of ascites. No pneumoperitoneum. Musculoskeletal: No acute displaced fractures or aggressive appearing lytic or blastic lesions are noted in the visualized portions of the skeleton. Dedicated imaging of the lumbar spine demonstrates a chronic appearing compression fracture of L1 with 25% loss of anterior vertebral body height. Status post left hip arthroplasty. IMPRESSION: 1. Multiple acute right-sided rib fractures. Most of these are nondisplaced as detailed above, however, there is a displaced fracture of the posterolateral aspect of the right seventh rib. This is associated with a trace volume of right-sided pleural fluid (not a hemothorax at this time) or thickening, but no pneumothorax noted. 2. No other evidence of significant acute traumatic injury to the abdomen or pelvis. 3. Cardiomegaly with biatrial dilatation. Notably, there is a small filling defect in the tip of the left atrial appendage concerning for left atrial appendage thrombus. If present, this would place the patient at risk for systemic embolization. Further evaluation with nonemergent transesophageal echocardiography is recommended in the near future if clinically appropriate. 4. Indeterminate lesion in the lateral aspect of the lower pole of the left kidney. This may simply represent a proteinaceous/hemorrhagic cysts, however, further evaluation with nonemergent abdominal MRI with and without IV gadolinium is recommended in the near future to exclude neoplasm. 5. Aortic atherosclerosis, in addition to left main and 3 vessel coronary artery disease. Status post median sternotomy for CABG including LIMA to the LAD. 6. There are calcifications of the aortic valve and mitral annulus. Echocardiographic correlation for evaluation of potential valvular dysfunction may be warranted if clinically  indicated. Multiple tiny 1-2 mm pulmonary nodules scattered throughout the periphery of the lungs bilaterally, nonspecific, but statistically likely benign areas of mucoid impaction within terminal bronchioles. No follow-up needed if patient is low-risk (and has no known or suspected primary neoplasm). Non-contrast chest CT can be considered in 12 months if patient is high-risk. This recommendation follows the consensus statement: Guidelines for Management of Incidental Pulmonary Nodules Detected on CT Images: From the Fleischner Society 2017; Radiology 2017; 284:228-243. 7. Additional incidental findings, as above. Electronically Signed   By: Trudie Reedaniel  Entrikin M.D.   On: 04/14/2021 07:03   CT L-SPINE NO CHARGE  Result Date: 04/14/2021 CLINICAL DATA:  80 year old male with history of trauma from a fall. Back pain. EXAM: CT CHEST, ABDOMEN, AND PELVIS WITH CONTRAST CT OF THE THORACIC SPINE WITHOUT CONTRAST CT OF THE LUMBAR SPINE WITHOUT CONTRAST TECHNIQUE: Multidetector CT imaging of the chest, abdomen and pelvis was performed following the standard protocol during bolus administration of intravenous contrast. Dedicated multiplanar reconstructions of the thoracic and lumbar spine were also generated for evaluation. CONTRAST:  75mL OMNIPAQUE IOHEXOL 300 MG/ML  SOLN COMPARISON:  Thoracic spine CT 12/19/2016. FINDINGS: CT CHEST FINDINGS Cardiovascular: No abnormal high attenuation fluid within  the mediastinum to suggest posttraumatic mediastinal hematoma. No evidence of posttraumatic aortic dissection/transection. Heart size is enlarged with biatrial dilatation. Notably, there is a filling defect in the tip of the left atrial appendage (axial image 29 of series 2), concerning for thrombus. There is no significant pericardial fluid, thickening or pericardial calcification. There is aortic atherosclerosis, as well as atherosclerosis of the great vessels of the mediastinum and the coronary arteries, including calcified  atherosclerotic plaque in the left main, left anterior descending, left circumflex and right coronary arteries. Status post median sternotomy for CABG including LIMA to the LAD. Thickening calcification of the aortic valve. Calcifications of the mitral annulus. Mediastinum/Nodes: No pathologically enlarged mediastinal or hilar lymph nodes. Esophagus is unremarkable in appearance. No axillary lymphadenopathy. Lungs/Pleura: No pneumothorax. No acute consolidative airspace disease. Trace amount of pleural fluid and/or thickening in the posterolateral aspect of the right hemithorax adjacent to rib fractures (discussed below). No frank hemothorax. No left pleural fluid collection. A few scattered 1-2 mm pulmonary nodules are noted throughout the periphery of the lungs bilaterally, nonspecific. Musculoskeletal: Displaced acute fracture of the posterolateral aspect of the right seventh rib. Nondisplaced acute fracture of the posterior aspect of the right eighth rib. Old healed fracture of the posterolateral right eighth rib. Probable nondisplaced fracture of the lateral aspect of the right eighth rib. Acute nondisplaced fracture of the posterior right ninth rib. Old healed fracture of the posterolateral right ninth rib. Possible nondisplaced fracture of the posterior right tenth rib. Rod and screw fixation hardware in place from T6-T11, with no evidence of hardware fracture or loosening. No acute displaced fractures of the thoracic spine. Mild compression of T2 vertebral body with 10% loss of anterior vertebral body height which appears to be chronic. CT ABDOMEN PELVIS FINDINGS Hepatobiliary: No evidence of acute traumatic injury to the liver. No suspicious cystic or solid hepatic lesions. No intra or extrahepatic biliary ductal dilatation. Gallbladder is normal in appearance. Pancreas: No evidence of acute traumatic injury to the pancreas. No pancreatic mass. No pancreatic ductal dilatation. No pancreatic or  peripancreatic fluid collections or inflammatory changes. Spleen: No evidence of acute traumatic injury to the spleen. Unremarkable. Adrenals/Urinary Tract: No evidence of acute traumatic injury to either kidney or adrenal gland. Multifocal cortical thinning in the kidneys bilaterally, likely sequela of prior infection or infarcts. In the anterolateral aspect of the lower pole of the left kidney (axial image 72 of series 2). There is an exophytic 4.0 x 2.7 cm intermediate attenuation (40 Hounsfield unit) lesion which is incompletely characterized on today's examination. Calcifications in both renal hila appear to be vascular. Bilateral adrenal glands are normal in appearance. No hydroureteronephrosis. Urinary bladder is partially obscured by extensive beam hardening artifact from the patient's left hip arthroplasty. With this limitation in mind, the urinary bladder appears intact and normal in appearance. Stomach/Bowel: No definitive evidence of significant acute traumatic injury to the hollow viscera. The appearance of the stomach is normal. No pathologic dilatation of small bowel or colon. 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 Normal appendix. Vascular/Lymphatic: No evidence of significant acute traumatic injury to the abdominal aorta or major arteries/veins of the abdomen or pelvis. Aortic atherosclerosis, without evidence of aneurysm or dissection in the abdominal or pelvic vasculature. No lymphadenopathy noted in the abdomen or pelvis. Reproductive: Prostate gland and seminal vesicles are largely obscured by beam hardening artifact from the patient's left hip arthroplasty. Other: No high attenuation fluid collection  noted within the peritoneal cavity or retroperitoneum to suggest significant posttraumatic hemorrhage. No significant volume of ascites. No pneumoperitoneum. Musculoskeletal: No acute displaced fractures or  aggressive appearing lytic or blastic lesions are noted in the visualized portions of the skeleton. Dedicated imaging of the lumbar spine demonstrates a chronic appearing compression fracture of L1 with 25% loss of anterior vertebral body height. Status post left hip arthroplasty. IMPRESSION: 1. Multiple acute right-sided rib fractures. Most of these are nondisplaced as detailed above, however, there is a displaced fracture of the posterolateral aspect of the right seventh rib. This is associated with a trace volume of right-sided pleural fluid (not a hemothorax at this time) or thickening, but no pneumothorax noted. 2. No other evidence of significant acute traumatic injury to the abdomen or pelvis. 3. Cardiomegaly with biatrial dilatation. Notably, there is a small filling defect in the tip of the left atrial appendage concerning for left atrial appendage thrombus. If present, this would place the patient at risk for systemic embolization. Further evaluation with nonemergent transesophageal echocardiography is recommended in the near future if clinically appropriate. 4. Indeterminate lesion in the lateral aspect of the lower pole of the left kidney. This may simply represent a proteinaceous/hemorrhagic cysts, however, further evaluation with nonemergent abdominal MRI with and without IV gadolinium is recommended in the near future to exclude neoplasm. 5. Aortic atherosclerosis, in addition to left main and 3 vessel coronary artery disease. Status post median sternotomy for CABG including LIMA to the LAD. 6. There are calcifications of the aortic valve and mitral annulus. Echocardiographic correlation for evaluation of potential valvular dysfunction may be warranted if clinically indicated. Multiple tiny 1-2 mm pulmonary nodules scattered throughout the periphery of the lungs bilaterally, nonspecific, but statistically likely benign areas of mucoid impaction within terminal bronchioles. No follow-up needed if  patient is low-risk (and has no known or suspected primary neoplasm). Non-contrast chest CT can be considered in 12 months if patient is high-risk. This recommendation follows the consensus statement: Guidelines for Management of Incidental Pulmonary Nodules Detected on CT Images: From the Fleischner Society 2017; Radiology 2017; [id number]. 7. Additional incidental findings, as above. Electronically Signed   By: Trudie Reed M.D.   On: 04/14/2021 07:03   DG Hip Unilat  With Pelvis 2-3 Views Right  Result Date: 04/14/2021 CLINICAL DATA:  Hip pain, fall EXAM: DG HIP (WITH OR WITHOUT PELVIS) 2-3V RIGHT COMPARISON:  None. FINDINGS: The osseous structures appear diffusely demineralized which may limit detection of small or nondisplaced fractures. Bones of the pelvis appear intact and congruent. Arcuate lines are contiguous. 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 Additional degenerative changes in the lower lumbar spine and bilateral SI joints. Enthesopathic changes noted about the pelvis. IMPRESSION: No discernible acute fracture or traumatic osseous injury. Prior left hip arthroplasty in expected positioning. Electronically Signed   By: Kreg Shropshire M.D.   On: 04/14/2021 01:01    Wt Readings from Last 3 Encounters:  01/16/21 98.1 kg  11/21/20 95.7 kg  03/19/20 101.2 kg    EKG: Atrial fibrillation with controlled ventricular response  Physical Exam:  Blood pressure (!) 175/75, pulse (!) 55, temperature 97.8 F (36.6 C), temperature source Oral, resp. rate 16, SpO2 97 %. There is no height or weight on file to calculate BMI. General: Well developed, well  nourished, in no acute distress. Head: Normocephalic, atraumatic, sclera non-icteric, no xanthomas, nares are  without discharge.  Neck: Negative for carotid bruits. JVD not elevated. Lungs: Clear bilaterally to auscultation without wheezes, rales, or rhonchi. Breathing is unlabored. Heart:  Abdomen: Soft, non-tender, non-distended with normoactive bowel sounds. No hepatomegaly. No rebound/guarding. No obvious abdominal masses. Msk:  Strength and tone appear normal for age. Extremities: No clubbing or cyanosis. No edema.  Distal pedal pulses are 2+ and equal bilaterally. Neuro: Alert and oriented X 3. No facial asymmetry. No focal deficit. Moves all      Assessment and Plan  80 y.o. male with history of coronary artery disease status post coronary artery bypass grafting x4 in 2005, dementia, history of postoperative atrial flutter status post DCCV x2 now in chronic A. fib rate controlled and anticoagulated with Eliquis history of diabetes, hypertension and seizure disorder.  He was hospitalized at [facility] in the spring with a CVA.  He was last seen at [facility] in April 2022.  Rate is controlled with metoprolol tartrate 25 mg twice daily.  He is on a statin and has had no angina.  He also has a history of hypertension treated with ramipril 10 mg twice daily and the metoprolol.  Patient apparently had a fall while his wife was not watching him.  He was trying to get up without assistance and fell from a standing position.  He usually requires assistance to get up.  There is no evidence that he had syncope.  He complained of right flank pain on presentation to the emergency room.  He is compliant with his apixaban at 5 mg twice daily as well as atorvastatin 80 mg daily, Lasix.  Chest abdomen pelvis CT revealed multiple acute right-sided rib fractures which were nondisplaced.  No pneumothorax.  Cardiomegaly with biatrial enlargement.  Felt to have a filling defect at the tip of the left atrial appendage.  Again it is noted the patient is on full dose Eliquis.  EKG shows atrial fibrillation with controlled ventricular  response.  Laboratories revealed normal potassium.  Creatinine is normal.  1.  Atrial fibrillation-continue with rate control with metoprolol.  Has failed DCCV x2 and has been chronically anticoagulated with Eliquis at full dose 5 mg twice daily.  We will continue with rate control and Eliquis.  Left atrial appendage filling defect at the tip.  This was noted on CT.  Given the fact patient is on full dose Eliquis as well as in A. fib, do not feel that TEE will change our treatment protocol.  And transthoracic echo will not change current treatment recommended as it will not visualize the we will continue with Eliquis and follow.  No further inpatient cardiac work-up indicated.  2.  Mechanical fall-does not appear to be secondary to syncope.  Chest pain appears to be related to rib fractures.  Will follow.  Would agree with physical therapy.  3.  Rib fractures-seventh rib is displaced somewhat.  No pneumothorax.  Patient's family is interested in him going home.  We will I have ordered physical therapy and will discuss with hospitalist.  Signed, Dalia Heading MD 04/14/2021, 1:05 PM Pager: [phone]

## 2021-04-14 NOTE — ED Notes (Signed)
Daughter in room assisting with patient eating breakfast

## 2021-04-14 NOTE — Plan of Care (Signed)
  Problem: Clinical Measurements: Goal: Ability to maintain clinical measurements within normal limits will improve Outcome: Progressing Goal: Will remain free from infection Outcome: Progressing Goal: Diagnostic test results will improve Outcome: Progressing Goal: Respiratory complications will improve Outcome: Progressing Goal: Cardiovascular complication will be avoided Outcome: Progressing   Problem: Activity: Goal: Risk for activity intolerance will decrease Outcome: Progressing   Problem: Elimination: Goal: Will not experience complications related to bowel motility Outcome: Progressing Goal: Will not experience complications related to urinary retention Outcome: Progressing   Problem: Safety: Goal: Ability to remain free from injury will improve Outcome: Progressing   Problem: Skin Integrity: Goal: Risk for impaired skin integrity will decrease Outcome: Progressing

## 2021-04-14 NOTE — ED Notes (Signed)
Patient sheets changed and new brief placed.

## 2021-04-14 NOTE — Discharge Instructions (Addendum)

## 2021-04-14 NOTE — Evaluation (Signed)
Occupational Therapy Evaluation Patient Details Name: Ian King MRN: [id number] DOB: [birth year] Today's Date: 04/14/2021    History of Present Illness Pt is an 80 y.o. male presenting to hospital 7/12 s/p mechanical fall (pt got up without assistance); c/o R flank pain.  Pt admitted with multiple R sided nondisplaced rib fx's; also displaced fx posterolateral aspect R 7th rib (no pneumothorax noted).  Imaging showing concern for L atrial appendage thrombus (cardiology consulted).  PMH includes dementia, a-fib on Eliquis, DM, htn, CVS, CAD, back trauma s/p spinal surgery, stroke, Raynaud phenomenon, ankylosing spondylitis, seizure disorder, arm sx, hip replacement, knee replacement.   Clinical Impression   Pt seen for OT evaluation this date in setting of acute hospitalization d/t fall at home. Pt's dtr present throughout session and provides most PLOF info as pt is poor historian. Pt was able to walk with AD and assistance at home at baseline, but would often forger correct use of AD or to ask for assistance which is when falls occur and dtr states they primarily occur backward. Pt present this date with decreased fxl activity tolerance, balance, and rib pain superimposed on baseline confusion, impacting his ability to safely perform ADLs. Pt currently requiring: SETUP to MIN A for seated UB ADLs, MOD/MAX A for seated LB ADLs and cues throughout for sequence/safety. In addition, pt requiring MIN A with RW for ADL transfers. Will continue to follow acutely. Anticipate pt can return to home environment with his typical supports in place (home health aide and spouse assist) with addition of home health OT to assist with fall prevention strategies and home modifications as needed.     Follow Up Recommendations  Home health OT;Supervision/Assistance - 24 hour    Equipment Recommendations  3 in 1 bedside commode;Tub/shower seat    Recommendations for Other Services       Precautions /  Restrictions Precautions Precautions: Fall Precaution Comments: Seizure precautions Restrictions Weight Bearing Restrictions: No      Mobility Bed Mobility Overal bed mobility: Needs Assistance Bed Mobility: Supine to Sit     Supine to sit: Mod assist;HOB elevated     General bed mobility comments: up to chair pre/post    Transfers Overall transfer level: Needs assistance Equipment used: Rolling walker (2 wheeled) Transfers: Sit to/from Stand Sit to Stand: Min assist         General transfer comment: requires MIN/MOD verbal/tactile cues for safe hand palcement/sequence of STS with RW. Increased assist from recliner versus EOB.    Balance Overall balance assessment: Needs assistance Sitting-balance support: Feet supported;Bilateral upper extremity supported Sitting balance-Leahy Scale: Poor Sitting balance - Comments: P static unsupported sitting requiring assit to stabilize, potentially leaning to L to decreased R rib pain Postural control: Left lateral lean Standing balance support: Bilateral upper extremity supported Standing balance-Leahy Scale: Poor Standing balance comment: noted to brace LEs on back of chair and generally requires UE support on RW. Cannot tolerate challenge.                           ADL either performed or assessed with clinical judgement   ADL Overall ADL's : Needs assistance/impaired                                       General ADL Comments: SETUP to MIN A for seated UB ADLs, MOD/MAX A for seated  LB ADLs     Vision Patient Visual Report: No change from baseline       Perception     Praxis      Pertinent Vitals/Pain Pain Assessment: 0-10 Pain Score: 5  Faces Pain Scale: Hurts a little bit Pain Location: R sided ribs Pain Descriptors / Indicators: Grimacing;Guarding Pain Intervention(s): Limited activity within patient's tolerance;Monitored during session;Repositioned     Hand Dominance      Extremity/Trunk Assessment Upper Extremity Assessment Upper Extremity Assessment: Generalized weakness   Lower Extremity Assessment Lower Extremity Assessment: Generalized weakness   Cervical / Trunk Assessment Cervical / Trunk Assessment: Other exceptions Cervical / Trunk Exceptions: forward head/shoulders   Communication Communication Communication: HOH   Cognition Arousal/Alertness: Awake/alert Behavior During Therapy: Flat affect Overall Cognitive Status: History of cognitive impairments - at baseline                                 General Comments: Oriented to at least person; pt very HOH. Follows ~50% simple one step commands with increased processing time. Dtr reports that he reads lips   General Comments       Exercises Other Exercises Other Exercises: OT engages pt in 10 GS while seated in chair. Pt requires moderate tactile/verbal cues for technique and pace.   Shoulder Instructions      Home Living Family/patient expects to be discharged to:: Private residence Living Arrangements: Spouse/significant other Available Help at Discharge: Family;Personal care attendant;Available 24 hours/day Type of Home: House Home Access: Stairs to enter [employer] of Steps: 6 Entrance Stairs-Rails: Left Home Layout: One level     Bathroom Shower/Tub: Producer, television/film/video: Handicapped height     Home Equipment: Walker - 2 wheels;Grab bars - toilet;Grab bars - tub/shower;Shower seat - built in;Hospital bed          Prior Functioning/Environment Level of Independence: Needs assistance  Gait / Transfers Assistance Needed: Ambulates with RW (has assist for all functional mobility for safety); h/o falls ADL's / Homemaking Assistance Needed: spouse assists with bathing/dressing tasks, mostly lower body. 2 caregivers that split time during the week   Comments: Has sitter during the day (pt's wife assists pt at night).  Pt sleeps in  hospital bed vs queen sized bed vs recliner depending on the day.        OT Problem List: Decreased strength;Decreased activity tolerance      OT Treatment/Interventions: Self-care/ADL training;Balance training;Therapeutic exercise;Therapeutic activities;Patient/family education    OT Goals(Current goals can be found in the care plan section) Acute Rehab OT Goals Patient Stated Goal: to go home OT Goal Formulation: With patient/family Time For Goal Achievement: 04/28/21 Potential to Achieve Goals: Fair ADL Goals Pt Will Transfer to Toilet: with supervision;ambulating (with RLAD) Pt Will Perform Toileting - Clothing Manipulation and hygiene: with supervision;sit to/from stand (with use of grab bars) Pt Will Perform Tub/Shower Transfer: with supervision;ambulating;shower seat;grab bars;rolling walker Pt/caregiver will Perform Home Exercise Program: Increased strength;Both right and left upper extremity;With minimal assist  OT Frequency: Min 1X/week   Barriers to D/C:            Co-evaluation              AM-PAC OT "6 Clicks" Daily Activity     Outcome Measure Help from another person eating meals?: A Little Help from another person taking care of personal grooming?: A Little Help from another person toileting, which  includes using toliet, bedpan, or urinal?: A Lot Help from another person bathing (including washing, rinsing, drying)?: A Lot Help from another person to put on and taking off regular upper body clothing?: A Little Help from another person to put on and taking off regular lower body clothing?: A Lot 6 Click Score: 15   End of Session Equipment Utilized During Treatment: Gait belt;Rolling walker Nurse Communication: Mobility status  Activity Tolerance: Patient tolerated treatment well Patient left: in chair;with call bell/phone within reach;with chair alarm set;with family/visitor present  OT Visit Diagnosis: Unsteadiness on feet (R26.81);Muscle weakness  (generalized) (M62.81)                Time: 1540-1611 OT Time Calculation (min): 31 min Charges:  OT General Charges $OT Visit: 1 Visit OT Evaluation $OT Eval Moderate Complexity: 1 Mod OT Treatments $Self Care/Home Management : 8-22 mins  Rejeana Brock, MS, OTR/L ascom [phone] 04/14/21, 6:32 PM

## 2021-04-14 NOTE — H&P (Signed)
History and Physical    Ian King EGB:[id number] DOB: 1941-07-14 DOA: 04/14/2021  Referring MD/NP/PA:   PCP: Marguarite Arbour, MD   Patient coming from:  The patient is coming from home.      Chief Complaint: fall and right flank pain.   HPI: Ian King is a 80 y.o. male with medical history significant of HTN, HLD, DM, stroke, gout, depression, A fib on Eliquis, Vb12 deficiency, chronic venous insufficiency change, seizure, dementia, COVID-19 infection 02/19/2021,n who presents with fall and right flank pain.   Per his daughter at bedside, pt fell accidentally when he got up, trying to turn on the air conditioning at about 10:00 PM last night. No LOC. Pt complains of sharp pain in right flank and right rib cage, some back and right hip pain. Patient reports that he did not hit his head.  Patient does not have cough, shortness of breath, nausea, vomiting, diarrhea or abdominal pain.  No symptoms of UTI.  Patient moves all extremities.  ED Course: pt was found to have WBC 5.8, temperature normal, blood pressure 211/84, 173/85, heart rate 86, RR 16, oxygen saturation 93-98% on room air.  CT of head and CT of C-spine is negative for acute injury. Pt is placed on MedSurg bed for observation, Dr. Lady Gary of cardiology is consulted.  CT of chest/abdomen/pelvis, L-spine and T-spine showed: 1. Multiple acute right-sided rib fractures. Most of these are nondisplaced as detailed above, however, there is a displaced fracture of the posterolateral aspect of the right seventh rib. This is associated with a trace volume of right-sided pleural fluid (not a hemothorax at this time) or thickening, but no pneumothorax noted. 2. No other evidence of significant acute traumatic injury to the abdomen or pelvis. 3. Cardiomegaly with biatrial dilatation. Notably, there is a small filling defect in the tip of the left atrial appendage concerning for left atrial appendage thrombus. If present, this would  place the patient at risk for systemic embolization. Further evaluation with nonemergent transesophageal echocardiography is recommended in the near future if clinically appropriate. 4. Indeterminate lesion in the lateral aspect of the lower pole of the left kidney. This may simply represent a proteinaceous/hemorrhagic cysts, however, further evaluation with nonemergent abdominal MRI with and without IV gadolinium is recommended in the near future to exclude neoplasm. 5. Aortic atherosclerosis, in addition to left main and 3 vessel coronary artery disease. Status post median sternotomy for CABG including LIMA to the LAD. 6. There are calcifications of the aortic valve and mitral annulus. Echocardiographic correlation for evaluation of potential valvular dysfunction may be warranted if clinically indicated. Multiple tiny 1-2 mm pulmonary nodules scattered throughout the periphery of the lungs bilaterally, nonspecific, but statistically likely benign areas of mucoid impaction within terminal bronchioles. No follow-up needed if patient is low-risk (and has no known or suspected primary neoplasm). Non-contrast chest CT can be considered in 12 months if patient is high-risk. This recommendation follows the consensus statement: Guidelines for Management of Incidental Pulmonary Nodules Detected on CT Images: From the Fleischner Society 2017; Radiology 2017; 284:228-243. 7. Additional incidental findings, as above.  Review of Systems:   General: no fevers, chills, no body weight gain,  has fatigue HEENT: no blurry vision, hearing changes or sore throat Respiratory: no dyspnea, coughing, wheezing CV: no chest pain, no palpitations GI: no nausea, vomiting, abdominal pain, diarrhea, constipation GU: no dysuria, burning on urination, increased urinary frequency, hematuria  Ext: no leg edema Neuro: no unilateral weakness, numbness,  or tingling, no vision change or hearing loss Skin: no rash,  no skin tear. MSK: has pain in right flank and right rib cage.  Heme: No easy bruising.  Travel history: No recent long distant travel.  Allergy:  Allergies  Allergen Reactions   Doxycycline Swelling   Haloperidol Other (See Comments)   Lovastatin Swelling and Other (See Comments)    Oral swelling, not requiring ER, no problem with Lipitor    Past Medical History:  Diagnosis Date   A-fib (HCC)    Diabetes mellitus without complication (HCC)    Hypertension     Past Surgical History:  Procedure Laterality Date   arm surgery     BACK SURGERY     JOINT REPLACEMENT     hip   JOINT REPLACEMENT Right    knee   SPINE SURGERY      Social History:  reports that he quit smoking about 4 years ago. His smoking use included cigars. He has never used smokeless tobacco. He reports that he does not drink alcohol and does not use drugs.  Family History:  Family History  Problem Relation Age of Onset   Heart disease Mother    Heart disease Father    Heart disease Sister    Leukemia Brother    Heart disease Brother    Diabetes Brother      Prior to Admission medications   Medication Sig Start Date End Date Taking? Authorizing Provider  acetaminophen (TYLENOL) 325 MG tablet Take 2 tablets (650 mg total) by mouth every 6 (six) hours as needed for mild pain, fever or headache. 01/22/21   Pennie Banter, DO  apixaban (ELIQUIS) 5 MG TABS tablet Take 1 tablet (5 mg total) by mouth 2 (two) times daily. 01/22/21   Pennie Banter, DO  atorvastatin (LIPITOR) 20 MG tablet Take 4 tablets (80 mg total) by mouth at bedtime. 01/22/21   Pennie Banter, DO  bisacodyl (DULCOLAX) 10 MG suppository Place 1 suppository (10 mg total) rectally daily as needed for severe constipation. 01/22/21   Pennie Banter, DO  bisacodyl (DULCOLAX) 5 MG EC tablet Take 1 tablet (5 mg total) by mouth daily as needed for moderate constipation. 01/22/21   Pennie Banter, DO  cholecalciferol (VITAMIN D3) 25 MCG  (1000 UNIT) tablet Take 1,000 Units by mouth daily.    [provider]  feeding supplement (ENSURE ENLIVE / ENSURE PLUS) LIQD Take 237 mLs by mouth 2 (two) times daily between meals. 01/23/21   Pennie Banter, DO  furosemide (LASIX) 20 MG tablet Take 20 mg by mouth 4 (four) times a week.     [provider]  hydrocerin (EUCERIN) CREA Apply 1 application topically 2 (two) times daily. 01/22/21   Pennie Banter, DO  ketoconazole (NIZORAL) 2 % shampoo Apply 1 application topically 2 (two) times a week.    [provider]  levETIRAcetam (KEPPRA) 1000 MG tablet 1,000 mg 2 (two) times daily. 03/10/20   [provider]  metFORMIN (GLUCOPHAGE-XR) 500 MG 24 hr tablet Take 1,000 mg by mouth in the morning and at bedtime.     [provider]  metoprolol tartrate (LOPRESSOR) 25 MG tablet Take 25 mg by mouth 2 (two) times daily.    [provider]  Multiple Vitamin (MULTIVITAMIN WITH MINERALS) TABS tablet Take 1 tablet by mouth daily. 01/23/21   Pennie Banter, DO  Omega-3 Fatty Acids (FISH OIL) 1000 MG CAPS Take 1,000 mg by mouth  in the morning and at bedtime.    [provider]  polyethylene glycol (MIRALAX / GLYCOLAX) 17 g packet Take 17 g by mouth daily. 01/23/21   Pennie Banter, DO  QUEtiapine (SEROQUEL) 25 MG tablet Take 1 tablet (25 mg total) by mouth 2 (two) times daily as needed (agitation). 01/22/21   Pennie Banter, DO  QUEtiapine (SEROQUEL) 50 MG tablet Take 1 tablet (50 mg total) by mouth at bedtime. 01/22/21   Esaw Grandchild A, DO  ramipril (ALTACE) 10 MG capsule Take 10 mg by mouth 2 (two) times daily.    [provider]  rivastigmine (EXELON) 1.5 MG capsule Take 1 capsule (1.5 mg total) by mouth 2 (two) times daily. 01/22/21   Pennie Banter, DO  senna-docusate (SENOKOT-S) 8.6-50 MG tablet Take 1 tablet by mouth 2 (two) times daily. 01/22/21   Esaw Grandchild A, DO  triamcinolone cream (KENALOG) 0.1 % Apply topically  2 (two) times daily. 01/22/21   Pennie Banter, DO    Physical Exam: Vitals:   04/14/21 0745 04/14/21 0830 04/14/21 1025 04/14/21 1617  BP: (!) 168/68 (!) 154/93 (!) 175/75 135/75  Pulse: (!) 46 (!) 45 (!) 55 (!) 45  Resp:  Temp:  98.1 F (36.7 C) 97.8 F (36.6 C) 98.2 F (36.8 C)  TempSrc:  Oral Oral   SpO2: 95% 97% 97% 98%   General: Not in acute distress HEENT:       Eyes: PERRL, EOMI, no scleral icterus.       ENT: No discharge from the ears and nose, no pharynx injection, no tonsillar enlargement.        Neck: No JVD, no bruit, no mass felt. Heme: No neck lymph node enlargement. Cardiac: S1/S2, RRR, No murmurs, No gallops or rubs. Respiratory: No rales, wheezing, rhonchi or rubs. GI: Soft, nondistended, nontender, no rebound pain, no organomegaly, BS present. GU: No hematuria Ext: No pitting leg edema bilaterally. 1+DP/PT pulse bilaterally. Musculoskeletal: has tenderness in right rib cage Skin: No rashes.  Neuro: Alert, cranial nerves II-XII grossly intact, moves all extremities  Psych: Patient is not psychotic, no suicidal or hemocidal ideation.  Labs on Admission: I have personally reviewed following labs and imaging studies  CBC: Recent Labs  Lab 04/13/21 2308  WBC 5.8  HGB 12.0*  HCT 36.8*  MCV 97.6  PLT 142*   Basic Metabolic Panel: Recent Labs  Lab 04/13/21 2308  NA 140  K 3.9  CL 103  CO2 29  GLUCOSE 120*  BUN 11  CREATININE 0.96  CALCIUM 10.4*   GFR: CrCl cannot be calculated (Unknown ideal weight.). Liver Function Tests: No results for input(s): AST, ALT, ALKPHOS, BILITOT, PROT, ALBUMIN in the last 168 hours. No results for input(s): LIPASE, AMYLASE in the last 168 hours. No results for input(s): AMMONIA in the last 168 hours. Coagulation Profile: Recent Labs  Lab 04/14/21 0750  INR 1.2   Cardiac Enzymes: No results for input(s): CKTOTAL, CKMB, CKMBINDEX, TROPONINI in the last 168 hours. BNP (last 3 results) No results  for input(s): PROBNP in the last 8760 hours. HbA1C: No results for input(s): HGBA1C in the last 72 hours. CBG: Recent Labs  Lab 04/14/21 1220 04/14/21 1618  GLUCAP 170* 165*   Lipid Profile: No results for input(s): CHOL, HDL, LDLCALC, TRIG, CHOLHDL, LDLDIRECT in the last 72 hours. Thyroid Function Tests: No results for input(s): TSH, T4TOTAL, FREET4, T3FREE, THYROIDAB in the last 72 hours. Anemia Panel: No results  for input(s): VITAMINB12, FOLATE, FERRITIN, TIBC, IRON, RETICCTPCT in the last 72 hours. Urine analysis:    Component Value Date/Time   COLORURINE YELLOW (A) 04/13/2021 2305   APPEARANCEUR CLEAR (A) 04/13/2021 2305   LABSPEC >1.046 (H) 04/13/2021 2305   PHURINE 7.0 04/13/2021 2305   GLUCOSEU 50 (A) 04/13/2021 2305   HGBUR NEGATIVE 04/13/2021 2305   BILIRUBINUR NEGATIVE 04/13/2021 2305   KETONESUR 5 (A) 04/13/2021 2305   PROTEINUR NEGATIVE 04/13/2021 2305   NITRITE NEGATIVE 04/13/2021 2305   LEUKOCYTESUR NEGATIVE 04/13/2021 2305   Sepsis Labs: @LABRCNTIP (procalcitonin:4,lacticidven:4) )No results found for this or any previous visit (from the past 240 hour(s)).   Radiological Exams on Admission: CT HEAD WO CONTRAST  Result Date: 04/14/2021 CLINICAL DATA:  Fall EXAM: CT HEAD WITHOUT CONTRAST CT CERVICAL SPINE WITHOUT CONTRAST TECHNIQUE: Multidetector CT imaging of the head and cervical spine was performed following the standard protocol without intravenous contrast. Multiplanar CT image reconstructions of the cervical spine were also generated. COMPARISON:  None. FINDINGS: CT HEAD FINDINGS Brain: There is no mass, hemorrhage or extra-axial collection. There is generalized atrophy without lobar predilection. There is hypoattenuation of the periventricular white matter, most commonly indicating chronic ischemic microangiopathy. Old small vessel infarct of the right caudate head. Vascular: No abnormal hyperdensity of the major intracranial arteries or dural venous sinuses.  No intracranial atherosclerosis. Skull: The visualized skull base, calvarium and extracranial soft tissues are normal. Sinuses/Orbits: No fluid levels or advanced mucosal thickening of the visualized paranasal sinuses. No mastoid or middle ear effusion. The orbits are normal. CT CERVICAL SPINE FINDINGS Alignment: No static subluxation. Facets are aligned. Occipital condyles are normally positioned. Skull base and vertebrae: Ankylosis of the entire length of the cervical spine. No acute fracture. Soft tissues and spinal canal: No prevertebral fluid or swelling. No visible canal hematoma. Disc levels: No advanced spinal canal or neural foraminal stenosis. Upper chest: No pneumothorax, pulmonary nodule or pleural effusion. Other: Normal visualized paraspinal cervical soft tissues. IMPRESSION: 1. No acute abnormality the head or cervical spine. 2. Diffuse cervical ankylosis. 3. 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 Electronically Signed   By: 04/16/2021 M.D.   On: 04/14/2021 01:37   CT CERVICAL SPINE WO CONTRAST  Result Date: 04/14/2021 CLINICAL DATA:  Fall EXAM: CT HEAD WITHOUT CONTRAST CT CERVICAL SPINE WITHOUT CONTRAST TECHNIQUE: Multidetector CT imaging of the head and cervical spine was performed following the standard protocol without intravenous contrast. Multiplanar CT image reconstructions of the cervical spine were also generated. COMPARISON:  None. FINDINGS: CT HEAD FINDINGS Brain: There is no mass, hemorrhage or extra-axial collection. There is generalized atrophy without lobar predilection. There is hypoattenuation of the periventricular white matter, most commonly indicating chronic ischemic microangiopathy. Old small vessel infarct of the right caudate head. Vascular: No abnormal hyperdensity of the major intracranial arteries or dural venous sinuses. No intracranial atherosclerosis. Skull: The visualized skull base, calvarium and extracranial soft tissues are normal.  Sinuses/Orbits: No fluid levels or advanced mucosal thickening of the visualized paranasal sinuses. No mastoid or middle ear effusion. The orbits are normal. CT CERVICAL SPINE FINDINGS Alignment: No static subluxation. Facets are aligned. Occipital condyles are normally positioned. Skull base and vertebrae: Ankylosis of the entire length of the cervical spine. No acute fracture. Soft tissues and spinal canal: No prevertebral fluid or swelling. No visible canal hematoma. Disc levels: No advanced spinal canal or neural foraminal stenosis. Upper chest: No pneumothorax, pulmonary nodule or pleural effusion. Other: Normal visualized paraspinal cervical  soft tissues. IMPRESSION: 1. No acute abnormality the head or cervical spine. 2. Diffuse cervical ankylosis. 3. 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 Electronically Signed   By: Deatra Robinson M.D.   On: 04/14/2021 01:37   CT CHEST ABDOMEN PELVIS W CONTRAST  Result Date: 04/14/2021 CLINICAL DATA:  80 year old male with history of trauma from a fall. Back pain. EXAM: CT CHEST, ABDOMEN, AND PELVIS WITH CONTRAST CT OF THE THORACIC SPINE WITHOUT CONTRAST CT OF THE LUMBAR SPINE WITHOUT CONTRAST TECHNIQUE: Multidetector CT imaging of the chest, abdomen and pelvis was performed following the standard protocol during bolus administration of intravenous contrast. Dedicated multiplanar reconstructions of the thoracic and lumbar spine were also generated for evaluation. CONTRAST:  75mL OMNIPAQUE IOHEXOL 300 MG/ML  SOLN COMPARISON:  Thoracic spine CT 12/19/2016. FINDINGS: CT CHEST FINDINGS Cardiovascular: No abnormal high attenuation fluid within the mediastinum to suggest posttraumatic mediastinal hematoma. No evidence of posttraumatic aortic dissection/transection. Heart size is enlarged with biatrial dilatation. Notably, there is a filling defect in the tip of the left atrial appendage (axial image 29 of series 2), concerning for thrombus. There is  no significant pericardial fluid, thickening or pericardial calcification. There is aortic atherosclerosis, as well as atherosclerosis of the great vessels of the mediastinum and the coronary arteries, including calcified atherosclerotic plaque in the left main, left anterior descending, left circumflex and right coronary arteries. Status post median sternotomy for CABG including LIMA to the LAD. Thickening calcification of the aortic valve. Calcifications of the mitral annulus. Mediastinum/Nodes: No pathologically enlarged mediastinal or hilar lymph nodes. Esophagus is unremarkable in appearance. No axillary lymphadenopathy. Lungs/Pleura: No pneumothorax. No acute consolidative airspace disease. Trace amount of pleural fluid and/or thickening in the posterolateral aspect of the right hemithorax adjacent to rib fractures (discussed below). No frank hemothorax. No left pleural fluid collection. A few scattered 1-2 mm pulmonary nodules are noted throughout the periphery of the lungs bilaterally, nonspecific. Musculoskeletal: Displaced acute fracture of the posterolateral aspect of the right seventh rib. Nondisplaced acute fracture of the posterior aspect of the right eighth rib. Old healed fracture of the posterolateral right eighth rib. Probable nondisplaced fracture of the lateral aspect of the right eighth rib. Acute nondisplaced fracture of the posterior right ninth rib. Old healed fracture of the posterolateral right ninth rib. Possible nondisplaced fracture of the posterior right tenth rib. Rod and screw fixation hardware in place from T6-T11, with no evidence of hardware fracture or loosening. No acute displaced fractures of the thoracic spine. Mild compression of T2 vertebral body with 10% loss of anterior vertebral body height which appears to be chronic. CT ABDOMEN PELVIS FINDINGS Hepatobiliary: No evidence of acute traumatic injury to the liver. No suspicious cystic or solid hepatic lesions. No intra or  extrahepatic biliary ductal dilatation. Gallbladder is normal in appearance. Pancreas: No evidence of acute traumatic injury to the pancreas. No pancreatic mass. No pancreatic ductal dilatation. No pancreatic or peripancreatic fluid collections or inflammatory changes. Spleen: No evidence of acute traumatic injury to the spleen. Unremarkable. Adrenals/Urinary Tract: No evidence of acute traumatic injury to either kidney or adrenal gland. Multifocal cortical thinning in the kidneys bilaterally, likely sequela of prior infection or infarcts. In the anterolateral aspect of the lower pole of the left kidney (axial image 72 of series 2). There is an exophytic 4.0 x 2.7 cm intermediate attenuation (40 Hounsfield unit) lesion which is incompletely characterized on today's examination. Calcifications in both renal hila appear to be vascular. Bilateral adrenal glands  are normal in appearance. No hydroureteronephrosis. Urinary bladder is partially obscured by extensive beam hardening artifact from the patient's left hip arthroplasty. With this limitation in mind, the urinary bladder appears intact and normal in appearance. Stomach/Bowel: No definitive evidence of significant acute traumatic injury to the hollow viscera. The appearance of the stomach is normal. No pathologic dilatation of small bowel or colon. 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 Normal appendix. Vascular/Lymphatic: No evidence of significant acute traumatic injury to the abdominal aorta or major arteries/veins of the abdomen or pelvis. Aortic atherosclerosis, without evidence of aneurysm or dissection in the abdominal or pelvic vasculature. No lymphadenopathy noted in the abdomen or pelvis. Reproductive: Prostate gland and seminal vesicles are largely obscured by beam hardening artifact from the patient's left hip arthroplasty. Other: No high attenuation fluid  collection noted within the peritoneal cavity or retroperitoneum to suggest significant posttraumatic hemorrhage. No significant volume of ascites. No pneumoperitoneum. Musculoskeletal: No acute displaced fractures or aggressive appearing lytic or blastic lesions are noted in the visualized portions of the skeleton. Dedicated imaging of the lumbar spine demonstrates a chronic appearing compression fracture of L1 with 25% loss of anterior vertebral body height. Status post left hip arthroplasty. IMPRESSION: 1. Multiple acute right-sided rib fractures. Most of these are nondisplaced as detailed above, however, there is a displaced fracture of the posterolateral aspect of the right seventh rib. This is associated with a trace volume of right-sided pleural fluid (not a hemothorax at this time) or thickening, but no pneumothorax noted. 2. No other evidence of significant acute traumatic injury to the abdomen or pelvis. 3. Cardiomegaly with biatrial dilatation. Notably, there is a small filling defect in the tip of the left atrial appendage concerning for left atrial appendage thrombus. If present, this would place the patient at risk for systemic embolization. Further evaluation with nonemergent transesophageal echocardiography is recommended in the near future if clinically appropriate. 4. Indeterminate lesion in the lateral aspect of the lower pole of the left kidney. This may simply represent a proteinaceous/hemorrhagic cysts, however, further evaluation with nonemergent abdominal MRI with and without IV gadolinium is recommended in the near future to exclude neoplasm. 5. Aortic atherosclerosis, in addition to left main and 3 vessel coronary artery disease. Status post median sternotomy for CABG including LIMA to the LAD. 6. There are calcifications of the aortic valve and mitral annulus. Echocardiographic correlation for evaluation of potential valvular dysfunction may be warranted if clinically indicated. Multiple  tiny 1-2 mm pulmonary nodules scattered throughout the periphery of the lungs bilaterally, nonspecific, but statistically likely benign areas of mucoid impaction within terminal bronchioles. No follow-up needed if patient is low-risk (and has no known or suspected primary neoplasm). Non-contrast chest CT can be considered in 12 months if patient is high-risk. This recommendation follows the consensus statement: Guidelines for Management of Incidental Pulmonary Nodules Detected on CT Images: From the Fleischner Society 2017; Radiology 2017; 284:228-243. 7. Additional incidental findings, as above. Electronically Signed   By: Trudie Reed M.D.   On: 04/14/2021 07:03   CT T-SPINE NO CHARGE  Result Date: 04/14/2021 CLINICAL DATA:  80 year old male with history of trauma from a fall. Back pain. EXAM: CT CHEST, ABDOMEN, AND PELVIS WITH CONTRAST CT OF THE THORACIC SPINE WITHOUT CONTRAST CT OF THE LUMBAR SPINE WITHOUT CONTRAST TECHNIQUE: Multidetector CT imaging of the chest, abdomen and pelvis was performed following the standard protocol during bolus administration of intravenous contrast. Dedicated  multiplanar reconstructions of the thoracic and lumbar spine were also generated for evaluation. CONTRAST:  75mL OMNIPAQUE IOHEXOL 300 MG/ML  SOLN COMPARISON:  Thoracic spine CT 12/19/2016. FINDINGS: CT CHEST FINDINGS Cardiovascular: No abnormal high attenuation fluid within the mediastinum to suggest posttraumatic mediastinal hematoma. No evidence of posttraumatic aortic dissection/transection. Heart size is enlarged with biatrial dilatation. Notably, there is a filling defect in the tip of the left atrial appendage (axial image 29 of series 2), concerning for thrombus. There is no significant pericardial fluid, thickening or pericardial calcification. There is aortic atherosclerosis, as well as atherosclerosis of the great vessels of the mediastinum and the coronary arteries, including calcified atherosclerotic  plaque in the left main, left anterior descending, left circumflex and right coronary arteries. Status post median sternotomy for CABG including LIMA to the LAD. Thickening calcification of the aortic valve. Calcifications of the mitral annulus. Mediastinum/Nodes: No pathologically enlarged mediastinal or hilar lymph nodes. Esophagus is unremarkable in appearance. No axillary lymphadenopathy. Lungs/Pleura: No pneumothorax. No acute consolidative airspace disease. Trace amount of pleural fluid and/or thickening in the posterolateral aspect of the right hemithorax adjacent to rib fractures (discussed below). No frank hemothorax. No left pleural fluid collection. A few scattered 1-2 mm pulmonary nodules are noted throughout the periphery of the lungs bilaterally, nonspecific. Musculoskeletal: Displaced acute fracture of the posterolateral aspect of the right seventh rib. Nondisplaced acute fracture of the posterior aspect of the right eighth rib. Old healed fracture of the posterolateral right eighth rib. Probable nondisplaced fracture of the lateral aspect of the right eighth rib. Acute nondisplaced fracture of the posterior right ninth rib. Old healed fracture of the posterolateral right ninth rib. Possible nondisplaced fracture of the posterior right tenth rib. Rod and screw fixation hardware in place from T6-T11, with no evidence of hardware fracture or loosening. No acute displaced fractures of the thoracic spine. Mild compression of T2 vertebral body with 10% loss of anterior vertebral body height which appears to be chronic. CT ABDOMEN PELVIS FINDINGS Hepatobiliary: No evidence of acute traumatic injury to the liver. No suspicious cystic or solid hepatic lesions. No intra or extrahepatic biliary ductal dilatation. Gallbladder is normal in appearance. Pancreas: No evidence of acute traumatic injury to the pancreas. No pancreatic mass. No pancreatic ductal dilatation. No pancreatic or peripancreatic fluid  collections or inflammatory changes. Spleen: No evidence of acute traumatic injury to the spleen. Unremarkable. Adrenals/Urinary Tract: No evidence of acute traumatic injury to either kidney or adrenal gland. Multifocal cortical thinning in the kidneys bilaterally, likely sequela of prior infection or infarcts. In the anterolateral aspect of the lower pole of the left kidney (axial image 72 of series 2). There is an exophytic 4.0 x 2.7 cm intermediate attenuation (40 Hounsfield unit) lesion which is incompletely characterized on today's examination. Calcifications in both renal hila appear to be vascular. Bilateral adrenal glands are normal in appearance. No hydroureteronephrosis. Urinary bladder is partially obscured by extensive beam hardening artifact from the patient's left hip arthroplasty. With this limitation in mind, the urinary bladder appears intact and normal in appearance. Stomach/Bowel: No definitive evidence of significant acute traumatic injury to the hollow viscera. The appearance of the stomach is normal. No pathologic dilatation of small bowel or colon. 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 Normal appendix. Vascular/Lymphatic: No evidence of significant acute traumatic injury to the abdominal aorta or major arteries/veins of the abdomen or pelvis. Aortic atherosclerosis, without evidence of aneurysm or dissection  in the abdominal or pelvic vasculature. No lymphadenopathy noted in the abdomen or pelvis. Reproductive: Prostate gland and seminal vesicles are largely obscured by beam hardening artifact from the patient's left hip arthroplasty. Other: No high attenuation fluid collection noted within the peritoneal cavity or retroperitoneum to suggest significant posttraumatic hemorrhage. No significant volume of ascites. No pneumoperitoneum. Musculoskeletal: No acute displaced fractures or aggressive appearing  lytic or blastic lesions are noted in the visualized portions of the skeleton. Dedicated imaging of the lumbar spine demonstrates a chronic appearing compression fracture of L1 with 25% loss of anterior vertebral body height. Status post left hip arthroplasty. IMPRESSION: 1. Multiple acute right-sided rib fractures. Most of these are nondisplaced as detailed above, however, there is a displaced fracture of the posterolateral aspect of the right seventh rib. This is associated with a trace volume of right-sided pleural fluid (not a hemothorax at this time) or thickening, but no pneumothorax noted. 2. No other evidence of significant acute traumatic injury to the abdomen or pelvis. 3. Cardiomegaly with biatrial dilatation. Notably, there is a small filling defect in the tip of the left atrial appendage concerning for left atrial appendage thrombus. If present, this would place the patient at risk for systemic embolization. Further evaluation with nonemergent transesophageal echocardiography is recommended in the near future if clinically appropriate. 4. Indeterminate lesion in the lateral aspect of the lower pole of the left kidney. This may simply represent a proteinaceous/hemorrhagic cysts, however, further evaluation with nonemergent abdominal MRI with and without IV gadolinium is recommended in the near future to exclude neoplasm. 5. Aortic atherosclerosis, in addition to left main and 3 vessel coronary artery disease. Status post median sternotomy for CABG including LIMA to the LAD. 6. There are calcifications of the aortic valve and mitral annulus. Echocardiographic correlation for evaluation of potential valvular dysfunction may be warranted if clinically indicated. Multiple tiny 1-2 mm pulmonary nodules scattered throughout the periphery of the lungs bilaterally, nonspecific, but statistically likely benign areas of mucoid impaction within terminal bronchioles. No follow-up needed if patient is low-risk (and  has no known or suspected primary neoplasm). Non-contrast chest CT can be considered in 12 months if patient is high-risk. This recommendation follows the consensus statement: Guidelines for Management of Incidental Pulmonary Nodules Detected on CT Images: From the Fleischner Society 2017; Radiology 2017; 284:228-243. 7. Additional incidental findings, as above. Electronically Signed   By: Trudie Reed M.D.   On: 04/14/2021 07:03   CT L-SPINE NO CHARGE  Result Date: 04/14/2021 CLINICAL DATA:  80 year old male with history of trauma from a fall. Back pain. EXAM: CT CHEST, ABDOMEN, AND PELVIS WITH CONTRAST CT OF THE THORACIC SPINE WITHOUT CONTRAST CT OF THE LUMBAR SPINE WITHOUT CONTRAST TECHNIQUE: Multidetector CT imaging of the chest, abdomen and pelvis was performed following the standard protocol during bolus administration of intravenous contrast. Dedicated multiplanar reconstructions of the thoracic and lumbar spine were also generated for evaluation. CONTRAST:  75mL OMNIPAQUE IOHEXOL 300 MG/ML  SOLN COMPARISON:  Thoracic spine CT 12/19/2016. FINDINGS: CT CHEST FINDINGS Cardiovascular: No abnormal high attenuation fluid within the mediastinum to suggest posttraumatic mediastinal hematoma. No evidence of posttraumatic aortic dissection/transection. Heart size is enlarged with biatrial dilatation. Notably, there is a filling defect in the tip of the left atrial appendage (axial image 29 of series 2), concerning for thrombus. There is no significant pericardial fluid, thickening or pericardial calcification. There is aortic atherosclerosis, as well as atherosclerosis of the great vessels of the mediastinum and the coronary arteries,  including calcified atherosclerotic plaque in the left main, left anterior descending, left circumflex and right coronary arteries. Status post median sternotomy for CABG including LIMA to the LAD. Thickening calcification of the aortic valve. Calcifications of the mitral  annulus. Mediastinum/Nodes: No pathologically enlarged mediastinal or hilar lymph nodes. Esophagus is unremarkable in appearance. No axillary lymphadenopathy. Lungs/Pleura: No pneumothorax. No acute consolidative airspace disease. Trace amount of pleural fluid and/or thickening in the posterolateral aspect of the right hemithorax adjacent to rib fractures (discussed below). No frank hemothorax. No left pleural fluid collection. A few scattered 1-2 mm pulmonary nodules are noted throughout the periphery of the lungs bilaterally, nonspecific. Musculoskeletal: Displaced acute fracture of the posterolateral aspect of the right seventh rib. Nondisplaced acute fracture of the posterior aspect of the right eighth rib. Old healed fracture of the posterolateral right eighth rib. Probable nondisplaced fracture of the lateral aspect of the right eighth rib. Acute nondisplaced fracture of the posterior right ninth rib. Old healed fracture of the posterolateral right ninth rib. Possible nondisplaced fracture of the posterior right tenth rib. Rod and screw fixation hardware in place from T6-T11, with no evidence of hardware fracture or loosening. No acute displaced fractures of the thoracic spine. Mild compression of T2 vertebral body with 10% loss of anterior vertebral body height which appears to be chronic. CT ABDOMEN PELVIS FINDINGS Hepatobiliary: No evidence of acute traumatic injury to the liver. No suspicious cystic or solid hepatic lesions. No intra or extrahepatic biliary ductal dilatation. Gallbladder is normal in appearance. Pancreas: No evidence of acute traumatic injury to the pancreas. No pancreatic mass. No pancreatic ductal dilatation. No pancreatic or peripancreatic fluid collections or inflammatory changes. Spleen: No evidence of acute traumatic injury to the spleen. Unremarkable. Adrenals/Urinary Tract: No evidence of acute traumatic injury to either kidney or adrenal gland. Multifocal cortical thinning in the  kidneys bilaterally, likely sequela of prior infection or infarcts. In the anterolateral aspect of the lower pole of the left kidney (axial image 72 of series 2). There is an exophytic 4.0 x 2.7 cm intermediate attenuation (40 Hounsfield unit) lesion which is incompletely characterized on today's examination. Calcifications in both renal hila appear to be vascular. Bilateral adrenal glands are normal in appearance. No hydroureteronephrosis. Urinary bladder is partially obscured by extensive beam hardening artifact from the patient's left hip arthroplasty. With this limitation in mind, the urinary bladder appears intact and normal in appearance. Stomach/Bowel: No definitive evidence of significant acute traumatic injury to the hollow viscera. The appearance of the stomach is normal. No pathologic dilatation of small bowel or colon. 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 Normal appendix. Vascular/Lymphatic: No evidence of significant acute traumatic injury to the abdominal aorta or major arteries/veins of the abdomen or pelvis. Aortic atherosclerosis, without evidence of aneurysm or dissection in the abdominal or pelvic vasculature. No lymphadenopathy noted in the abdomen or pelvis. Reproductive: Prostate gland and seminal vesicles are largely obscured by beam hardening artifact from the patient's left hip arthroplasty. Other: No high attenuation fluid collection noted within the peritoneal cavity or retroperitoneum to suggest significant posttraumatic hemorrhage. No significant volume of ascites. No pneumoperitoneum. Musculoskeletal: No acute displaced fractures or aggressive appearing lytic or blastic lesions are noted in the visualized portions of the skeleton. Dedicated imaging of the lumbar spine demonstrates a chronic appearing compression fracture of L1 with 25% loss of anterior vertebral body height. Status post left  hip arthroplasty. IMPRESSION: 1. Multiple acute  right-sided rib fractures. Most of these are nondisplaced as detailed above, however, there is a displaced fracture of the posterolateral aspect of the right seventh rib. This is associated with a trace volume of right-sided pleural fluid (not a hemothorax at this time) or thickening, but no pneumothorax noted. 2. No other evidence of significant acute traumatic injury to the abdomen or pelvis. 3. Cardiomegaly with biatrial dilatation. Notably, there is a small filling defect in the tip of the left atrial appendage concerning for left atrial appendage thrombus. If present, this would place the patient at risk for systemic embolization. Further evaluation with nonemergent transesophageal echocardiography is recommended in the near future if clinically appropriate. 4. Indeterminate lesion in the lateral aspect of the lower pole of the left kidney. This may simply represent a proteinaceous/hemorrhagic cysts, however, further evaluation with nonemergent abdominal MRI with and without IV gadolinium is recommended in the near future to exclude neoplasm. 5. Aortic atherosclerosis, in addition to left main and 3 vessel coronary artery disease. Status post median sternotomy for CABG including LIMA to the LAD. 6. There are calcifications of the aortic valve and mitral annulus. Echocardiographic correlation for evaluation of potential valvular dysfunction may be warranted if clinically indicated. Multiple tiny 1-2 mm pulmonary nodules scattered throughout the periphery of the lungs bilaterally, nonspecific, but statistically likely benign areas of mucoid impaction within terminal bronchioles. No follow-up needed if patient is low-risk (and has no known or suspected primary neoplasm). Non-contrast chest CT can be considered in 12 months if patient is high-risk. This recommendation follows the consensus statement: Guidelines for Management of Incidental Pulmonary Nodules Detected  on CT Images: From the Fleischner Society 2017; Radiology 2017; [id number]. 7. Additional incidental findings, as above. Electronically Signed   By: Trudie Reed M.D.   On: 04/14/2021 07:03   DG Hip Unilat  With Pelvis 2-3 Views Right  Result Date: 04/14/2021 CLINICAL DATA:  Hip pain, fall EXAM: DG HIP (WITH OR WITHOUT PELVIS) 2-3V RIGHT COMPARISON:  None. FINDINGS: The osseous structures appear diffusely demineralized which may limit detection of small or nondisplaced fractures. Bones of the pelvis appear intact and congruent. Arcuate lines are contiguous. 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 Additional degenerative changes in the lower lumbar spine and bilateral SI joints. Enthesopathic changes noted about the pelvis. IMPRESSION: No discernible acute fracture or traumatic osseous injury. Prior left hip arthroplasty in expected positioning. Electronically Signed   By: Kreg Shropshire M.D.   On: 04/14/2021 01:01     EKG: I have personally reviewed.  Seems to be A. fib, QTC 442, PVC, Q waves in lead III  Assessment/Plan Principal Problem:   Fall at home, initial encounter Active Problems:   CAD (coronary artery disease)   Chronic atrial fibrillation (HCC)   Diabetes mellitus without complication (HCC)   HTN (hypertension)   Hyperlipidemia   Seizure disorder (HCC)   Stroke (HCC)   Dementia (HCC)   Right rib fracture   Renal lesion   Fall at home, initial encounter and right rib fracture: CT of head and C-spine negative for acute injury. Pt has multiple acute right-sided rib fracture. Will focus on pain control.  Daughter would like to limit narcotic pain medication use.  -will place in med-surg bed for obs -will d/c morphine -will keep prn percocet,  prn  Tylenol and as needed IV ketorolac -As needed Robaxin -Incentive spirometry  Possible left atrial appendage thrombus: CT scan showed a small filling defect in the tip of the left atrial appendage concerning for left atrial appendage thrombus. Dr. Lady GaryFath of card is consulted. Per Dr. Lady GaryFath, TEE is not good test for this, and TEE will not change management since pt is already on Eliquis for A fib. He recommended to continue Eliquis. This plan is discussed with pt's daughter.  -will continue Eliquis  CAD (coronary artery disease) -Continue metoprolol, Lipitor  Chronic atrial fibrillation (HCC) -Continue Eliquis and metoprolol  Diabetes mellitus without complication [facility](HCC): Recent A1c 6.3, well controlled.  Patient is taking metformin at home -Sliding scale insulin  HTN (hypertension) -IV hydralazine as needed -Metoprolol, ramipril, Lasix  Hyperlipidemia -Lipitor  Seizure disorder (HCC) -Seizure precaution -When necessary Ativan for seizure -continue home Keppra   Stroke (HCC) -lipitor -pt is on Eliquis for A fib  Dementia (HCC) -continue home Exelon, Seroquel  Renal lesion:  CT-scan showed am indeterminate lesion in the lateral aspect of the lower pole of the left kidney. -Follow-up with PCP      DVT ppx: on Eliquis Code Status: Full code Family Communication:  Yes, patient's  daughter   at bed side Disposition Plan:  Anticipate discharge back to previous environment Consults called:  Dr. Lady GaryFath of card Admission status and Level of care: Med-Surg:   obs   Status is: Observation  The patient remains OBS appropriate and will d/c before 2 midnights.  Dispo: The patient is from: Home              Anticipated d/c is to: Home              Patient currently is not medically stable to d/c.   Difficult to place patient No         Date of Service 04/14/2021    Lorretta HarpXilin Aliviana Burdell [facility]   If 7PM-7AM, please contact night-coverage [email or web address] 04/14/2021,  5:26 PM

## 2021-04-14 NOTE — ED Provider Notes (Signed)
[facility] Emergency Department Provider Note  ____________________________________________  Time seen: Approximately 4:19 AM  I have reviewed the triage vital signs and the nursing notes.   HISTORY  Chief Complaint Fall   HPI Ian King is a 80 y.o. male with a history of dementia, A. fib on Eliquis, diabetes, hypertension, CVA, CAD, back trauma status post spinal surgery who presents after mechanical fall.  History is gathered from patient's wife is at bedside.  According to her she had turned off the air conditioning earlier today and the house was getting hot.  She got up from the den and went to turn on the air conditioning.  While she was doing that the patient try to get up without assistance and fell from standing position.  The wife reports the patient usually requires assistance to get up and also his walker.  She is not sure if he hit his head.  Patient reports that he did not hit his head but his only complaint at this time is right flank pain.  He denies headache or neck pain, upper back.,  Chest pain, abdominal pain, extremity pain.  His pain on his right flank is worse with movement and palpation.  He rates the pain as severe and sharp.   Past Medical History:  Diagnosis Date   A-fib (HCC)    Diabetes mellitus without complication (HCC)    Hypertension     Patient Active Problem List   Diagnosis Date Noted   Gout flare 01/21/2021   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/17/2021   AMS (altered mental status) 01/16/2021   Raynaud phenomenon 03/22/2020   Chronic venous insufficiency 03/22/2020   B12 deficiency 03/10/2020   Acute ischemic multifocal anterior circulation stroke (HCC) 03/09/2020   Pseudophakia of left eye 07/02/2018   Obesity, Class I, BMI 30-34.9 02/15/2018   Closed displaced fracture of distal phalanx of left middle finger 03/08/2017   Closed fracture of distal end of left radius with routine healing 01/16/2017   Closed fracture of  nasal bone with routine healing 01/12/2017   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   Closed fracture of dorsal (thoracic) vertebra (HCC) 12/19/2016   Fall 12/19/2016   Fracture of left radius 12/19/2016   Primary osteoarthritis of right knee 10/14/2015   Acute pain of right knee 09/09/2015   Aftercare following joint replacement surgery 07/31/2015   Closed nondisplaced fracture of lateral condyle of femur (HCC) 07/31/2015   Bilateral edema of lower extremity 06/26/2014   Chronic atrial fibrillation (HCC) 07/13/2013   Wound disruption, post-op, skin 10/11/2012   Cellulitis and abscess 10/05/2012   Hyperlipidemia 08/17/2012   Osteoarthritis 08/17/2012   Seizure disorder (HCC) 08/16/2012   Hip pain 12/12/2011   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 Authorizing Provider  acetaminophen (TYLENOL) 325 MG tablet Take 2 tablets (650 mg total) by mouth every 6 (six) hours as needed for mild pain, fever or headache. 01/22/21   Pennie Banter, DO  apixaban (ELIQUIS) 5 MG TABS tablet Take 1 tablet (5 mg total)  by mouth 2 (two) times daily. 01/22/21   Pennie Banter, DO  atorvastatin (LIPITOR) 20 MG tablet Take 4 tablets (80 mg total) by mouth at bedtime. 01/22/21   Pennie Banter, DO  bisacodyl (DULCOLAX) 10 MG suppository Place 1 suppository (10 mg total) rectally daily as needed for severe constipation. 01/22/21   Pennie Banter, DO  bisacodyl (DULCOLAX) 5 MG EC tablet Take 1 tablet (5 mg total) by mouth daily as needed for moderate constipation. 01/22/21   Pennie Banter, DO  cholecalciferol (VITAMIN D3) 25 MCG (1000  UNIT) tablet Take 1,000 Units by mouth daily.    [provider]  feeding supplement (ENSURE ENLIVE / ENSURE PLUS) LIQD Take 237 mLs by mouth 2 (two) times daily between meals. 01/23/21   Pennie Banter, DO  furosemide (LASIX) 20 MG tablet Take 20 mg by mouth 4 (four) times a week.     [provider]  hydrocerin (EUCERIN) CREA Apply 1 application topically 2 (two) times daily. 01/22/21   Pennie Banter, DO  ketoconazole (NIZORAL) 2 % shampoo Apply 1 application topically 2 (two) times a week.    [provider]  levETIRAcetam (KEPPRA) 1000 MG tablet 1,000 mg 2 (two) times daily. 03/10/20   [provider]  metFORMIN (GLUCOPHAGE-XR) 500 MG 24 hr tablet Take 1,000 mg by mouth in the morning and at bedtime.     [provider]  metoprolol tartrate (LOPRESSOR) 25 MG tablet Take 25 mg by mouth 2 (two) times daily.    [provider]  Multiple Vitamin (MULTIVITAMIN WITH MINERALS) TABS tablet Take 1 tablet by mouth daily. 01/23/21   Pennie Banter, DO  Omega-3 Fatty Acids (FISH OIL) 1000 MG CAPS Take 1,000 mg by mouth in the morning and at bedtime.    [provider]  polyethylene glycol (MIRALAX / GLYCOLAX) 17 g packet Take 17 g by mouth daily. 01/23/21   Pennie Banter, DO  QUEtiapine (SEROQUEL) 25 MG tablet Take 1 tablet (25 mg total) by mouth 2 (two) times daily as needed (agitation). 01/22/21   Pennie Banter, DO  QUEtiapine (SEROQUEL) 50 MG tablet Take 1 tablet (50 mg total) by mouth at bedtime. 01/22/21   Esaw Grandchild A, DO  ramipril (ALTACE) 10 MG capsule Take 10 mg by mouth 2 (two) times daily.    [provider]  rivastigmine (EXELON) 1.5 MG capsule Take 1 capsule (1.5 mg total) by mouth 2 (two) times daily. 01/22/21   Pennie Banter, DO  senna-docusate (SENOKOT-S) 8.6-50 MG tablet Take 1 tablet by mouth 2 (two) times daily. 01/22/21   Esaw Grandchild A, DO  triamcinolone cream (KENALOG) 0.1 % Apply topically 2  (two) times daily. 01/22/21   Pennie Banter, DO    Allergies Doxycycline and Lovastatin  Family History  Problem Relation Age of Onset   Heart disease Mother    Heart disease Father    Heart disease Sister    Leukemia Brother    Heart disease Brother    Diabetes Brother     Social History Social History   Tobacco Use   Smoking status: Former    Pack years: 0.00    Types: Cigars    Quit date: 01/16/2017    Years since quitting: 4.2   Smokeless tobacco: Never  Substance Use Topics   Alcohol use: Never   Drug use: Never    Review of Systems  Constitutional: Negative for fever. Eyes: Negative for visual  changes. ENT: Negative for facial injury or neck injury Cardiovascular: Negative for chest injury. Respiratory: Negative for shortness of breath. Negative for chest wall injury. Gastrointestinal: Negative for abdominal pain or injury. Genitourinary: Negative for dysuria. + R flank pain Musculoskeletal: Negative for back injury, negative for arm or leg pain. Skin: Negative for laceration/abrasions. Neurological: Negative for head injury.   ____________________________________________   PHYSICAL EXAM:  VITAL SIGNS: ED Triage Vitals [04/13/21 2254]  Enc Vitals Group     BP (!) 211/84     Pulse Rate 64     Resp 16     Temp 98.3 F (36.8 C)     Temp Source Oral     SpO2 98 %     Weight      Height      Head Circumference      Peak Flow      Pain Score      Pain Loc      Pain Edu?      Excl. in GC?     Full spinal precautions maintained throughout the trauma exam. Constitutional: Alert and oriented. No acute distress. Does not appear intoxicated. HEENT Head: Normocephalic and atraumatic. Face: No facial bony tenderness. Stable midface Ears: No hemotympanum bilaterally. No Battle sign Eyes: No eye injury. PERRL. No raccoon eyes Nose: Nontender. No epistaxis. No rhinorrhea Mouth/Throat: Mucous membranes are moist. No oropharyngeal blood. No dental  injury. Airway patent without stridor. Normal voice. Neck: no C-collar. No midline c-spine tenderness.  Cardiovascular: Normal rate, regular rhythm. Normal and symmetric distal pulses are present in all extremities. Pulmonary/Chest: Chest wall is stable and nontender to palpation/compression. Normal respiratory effort. Breath sounds are normal. No crepitus.  Abdominal: Soft, nontender, non distended. Musculoskeletal: Patient is tender to palpation on the right flank with no bruising or deformities.  Nontender with normal full range of motion in all extremities. No deformities. No thoracic or lumbar midline spinal tenderness. Pelvis is stable. Skin: Skin is warm, dry and intact. No abrasions or contutions. Psychiatric: Speech and behavior are appropriate. Neurological: Normal speech and language. Moves all extremities to command. No gross focal neurologic deficits are appreciated.  Glascow Coma Score: 4 - Opens eyes on own 6 - Follows simple motor commands 5 - Alert and oriented GCS: 15   ____________________________________________   LABS (all labs ordered are listed, but only abnormal results are displayed)  Labs Reviewed  BASIC METABOLIC PANEL - Abnormal; Notable for the following components:      Result Value   Glucose, Bld 120 (*)    Calcium 10.4 (*)    All other components within normal limits  CBC - Abnormal; Notable for the following components:   RBC 3.77 (*)    Hemoglobin 12.0 (*)    HCT 36.8 (*)    Platelets 142 (*)    All other components within normal limits  URINALYSIS, COMPLETE (UACMP) WITH MICROSCOPIC   ____________________________________________  EKG  ED ECG REPORT I, Nita Sickle, the attending physician, personally viewed and interpreted this ECG.  Sinus rhythm with rate of 72, frequent PVCs, no ST elevations or depressions. ____________________________________________  RADIOLOGY  I have personally reviewed the images performed during this visit  and I agree with the Radiologist's read.   Interpretation by Radiologist:  CT HEAD WO CONTRAST  Result Date: 04/14/2021 CLINICAL DATA:  Fall EXAM: CT HEAD WITHOUT CONTRAST CT CERVICAL SPINE WITHOUT CONTRAST TECHNIQUE: Multidetector CT imaging of the head and cervical spine was performed following the standard protocol without  intravenous contrast. Multiplanar CT image reconstructions of the cervical spine were also generated. COMPARISON:  None. FINDINGS: CT HEAD FINDINGS Brain: There is no mass, hemorrhage or extra-axial collection. There is generalized atrophy without lobar predilection. There is hypoattenuation of the periventricular white matter, most commonly indicating chronic ischemic microangiopathy. Old small vessel infarct of the right caudate head. Vascular: No abnormal hyperdensity of the major intracranial arteries or dural venous sinuses. No intracranial atherosclerosis. Skull: The visualized skull base, calvarium and extracranial soft tissues are normal. Sinuses/Orbits: No fluid levels or advanced mucosal thickening of the visualized paranasal sinuses. No mastoid or middle ear effusion. The orbits are normal. CT CERVICAL SPINE FINDINGS Alignment: No static subluxation. Facets are aligned. Occipital condyles are normally positioned. Skull base and vertebrae: Ankylosis of the entire length of the cervical spine. No acute fracture. Soft tissues and spinal canal: No prevertebral fluid or swelling. No visible canal hematoma. Disc levels: No advanced spinal canal or neural foraminal stenosis. Upper chest: No pneumothorax, pulmonary nodule or pleural effusion. Other: Normal visualized paraspinal cervical soft tissues. IMPRESSION: 1. No acute abnormality the head or cervical spine. 2. Diffuse cervical ankylosis. 3. 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 Electronically Signed   By: Deatra Robinson M.D.   On: 04/14/2021 01:37   CT CERVICAL SPINE WO CONTRAST  Result Date:  04/14/2021 CLINICAL DATA:  Fall EXAM: CT HEAD WITHOUT CONTRAST CT CERVICAL SPINE WITHOUT CONTRAST TECHNIQUE: Multidetector CT imaging of the head and cervical spine was performed following the standard protocol without intravenous contrast. Multiplanar CT image reconstructions of the cervical spine were also generated. COMPARISON:  None. FINDINGS: CT HEAD FINDINGS Brain: There is no mass, hemorrhage or extra-axial collection. There is generalized atrophy without lobar predilection. There is hypoattenuation of the periventricular white matter, most commonly indicating chronic ischemic microangiopathy. Old small vessel infarct of the right caudate head. Vascular: No abnormal hyperdensity of the major intracranial arteries or dural venous sinuses. No intracranial atherosclerosis. Skull: The visualized skull base, calvarium and extracranial soft tissues are normal. Sinuses/Orbits: No fluid levels or advanced mucosal thickening of the visualized paranasal sinuses. No mastoid or middle ear effusion. The orbits are normal. CT CERVICAL SPINE FINDINGS Alignment: No static subluxation. Facets are aligned. Occipital condyles are normally positioned. Skull base and vertebrae: Ankylosis of the entire length of the cervical spine. No acute fracture. Soft tissues and spinal canal: No prevertebral fluid or swelling. No visible canal hematoma. Disc levels: No advanced spinal canal or neural foraminal stenosis. Upper chest: No pneumothorax, pulmonary nodule or pleural effusion. Other: Normal visualized paraspinal cervical soft tissues. IMPRESSION: 1. No acute abnormality the head or cervical spine. 2. Diffuse cervical ankylosis. 3. 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 Electronically Signed   By: Deatra Robinson M.D.   On: 04/14/2021 01:37   CT CHEST ABDOMEN PELVIS W CONTRAST  Result Date: 04/14/2021 CLINICAL DATA:  80 year old male with history of trauma from a fall. Back pain. EXAM: CT CHEST, ABDOMEN,  AND PELVIS WITH CONTRAST CT OF THE THORACIC SPINE WITHOUT CONTRAST CT OF THE LUMBAR SPINE WITHOUT CONTRAST TECHNIQUE: Multidetector CT imaging of the chest, abdomen and pelvis was performed following the standard protocol during bolus administration of intravenous contrast. Dedicated multiplanar reconstructions of the thoracic and lumbar spine were also generated for evaluation. CONTRAST:  75mL OMNIPAQUE IOHEXOL 300 MG/ML  SOLN COMPARISON:  Thoracic spine CT 12/19/2016. FINDINGS: CT CHEST FINDINGS Cardiovascular: No abnormal high attenuation fluid within the mediastinum to suggest posttraumatic mediastinal  hematoma. No evidence of posttraumatic aortic dissection/transection. Heart size is enlarged with biatrial dilatation. Notably, there is a filling defect in the tip of the left atrial appendage (axial image 29 of series 2), concerning for thrombus. There is no significant pericardial fluid, thickening or pericardial calcification. There is aortic atherosclerosis, as well as atherosclerosis of the great vessels of the mediastinum and the coronary arteries, including calcified atherosclerotic plaque in the left main, left anterior descending, left circumflex and right coronary arteries. Status post median sternotomy for CABG including LIMA to the LAD. Thickening calcification of the aortic valve. Calcifications of the mitral annulus. Mediastinum/Nodes: No pathologically enlarged mediastinal or hilar lymph nodes. Esophagus is unremarkable in appearance. No axillary lymphadenopathy. Lungs/Pleura: No pneumothorax. No acute consolidative airspace disease. Trace amount of pleural fluid and/or thickening in the posterolateral aspect of the right hemithorax adjacent to rib fractures (discussed below). No frank hemothorax. No left pleural fluid collection. A few scattered 1-2 mm pulmonary nodules are noted throughout the periphery of the lungs bilaterally, nonspecific. Musculoskeletal: Displaced acute fracture of the  posterolateral aspect of the right seventh rib. Nondisplaced acute fracture of the posterior aspect of the right eighth rib. Old healed fracture of the posterolateral right eighth rib. Probable nondisplaced fracture of the lateral aspect of the right eighth rib. Acute nondisplaced fracture of the posterior right ninth rib. Old healed fracture of the posterolateral right ninth rib. Possible nondisplaced fracture of the posterior right tenth rib. Rod and screw fixation hardware in place from T6-T11, with no evidence of hardware fracture or loosening. No acute displaced fractures of the thoracic spine. Mild compression of T2 vertebral body with 10% loss of anterior vertebral body height which appears to be chronic. CT ABDOMEN PELVIS FINDINGS Hepatobiliary: No evidence of acute traumatic injury to the liver. No suspicious cystic or solid hepatic lesions. No intra or extrahepatic biliary ductal dilatation. Gallbladder is normal in appearance. Pancreas: No evidence of acute traumatic injury to the pancreas. No pancreatic mass. No pancreatic ductal dilatation. No pancreatic or peripancreatic fluid collections or inflammatory changes. Spleen: No evidence of acute traumatic injury to the spleen. Unremarkable. Adrenals/Urinary Tract: No evidence of acute traumatic injury to either kidney or adrenal gland. Multifocal cortical thinning in the kidneys bilaterally, likely sequela of prior infection or infarcts. In the anterolateral aspect of the lower pole of the left kidney (axial image 72 of series 2). There is an exophytic 4.0 x 2.7 cm intermediate attenuation (40 Hounsfield unit) lesion which is incompletely characterized on today's examination. Calcifications in both renal hila appear to be vascular. Bilateral adrenal glands are normal in appearance. No hydroureteronephrosis. Urinary bladder is partially obscured by extensive beam hardening artifact from the patient's left hip arthroplasty. With this limitation in mind, the  urinary bladder appears intact and normal in appearance. Stomach/Bowel: No definitive evidence of significant acute traumatic injury to the hollow viscera. The appearance of the stomach is normal. No pathologic dilatation of small bowel or colon. 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 Normal appendix. Vascular/Lymphatic: No evidence of significant acute traumatic injury to the abdominal aorta or major arteries/veins of the abdomen or pelvis. Aortic atherosclerosis, without evidence of aneurysm or dissection in the abdominal or pelvic vasculature. No lymphadenopathy noted in the abdomen or pelvis. Reproductive: Prostate gland and seminal vesicles are largely obscured by beam hardening artifact from the patient's left hip arthroplasty. Other: No high attenuation fluid collection noted within the peritoneal cavity or  retroperitoneum to suggest significant posttraumatic hemorrhage. No significant volume of ascites. No pneumoperitoneum. Musculoskeletal: No acute displaced fractures or aggressive appearing lytic or blastic lesions are noted in the visualized portions of the skeleton. Dedicated imaging of the lumbar spine demonstrates a chronic appearing compression fracture of L1 with 25% loss of anterior vertebral body height. Status post left hip arthroplasty. IMPRESSION: 1. Multiple acute right-sided rib fractures. Most of these are nondisplaced as detailed above, however, there is a displaced fracture of the posterolateral aspect of the right seventh rib. This is associated with a trace volume of right-sided pleural fluid (not a hemothorax at this time) or thickening, but no pneumothorax noted. 2. No other evidence of significant acute traumatic injury to the abdomen or pelvis. 3. Cardiomegaly with biatrial dilatation. Notably, there is a small filling defect in the tip of the left atrial appendage concerning for left atrial  appendage thrombus. If present, this would place the patient at risk for systemic embolization. Further evaluation with nonemergent transesophageal echocardiography is recommended in the near future if clinically appropriate. 4. Indeterminate lesion in the lateral aspect of the lower pole of the left kidney. This may simply represent a proteinaceous/hemorrhagic cysts, however, further evaluation with nonemergent abdominal MRI with and without IV gadolinium is recommended in the near future to exclude neoplasm. 5. Aortic atherosclerosis, in addition to left main and 3 vessel coronary artery disease. Status post median sternotomy for CABG including LIMA to the LAD. 6. There are calcifications of the aortic valve and mitral annulus. Echocardiographic correlation for evaluation of potential valvular dysfunction may be warranted if clinically indicated. Multiple tiny 1-2 mm pulmonary nodules scattered throughout the periphery of the lungs bilaterally, nonspecific, but statistically likely benign areas of mucoid impaction within terminal bronchioles. No follow-up needed if patient is low-risk (and has no known or suspected primary neoplasm). Non-contrast chest CT can be considered in 12 months if patient is high-risk. This recommendation follows the consensus statement: Guidelines for Management of Incidental Pulmonary Nodules Detected on CT Images: From the Fleischner Society 2017; Radiology 2017; 284:228-243. 7. Additional incidental findings, as above. Electronically Signed   By: Trudie Reed M.D.   On: 04/14/2021 07:03   CT T-SPINE NO CHARGE  Result Date: 04/14/2021 CLINICAL DATA:  80 year old male with history of trauma from a fall. Back pain. EXAM: CT CHEST, ABDOMEN, AND PELVIS WITH CONTRAST CT OF THE THORACIC SPINE WITHOUT CONTRAST CT OF THE LUMBAR SPINE WITHOUT CONTRAST TECHNIQUE: Multidetector CT imaging of the chest, abdomen and pelvis was performed following the standard protocol during bolus  administration of intravenous contrast. Dedicated multiplanar reconstructions of the thoracic and lumbar spine were also generated for evaluation. CONTRAST:  38mL OMNIPAQUE IOHEXOL 300 MG/ML  SOLN COMPARISON:  Thoracic spine CT 12/19/2016. FINDINGS: CT CHEST FINDINGS Cardiovascular: No abnormal high attenuation fluid within the mediastinum to suggest posttraumatic mediastinal hematoma. No evidence of posttraumatic aortic dissection/transection. Heart size is enlarged with biatrial dilatation. Notably, there is a filling defect in the tip of the left atrial appendage (axial image 29 of series 2), concerning for thrombus. There is no significant pericardial fluid, thickening or pericardial calcification. There is aortic atherosclerosis, as well as atherosclerosis of the great vessels of the mediastinum and the coronary arteries, including calcified atherosclerotic plaque in the left main, left anterior descending, left circumflex and right coronary arteries. Status post median sternotomy for CABG including LIMA to the LAD. Thickening calcification of the aortic valve. Calcifications of the mitral annulus. Mediastinum/Nodes: No pathologically enlarged mediastinal or  hilar lymph nodes. Esophagus is unremarkable in appearance. No axillary lymphadenopathy. Lungs/Pleura: No pneumothorax. No acute consolidative airspace disease. Trace amount of pleural fluid and/or thickening in the posterolateral aspect of the right hemithorax adjacent to rib fractures (discussed below). No frank hemothorax. No left pleural fluid collection. A few scattered 1-2 mm pulmonary nodules are noted throughout the periphery of the lungs bilaterally, nonspecific. Musculoskeletal: Displaced acute fracture of the posterolateral aspect of the right seventh rib. Nondisplaced acute fracture of the posterior aspect of the right eighth rib. Old healed fracture of the posterolateral right eighth rib. Probable nondisplaced fracture of the lateral aspect of  the right eighth rib. Acute nondisplaced fracture of the posterior right ninth rib. Old healed fracture of the posterolateral right ninth rib. Possible nondisplaced fracture of the posterior right tenth rib. Rod and screw fixation hardware in place from T6-T11, with no evidence of hardware fracture or loosening. No acute displaced fractures of the thoracic spine. Mild compression of T2 vertebral body with 10% loss of anterior vertebral body height which appears to be chronic. CT ABDOMEN PELVIS FINDINGS Hepatobiliary: No evidence of acute traumatic injury to the liver. No suspicious cystic or solid hepatic lesions. No intra or extrahepatic biliary ductal dilatation. Gallbladder is normal in appearance. Pancreas: No evidence of acute traumatic injury to the pancreas. No pancreatic mass. No pancreatic ductal dilatation. No pancreatic or peripancreatic fluid collections or inflammatory changes. Spleen: No evidence of acute traumatic injury to the spleen. Unremarkable. Adrenals/Urinary Tract: No evidence of acute traumatic injury to either kidney or adrenal gland. Multifocal cortical thinning in the kidneys bilaterally, likely sequela of prior infection or infarcts. In the anterolateral aspect of the lower pole of the left kidney (axial image 72 of series 2). There is an exophytic 4.0 x 2.7 cm intermediate attenuation (40 Hounsfield unit) lesion which is incompletely characterized on today's examination. Calcifications in both renal hila appear to be vascular. Bilateral adrenal glands are normal in appearance. No hydroureteronephrosis. Urinary bladder is partially obscured by extensive beam hardening artifact from the patient's left hip arthroplasty. With this limitation in mind, the urinary bladder appears intact and normal in appearance. Stomach/Bowel: No definitive evidence of significant acute traumatic injury to the hollow viscera. The appearance of the stomach is normal. No pathologic dilatation of small bowel or  colon. 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 Normal appendix. Vascular/Lymphatic: No evidence of significant acute traumatic injury to the abdominal aorta or major arteries/veins of the abdomen or pelvis. Aortic atherosclerosis, without evidence of aneurysm or dissection in the abdominal or pelvic vasculature. No lymphadenopathy noted in the abdomen or pelvis. Reproductive: Prostate gland and seminal vesicles are largely obscured by beam hardening artifact from the patient's left hip arthroplasty. Other: No high attenuation fluid collection noted within the peritoneal cavity or retroperitoneum to suggest significant posttraumatic hemorrhage. No significant volume of ascites. No pneumoperitoneum. Musculoskeletal: No acute displaced fractures or aggressive appearing lytic or blastic lesions are noted in the visualized portions of the skeleton. Dedicated imaging of the lumbar spine demonstrates a chronic appearing compression fracture of L1 with 25% loss of anterior vertebral body height. Status post left hip arthroplasty. IMPRESSION: 1. Multiple acute right-sided rib fractures. Most of these are nondisplaced as detailed above, however, there is a displaced fracture of the posterolateral aspect of the right seventh rib. This is associated with a trace volume of right-sided pleural fluid (not a hemothorax at this time) or thickening, but  no pneumothorax noted. 2. No other evidence of significant acute traumatic injury to the abdomen or pelvis. 3. Cardiomegaly with biatrial dilatation. Notably, there is a small filling defect in the tip of the left atrial appendage concerning for left atrial appendage thrombus. If present, this would place the patient at risk for systemic embolization. Further evaluation with nonemergent transesophageal echocardiography is recommended in the near future if clinically appropriate. 4. Indeterminate  lesion in the lateral aspect of the lower pole of the left kidney. This may simply represent a proteinaceous/hemorrhagic cysts, however, further evaluation with nonemergent abdominal MRI with and without IV gadolinium is recommended in the near future to exclude neoplasm. 5. Aortic atherosclerosis, in addition to left main and 3 vessel coronary artery disease. Status post median sternotomy for CABG including LIMA to the LAD. 6. There are calcifications of the aortic valve and mitral annulus. Echocardiographic correlation for evaluation of potential valvular dysfunction may be warranted if clinically indicated. Multiple tiny 1-2 mm pulmonary nodules scattered throughout the periphery of the lungs bilaterally, nonspecific, but statistically likely benign areas of mucoid impaction within terminal bronchioles. No follow-up needed if patient is low-risk (and has no known or suspected primary neoplasm). Non-contrast chest CT can be considered in 12 months if patient is high-risk. This recommendation follows the consensus statement: Guidelines for Management of Incidental Pulmonary Nodules Detected on CT Images: From the Fleischner Society 2017; Radiology 2017; 284:228-243. 7. Additional incidental findings, as above. Electronically Signed   By: Trudie Reedaniel  Entrikin M.D.   On: 04/14/2021 07:03   CT L-SPINE NO CHARGE  Result Date: 04/14/2021 CLINICAL DATA:  80 year old male with history of trauma from a fall. Back pain. EXAM: CT CHEST, ABDOMEN, AND PELVIS WITH CONTRAST CT OF THE THORACIC SPINE WITHOUT CONTRAST CT OF THE LUMBAR SPINE WITHOUT CONTRAST TECHNIQUE: Multidetector CT imaging of the chest, abdomen and pelvis was performed following the standard protocol during bolus administration of intravenous contrast. Dedicated multiplanar reconstructions of the thoracic and lumbar spine were also generated for evaluation. CONTRAST:  75mL OMNIPAQUE IOHEXOL 300 MG/ML  SOLN COMPARISON:  Thoracic spine CT 12/19/2016. FINDINGS: CT  CHEST FINDINGS Cardiovascular: No abnormal high attenuation fluid within the mediastinum to suggest posttraumatic mediastinal hematoma. No evidence of posttraumatic aortic dissection/transection. Heart size is enlarged with biatrial dilatation. Notably, there is a filling defect in the tip of the left atrial appendage (axial image 29 of series 2), concerning for thrombus. There is no significant pericardial fluid, thickening or pericardial calcification. There is aortic atherosclerosis, as well as atherosclerosis of the great vessels of the mediastinum and the coronary arteries, including calcified atherosclerotic plaque in the left main, left anterior descending, left circumflex and right coronary arteries. Status post median sternotomy for CABG including LIMA to the LAD. Thickening calcification of the aortic valve. Calcifications of the mitral annulus. Mediastinum/Nodes: No pathologically enlarged mediastinal or hilar lymph nodes. Esophagus is unremarkable in appearance. No axillary lymphadenopathy. Lungs/Pleura: No pneumothorax. No acute consolidative airspace disease. Trace amount of pleural fluid and/or thickening in the posterolateral aspect of the right hemithorax adjacent to rib fractures (discussed below). No frank hemothorax. No left pleural fluid collection. A few scattered 1-2 mm pulmonary nodules are noted throughout the periphery of the lungs bilaterally, nonspecific. Musculoskeletal: Displaced acute fracture of the posterolateral aspect of the right seventh rib. Nondisplaced acute fracture of the posterior aspect of the right eighth rib. Old healed fracture of the posterolateral right eighth rib. Probable nondisplaced fracture of the lateral aspect of the right eighth rib.  Acute nondisplaced fracture of the posterior right ninth rib. Old healed fracture of the posterolateral right ninth rib. Possible nondisplaced fracture of the posterior right tenth rib. Rod and screw fixation hardware in place from  T6-T11, with no evidence of hardware fracture or loosening. No acute displaced fractures of the thoracic spine. Mild compression of T2 vertebral body with 10% loss of anterior vertebral body height which appears to be chronic. CT ABDOMEN PELVIS FINDINGS Hepatobiliary: No evidence of acute traumatic injury to the liver. No suspicious cystic or solid hepatic lesions. No intra or extrahepatic biliary ductal dilatation. Gallbladder is normal in appearance. Pancreas: No evidence of acute traumatic injury to the pancreas. No pancreatic mass. No pancreatic ductal dilatation. No pancreatic or peripancreatic fluid collections or inflammatory changes. Spleen: No evidence of acute traumatic injury to the spleen. Unremarkable. Adrenals/Urinary Tract: No evidence of acute traumatic injury to either kidney or adrenal gland. Multifocal cortical thinning in the kidneys bilaterally, likely sequela of prior infection or infarcts. In the anterolateral aspect of the lower pole of the left kidney (axial image 72 of series 2). There is an exophytic 4.0 x 2.7 cm intermediate attenuation (40 Hounsfield unit) lesion which is incompletely characterized on today's examination. Calcifications in both renal hila appear to be vascular. Bilateral adrenal glands are normal in appearance. No hydroureteronephrosis. Urinary bladder is partially obscured by extensive beam hardening artifact from the patient's left hip arthroplasty. With this limitation in mind, the urinary bladder appears intact and normal in appearance. Stomach/Bowel: No definitive evidence of significant acute traumatic injury to the hollow viscera. The appearance of the stomach is normal. No pathologic dilatation of small bowel or colon. 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 Normal appendix. Vascular/Lymphatic: No evidence of significant acute traumatic injury to the abdominal aorta or  major arteries/veins of the abdomen or pelvis. Aortic atherosclerosis, without evidence of aneurysm or dissection in the abdominal or pelvic vasculature. No lymphadenopathy noted in the abdomen or pelvis. Reproductive: Prostate gland and seminal vesicles are largely obscured by beam hardening artifact from the patient's left hip arthroplasty. Other: No high attenuation fluid collection noted within the peritoneal cavity or retroperitoneum to suggest significant posttraumatic hemorrhage. No significant volume of ascites. No pneumoperitoneum. Musculoskeletal: No acute displaced fractures or aggressive appearing lytic or blastic lesions are noted in the visualized portions of the skeleton. Dedicated imaging of the lumbar spine demonstrates a chronic appearing compression fracture of L1 with 25% loss of anterior vertebral body height. Status post left hip arthroplasty. IMPRESSION: 1. Multiple acute right-sided rib fractures. Most of these are nondisplaced as detailed above, however, there is a displaced fracture of the posterolateral aspect of the right seventh rib. This is associated with a trace volume of right-sided pleural fluid (not a hemothorax at this time) or thickening, but no pneumothorax noted. 2. No other evidence of significant acute traumatic injury to the abdomen or pelvis. 3. Cardiomegaly with biatrial dilatation. Notably, there is a small filling defect in the tip of the left atrial appendage concerning for left atrial appendage thrombus. If present, this would place the patient at risk for systemic embolization. Further evaluation with nonemergent transesophageal echocardiography is recommended in the near future if clinically appropriate. 4. Indeterminate lesion in the lateral aspect of the lower pole of the left kidney. This may simply represent a proteinaceous/hemorrhagic cysts, however, further evaluation with nonemergent abdominal MRI with and without IV gadolinium is recommended in the near  future  to exclude neoplasm. 5. Aortic atherosclerosis, in addition to left main and 3 vessel coronary artery disease. Status post median sternotomy for CABG including LIMA to the LAD. 6. There are calcifications of the aortic valve and mitral annulus. Echocardiographic correlation for evaluation of potential valvular dysfunction may be warranted if clinically indicated. Multiple tiny 1-2 mm pulmonary nodules scattered throughout the periphery of the lungs bilaterally, nonspecific, but statistically likely benign areas of mucoid impaction within terminal bronchioles. No follow-up needed if patient is low-risk (and has no known or suspected primary neoplasm). Non-contrast chest CT can be considered in 12 months if patient is high-risk. This recommendation follows the consensus statement: Guidelines for Management of Incidental Pulmonary Nodules Detected on CT Images: From the Fleischner Society 2017; Radiology 2017; 284:228-243. 7. Additional incidental findings, as above. Electronically Signed   By: Trudie Reed M.D.   On: 04/14/2021 07:03   DG Hip Unilat  With Pelvis 2-3 Views Right  Result Date: 04/14/2021 CLINICAL DATA:  Hip pain, fall EXAM: DG HIP (WITH OR WITHOUT PELVIS) 2-3V RIGHT COMPARISON:  None. FINDINGS: The osseous structures appear diffusely demineralized which may limit detection of small or nondisplaced fractures. Bones of the pelvis appear intact and congruent. Arcuate lines are contiguous. 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 Additional degenerative changes in the lower lumbar spine and bilateral SI joints. Enthesopathic changes noted about the pelvis. IMPRESSION: No discernible acute fracture or traumatic osseous injury. Prior left hip arthroplasty in  expected positioning. Electronically Signed   By: Kreg Shropshire M.D.   On: 04/14/2021 01:01     ____________________________________________   PROCEDURES  Procedure(s) performed: None Procedures Critical Care performed:  None ____________________________________________   INITIAL IMPRESSION / ASSESSMENT AND PLAN / ED COURSE   80 y.o. male with a history of dementia, A. fib on Eliquis, diabetes, hypertension, CVA, CAD, back trauma status post spinal surgery who presents after mechanical fall.  Patient's only complaint is right flank pain where he is very tender to palpation, he has no midline spine tenderness, full painless range of motion of all joints, head is atraumatic, no chest pain or abdominal pain.  Patient does have a history of dementia therefore history and plan mostly discussed with his wife who is at bedside.  He is EKG shows no signs of dysrhythmias.  Lab work with no signs of dehydration, AKI, significant electrolyte derangements, anemia or sepsis.  Imaging studies all visualized by me and read by radiology.  Hip x-rays negative for fracture or dislocation.  CT head and cervical spine within normal limits.  CT thoracic spine, lumbar spine, abdomen pelvis pending.  CT done with contrast since patient is on Eliquis to evaluate for any evidence of active intraperitoneal or retroperitoneal bleed.  Patient's blood pressure is extremely elevated.  According to the wife he missed his evening dose of antihypertensive medications.  We will provide these medications here.  He is also in significant pain therefore will give 2 extra strength Tylenol.  Patient placed on telemetry for close monitoring of cardiorespiratory status.  Old medical records reviewed.  _________________________ 7:09 AM on 04/14/2021 ----------------------------------------- CT showing 7-9 right-sided rib fractures with no pneumothorax or hemothorax.  However based on patient's age and the fact that he is for rib fractures  will admit to the hospitalist for pain control  and pulmonary toilet.  There are several other incidental findings that will need further evaluation including concerns for possible left atrial thrombus, left kidney lesion, and several pulmonary nodules.  All these findings were discussed with patient's wife.  We discussed the hospitalist for admission.    ____________________________________________  Please note:  Patient was evaluated in Emergency Department today for the symptoms described in the history of present illness. Patient was evaluated in the context of the global COVID-19 pandemic, which necessitated consideration that the patient might be at risk for infection with the SARS-CoV-2 virus that causes COVID-19. Institutional protocols and algorithms that pertain to the evaluation of patients at risk for COVID-19 are in a state of rapid change based on information released by regulatory bodies including the CDC and federal and state organizations. These policies and algorithms were followed during the patient's care in the ED.  Some ED evaluations and interventions may be delayed as a result of limited staffing during the pandemic.   ____________________________________________   FINAL CLINICAL IMPRESSION(S) / ED DIAGNOSES   Final diagnoses:  Fall  Closed fracture of multiple ribs of right side, initial encounter      NEW MEDICATIONS STARTED DURING THIS VISIT:  ED Discharge Orders     None        Note:  This document was prepared using Dragon voice recognition software and may include unintentional dictation errors.    Don Perking, [place], MD 04/14/21 [phone]

## 2021-04-14 NOTE — ED Notes (Signed)
Assessment:  Patient fell with injury to the mid back.

## 2021-04-14 NOTE — Evaluation (Signed)
Physical Therapy Evaluation Patient Details Name: Ian King MRN: [id number] DOB: 03-06-1941 Today's Date: 04/14/2021   History of Present Illness  Pt is an 80 y.o. male presenting to hospital 7/12 s/p mechanical fall (pt got up without assistance); c/o R flank pain.  Pt admitted with multiple R sided nondisplaced rib fx's; also displaced fx posterolateral aspect R 7th rib (no pneumothorax noted).  Imaging showing concern for L atrial appendage thrombus (cardiology consulted).  PMH includes dementia, a-fib on Eliquis, DM, htn, CVS, CAD, back trauma s/p spinal surgery, stroke, Raynaud phenomenon, ankylosing spondylitis, seizure disorder, arm sx, hip replacement, knee replacement.  Clinical Impression  Prior to hospital admission, pt was ambulatory with RW (always has assist with functional mobility for safety); lives with his wife; has sitter during day and wife assists pt at night; 6 STE with L railing.  Currently pt is mod assist semi-supine to sitting edge of bed; pt requiring intermittent assist/cueing with upright sitting balance (pt leaning towards L side d/t R rib pain); CGA with transfers (using RW); and CGA to ambulate 75 feet with RW.  Pt appearing most uncomfortable with bed mobility and sitting edge of bed (d/t R sided rib pain) but no pain-like behavior noted with transfers or ambulation during session.  Pt would benefit from skilled PT to address noted impairments and functional limitations (see below for any additional details).  Upon hospital discharge, pt would benefit from HHPT and 24/7 assist with functional mobility for safety (pt's daughter reports pt has 24/7 assist already in place and is in agreement with discharge home with HHPT).    Follow Up Recommendations Home health PT;Supervision/Assistance - 24 hour    Equipment Recommendations  Rolling walker with 5" wheels;3in1 (PT)    Recommendations for Other Services OT consult     Precautions / Restrictions  Precautions Precautions: Fall Precaution Comments: Seizure precautions Restrictions Weight Bearing Restrictions: No      Mobility  Bed Mobility Overal bed mobility: Needs Assistance Bed Mobility: Supine to Sit     Supine to sit: Mod assist;HOB elevated     General bed mobility comments: assist for trunk; cueing for technique    Transfers Overall transfer level: Needs assistance Equipment used: Rolling walker (2 wheeled) Transfers: Sit to/from Stand Sit to Stand: Min guard         General transfer comment: fairly strong stand up to RW (requires vc's and tactile cues for UE/LE placement for transfers)  Ambulation/Gait Ambulation/Gait assistance: Min guard Gait Distance (Feet): 75 Feet Assistive device: Rolling walker (2 wheeled)   Gait velocity: decreased   General Gait Details: decreased B LE step length; forward flexed posture; CGA and cueing for safety  Stairs            Wheelchair Mobility    Modified Rankin (Stroke Patients Only)       Balance Overall balance assessment: Needs assistance Sitting-balance support: Feet supported;Bilateral upper extremity supported Sitting balance-Leahy Scale: Poor Sitting balance - Comments: pt tending to lean towards L side in sitting d/t R rib pain requiring vc's and assist for upright posture (pt able to briefly maintain sitting balance on own before leaning back towards L side Postural control: Left lateral lean Standing balance support: Bilateral upper extremity supported Standing balance-Leahy Scale: Fair Standing balance comment: steady static standing with B UE support                             Pertinent  Vitals/Pain Pain Assessment: Faces Faces Pain Scale: Hurts a little bit Pain Location: R sided ribs Pain Descriptors / Indicators: Grimacing;Guarding Pain Intervention(s): Limited activity within patient's tolerance;Monitored during session;Premedicated before session;Repositioned Vitals  (HR and O2 on room air) stable and WFL throughout treatment session.    Home Living Family/patient expects to be discharged to:: Private residence Living Arrangements: Spouse/significant other Available Help at Discharge: Family;Personal care attendant;Available 24 hours/day Type of Home: House Home Access: Stairs to enter Entrance Stairs-Rails: Left Entrance Stairs-Number of Steps: 6 Home Layout: One level Home Equipment: Walker - 2 wheels;Grab bars - toilet;Grab bars - tub/shower;Shower seat - built in;Hospital bed      Prior Function Level of Independence: Needs assistance   Gait / Transfers Assistance Needed: Ambulates with RW (has assist for all functional mobility for safety); h/o falls     Comments: Has sitter during the day (pt's wife assists pt at night).  Pt sleeps in hospital bed vs queen sized bed vs recliner depending on the day.     Hand Dominance        Extremity/Trunk Assessment   Upper Extremity Assessment Upper Extremity Assessment: Generalized weakness    Lower Extremity Assessment Lower Extremity Assessment: Generalized weakness    Cervical / Trunk Assessment Cervical / Trunk Assessment: Other exceptions Cervical / Trunk Exceptions: forward head/shoulders  Communication   Communication: HOH  Cognition Arousal/Alertness: Awake/alert Behavior During Therapy: Flat affect Overall Cognitive Status: History of cognitive impairments - at baseline                                 General Comments: Oriented to at least person; pt very HOH      General Comments  Nursing cleared pt for participation in physical therapy.  Pt agreeable to PT session.  Pt's daughter present entire therapy session.    Exercises     Assessment/Plan    PT Assessment Patient needs continued PT services  PT Problem List Decreased strength;Decreased activity tolerance;Decreased balance;Decreased mobility;Decreased knowledge of use of DME;Decreased knowledge of  precautions;Decreased safety awareness;Pain       PT Treatment Interventions DME instruction;Gait training;Stair training;Functional mobility training;Therapeutic activities;Therapeutic exercise;Balance training;Patient/family education    PT Goals (Current goals can be found in the Care Plan section)  Acute Rehab PT Goals Patient Stated Goal: to go home PT Goal Formulation: With patient/family Time For Goal Achievement: 04/28/21 Potential to Achieve Goals: Good    Frequency Min 2X/week   Barriers to discharge        Co-evaluation               AM-PAC PT "6 Clicks" Mobility  Outcome Measure Help needed turning from your back to your side while in a flat bed without using bedrails?: A Little Help needed moving from lying on your back to sitting on the side of a flat bed without using bedrails?: A Lot Help needed moving to and from a bed to a chair (including a wheelchair)?: A Little Help needed standing up from a chair using your arms (e.g., wheelchair or bedside chair)?: A Little Help needed to walk in hospital room?: A Little Help needed climbing 3-5 steps with a railing? : A Little 6 Click Score: 17    End of Session Equipment Utilized During Treatment: Gait belt Activity Tolerance: Patient tolerated treatment well Patient left: in chair;with call bell/phone within reach;with chair alarm set;with family/visitor present Nurse Communication: Mobility status;Precautions PT  Visit Diagnosis: Other abnormalities of gait and mobility (R26.89);Muscle weakness (generalized) (M62.81);History of falling (Z91.81)    Time: 4656-8127 PT Time Calculation (min) (ACUTE ONLY): 48 min   Charges:   PT Evaluation $PT Eval Low Complexity: 1 Low PT Treatments $Gait Training: 8-22 mins $Therapeutic Activity: 8-22 mins       Hendricks Limes, PT 04/14/21, 5:34 PM

## 2021-04-15 DIAGNOSIS — W19XXXA Unspecified fall, initial encounter: Secondary | ICD-10-CM | POA: Diagnosis not present

## 2021-04-15 DIAGNOSIS — Y92009 Unspecified place in unspecified non-institutional (private) residence as the place of occurrence of the external cause: Secondary | ICD-10-CM

## 2021-04-15 DIAGNOSIS — I10 Essential (primary) hypertension: Secondary | ICD-10-CM | POA: Diagnosis not present

## 2021-04-15 LAB — GLUCOSE, CAPILLARY
Glucose-Capillary: 159 mg/dL — ABNORMAL HIGH (ref 70–99)
Glucose-Capillary: 185 mg/dL — ABNORMAL HIGH (ref 70–99)
Glucose-Capillary: 186 mg/dL — ABNORMAL HIGH (ref 70–99)
Glucose-Capillary: 154 mg/dL — ABNORMAL HIGH (ref 70–99)

## 2021-04-15 LAB — TROPONIN I (HIGH SENSITIVITY)
Troponin I (High Sensitivity): 26 ng/L — ABNORMAL HIGH (ref <18)
Troponin I (High Sensitivity): 22 ng/L — ABNORMAL HIGH (ref <18)

## 2021-04-15 LAB — BASIC METABOLIC PANEL
Anion gap: 6 (ref 5–15)
BUN: 11 mg/dL (ref 8–23)
CO2: 27 mmol/L (ref 22–32)
Calcium: 10 mg/dL (ref 8.9–10.3)
Chloride: 108 mmol/L (ref 98–111)
Creatinine, Ser: 0.68 mg/dL (ref 0.61–1.24)
GFR, Estimated: >60 mL/min (ref >60)
Glucose, Bld: 142 mg/dL — ABNORMAL HIGH (ref 70–99)
Potassium: 3.5 mmol/L (ref 3.5–5.1)
Sodium: 141 mmol/L (ref 135–145)

## 2021-04-15 LAB — CBC
HCT: 34 % — ABNORMAL LOW (ref 39.0–52.0)
Hemoglobin: 11.3 g/dL — ABNORMAL LOW (ref 13.0–17.0)
MCH: 31.6 pg (ref 26.0–34.0)
MCHC: 33.2 g/dL (ref 30.0–36.0)
MCV: 95 fL (ref 80.0–100.0)
Platelets: 128 10*3/uL — ABNORMAL LOW (ref 150–400)
RBC: 3.58 MIL/uL — ABNORMAL LOW (ref 4.22–5.81)
RDW: 14.2 % (ref 11.5–15.5)
WBC: 6.4 10*3/uL (ref 4.0–10.5)
nRBC: 0 % (ref 0.0–0.2)

## 2021-04-15 MED ORDER — TRAMADOL HCL 50 MG PO TABS
50.0000 mg | ORAL_TABLET | Freq: Four times a day (QID) | ORAL | Status: DC | PRN
  Administered 2021-04-16: 50 mg via ORAL
  Filled 2021-04-15: qty 1

## 2021-04-15 MED ORDER — KETOROLAC TROMETHAMINE 30 MG/ML IJ SOLN
30.0000 mg | Freq: Four times a day (QID) | INTRAMUSCULAR | Status: DC
  Administered 2021-04-15 – 2021-04-16 (×4): 30 mg via INTRAVENOUS
  Filled 2021-04-15 (×4): qty 1

## 2021-04-15 MED ORDER — ATROPINE SULFATE 0.4 MG/ML IJ SOLN
0.4000 mg | Freq: Once | INTRAMUSCULAR | Status: DC | PRN
  Filled 2021-04-15: qty 1

## 2021-04-15 NOTE — TOC Progression Note (Signed)
Transition of Care [facility]) - Progression Note    Patient Details  Name: Ian King MRN: [id number] Date of Birth: 06/20/1941  Transition of Care [facility]) CM/SW Contact  Barrie Dunker, RN Phone Number: 04/15/2021, 3:11 PM  Clinical Narrative:   Spoke with the patient and the family in the room concerning the decline in the patient's mobility progress.  They are agreeable to get a bed search for STR SNF to be done in case the patient is unable to go home with HH PASSR obtained, FL2 completed, Bedsearch sent out    Expected Discharge Plan: Home w Home Health Services Barriers to Discharge: Barriers Resolved  Expected Discharge Plan and Services Expected Discharge Plan: Home w Home Health Services   Discharge Planning Services: CM Consult   Living arrangements for the past 2 months: Single Family Home                 DME Arranged: N/A         HH Arranged: PT, OT HH Agency: [facility] (now [facility]) Date HH Agency Contacted: 04/15/21 Time HH Agency Contacted: [phone] Representative spoke with at [facility] Agency: STacie   Social Determinants of Health (SDOH) Interventions    Readmission Risk Interventions No flowsheet data found.

## 2021-04-15 NOTE — NC FL2 (Signed)
[place] MEDICAID FL2 LEVEL OF CARE SCREENING TOOL     IDENTIFICATION  Patient Name: Ian King Birthdate: [birth year] Sex: male Admission Date (Current Location): 04/14/2021  [facility] and [place] Number:  Chiropodist and Address:  [facility], [address] [place], [place] [address]      Provider Number: [id number]  Attending Physician Name and Address:  Rolly Salter, MD  Relative Name and Phone Number:  Samara Deist [phone]    Current Level of Care: Hospital Recommended Level of Care: Skilled Nursing Facility Prior Approval Number:    Date Approved/Denied:   PASRR Number: [id number]  Discharge Plan: SNF    Current Diagnoses: Patient Active Problem List   Diagnosis Date Noted   Fall at home, initial encounter 04/14/2021   Stroke (HCC) 04/14/2021   Dementia (HCC) 04/14/2021   Right rib fracture 04/14/2021   Renal lesion 04/14/2021   Gout flare 01/21/2021   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/17/2021   AMS (altered mental status) 01/16/2021   Raynaud phenomenon 03/22/2020   Chronic venous insufficiency 03/22/2020   B12 deficiency 03/10/2020   Acute ischemic multifocal anterior circulation stroke (HCC) 03/09/2020   Pseudophakia of left eye 07/02/2018   Obesity, Class I, BMI 30-34.9 02/15/2018   Closed displaced fracture of distal phalanx of left middle finger 03/08/2017   Closed fracture of distal end of left radius with routine healing 01/16/2017   Closed fracture of nasal bone with routine healing 01/12/2017   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   Closed fracture of dorsal (thoracic) vertebra (HCC) 12/19/2016   Fall 12/19/2016   Fracture of left radius 12/19/2016   Primary osteoarthritis of right knee 10/14/2015   Acute pain of right knee 09/09/2015   Aftercare following joint replacement surgery 07/31/2015   Closed nondisplaced fracture of lateral condyle of femur (HCC) 07/31/2015   Bilateral edema of lower extremity  06/26/2014   Chronic atrial fibrillation (HCC) 07/13/2013   Wound disruption, post-op, skin 10/11/2012   Cellulitis and abscess 10/05/2012   Hyperlipidemia 08/17/2012   Osteoarthritis 08/17/2012   Seizure disorder (HCC) 08/16/2012   Hip pain 12/12/2011   Osteoarthritis of hip 12/08/2011   CAD (coronary artery disease) 07/07/2011   Diabetes mellitus without complication (HCC) 07/07/2011   HTN (hypertension) 07/07/2011    Orientation RESPIRATION BLADDER Height & Weight     Self, Place  Normal External catheter Weight:   Height:     BEHAVIORAL SYMPTOMS/MOOD NEUROLOGICAL BOWEL NUTRITION STATUS      Continent Diet (regular)  AMBULATORY STATUS COMMUNICATION OF NEEDS Skin   Extensive Assist Verbally Normal                       Personal Care Assistance Level of Assistance  Bathing, Feeding, Dressing Bathing Assistance: Limited assistance Feeding assistance: Limited assistance Dressing Assistance: Limited assistance     Functional Limitations Info             SPECIAL CARE FACTORS FREQUENCY  PT (By licensed PT), OT (By licensed OT)     PT Frequency: 5 times per week OT Frequency: 3 times per week            Contractures Contractures Info: Not present    Additional Factors Info  Code Status, Allergies Code Status Info: DNR Allergies Info: Doxycycline, Haloperidol, Lovastatin           Current Medications (04/15/2021):  This is the current hospital active medication list Current Facility-Administered Medications  Medication Dose Route  Frequency Provider Last Rate Last Admin   acetaminophen (TYLENOL) tablet 650 mg  650 mg Oral Q6H Lorretta Harp, MD   650 mg at 04/15/21 0837   apixaban (ELIQUIS) tablet 5 mg  5 mg Oral BID Lorretta Harp, MD   5 mg at 04/15/21 0837   atorvastatin (LIPITOR) tablet 80 mg  80 mg Oral Q24H Lorretta Harp, MD   80 mg at 04/14/21 2104   bisacodyl (DULCOLAX) suppository 10 mg  10 mg Rectal Daily PRN Lorretta Harp, MD       cholecalciferol (VITAMIN  D3) tablet 1,000 Units  1,000 Units Oral Daily Lorretta Harp, MD   1,000 Units at 04/15/21 0837   feeding supplement (ENSURE ENLIVE / ENSURE PLUS) liquid 237 mL  237 mL Oral BID BM Lorretta Harp, MD   237 mL at 04/15/21 1000   furosemide (LASIX) tablet 20 mg  20 mg Oral Once per day on Sun Tue Thu Sat Lorretta Harp, MD   20 mg at 04/15/21 7353   hydrocerin (EUCERIN) cream 1 application  1 application Topical BID Lorretta Harp, MD   1 application at 04/15/21 1000   insulin aspart (novoLOG) injection 0-5 Units  0-5 Units Subcutaneous QHS Lorretta Harp, MD   2 Units at 04/14/21 2119   insulin aspart (novoLOG) injection 0-6 Units  0-6 Units Subcutaneous TID WC Lorretta Harp, MD   1 Units at 04/15/21 1221   ketorolac (TORADOL) 30 MG/ML injection 30 mg  30 mg Intravenous Q6H Rolly Salter, MD       levETIRAcetam (KEPPRA) tablet 1,000 mg  1,000 mg Oral BID Lorretta Harp, MD   1,000 mg at 04/15/21 0836   lidocaine (LIDODERM) 5 % 1 patch  1 patch Transdermal Q24H Lorretta Harp, MD   1 patch at 04/15/21 1100   multivitamin with minerals tablet 1 tablet  1 tablet Oral Daily Lorretta Harp, MD   1 tablet at 04/15/21 2992   omega-3 acid ethyl esters (LOVAZA) capsule 1 g  1 g Oral Daily Lorretta Harp, MD   1 g at 04/14/21 1834   ondansetron (ZOFRAN) injection 4 mg  4 mg Intravenous Q8H PRN Lorretta Harp, MD       QUEtiapine (SEROQUEL) tablet 25 mg  25 mg Oral BID PRN Lorretta Harp, MD   25 mg at 04/15/21 1133   QUEtiapine (SEROQUEL) tablet 50 mg  50 mg Oral QHS Lorretta Harp, MD   50 mg at 04/14/21 1348   ramipril (ALTACE) capsule 10 mg  10 mg Oral BID Lorretta Harp, MD   10 mg at 04/15/21 1000   rivastigmine (EXELON) capsule 1.5 mg  1.5 mg Oral BID Lorretta Harp, MD   1.5 mg at 04/15/21 1000   senna-docusate (Senokot-S) tablet 1 tablet  1 tablet Oral QHS PRN Lorretta Harp, MD       traMADol Janean Sark) tablet 50 mg  50 mg Oral Q6H PRN Rolly Salter, MD       triamcinolone cream (KENALOG) 0.1 % cream 1 application  1 application Topical BID Lorretta Harp, MD   1  application at 04/15/21 1000     Discharge Medications: Please see discharge summary for a list of discharge medications.  Relevant Imaging Results:  Relevant Lab Results:   Additional Information SS #: [id number]  Byrdie Miyazaki Clementeen Hoof, RN

## 2021-04-15 NOTE — Progress Notes (Signed)
Met witjh the patient and his caregiver in the room to discuss DC Pplan and needs He lives at home with his wife and has a pad caregiver He transports to the Doctor with the wife and caregiver, he can afford his medications, He has DME at home and does not need additional, He would benefit from PT and OT [facility] services and [facility] has accepted the patient. No additional needs

## 2021-04-15 NOTE — Progress Notes (Signed)
[facility] Progress Note  Patient: Ian King    LMB:[id number]  DOA: 04/14/2021     Date of Service: the patient was seen and examined on 04/15/2021  Brief hospital course: Past medical history of HTN, HLD, DM, CVA, gout, depression, chronic A. fib, seizure, dementia, COVID-19 May 22.  Presents with complaints of a mechanical fall.  Found to have right-sided rib fracture. Cardiology consulted for incidental atrial thrombus seen on the CT scan. Currently plan is pain control and monitor for improvement in mentation as well as ambulation.  Subjective: Reports pain in the back as well as right rib area.  No nausea no vomiting.  Some shortness of breath.  No fever no chills.  Overnight had bradycardic events.  Currently has ongoing PVCs.  No dizziness no lightheadedness.  Assessment and Plan: 1.  Mechanical fall. Acute right rib fracture CT head as well as C-spine negative for any acute injury. CT chest shows acute right-sided rib fractures. Continue incentive spirometry. Pain control has been an issue as the patient is not able to tolerate narcotic pain medications. We will provide scheduled Toradol along with already ordered scheduled Tylenol. Recommended family to consider low-dose tramadol for pain control as well. Monitor response. PT progressive significantly limited secondary to pain. Now requiring SNF.  Monitor.  2.  Left atrial thrombus History of chronic A. fib On chronic anticoagulation Bradycardic events on telemetry CT incidentally shows left atrial appendage thrombus. Cardiology consulted. Patient currently on Eliquis. Further work-up will not change management and I agree with that and therefore continue with Eliquis. Also patient was on Lopressor but overnight had bradycardic events while sleeping on telemetry. Lopressor is currently on hold. Awaiting further recommendation from cardiology. Patient may benefit from outpatient ZIO monitor.  3.  CAD, HLD,  HTN Not on any antiplatelet medication.  Monitor.  Currently Lopressor on hold.  Blood pressure stable. Lipitor continue.  4.  Type 2 diabetes mellitus, well controlled.  With HLD.  No long-term insulin use. Currently on sliding scale insulin.  5.  History of seizure disorder On Keppra.  Continue.  6.  Dementia On Exelon patch as well as Seroquel.  Exelon patch can likely cause bradycardic events. For now we will continue.  7.  Renal lesion Indeterminant lesion on left kidney lower pole. Nonemergent MRI with and without contrast is recommended to exclude neoplasm.  8.  Pulmonary nodule. Noncontrast CT chest can be considered in 12 months.  Scheduled Meds:  acetaminophen  650 mg Oral Q6H   apixaban  5 mg Oral BID   atorvastatin  80 mg Oral Q24H   cholecalciferol  1,000 Units Oral Daily   feeding supplement  237 mL Oral BID BM   furosemide  20 mg Oral Once per day on Sun Tue Thu Sat   hydrocerin  1 application Topical BID   insulin aspart  0-5 Units Subcutaneous QHS   insulin aspart  0-6 Units Subcutaneous TID WC   ketorolac  30 mg Intravenous Q6H   levETIRAcetam  1,000 mg Oral BID   lidocaine  1 patch Transdermal Q24H   multivitamin with minerals  1 tablet Oral Daily   omega-3 acid ethyl esters  1 g Oral Daily   QUEtiapine  50 mg Oral QHS   ramipril  10 mg Oral BID   rivastigmine  1.5 mg Oral BID   triamcinolone cream  1 application Topical BID   Continuous Infusions: PRN Meds: bisacodyl, ondansetron (ZOFRAN) IV, QUEtiapine, senna-docusate, traMADol  There is  no height or weight on file to calculate BMI.        DVT Prophylaxis:    apixaban (ELIQUIS) tablet 5 mg    Advance goals of care discussion: Pt is DNR.  Family Communication: family was present at bedside, at the time of interview.  The pt provided permission to discuss medical plan with the family. Opportunity was given to ask question and all questions were answered satisfactorily.   Data Reviewed: I  have personally reviewed and interpreted daily labs, tele strips, imaging. Sodium potassium stable.  Serum creatinine stable normal.  Hemoglobin stable.  Platelets 128.  Physical Exam:  General: Appear in mild distress, no Rash; Oral Mucosa Clear, moist. no Abnormal Neck Mass Or lumps, Conjunctiva normal  Cardiovascular: S1 and S2 Present, aortic systolic  Murmur, Respiratory: good respiratory effort, Bilateral Air entry present and CTA, no Crackles, no wheezes Abdomen: Bowel Sound present, Soft and no tenderness Extremities: trace Pedal edema Neurology: alert and oriented to time, place, and person affect appropriate. no new focal deficit Gait not checked due to patient safety concerns  Vitals:   04/15/21 0524 04/15/21 0826 04/15/21 1203 04/15/21 1607  BP: (!) 151/63 (!) 157/104 (!) 186/99 (!) 152/79  Pulse: 68 67 (!) 59 62  Resp: 19 16 16 16   Temp: (!) 97.5 F (36.4 C) 98.3 F (36.8 C) 97.6 F (36.4 C) 98.2 F (36.8 C)  TempSrc:      SpO2: 95% 96% 98% 97%    Disposition:  Status is: Observation.  Patient requires observation because of fatigue and need for pain control as well as history of known intolerance for narcotics.  ispo: The patient is from: Home              Anticipated d/c is to: SNF              Patient currently is not medically stable to d/c.   Difficult to place patient No  Time spent: 35 minutes. I reviewed all nursing notes, pharmacy notes, vitals, pertinent old records. I have discussed plan of care as described above with RN.  Author: , MD [facility] 04/15/2021 6:42 PM  To reach On-call, see care teams to locate the attending and reach out via [email or web address].04/17/2021. Between 7PM-7AM, please contact night-coverage If you still have difficulty reaching the attending provider, please page the [facility] (Director on Call) for [facility] on amion for assistance.

## 2021-04-15 NOTE — Progress Notes (Signed)
SLP Cancellation Note  Patient Details Name: Ian King MRN: [id number] DOB: 01-03-1941   Cancelled treatment:       Reason Eval/Treat Not Completed: SLP screened, no needs identified, will sign off  Ramon Zanders B. Dreama Saa M.S., CCC-SLP, [facility] Speech-Language Pathologist Rehabilitation Services Office [phone]   Reuel Derby 04/15/2021, 5:03 PM

## 2021-04-15 NOTE — Progress Notes (Signed)
Occupational Therapy Treatment Patient Details Name: Ian King MRN: [id number] DOB: 1941-07-22 Today's Date: 04/15/2021    History of present illness Pt is an 80 y.o. male presenting to hospital 7/12 s/p mechanical fall (pt got up without assistance); c/o R flank pain.  Pt admitted with multiple R sided nondisplaced rib fx's; also displaced fx posterolateral aspect R 7th rib (no pneumothorax noted).  Imaging showing concern for L atrial appendage thrombus (cardiology consulted).  PMH includes dementia, a-fib on Eliquis, DM, htn, CVS, CAD, back trauma s/p spinal surgery, stroke, Raynaud phenomenon, ankylosing spondylitis, seizure disorder, arm sx, hip replacement, knee replacement.   OT comments  Pt seen for OT f/u tx this date to address safety with ADLs. Pt with some increased c/o pain this date with mobilizing and appears somewhat more perseverative on pain, and therefore demonstrating decreased sequencing skills for basic tasks including fxl mobility, throughout session. Despite multimodal cueing, simplifying steps of tasks, and modifying room to ensure safe pathways and reduce problem solving. OT enmgages pt in sup to sit with MOD A. Pt with G static sitting balance, but is noted to be L leaning to reduce pain on L side. Pt requires MIN A to perform STS and tolerates 3 trials while ADL tasks are being completed including LB bathing in standing with MOD/MAX A. Pt requires MIN/MOD A and cues to initiate/sequence to SPS to recliner with chair alarm. Pt left with all needs set and in reach. Pt's spouse present throughout session. Will continue to follow.    Follow Up Recommendations  Home health OT;Supervision/Assistance - 24 hour    Equipment Recommendations  3 in 1 bedside commode;Tub/shower seat    Recommendations for Other Services      Precautions / Restrictions Precautions Precautions: Fall Precaution Comments: Seizure precautions Restrictions Weight Bearing Restrictions: No        Mobility Bed Mobility Overal bed mobility: Needs Assistance Bed Mobility: Supine to Sit     Supine to sit: Mod assist;HOB elevated     General bed mobility comments: increased time, use of rails    Transfers Overall transfer level: Needs assistance Equipment used: Rolling walker (2 wheeled) Transfers: Sit to/from [employer] Sit to Stand: Min assist Stand pivot transfers: Min assist;Mod assist       General transfer comment: increased assistance this date, appears to be more pain limited    Balance Overall balance assessment: Needs assistance Sitting-balance support: Feet supported;Bilateral upper extremity supported Sitting balance-Leahy Scale: Fair Sitting balance - Comments: F static sitting EOB, but is noted to lean to his L side to offset weight to his injured R side Postural control: Left lateral lean Standing balance support: Bilateral upper extremity supported Standing balance-Leahy Scale: Poor Standing balance comment: bracing LEs on back of chair.                           ADL either performed or assessed with clinical judgement   ADL Overall ADL's : Needs assistance/impaired     Grooming: Wash/dry face;Set up;Cueing for sequencing;Sitting Grooming Details (indicate cue type and reason): increased time Upper Body Bathing: Minimal assistance;Sitting;Cueing for sequencing Upper Body Bathing Details (indicate cue type and reason): increased time Lower Body Bathing: Moderate assistance;Maximal assistance;Sit to/from stand Lower Body Bathing Details (indicate cue type and reason): MIN A To CTS, use of RW for balance, MOD/MAX A for posterior standing LB bathing, pt with h/o limited ROM in trunk d/t back sx Upper Body  Dressing : Minimal assistance;Sitting   Lower Body Dressing: Moderate assistance;Maximal assistance;Sit to/from stand Lower Body Dressing Details (indicate cue type and reason): to doff soiled brief and don clean one              Functional mobility during ADLs: Maximal assistance;Rolling walker (while pt does not need MAX A to sustain static standing, he requires MAX A to take steps as OT has to manually advance feet as pt is perseverative on pain and information written on white board. He has decreased sequencing of steps with RW despite cues) General ADL Comments: generally requiring more cues today. Requires initiation of just about all ADL tasks and then he is able to backward chain task     Vision Patient Visual Report: No change from baseline     Perception     Praxis      Cognition Arousal/Alertness: Awake/alert Behavior During Therapy: Flat affect Overall Cognitive Status: History of cognitive impairments - at baseline                                 General Comments: Oriented to at least person; pt very HOH. Follows ~50% simple one step commands with increased processing time. Dtr reports that he reads lips. Does demo some increased perseveration on pain this date, and as a result, decreased sequening of ADL tasks performed.        Exercises Other Exercises Other Exercises: OT engages pt in bathing/dressing tasks as well as STS x3 for ADL tasks and SPS x1 to recliner as pt with diffiuclty sequencing steps.   Shoulder Instructions       General Comments      Pertinent Vitals/ Pain       Pain Assessment: Faces Faces Pain Scale: Hurts even more Pain Location: R sided ribs Pain Descriptors / Indicators: Grimacing;Guarding Pain Intervention(s): Limited activity within patient's tolerance;Monitored during session  Home Living                                          Prior Functioning/Environment              Frequency  Min 1X/week        Progress Toward Goals  OT Goals(current goals can now be found in the care plan section)  Progress towards OT goals: Progressing toward goals  Acute Rehab OT Goals Patient Stated Goal: to go home OT  Goal Formulation: With patient/family Time For Goal Achievement: 04/28/21 Potential to Achieve Goals: Fair  Plan Discharge plan remains appropriate    Co-evaluation                 AM-PAC OT "6 Clicks" Daily Activity     Outcome Measure   Help from another person eating meals?: A Little Help from another person taking care of personal grooming?: A Little Help from another person toileting, which includes using toliet, bedpan, or urinal?: A Lot Help from another person bathing (including washing, rinsing, drying)?: A Lot Help from another person to put on and taking off regular upper body clothing?: A Little Help from another person to put on and taking off regular lower body clothing?: A Lot 6 Click Score: 15    End of Session Equipment Utilized During Treatment: Gait belt;Rolling walker  OT Visit Diagnosis: Unsteadiness on feet (R26.81);Muscle weakness (generalized) (  M62.81)   Activity Tolerance Patient tolerated treatment well   Patient Left in chair;with call bell/phone within reach;with chair alarm set;with family/visitor present   Nurse Communication Mobility status        Time: 4825-0037 OT Time Calculation (min): 63 min  Charges: OT General Charges $OT Visit: 1 Visit OT Treatments $Self Care/Home Management : 23-37 mins $Therapeutic Activity: 23-37 mins  Rejeana Brock, MS, OTR/L ascom [phone] 04/15/21, 11:02 AM

## 2021-04-15 NOTE — Progress Notes (Signed)
Physical Therapy Treatment Patient Details Name: Ian King MRN: [id number] DOB: Oct 09, 1940 Today's Date: 04/15/2021    History of Present Illness Pt is an 80 y.o. male presenting to hospital 7/12 s/p mechanical fall (pt got up without assistance); c/o R flank pain.  Pt admitted with multiple R sided nondisplaced rib fx's; also displaced fx posterolateral aspect R 7th rib (no pneumothorax noted).  Imaging showing concern for L atrial appendage thrombus (cardiology consulted).  PMH includes dementia, a-fib on Eliquis, DM, htn, CVS, CAD, back trauma s/p spinal surgery, stroke, Raynaud phenomenon, ankylosing spondylitis, seizure disorder, arm sx, hip replacement, knee replacement.    PT Comments    Pt received seated in recliner upon arrival to room.  Pt agreeable to therapy.  Pt attempted to stand with multiple attempts throughout therapy session, needing +2 assistance at the stairs to come upright and for stair training.  Pt able to place foot on first step, but unable to lift rest of body with +2 assistance onto the step.  Pt notes pain and fatigue being factors.  Pt did perform STS multiple times and remainder of time spent with family educating on safety and transfers at home.  Case manager and PT spoke with pt and wife and due to lack of progress and regression during today's treatment, SNF would be the ongoing recommendation.  Current discharge plans changed to SNF is the most appropriate option at this time.  Pt will continue to benefit from skilled therapy in order to address deficits listed below.     Follow Up Recommendations  Supervision/Assistance - 24 hour;SNF     Equipment Recommendations  Rolling walker with 5" wheels;3in1 (PT)    Recommendations for Other Services OT consult     Precautions / Restrictions Precautions Precautions: Fall Precaution Comments: Seizure precautions Restrictions Weight Bearing Restrictions: No    Mobility  Bed Mobility                General bed mobility comments: up to chair pre/post    Transfers Overall transfer level: Needs assistance Equipment used: Rolling walker (2 wheeled) Transfers: Sit to/from Stand Sit to Stand: Mod assist;+2 physical assistance;+2 safety/equipment            Ambulation/Gait                 Stairs             Wheelchair Mobility    Modified Rankin (Stroke Patients Only)       Balance Overall balance assessment: Needs assistance Sitting-balance support: Feet supported;Bilateral upper extremity supported Sitting balance-Leahy Scale: Fair Sitting balance - Comments: F static sitting EOB, but is noted to lean to his L side to offset weight to his injured R side Postural control: Left lateral lean Standing balance support: Bilateral upper extremity supported Standing balance-Leahy Scale: Poor Standing balance comment: bracing LEs on back of chair.                            Cognition Arousal/Alertness: Awake/alert Behavior During Therapy: Flat affect Overall Cognitive Status: History of cognitive impairments - at baseline                                 General Comments: Oriented to at least person; pt very HOH. Follows ~50% simple one step commands with increased processing time. Dtr reports that he reads lips. Does demo  some increased perseveration on pain this date, and as a result, decreased sequening of ADL tasks performed.      Exercises      General Comments        Pertinent Vitals/Pain Pain Assessment: Faces Faces Pain Scale: Hurts even more Pain Location: R sided ribs (posterior) Pain Descriptors / Indicators: Grimacing;Guarding Pain Intervention(s): Limited activity within patient's tolerance;Monitored during session;Repositioned    Home Living                      Prior Function            PT Goals (current goals can now be found in the care plan section) Acute Rehab PT Goals Patient Stated Goal:  to go home PT Goal Formulation: With patient/family Time For Goal Achievement: 04/28/21 Potential to Achieve Goals: Good Progress towards PT goals: Not progressing toward goals - comment (Pt unable to ambulate today or stand without +2 mod/max A.)    Frequency    Min 2X/week      PT Plan Discharge plan needs to be updated    Co-evaluation              AM-PAC PT "6 Clicks" Mobility   Outcome Measure  Help needed turning from your back to your side while in a flat bed without using bedrails?: A Lot Help needed moving from lying on your back to sitting on the side of a flat bed without using bedrails?: A Lot Help needed moving to and from a bed to a chair (including a wheelchair)?: A Lot Help needed standing up from a chair using your arms (e.g., wheelchair or bedside chair)?: A Lot Help needed to walk in hospital room?: A Lot Help needed climbing 3-5 steps with a railing? : A Lot 6 Click Score: 12    End of Session Equipment Utilized During Treatment: Gait belt Activity Tolerance: Patient tolerated treatment well Patient left: in chair;with call bell/phone within reach;with chair alarm set;with family/visitor present Nurse Communication: Mobility status;Precautions PT Visit Diagnosis: Other abnormalities of gait and mobility (R26.89);Muscle weakness (generalized) (M62.81);History of falling (Z91.81)     Time: 1400-1446 PT Time Calculation (min) (ACUTE ONLY): 46 min  Charges:  $Gait Training: 38-52 mins                     Nolon Bussing, PT, DPT 04/15/21, 3:30 PM

## 2021-04-15 NOTE — Progress Notes (Addendum)
CCMD called. Pt having multiple episodes of brady, HR dropping to mid 30's but not sustaining. DR. Joylene Igo notified. New orders received. Will cont to monitor the pt.

## 2021-04-16 ENCOUNTER — Observation Stay: Payer: Medicare PPO

## 2021-04-16 DIAGNOSIS — K5900 Constipation, unspecified: Secondary | ICD-10-CM | POA: Diagnosis present

## 2021-04-16 DIAGNOSIS — I513 Intracardiac thrombosis, not elsewhere classified: Secondary | ICD-10-CM | POA: Diagnosis present

## 2021-04-16 DIAGNOSIS — I5032 Chronic diastolic (congestive) heart failure: Secondary | ICD-10-CM | POA: Diagnosis present

## 2021-04-16 DIAGNOSIS — F05 Delirium due to known physiological condition: Secondary | ICD-10-CM | POA: Diagnosis not present

## 2021-04-16 DIAGNOSIS — S2241XA Multiple fractures of ribs, right side, initial encounter for closed fracture: Secondary | ICD-10-CM | POA: Diagnosis present

## 2021-04-16 DIAGNOSIS — F32A Depression, unspecified: Secondary | ICD-10-CM | POA: Diagnosis present

## 2021-04-16 DIAGNOSIS — T1490XA Injury, unspecified, initial encounter: Secondary | ICD-10-CM | POA: Diagnosis present

## 2021-04-16 DIAGNOSIS — W1830XA Fall on same level, unspecified, initial encounter: Secondary | ICD-10-CM | POA: Diagnosis present

## 2021-04-16 DIAGNOSIS — I10 Essential (primary) hypertension: Secondary | ICD-10-CM | POA: Diagnosis not present

## 2021-04-16 DIAGNOSIS — I482 Chronic atrial fibrillation, unspecified: Secondary | ICD-10-CM | POA: Diagnosis present

## 2021-04-16 DIAGNOSIS — Z66 Do not resuscitate: Secondary | ICD-10-CM | POA: Diagnosis present

## 2021-04-16 DIAGNOSIS — Z951 Presence of aortocoronary bypass graft: Secondary | ICD-10-CM | POA: Diagnosis not present

## 2021-04-16 DIAGNOSIS — Z7901 Long term (current) use of anticoagulants: Secondary | ICD-10-CM | POA: Diagnosis not present

## 2021-04-16 DIAGNOSIS — R443 Hallucinations, unspecified: Secondary | ICD-10-CM | POA: Diagnosis present

## 2021-04-16 DIAGNOSIS — E119 Type 2 diabetes mellitus without complications: Secondary | ICD-10-CM | POA: Diagnosis present

## 2021-04-16 DIAGNOSIS — Z8673 Personal history of transient ischemic attack (TIA), and cerebral infarction without residual deficits: Secondary | ICD-10-CM | POA: Diagnosis not present

## 2021-04-16 DIAGNOSIS — I110 Hypertensive heart disease with heart failure: Secondary | ICD-10-CM | POA: Diagnosis present

## 2021-04-16 DIAGNOSIS — Y92009 Unspecified place in unspecified non-institutional (private) residence as the place of occurrence of the external cause: Secondary | ICD-10-CM | POA: Diagnosis not present

## 2021-04-16 DIAGNOSIS — Z79899 Other long term (current) drug therapy: Secondary | ICD-10-CM | POA: Diagnosis not present

## 2021-04-16 DIAGNOSIS — G40909 Epilepsy, unspecified, not intractable, without status epilepticus: Secondary | ICD-10-CM | POA: Diagnosis present

## 2021-04-16 DIAGNOSIS — Z8616 Personal history of COVID-19: Secondary | ICD-10-CM | POA: Diagnosis not present

## 2021-04-16 DIAGNOSIS — E785 Hyperlipidemia, unspecified: Secondary | ICD-10-CM | POA: Diagnosis present

## 2021-04-16 DIAGNOSIS — I493 Ventricular premature depolarization: Secondary | ICD-10-CM | POA: Diagnosis present

## 2021-04-16 DIAGNOSIS — I2510 Atherosclerotic heart disease of native coronary artery without angina pectoris: Secondary | ICD-10-CM | POA: Diagnosis present

## 2021-04-16 DIAGNOSIS — R911 Solitary pulmonary nodule: Secondary | ICD-10-CM | POA: Diagnosis present

## 2021-04-16 DIAGNOSIS — I700 Atherosclerosis of aorta: Secondary | ICD-10-CM | POA: Diagnosis present

## 2021-04-16 DIAGNOSIS — N289 Disorder of kidney and ureter, unspecified: Secondary | ICD-10-CM | POA: Diagnosis present

## 2021-04-16 DIAGNOSIS — F0390 Unspecified dementia without behavioral disturbance: Secondary | ICD-10-CM | POA: Diagnosis present

## 2021-04-16 DIAGNOSIS — W19XXXA Unspecified fall, initial encounter: Secondary | ICD-10-CM | POA: Diagnosis not present

## 2021-04-16 LAB — CBC
HCT: 35.9 % — ABNORMAL LOW (ref 39.0–52.0)
Hemoglobin: 11.7 g/dL — ABNORMAL LOW (ref 13.0–17.0)
MCH: 31 pg (ref 26.0–34.0)
MCHC: 32.6 g/dL (ref 30.0–36.0)
MCV: 95.2 fL (ref 80.0–100.0)
Platelets: 132 10*3/uL — ABNORMAL LOW (ref 150–400)
RBC: 3.77 MIL/uL — ABNORMAL LOW (ref 4.22–5.81)
RDW: 14.4 % (ref 11.5–15.5)
WBC: 6.1 10*3/uL (ref 4.0–10.5)
nRBC: 0 % (ref 0.0–0.2)

## 2021-04-16 LAB — BASIC METABOLIC PANEL
Anion gap: 5 (ref 5–15)
BUN: 14 mg/dL (ref 8–23)
CO2: 29 mmol/L (ref 22–32)
Calcium: 10 mg/dL (ref 8.9–10.3)
Chloride: 105 mmol/L (ref 98–111)
Creatinine, Ser: 0.79 mg/dL (ref 0.61–1.24)
GFR, Estimated: >60 mL/min (ref >60)
Glucose, Bld: 135 mg/dL — ABNORMAL HIGH (ref 70–99)
Potassium: 3.8 mmol/L (ref 3.5–5.1)
Sodium: 139 mmol/L (ref 135–145)

## 2021-04-16 LAB — GLUCOSE, CAPILLARY
Glucose-Capillary: 127 mg/dL — ABNORMAL HIGH (ref 70–99)
Glucose-Capillary: 171 mg/dL — ABNORMAL HIGH (ref 70–99)
Glucose-Capillary: 135 mg/dL — ABNORMAL HIGH (ref 70–99)
Glucose-Capillary: 156 mg/dL — ABNORMAL HIGH (ref 70–99)

## 2021-04-16 LAB — MAGNESIUM: Magnesium: 1.5 mg/dL — ABNORMAL LOW (ref 1.7–2.4)

## 2021-04-16 MED ORDER — MAGNESIUM SULFATE 2 GM/50ML IV SOLN
2.0000 g | Freq: Once | INTRAVENOUS | Status: AC
  Administered 2021-04-16: 2 g via INTRAVENOUS
  Filled 2021-04-16: qty 50

## 2021-04-16 MED ORDER — HYDROCODONE-ACETAMINOPHEN 5-325 MG PO TABS
1.0000 | ORAL_TABLET | Freq: Four times a day (QID) | ORAL | Status: DC | PRN
  Administered 2021-04-16 – 2021-04-17 (×2): 1 via ORAL
  Filled 2021-04-16 (×2): qty 1

## 2021-04-16 MED ORDER — KETOROLAC TROMETHAMINE 15 MG/ML IJ SOLN
15.0000 mg | Freq: Four times a day (QID) | INTRAMUSCULAR | Status: DC
  Administered 2021-04-16 – 2021-04-18 (×7): 15 mg via INTRAVENOUS
  Filled 2021-04-16 (×7): qty 1

## 2021-04-16 MED ORDER — FUROSEMIDE 10 MG/ML IJ SOLN
20.0000 mg | Freq: Once | INTRAMUSCULAR | Status: AC
  Administered 2021-04-16: 20 mg via INTRAVENOUS
  Filled 2021-04-16: qty 4

## 2021-04-16 MED ORDER — METOPROLOL TARTRATE 25 MG PO TABS
12.5000 mg | ORAL_TABLET | Freq: Two times a day (BID) | ORAL | Status: DC
  Administered 2021-04-16 – 2021-04-20 (×8): 12.5 mg via ORAL
  Filled 2021-04-16 (×9): qty 1

## 2021-04-16 NOTE — Progress Notes (Signed)
Physical Therapy Treatment Patient Details Name: Ian King MRN: [id number] DOB: 1941/08/06 Today's Date: 04/16/2021    History of Present Illness Pt is an 80 y.o. male presenting to hospital 7/12 s/p mechanical fall (pt got up without assistance); c/o R flank pain.  Pt admitted with multiple R sided nondisplaced rib fx's; also displaced fx posterolateral aspect R 7th rib (no pneumothorax noted).  Imaging showing concern for L atrial appendage thrombus (cardiology consulted).  PMH includes dementia, a-fib on Eliquis, DM, htn, CVS, CAD, back trauma s/p spinal surgery, stroke, Raynaud phenomenon, ankylosing spondylitis, seizure disorder, arm sx, hip replacement, knee replacement.    PT Comments    Pt received in bed hesitant to participate with PT but after encouragement and importance for OOB mobility, pt willing to participate. modA+2 with hand over hand cues to sit EOB. Continues to display posterior and L biased L lat lean requiring modA+1 and multimodal cuing to maintain upright static sitting balance. Pt able to sit upright in ~15 sec increments before beginning to return to lean. ModA+2 STS to RW x2 seated rest b/t bouts with hand over hand facilitation to amb 5' within room on second STS attempt. Pt returned to sitting EOB with maxA+2 to return to supine. Pt functionally able to mobilize with assist but requires extensive encouragement to participate and most likely limited in mobility due to cognition. Updated d/c recs to SNF remain appropriate to address declined functional mobility and need for 24/7 supervision/assistance.   Follow Up Recommendations  Supervision/Assistance - 24 hour;SNF     Equipment Recommendations  None recommended by PT;Other (comment) (defer to next venue of care.)    Recommendations for Other Services       Precautions / Restrictions Precautions Precautions: Fall Restrictions Weight Bearing Restrictions: No    Mobility  Bed Mobility Overal bed  mobility: Needs Assistance Bed Mobility: Supine to Sit     Supine to sit: Mod assist;HOB elevated     General bed mobility comments: Mod-MaxA for LE management to return to bed.    Transfers Overall transfer level: Needs assistance Equipment used: Rolling walker (2 wheeled) Transfers: Sit to/from Stand Sit to Stand: Mod assist;+2 physical assistance;+2 safety/equipment            Ambulation/Gait Ambulation/Gait assistance: Min guard;+2 safety/equipment Gait Distance (Feet): 5 Feet Assistive device: Rolling walker (2 wheeled) Gait Pattern/deviations: Shuffle;Leaning posteriorly;Drifts right/left     General Gait Details: Mod to max VC's for keeping hands on RW.   Stairs             Wheelchair Mobility    Modified Rankin (Stroke Patients Only)       Balance Overall balance assessment: Needs assistance Sitting-balance support: Feet supported;Bilateral upper extremity supported Sitting balance-Leahy Scale: Poor Sitting balance - Comments: posterior lean and L lat lean requiring intermittent modA+1 to correct. Able to correct and hold for 10-15 sec. Cognition may be limiting factor. Postural control: Left lateral lean Standing balance support: Bilateral upper extremity supported Standing balance-Leahy Scale: Poor                              Cognition Arousal/Alertness: Awake/alert Behavior During Therapy: Flat affect Overall Cognitive Status: History of cognitive impairments - at baseline                                 General Comments: Requires multiple  attempts of encouragement to participate in therapy.      Exercises      General Comments        Pertinent Vitals/Pain Pain Assessment: Faces Faces Pain Scale: Hurts a little bit Pain Location: R sided ribs (posterior) Pain Descriptors / Indicators: Grimacing;Guarding Pain Intervention(s): Limited activity within patient's tolerance;Monitored during  session;Repositioned    Home Living                      Prior Function            PT Goals (current goals can now be found in the care plan section) Acute Rehab PT Goals Patient Stated Goal: to go home PT Goal Formulation: With patient/family Time For Goal Achievement: 04/28/21 Potential to Achieve Goals: Poor Progress towards PT goals: Progressing toward goals    Frequency    Min 2X/week      PT Plan Current plan remains appropriate    Co-evaluation              AM-PAC PT "6 Clicks" Mobility   Outcome Measure  Help needed turning from your back to your side while in a flat bed without using bedrails?: A Lot Help needed moving from lying on your back to sitting on the side of a flat bed without using bedrails?: A Lot Help needed moving to and from a bed to a chair (including a wheelchair)?: A Lot Help needed standing up from a chair using your arms (e.g., wheelchair or bedside chair)?: A Lot Help needed to walk in hospital room?: A Lot Help needed climbing 3-5 steps with a railing? : A Lot 6 Click Score: 12    End of Session Equipment Utilized During Treatment: Gait belt Activity Tolerance: Other (comment) (limited by cognition) Patient left: in bed;with call bell/phone within reach;with family/visitor present Nurse Communication: Mobility status PT Visit Diagnosis: Other abnormalities of gait and mobility (R26.89);Muscle weakness (generalized) (M62.81);History of falling (Z91.81)     Time: 4166-0630 PT Time Calculation (min) (ACUTE ONLY): 21 min  Charges:  $Therapeutic Activity: 8-22 mins                     Delphia Grates. Fairly IV, PT, DPT Physical Therapist-   [facility]  04/16/2021, 4:17 PM

## 2021-04-16 NOTE — Progress Notes (Signed)
Patient Name: Ian King Date of Encounter: 04/16/2021  Hospital Problem List     Principal Problem:   Fall at home, initial encounter Active Problems:   CAD (coronary artery disease)   Chronic atrial fibrillation (HCC)   Diabetes mellitus without complication (HCC)   HTN (hypertension)   Hyperlipidemia   Seizure disorder (HCC)   Stroke (HCC)   Dementia (HCC)   Right rib fracture   Renal lesion    Patient Profile      79 y.o. male with history of coronary artery disease status post coronary artery bypass grafting x4 in 2005, dementia, history of postoperative atrial flutter status post DCCV x2 now in chronic A. fib rate controlled and anticoagulated with Eliquis history of diabetes, hypertension and seizure disorder.  He was hospitalized at [facility] in the spring with a CVA.  He was last seen at [facility] in April 2022.  Rate is controlled with metoprolol tartrate 25 mg twice daily.  He is on a statin and has had no angina.  He also has a history of hypertension treated with ramipril 10 mg twice daily and the metoprolol.  Patient apparently had a fall while his wife was not watching him.  He was trying to get up without assistance and fell from a standing position.  He usually requires assistance to get up.  There is no evidence that he had syncope.  He complained of right flank pain on presentation to the emergency room.  He is compliant with his apixaban at 5 mg twice daily as well as atorvastatin 80 mg daily, Lasix.  Chest abdomen pelvis CT revealed multiple acute right-sided rib fractures which were nondisplaced.  No pneumothorax.  Cardiomegaly with biatrial enlargement.  Felt to have a filling defect at the tip of the left atrial appendage.  Again it is noted the patient is on full dose Eliquis.  EKG shows atrial fibrillation with controlled ventricular response.  Laboratories revealed normal potassium.  Creatinine is normal.  Subjective   Poor historian. Tele reveals no further pauses  but rate has increased.   Inpatient Medications     acetaminophen  650 mg Oral Q6H   apixaban  5 mg Oral BID   atorvastatin  80 mg Oral Q24H   cholecalciferol  1,000 Units Oral Daily   feeding supplement  237 mL Oral BID BM   furosemide  20 mg Oral Once per day on Sun Tue Thu Sat   hydrocerin  1 application Topical BID   insulin aspart  0-5 Units Subcutaneous QHS   insulin aspart  0-6 Units Subcutaneous TID WC   ketorolac  30 mg Intravenous Q6H   levETIRAcetam  1,000 mg Oral BID   lidocaine  1 patch Transdermal Q24H   multivitamin with minerals  1 tablet Oral Daily   omega-3 acid ethyl esters  1 g Oral Daily   QUEtiapine  50 mg Oral QHS   ramipril  10 mg Oral BID   rivastigmine  1.5 mg Oral BID   triamcinolone cream  1 application Topical BID    Vital Signs    Vitals:   04/15/21 1607 04/15/21 2004 04/15/21 2313 04/16/21 0425  BP: (!) 152/79 (!) 173/96 (!) 128/57 117/79  Pulse: 62 72 78 73  Resp: Temp: 98.2 F (36.8 C) 98.1 F (36.7 C) 97.9 F (36.6 C) 97.6 F (36.4 C)  TempSrc:      SpO2: 97% 98% 91% 93%    Intake/Output Summary (Last  24 hours) at 04/16/2021 0733 Last data filed at 04/16/2021 0425 Gross per 24 hour  Intake 480 ml  Output 100 ml  Net 380 ml   There were no vitals filed for this visit.  Physical Exam    GEN: Well nourished, well developed, in no acute distress.  HEENT: normal.  Neck: Supple, no JVD, carotid bruits, or masses. Cardiac: irr, irr Respiratory:  Respirations regular and unlabored, clear to auscultation bilaterally. GI: Soft, nontender, nondistended, BS + x 4. MS: no deformity or atrophy. Skin: warm and dry, no rash. Neuro:  poor historian  Labs    CBC Recent Labs    04/15/21 0432 04/16/21 0436  WBC 6.4 6.1  HGB 11.3* 11.7*  HCT 34.0* 35.9*  MCV 95.0 95.2  PLT 128* 132*   Basic Metabolic Panel Recent Labs    96/01/5406/14/22 0432 04/16/21 0436  NA 141 139  K 3.5 3.8  CL 108 105  CO2 27 29  GLUCOSE 142*  135*  BUN 11 14  CREATININE 0.68 0.79  CALCIUM 10.0 10.0  MG  --  1.5*   Liver Function Tests No results for input(s): AST, ALT, ALKPHOS, BILITOT, PROT, ALBUMIN in the last 72 hours. No results for input(s): LIPASE, AMYLASE in the last 72 hours. Cardiac Enzymes No results for input(s): CKTOTAL, CKMB, CKMBINDEX, TROPONINI in the last 72 hours. BNP No results for input(s): BNP in the last 72 hours. D-Dimer No results for input(s): DDIMER in the last 72 hours. Hemoglobin A1C No results for input(s): HGBA1C in the last 72 hours. Fasting Lipid Panel No results for input(s): CHOL, HDL, LDLCALC, TRIG, CHOLHDL, LDLDIRECT in the last 72 hours. Thyroid Function Tests No results for input(s): TSH, T4TOTAL, T3FREE, THYROIDAB in the last 72 hours.  Invalid input(s): FREET3  Telemetry    Afib with variable vr. No prolonged pauses or tachy  ECG    afib  Radiology    CT HEAD WO CONTRAST  Result Date: 04/14/2021 CLINICAL DATA:  Fall EXAM: CT HEAD WITHOUT CONTRAST CT CERVICAL SPINE WITHOUT CONTRAST TECHNIQUE: Multidetector CT imaging of the head and cervical spine was performed following the standard protocol without intravenous contrast. Multiplanar CT image reconstructions of the cervical spine were also generated. COMPARISON:  None. FINDINGS: CT HEAD FINDINGS Brain: There is no mass, hemorrhage or extra-axial collection. There is generalized atrophy without lobar predilection. There is hypoattenuation of the periventricular white matter, most commonly indicating chronic ischemic microangiopathy. Old small vessel infarct of the right caudate head. Vascular: No abnormal hyperdensity of the major intracranial arteries or dural venous sinuses. No intracranial atherosclerosis. Skull: The visualized skull base, calvarium and extracranial soft tissues are normal. Sinuses/Orbits: No fluid levels or advanced mucosal thickening of the visualized paranasal sinuses. No mastoid or middle ear effusion. The  orbits are normal. CT CERVICAL SPINE FINDINGS Alignment: No static subluxation. Facets are aligned. Occipital condyles are normally positioned. Skull base and vertebrae: Ankylosis of the entire length of the cervical spine. No acute fracture. Soft tissues and spinal canal: No prevertebral fluid or swelling. No visible canal hematoma. Disc levels: No advanced spinal canal or neural foraminal stenosis. Upper chest: No pneumothorax, pulmonary nodule or pleural effusion. Other: Normal visualized paraspinal cervical soft tissues. IMPRESSION: 1. No acute abnormality the head or cervical spine. 2. Diffuse cervical ankylosis. 3. 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 Electronically Signed   By: Deatra RobinsonKevin  Herman M.D.   On: 04/14/2021 01:37   CT CERVICAL SPINE WO CONTRAST  Result  Date: 04/14/2021 CLINICAL DATA:  Fall EXAM: CT HEAD WITHOUT CONTRAST CT CERVICAL SPINE WITHOUT CONTRAST TECHNIQUE: Multidetector CT imaging of the head and cervical spine was performed following the standard protocol without intravenous contrast. Multiplanar CT image reconstructions of the cervical spine were also generated. COMPARISON:  None. FINDINGS: CT HEAD FINDINGS Brain: There is no mass, hemorrhage or extra-axial collection. There is generalized atrophy without lobar predilection. There is hypoattenuation of the periventricular white matter, most commonly indicating chronic ischemic microangiopathy. Old small vessel infarct of the right caudate head. Vascular: No abnormal hyperdensity of the major intracranial arteries or dural venous sinuses. No intracranial atherosclerosis. Skull: The visualized skull base, calvarium and extracranial soft tissues are normal. Sinuses/Orbits: No fluid levels or advanced mucosal thickening of the visualized paranasal sinuses. No mastoid or middle ear effusion. The orbits are normal. CT CERVICAL SPINE FINDINGS Alignment: No static subluxation. Facets are aligned. Occipital condyles  are normally positioned. Skull base and vertebrae: Ankylosis of the entire length of the cervical spine. No acute fracture. Soft tissues and spinal canal: No prevertebral fluid or swelling. No visible canal hematoma. Disc levels: No advanced spinal canal or neural foraminal stenosis. Upper chest: No pneumothorax, pulmonary nodule or pleural effusion. Other: Normal visualized paraspinal cervical soft tissues. IMPRESSION: 1. No acute abnormality the head or cervical spine. 2. Diffuse cervical ankylosis. 3. 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 Electronically Signed   By: Deatra Robinson M.D.   On: 04/14/2021 01:37   CT CHEST ABDOMEN PELVIS W CONTRAST  Result Date: 04/14/2021 CLINICAL DATA:  80 year old male with history of trauma from a fall. Back pain. EXAM: CT CHEST, ABDOMEN, AND PELVIS WITH CONTRAST CT OF THE THORACIC SPINE WITHOUT CONTRAST CT OF THE LUMBAR SPINE WITHOUT CONTRAST TECHNIQUE: Multidetector CT imaging of the chest, abdomen and pelvis was performed following the standard protocol during bolus administration of intravenous contrast. Dedicated multiplanar reconstructions of the thoracic and lumbar spine were also generated for evaluation. CONTRAST:  75mL OMNIPAQUE IOHEXOL 300 MG/ML  SOLN COMPARISON:  Thoracic spine CT 12/19/2016. FINDINGS: CT CHEST FINDINGS Cardiovascular: No abnormal high attenuation fluid within the mediastinum to suggest posttraumatic mediastinal hematoma. No evidence of posttraumatic aortic dissection/transection. Heart size is enlarged with biatrial dilatation. Notably, there is a filling defect in the tip of the left atrial appendage (axial image 29 of series 2), concerning for thrombus. There is no significant pericardial fluid, thickening or pericardial calcification. There is aortic atherosclerosis, as well as atherosclerosis of the great vessels of the mediastinum and the coronary arteries, including calcified atherosclerotic plaque in the left  main, left anterior descending, left circumflex and right coronary arteries. Status post median sternotomy for CABG including LIMA to the LAD. Thickening calcification of the aortic valve. Calcifications of the mitral annulus. Mediastinum/Nodes: No pathologically enlarged mediastinal or hilar lymph nodes. Esophagus is unremarkable in appearance. No axillary lymphadenopathy. Lungs/Pleura: No pneumothorax. No acute consolidative airspace disease. Trace amount of pleural fluid and/or thickening in the posterolateral aspect of the right hemithorax adjacent to rib fractures (discussed below). No frank hemothorax. No left pleural fluid collection. A few scattered 1-2 mm pulmonary nodules are noted throughout the periphery of the lungs bilaterally, nonspecific. Musculoskeletal: Displaced acute fracture of the posterolateral aspect of the right seventh rib. Nondisplaced acute fracture of the posterior aspect of the right eighth rib. Old healed fracture of the posterolateral right eighth rib. Probable nondisplaced fracture of the lateral aspect of the right eighth rib. Acute nondisplaced fracture of the posterior right  ninth rib. Old healed fracture of the posterolateral right ninth rib. Possible nondisplaced fracture of the posterior right tenth rib. Rod and screw fixation hardware in place from T6-T11, with no evidence of hardware fracture or loosening. No acute displaced fractures of the thoracic spine. Mild compression of T2 vertebral body with 10% loss of anterior vertebral body height which appears to be chronic. CT ABDOMEN PELVIS FINDINGS Hepatobiliary: No evidence of acute traumatic injury to the liver. No suspicious cystic or solid hepatic lesions. No intra or extrahepatic biliary ductal dilatation. Gallbladder is normal in appearance. Pancreas: No evidence of acute traumatic injury to the pancreas. No pancreatic mass. No pancreatic ductal dilatation. No pancreatic or peripancreatic fluid collections or inflammatory  changes. Spleen: No evidence of acute traumatic injury to the spleen. Unremarkable. Adrenals/Urinary Tract: No evidence of acute traumatic injury to either kidney or adrenal gland. Multifocal cortical thinning in the kidneys bilaterally, likely sequela of prior infection or infarcts. In the anterolateral aspect of the lower pole of the left kidney (axial image 72 of series 2). There is an exophytic 4.0 x 2.7 cm intermediate attenuation (40 Hounsfield unit) lesion which is incompletely characterized on today's examination. Calcifications in both renal hila appear to be vascular. Bilateral adrenal glands are normal in appearance. No hydroureteronephrosis. Urinary bladder is partially obscured by extensive beam hardening artifact from the patient's left hip arthroplasty. With this limitation in mind, the urinary bladder appears intact and normal in appearance. Stomach/Bowel: No definitive evidence of significant acute traumatic injury to the hollow viscera. The appearance of the stomach is normal. No pathologic dilatation of small bowel or colon. 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 Normal appendix. Vascular/Lymphatic: No evidence of significant acute traumatic injury to the abdominal aorta or major arteries/veins of the abdomen or pelvis. Aortic atherosclerosis, without evidence of aneurysm or dissection in the abdominal or pelvic vasculature. No lymphadenopathy noted in the abdomen or pelvis. Reproductive: Prostate gland and seminal vesicles are largely obscured by beam hardening artifact from the patient's left hip arthroplasty. Other: No high attenuation fluid collection noted within the peritoneal cavity or retroperitoneum to suggest significant posttraumatic hemorrhage. No significant volume of ascites. No pneumoperitoneum. Musculoskeletal: No acute displaced fractures or aggressive appearing lytic or blastic lesions are  noted in the visualized portions of the skeleton. Dedicated imaging of the lumbar spine demonstrates a chronic appearing compression fracture of L1 with 25% loss of anterior vertebral body height. Status post left hip arthroplasty. IMPRESSION: 1. Multiple acute right-sided rib fractures. Most of these are nondisplaced as detailed above, however, there is a displaced fracture of the posterolateral aspect of the right seventh rib. This is associated with a trace volume of right-sided pleural fluid (not a hemothorax at this time) or thickening, but no pneumothorax noted. 2. No other evidence of significant acute traumatic injury to the abdomen or pelvis. 3. Cardiomegaly with biatrial dilatation. Notably, there is a small filling defect in the tip of the left atrial appendage concerning for left atrial appendage thrombus. If present, this would place the patient at risk for systemic embolization. Further evaluation with nonemergent transesophageal echocardiography is recommended in the near future if clinically appropriate. 4. Indeterminate lesion in the lateral aspect of the lower pole of the left kidney. This may simply represent a proteinaceous/hemorrhagic cysts, however, further evaluation with nonemergent abdominal MRI with and without IV gadolinium is recommended in the near future to exclude neoplasm. 5. Aortic atherosclerosis, in addition  to left main and 3 vessel coronary artery disease. Status post median sternotomy for CABG including LIMA to the LAD. 6. There are calcifications of the aortic valve and mitral annulus. Echocardiographic correlation for evaluation of potential valvular dysfunction may be warranted if clinically indicated. Multiple tiny 1-2 mm pulmonary nodules scattered throughout the periphery of the lungs bilaterally, nonspecific, but statistically likely benign areas of mucoid impaction within terminal bronchioles. No follow-up needed if patient is low-risk (and has no known or suspected  primary neoplasm). Non-contrast chest CT can be considered in 12 months if patient is high-risk. This recommendation follows the consensus statement: Guidelines for Management of Incidental Pulmonary Nodules Detected on CT Images: From the Fleischner Society 2017; Radiology 2017; 284:228-243. 7. Additional incidental findings, as above. Electronically Signed   By: Trudie Reed M.D.   On: 04/14/2021 07:03   CT T-SPINE NO CHARGE  Result Date: 04/14/2021 CLINICAL DATA:  80 year old male with history of trauma from a fall. Back pain. EXAM: CT CHEST, ABDOMEN, AND PELVIS WITH CONTRAST CT OF THE THORACIC SPINE WITHOUT CONTRAST CT OF THE LUMBAR SPINE WITHOUT CONTRAST TECHNIQUE: Multidetector CT imaging of the chest, abdomen and pelvis was performed following the standard protocol during bolus administration of intravenous contrast. Dedicated multiplanar reconstructions of the thoracic and lumbar spine were also generated for evaluation. CONTRAST:  75mL OMNIPAQUE IOHEXOL 300 MG/ML  SOLN COMPARISON:  Thoracic spine CT 12/19/2016. FINDINGS: CT CHEST FINDINGS Cardiovascular: No abnormal high attenuation fluid within the mediastinum to suggest posttraumatic mediastinal hematoma. No evidence of posttraumatic aortic dissection/transection. Heart size is enlarged with biatrial dilatation. Notably, there is a filling defect in the tip of the left atrial appendage (axial image 29 of series 2), concerning for thrombus. There is no significant pericardial fluid, thickening or pericardial calcification. There is aortic atherosclerosis, as well as atherosclerosis of the great vessels of the mediastinum and the coronary arteries, including calcified atherosclerotic plaque in the left main, left anterior descending, left circumflex and right coronary arteries. Status post median sternotomy for CABG including LIMA to the LAD. Thickening calcification of the aortic valve. Calcifications of the mitral annulus. Mediastinum/Nodes: No  pathologically enlarged mediastinal or hilar lymph nodes. Esophagus is unremarkable in appearance. No axillary lymphadenopathy. Lungs/Pleura: No pneumothorax. No acute consolidative airspace disease. Trace amount of pleural fluid and/or thickening in the posterolateral aspect of the right hemithorax adjacent to rib fractures (discussed below). No frank hemothorax. No left pleural fluid collection. A few scattered 1-2 mm pulmonary nodules are noted throughout the periphery of the lungs bilaterally, nonspecific. Musculoskeletal: Displaced acute fracture of the posterolateral aspect of the right seventh rib. Nondisplaced acute fracture of the posterior aspect of the right eighth rib. Old healed fracture of the posterolateral right eighth rib. Probable nondisplaced fracture of the lateral aspect of the right eighth rib. Acute nondisplaced fracture of the posterior right ninth rib. Old healed fracture of the posterolateral right ninth rib. Possible nondisplaced fracture of the posterior right tenth rib. Rod and screw fixation hardware in place from T6-T11, with no evidence of hardware fracture or loosening. No acute displaced fractures of the thoracic spine. Mild compression of T2 vertebral body with 10% loss of anterior vertebral body height which appears to be chronic. CT ABDOMEN PELVIS FINDINGS Hepatobiliary: No evidence of acute traumatic injury to the liver. No suspicious cystic or solid hepatic lesions. No intra or extrahepatic biliary ductal dilatation. Gallbladder is normal in appearance. Pancreas: No evidence of acute traumatic injury to the pancreas. No pancreatic mass. No  pancreatic ductal dilatation. No pancreatic or peripancreatic fluid collections or inflammatory changes. Spleen: No evidence of acute traumatic injury to the spleen. Unremarkable. Adrenals/Urinary Tract: No evidence of acute traumatic injury to either kidney or adrenal gland. Multifocal cortical thinning in the kidneys bilaterally, likely  sequela of prior infection or infarcts. In the anterolateral aspect of the lower pole of the left kidney (axial image 72 of series 2). There is an exophytic 4.0 x 2.7 cm intermediate attenuation (40 Hounsfield unit) lesion which is incompletely characterized on today's examination. Calcifications in both renal hila appear to be vascular. Bilateral adrenal glands are normal in appearance. No hydroureteronephrosis. Urinary bladder is partially obscured by extensive beam hardening artifact from the patient's left hip arthroplasty. With this limitation in mind, the urinary bladder appears intact and normal in appearance. Stomach/Bowel: No definitive evidence of significant acute traumatic injury to the hollow viscera. The appearance of the stomach is normal. No pathologic dilatation of small bowel or colon. 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 Normal appendix. Vascular/Lymphatic: No evidence of significant acute traumatic injury to the abdominal aorta or major arteries/veins of the abdomen or pelvis. Aortic atherosclerosis, without evidence of aneurysm or dissection in the abdominal or pelvic vasculature. No lymphadenopathy noted in the abdomen or pelvis. Reproductive: Prostate gland and seminal vesicles are largely obscured by beam hardening artifact from the patient's left hip arthroplasty. Other: No high attenuation fluid collection noted within the peritoneal cavity or retroperitoneum to suggest significant posttraumatic hemorrhage. No significant volume of ascites. No pneumoperitoneum. Musculoskeletal: No acute displaced fractures or aggressive appearing lytic or blastic lesions are noted in the visualized portions of the skeleton. Dedicated imaging of the lumbar spine demonstrates a chronic appearing compression fracture of L1 with 25% loss of anterior vertebral body height. Status post left hip arthroplasty. IMPRESSION:  1. Multiple acute right-sided rib fractures. Most of these are nondisplaced as detailed above, however, there is a displaced fracture of the posterolateral aspect of the right seventh rib. This is associated with a trace volume of right-sided pleural fluid (not a hemothorax at this time) or thickening, but no pneumothorax noted. 2. No other evidence of significant acute traumatic injury to the abdomen or pelvis. 3. Cardiomegaly with biatrial dilatation. Notably, there is a small filling defect in the tip of the left atrial appendage concerning for left atrial appendage thrombus. If present, this would place the patient at risk for systemic embolization. Further evaluation with nonemergent transesophageal echocardiography is recommended in the near future if clinically appropriate. 4. Indeterminate lesion in the lateral aspect of the lower pole of the left kidney. This may simply represent a proteinaceous/hemorrhagic cysts, however, further evaluation with nonemergent abdominal MRI with and without IV gadolinium is recommended in the near future to exclude neoplasm. 5. Aortic atherosclerosis, in addition to left main and 3 vessel coronary artery disease. Status post median sternotomy for CABG including LIMA to the LAD. 6. There are calcifications of the aortic valve and mitral annulus. Echocardiographic correlation for evaluation of potential valvular dysfunction may be warranted if clinically indicated. Multiple tiny 1-2 mm pulmonary nodules scattered throughout the periphery of the lungs bilaterally, nonspecific, but statistically likely benign areas of mucoid impaction within terminal bronchioles. No follow-up needed if patient is low-risk (and has no known or suspected primary neoplasm). Non-contrast chest CT can be considered in 12 months if patient is high-risk. This recommendation follows the consensus statement: Guidelines for Management of Incidental Pulmonary  Nodules Detected on CT Images: From the  Fleischner Society 2017; Radiology 2017; [id number]:[id number]. 7. Additional incidental findings, as above. Electronically Signed   By: Trudie Reed M.D.   On: 04/14/2021 07:03   CT L-SPINE NO CHARGE  Result Date: 04/14/2021 CLINICAL DATA:  80 year old male with history of trauma from a fall. Back pain. EXAM: CT CHEST, ABDOMEN, AND PELVIS WITH CONTRAST CT OF THE THORACIC SPINE WITHOUT CONTRAST CT OF THE LUMBAR SPINE WITHOUT CONTRAST TECHNIQUE: Multidetector CT imaging of the chest, abdomen and pelvis was performed following the standard protocol during bolus administration of intravenous contrast. Dedicated multiplanar reconstructions of the thoracic and lumbar spine were also generated for evaluation. CONTRAST:  72mL OMNIPAQUE IOHEXOL 300 MG/ML  SOLN COMPARISON:  Thoracic spine CT 12/19/2016. FINDINGS: CT CHEST FINDINGS Cardiovascular: No abnormal high attenuation fluid within the mediastinum to suggest posttraumatic mediastinal hematoma. No evidence of posttraumatic aortic dissection/transection. Heart size is enlarged with biatrial dilatation. Notably, there is a filling defect in the tip of the left atrial appendage (axial image 29 of series 2), concerning for thrombus. There is no significant pericardial fluid, thickening or pericardial calcification. There is aortic atherosclerosis, as well as atherosclerosis of the great vessels of the mediastinum and the coronary arteries, including calcified atherosclerotic plaque in the left main, left anterior descending, left circumflex and right coronary arteries. Status post median sternotomy for CABG including LIMA to the LAD. Thickening calcification of the aortic valve. Calcifications of the mitral annulus. Mediastinum/Nodes: No pathologically enlarged mediastinal or hilar lymph nodes. Esophagus is unremarkable in appearance. No axillary lymphadenopathy. Lungs/Pleura: No pneumothorax. No acute consolidative airspace disease. Trace amount of pleural fluid and/or  thickening in the posterolateral aspect of the right hemithorax adjacent to rib fractures (discussed below). No frank hemothorax. No left pleural fluid collection. A few scattered 1-2 mm pulmonary nodules are noted throughout the periphery of the lungs bilaterally, nonspecific. Musculoskeletal: Displaced acute fracture of the posterolateral aspect of the right seventh rib. Nondisplaced acute fracture of the posterior aspect of the right eighth rib. Old healed fracture of the posterolateral right eighth rib. Probable nondisplaced fracture of the lateral aspect of the right eighth rib. Acute nondisplaced fracture of the posterior right ninth rib. Old healed fracture of the posterolateral right ninth rib. Possible nondisplaced fracture of the posterior right tenth rib. Rod and screw fixation hardware in place from T6-T11, with no evidence of hardware fracture or loosening. No acute displaced fractures of the thoracic spine. Mild compression of T2 vertebral body with 10% loss of anterior vertebral body height which appears to be chronic. CT ABDOMEN PELVIS FINDINGS Hepatobiliary: No evidence of acute traumatic injury to the liver. No suspicious cystic or solid hepatic lesions. No intra or extrahepatic biliary ductal dilatation. Gallbladder is normal in appearance. Pancreas: No evidence of acute traumatic injury to the pancreas. No pancreatic mass. No pancreatic ductal dilatation. No pancreatic or peripancreatic fluid collections or inflammatory changes. Spleen: No evidence of acute traumatic injury to the spleen. Unremarkable. Adrenals/Urinary Tract: No evidence of acute traumatic injury to either kidney or adrenal gland. Multifocal cortical thinning in the kidneys bilaterally, likely sequela of prior infection or infarcts. In the anterolateral aspect of the lower pole of the left kidney (axial image 72 of series 2). There is an exophytic 4.0 x 2.7 cm intermediate attenuation (40 Hounsfield unit) lesion which is  incompletely characterized on today's examination. Calcifications in both renal hila appear to be vascular. Bilateral adrenal glands are normal in appearance. No hydroureteronephrosis. Urinary bladder  is partially obscured by extensive beam hardening artifact from the patient's left hip arthroplasty. With this limitation in mind, the urinary bladder appears intact and normal in appearance. Stomach/Bowel: No definitive evidence of significant acute traumatic injury to the hollow viscera. The appearance of the stomach is normal. No pathologic dilatation of small bowel or colon. 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 Normal appendix. Vascular/Lymphatic: No evidence of significant acute traumatic injury to the abdominal aorta or major arteries/veins of the abdomen or pelvis. Aortic atherosclerosis, without evidence of aneurysm or dissection in the abdominal or pelvic vasculature. No lymphadenopathy noted in the abdomen or pelvis. Reproductive: Prostate gland and seminal vesicles are largely obscured by beam hardening artifact from the patient's left hip arthroplasty. Other: No high attenuation fluid collection noted within the peritoneal cavity or retroperitoneum to suggest significant posttraumatic hemorrhage. No significant volume of ascites. No pneumoperitoneum. Musculoskeletal: No acute displaced fractures or aggressive appearing lytic or blastic lesions are noted in the visualized portions of the skeleton. Dedicated imaging of the lumbar spine demonstrates a chronic appearing compression fracture of L1 with 25% loss of anterior vertebral body height. Status post left hip arthroplasty. IMPRESSION: 1. Multiple acute right-sided rib fractures. Most of these are nondisplaced as detailed above, however, there is a displaced fracture of the posterolateral aspect of the right seventh rib. This is associated with a trace volume of  right-sided pleural fluid (not a hemothorax at this time) or thickening, but no pneumothorax noted. 2. No other evidence of significant acute traumatic injury to the abdomen or pelvis. 3. Cardiomegaly with biatrial dilatation. Notably, there is a small filling defect in the tip of the left atrial appendage concerning for left atrial appendage thrombus. If present, this would place the patient at risk for systemic embolization. Further evaluation with nonemergent transesophageal echocardiography is recommended in the near future if clinically appropriate. 4. Indeterminate lesion in the lateral aspect of the lower pole of the left kidney. This may simply represent a proteinaceous/hemorrhagic cysts, however, further evaluation with nonemergent abdominal MRI with and without IV gadolinium is recommended in the near future to exclude neoplasm. 5. Aortic atherosclerosis, in addition to left main and 3 vessel coronary artery disease. Status post median sternotomy for CABG including LIMA to the LAD. 6. There are calcifications of the aortic valve and mitral annulus. Echocardiographic correlation for evaluation of potential valvular dysfunction may be warranted if clinically indicated. Multiple tiny 1-2 mm pulmonary nodules scattered throughout the periphery of the lungs bilaterally, nonspecific, but statistically likely benign areas of mucoid impaction within terminal bronchioles. No follow-up needed if patient is low-risk (and has no known or suspected primary neoplasm). Non-contrast chest CT can be considered in 12 months if patient is high-risk. This recommendation follows the consensus statement: Guidelines for Management of Incidental Pulmonary Nodules Detected on CT Images: From the Fleischner Society 2017; Radiology 2017; 284:228-243. 7. Additional incidental findings, as above. Electronically Signed   By: Trudie Reed M.D.   On: 04/14/2021 07:03   DG Hip Unilat  With Pelvis 2-3 Views Right  Result Date:  04/14/2021 CLINICAL DATA:  Hip pain, fall EXAM: DG HIP (WITH OR WITHOUT PELVIS) 2-3V RIGHT COMPARISON:  None. FINDINGS: The osseous structures appear diffusely demineralized which may limit detection of small or nondisplaced fractures. Bones of the pelvis appear intact and congruent. Arcuate lines are contiguous. 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 Additional degenerative changes in the lower lumbar spine and bilateral SI joints. Enthesopathic changes noted about the pelvis. IMPRESSION: No discernible acute fracture or traumatic osseous injury. Prior left hip arthroplasty in expected positioning. Electronically Signed   By: Kreg Shropshire M.D.   On: 04/14/2021 01:01    Assessment & Plan    80 y.o. male with history of coronary artery disease status post coronary artery bypass grafting x4 in 2005, dementia, history of postoperative atrial flutter status post DCCV x2 now in chronic A. fib rate controlled and anticoagulated with Eliquis history of diabetes, hypertension and seizure disorder.  He was hospitalized at [facility] in the spring with a CVA.  He was last seen at [facility] in April 2022.  Rate is controlled with metoprolol tartrate 25 mg twice daily.  He is on a statin and has had no angina.  He also has a history of hypertension treated with ramipril 10 mg twice daily and the metoprolol.  Patient apparently had a fall while his wife was not watching him.  He was trying to get up without assistance and fell from a standing position.  He usually requires assistance to get up.  There is no evidence that he had syncope.  He complained of right flank pain on presentation to the emergency room.  He is compliant with his apixaban at 5 mg twice daily as well as atorvastatin 80 mg daily,  Lasix.  Chest abdomen pelvis CT revealed multiple acute right-sided rib fractures which were nondisplaced.  No pneumothorax.  Cardiomegaly with biatrial enlargement.  Felt to have a filling defect at the tip of the left atrial appendage.  Again it is noted the patient is on full dose Eliquis.  EKG shows atrial fibrillation with controlled ventricular response.  Laboratories revealed normal potassium.  Creatinine is normal.  1.  Atrial fibrillation-continue with rate control with metoprolol.  Has failed DCCV x2 and has been chronically anticoagulated with Eliquis at full dose 5 mg twice daily.  We will continue with rate control and Eliquis.  Left atrial appendage filling defect at the tip.  This was noted on CT.  Given the fact patient is on full dose Eliquis as well as in A. fib, do not feel that TEE will change our treatment protocol.  And transthoracic echo will not change current treatment recommended as it will not visualize the we will continue with Eliquis and follow.  Will carefully add back low dose lopressor.   2.  Mechanical fall-does not appear to be secondary to syncope.  Chest pain appears to be related to rib fractures.  Will follow.  Would agree with physical therapy.  3.  Rib fractures-seventh rib is displaced somewhat.  No pneumothorax. Will need SNF  Dr. Juliann Pares will be covering this weekend.   Signed, Darlin Priestly Yun Gutierrez MD 04/16/2021, 7:33 AM  Pager: [phone]

## 2021-04-16 NOTE — TOC Progression Note (Addendum)
Transition of Care [facility]) - Progression Note    Patient Details  Name: Ian King MRN: [id number] Date of Birth: [birth year]  Transition of Care [facility]) CM/SW [place], RN Phone Number: 04/16/2021, 1:43 PM  Clinical Narrative:    Met with the partient and his wife [facility], Insurance auth was started, Ref number [id number]   Expected Discharge Plan: [place] Barriers to Discharge: Barriers Resolved  Expected Discharge Plan and Services Expected Discharge Plan: [place]   Discharge Planning Services: CM Consult   Living arrangements for the past 2 months: Single Family Home                 DME Arranged: N/A         HH Arranged: PT, OT HH Agency: [facility] (now [facility]) Date [place]: 04/15/21 Time [place]: [phone] Representative spoke with at [place]: [place] Determinants of Health ([place]) Interventions    Readmission Risk Interventions No flowsheet data found.

## 2021-04-16 NOTE — TOC Progression Note (Signed)
Transition of Care [facility]) - Progression Note    Patient Details  Name: GRADYN SHEIN MRN: [id number] Date of Birth: 09-Apr-1941  Transition of Care [facility]) CM/SW Contact  Barrie Dunker, RN Phone Number: 04/16/2021, 8:41 AM  Clinical Narrative:    [facility] out to [facility] which is the [facility]'s preferred [facility], Requested them to make a bed offer if possible, Patient has had only 1 Covid Booster but is agreeable to a second booster prior to Discharge to be able to go to SNF without Daleen Snook, Will review bed offers once obtained   Expected Discharge Plan: Home w Home Health Services Barriers to Discharge: Barriers Resolved  Expected Discharge Plan and Services Expected Discharge Plan: Home w Home Health Services   Discharge Planning Services: CM Consult   Living arrangements for the past 2 months: Single Family Home                 DME Arranged: N/A         HH Arranged: PT, OT HH Agency: [facility] (now [facility]) Date HH Agency Contacted: 04/15/21 Time HH Agency Contacted: [phone] Representative spoke with at [facility] Agency: [place]   Social Determinants of Health (SDOH) Interventions    Readmission Risk Interventions No flowsheet data found.

## 2021-04-16 NOTE — Progress Notes (Addendum)
[facility] Progress Note  Patient: Ian King    HWY:[id number]  DOA: 04/14/2021     Date of Service: the patient was seen and examined on 04/16/2021  Brief hospital course: Past medical history of HTN, HLD, DM, CVA, gout, depression, chronic A. fib, seizure, dementia, COVID-19 May 22.  Presents with complaints of a mechanical fall.  Found to have right-sided rib fracture. Cardiology consulted for incidental atrial thrombus seen on the CT scan. Currently plan is pain control and monitor for improvement in mentation as well as ambulation.  Subjective: Somewhat confused.  No nausea no vomiting.  Also cough with shortness of breath.  No fever no chills.  No diarrhea.  No constipation.  Minimal oral intake.  Assessment and Plan: 1.  Mechanical fall. Acute right rib fracture CT head as well as C-spine negative for any acute injury. CT chest shows acute right-sided rib fractures. Continue incentive spirometry. We will provide scheduled Toradol along with already ordered scheduled Tylenol. Secondary to patient's dementia patient remains confused and develops hallucination both auditory and visual as well as has agitation with higher dose narcotics. Will attempt tramadol and monitor although appears to have caused some confusion already. Patient worked with physical therapy with minimal improvement in his ability to walk today.  SNF still recommended.  2.  Left atrial thrombus History of chronic A. fib On chronic anticoagulation Bradycardic events on telemetry CT incidentally shows left atrial appendage thrombus. Cardiology consulted. Patient currently on Eliquis. Further work-up will not change management and I agree with that and therefore continue with Eliquis. Also patient was on Lopressor but overnight had bradycardic events while sleeping on telemetry. Lopressor is currently on hold. Awaiting further recommendation from cardiology. Patient may benefit from outpatient ZIO  monitor. Started back on Lopressor.  3.  CAD, HLD, HTN Not on any antiplatelet medication.  Monitor.  Currently Lopressor on hold.  Blood pressure stable. Lipitor continue.  4.  Type 2 diabetes mellitus, well controlled.  With HLD.  No long-term insulin use. Currently on sliding scale insulin.  5.  History of seizure disorder On Keppra.  Continue.  6.  Dementia On Exelon patch as well as Seroquel.  Exelon patch can likely cause bradycardic events. Intermittently confused at home but continues to have now worsening confusion in the setting of ongoing pain and requiring pain medication For now we will continue.  7.  Renal lesion Indeterminant lesion on left kidney lower pole. Nonemergent MRI with and without contrast is recommended to exclude neoplasm.  8.  Pulmonary nodule. Noncontrast CT chest can be considered in 12 months.  9.  Chronic diastolic CHF. Mild volume overload. Will provide IV Lasix and monitor.  Scheduled Meds:  acetaminophen  650 mg Oral Q6H   apixaban  5 mg Oral BID   atorvastatin  80 mg Oral Q24H   cholecalciferol  1,000 Units Oral Daily   feeding supplement  237 mL Oral BID BM   furosemide  20 mg Oral Once per day on Sun Tue Thu Sat   hydrocerin  1 application Topical BID   insulin aspart  0-5 Units Subcutaneous QHS   insulin aspart  0-6 Units Subcutaneous TID WC   ketorolac  15 mg Intravenous Q6H   levETIRAcetam  1,000 mg Oral BID   lidocaine  1 patch Transdermal Q24H   metoprolol tartrate  12.5 mg Oral BID   multivitamin with minerals  1 tablet Oral Daily   omega-3 acid ethyl esters  1 g Oral Daily  QUEtiapine  50 mg Oral QHS   ramipril  10 mg Oral BID   rivastigmine  1.5 mg Oral BID   triamcinolone cream  1 application Topical BID   Continuous Infusions: PRN Meds: bisacodyl, ondansetron (ZOFRAN) IV, QUEtiapine, senna-docusate, traMADol  There is no height or weight on file to calculate BMI.        DVT Prophylaxis:    apixaban (ELIQUIS)  tablet 5 mg    Advance goals of care discussion: Pt is DNR.  Family Communication: family was present at bedside, at the time of interview.  The pt provided permission to discuss medical plan with the family. Opportunity was given to ask question and all questions were answered satisfactorily.   Data Reviewed: I have personally reviewed and interpreted daily labs, tele strips, imaging. Hemoglobin stable.  Creatinine stable.  Magnesium low.  Chest x-ray shows vascular congestion.  Physical Exam:  General: Appear in moderate respiratory distress, no Rash; Oral Mucosa Clear, moist. no Abnormal Neck Mass Or lumps, Conjunctiva normal  Cardiovascular: S1 and S2 Present, no Murmur, Respiratory: good respiratory effort, Bilateral Air entry present and bilateral basal crackles, no wheezes Abdomen: Bowel Sound present, Soft and no tenderness Extremities: Bilateral pedal edema Neurology: alert and oriented to place, and person affect appropriate. no new focal deficit Later in the afternoon somewhat confused requiring Seroquel. Gait not checked due to patient safety concerns   Vitals:   04/15/21 2313 04/16/21 0425 04/16/21 0826 04/16/21 1616  BP: (!) 128/57 117/79 (!) 168/106 (!) 145/79  Pulse: 78 73 82 (!) 43  Resp: 17 20 20 18   Temp: 97.9 F (36.6 C) 97.6 F (36.4 C) 98.5 F (36.9 C) 97.9 F (36.6 C)  TempSrc:   Oral   SpO2: 91% 93% 100% 97%    Disposition:  Status is: Inpatient  Remains inpatient appropriate because:Ongoing active pain requiring inpatient pain management, Altered mental status, and Unsafe d/c plan continues to have severe pain as well as confusion.  Unsafe discharge as lives with wife and requires 2+ assist for minimal ambulation.  Dispo: The patient is from: Home              Anticipated d/c is to: SNF              Patient currently is not medically stable to d/c.   Difficult to place patient No   Author: , MD [facility] 04/16/2021 6:16  PM  To reach On-call, see care teams to locate the attending and reach out via [email or web address]/18/2021. Between 7PM-7AM, please contact night-coverage If you still have difficulty reaching the attending provider, please page the [facility] (Director on Call) for [facility] on amion for assistance.

## 2021-04-17 DIAGNOSIS — Y92009 Unspecified place in unspecified non-institutional (private) residence as the place of occurrence of the external cause: Secondary | ICD-10-CM | POA: Diagnosis not present

## 2021-04-17 DIAGNOSIS — I10 Essential (primary) hypertension: Secondary | ICD-10-CM | POA: Diagnosis not present

## 2021-04-17 DIAGNOSIS — W19XXXA Unspecified fall, initial encounter: Secondary | ICD-10-CM | POA: Diagnosis not present

## 2021-04-17 LAB — CBC
HCT: 37.3 % — ABNORMAL LOW (ref 39.0–52.0)
Hemoglobin: 12.3 g/dL — ABNORMAL LOW (ref 13.0–17.0)
MCH: 31.3 pg (ref 26.0–34.0)
MCHC: 33 g/dL (ref 30.0–36.0)
MCV: 94.9 fL (ref 80.0–100.0)
Platelets: 144 10*3/uL — ABNORMAL LOW (ref 150–400)
RBC: 3.93 MIL/uL — ABNORMAL LOW (ref 4.22–5.81)
RDW: 14.2 % (ref 11.5–15.5)
WBC: 7 10*3/uL (ref 4.0–10.5)
nRBC: 0 % (ref 0.0–0.2)

## 2021-04-17 LAB — GLUCOSE, CAPILLARY
Glucose-Capillary: 157 mg/dL — ABNORMAL HIGH (ref 70–99)
Glucose-Capillary: 171 mg/dL — ABNORMAL HIGH (ref 70–99)
Glucose-Capillary: 175 mg/dL — ABNORMAL HIGH (ref 70–99)
Glucose-Capillary: 183 mg/dL — ABNORMAL HIGH (ref 70–99)

## 2021-04-17 LAB — BASIC METABOLIC PANEL
Anion gap: 8 (ref 5–15)
BUN: 16 mg/dL (ref 8–23)
CO2: 29 mmol/L (ref 22–32)
Calcium: 9.9 mg/dL (ref 8.9–10.3)
Chloride: 104 mmol/L (ref 98–111)
Creatinine, Ser: 0.79 mg/dL (ref 0.61–1.24)
GFR, Estimated: >60 mL/min (ref >60)
Glucose, Bld: 136 mg/dL — ABNORMAL HIGH (ref 70–99)
Potassium: 3.4 mmol/L — ABNORMAL LOW (ref 3.5–5.1)
Sodium: 141 mmol/L (ref 135–145)

## 2021-04-17 LAB — MAGNESIUM: Magnesium: 1.9 mg/dL (ref 1.7–2.4)

## 2021-04-17 MED ORDER — FUROSEMIDE 20 MG PO TABS
20.0000 mg | ORAL_TABLET | Freq: Every day | ORAL | Status: DC
  Administered 2021-04-18 – 2021-04-20 (×3): 20 mg via ORAL
  Filled 2021-04-17 (×4): qty 1

## 2021-04-17 MED ORDER — LACTULOSE 10 GM/15ML PO SOLN
20.0000 g | Freq: Once | ORAL | Status: AC
  Administered 2021-04-17: 20 g via ORAL

## 2021-04-17 MED ORDER — SENNOSIDES-DOCUSATE SODIUM 8.6-50 MG PO TABS
2.0000 | ORAL_TABLET | Freq: Two times a day (BID) | ORAL | Status: DC
  Administered 2021-04-17 – 2021-04-20 (×7): 2 via ORAL
  Filled 2021-04-17 (×6): qty 2

## 2021-04-17 MED ORDER — ACETAMINOPHEN 500 MG PO TABS
500.0000 mg | ORAL_TABLET | Freq: Four times a day (QID) | ORAL | Status: DC
  Administered 2021-04-17 (×2): 500 mg via ORAL
  Filled 2021-04-17 (×3): qty 1

## 2021-04-17 NOTE — Progress Notes (Signed)
Physical Therapy Treatment Patient Details Name: Ian King MRN: [id number] DOB: 24-Jun-1941 Today's Date: 04/17/2021    History of Present Illness Pt is an 80 y.o. male presenting to hospital 7/12 s/p mechanical fall (pt got up without assistance); c/o R flank pain.  Pt admitted with multiple R sided nondisplaced rib fx's; also displaced fx posterolateral aspect R 7th rib (no pneumothorax noted).  Imaging showing concern for L atrial appendage thrombus (cardiology consulted).  PMH includes dementia, a-fib on Eliquis, DM, htn, CVS, CAD, back trauma s/p spinal surgery, stroke, Raynaud phenomenon, ankylosing spondylitis, seizure disorder, arm sx, hip replacement, knee replacement.    PT Comments    Pt was long sitting in bed with spouse, RN, Hydrologist in room. He has baseline dementia that greatly impacts session progression. Pt was overall much more willing to participate with less encouragement required. He was able to exit L side of bed, stand, and ambulate ~ 12 ft with RW + constant assistance. Will need extensive PT to assist pt to PLOF. Recommend DC to SNF to improve independence with ADLs.     Follow Up Recommendations  Supervision/Assistance - 24 hour;SNF     Equipment Recommendations  None recommended by PT       Precautions / Restrictions Precautions Precautions: Fall Precaution Comments: Seizure precautions Restrictions Weight Bearing Restrictions: No    Mobility  Bed Mobility Overal bed mobility: Needs Assistance Bed Mobility: Supine to Sit     Supine to sit: Mod assist;HOB elevated;Max assist     General bed mobility comments: pt struggles with sequencing and LE progression. needs constant vcs/tactile cues for progression. Did require mod-max of one to achieve EOB short sit, mostly due to cognition, not strength.    Transfers Overall transfer level: Needs assistance Equipment used: Rolling walker (2 wheeled) Transfers: Sit to/from Stand Sit to Stand: Mod  assist;From elevated surface         General transfer comment: Pt stood from slightly elevated bed height with mod assist of one. took a minute for pt to understand desired task requested but once fully participating in task, stood with mod of one.  Ambulation/Gait Ambulation/Gait assistance: Min assist;+2 safety/equipment (chair follow) Gait Distance (Feet): 12 Feet Assistive device: Rolling walker (2 wheeled) Gait Pattern/deviations: Step-to pattern;Shuffle Gait velocity: decreased   General Gait Details: pt was able to ambulate ~ 12 ft towards door. Needs constant vcs for focusing on task but overall tolertated well. distance limited by fatigue     Balance Overall balance assessment: Needs assistance Sitting-balance support: Feet supported;Bilateral upper extremity supported Sitting balance-Leahy Scale: Poor Sitting balance - Comments: has posterior lean in  sitting   Standing balance support: Bilateral upper extremity supported Standing balance-Leahy Scale: Poor Standing balance comment: cognition moreso than balance      Cognition Arousal/Alertness: Awake/alert Behavior During Therapy: WFL for tasks assessed/performed (confused but able to follow commands inconsistently) Overall Cognitive Status: History of cognitive impairments - at baseline        General Comments: Pt required less encouragement today for participation however still needs encouragement. pt has baseline dementia. spouse and hired caregivers take care of pt at home 24/7. [facility] mobility prior to returning home.             Pertinent Vitals/Pain Pain Assessment: No/denies pain Pain Score: 0-No pain     PT Goals (current goals can now be found in the care plan section) Acute Rehab PT Goals Patient Stated Goal: none stated Progress  towards PT goals: Progressing toward goals    Frequency    Min 2X/week      PT Plan Current plan remains appropriate       AM-PAC PT "6  Clicks" Mobility   Outcome Measure  Help needed turning from your back to your side while in a flat bed without using bedrails?: A Little Help needed moving from lying on your back to sitting on the side of a flat bed without using bedrails?: A Lot Help needed moving to and from a bed to a chair (including a wheelchair)?: A Lot Help needed standing up from a chair using your arms (e.g., wheelchair or bedside chair)?: A Lot Help needed to walk in hospital room?: A Lot Help needed climbing 3-5 steps with a railing? : A Lot 6 Click Score: 13    End of Session Equipment Utilized During Treatment: Gait belt Activity Tolerance: Patient tolerated treatment well Patient left: in chair;with call bell/phone within reach;with chair alarm set;with family/visitor present Nurse Communication: Mobility status PT Visit Diagnosis: Other abnormalities of gait and mobility (R26.89);Muscle weakness (generalized) (M62.81);History of falling (Z91.81)     Time: 3754-3606 PT Time Calculation (min) (ACUTE ONLY): 15 min  Charges:  $Gait Training: 8-22 mins                     Jetta Lout PTA 04/17/21, 12:52 PM

## 2021-04-17 NOTE — Progress Notes (Addendum)
[facility] Progress Note  Patient: Ian King    RAQ:[id number]  DOA: 04/14/2021     Date of Service: the patient was seen and examined on 04/17/2021  Brief hospital course: Past medical history of HTN, HLD, DM, CVA, gout, depression, chronic A. fib, seizure, dementia, COVID-19 May 22.  Presents with complaints of a mechanical fall.  Found to have right-sided rib fracture. Cardiology consulted for incidental atrial thrombus seen on the CT scan. Currently plan is pain control and medically stable.  Arrange for SNF.  Subjective: Still confused.  No nausea no vomiting.  Pain adequately controlled with Norco.  No bowel movement since arrival.  Passing gas.  Oral intake adequate.  Assessment and Plan: 1.  Mechanical fall. Acute right rib fracture CT head as well as C-spine negative for any acute injury. CT chest shows acute right-sided rib fractures. Continue incentive spirometry. We will provide scheduled Toradol along with already ordered scheduled Tylenol. Secondary to patient's dementia patient remains confused and develops hallucination both auditory and visual as well as has agitation with higher dose narcotics. Currently on Norco for pain control. Patient worked with physical therapy with minimal improvement in his ability to walk today.  SNF still recommended.  2.  Left atrial thrombus History of chronic A. fib On chronic anticoagulation Bradycardic events on telemetry CT incidentally shows left atrial appendage thrombus. Cardiology consulted. Patient currently on Eliquis. Further work-up will not change management and I agree with that and therefore continue with Eliquis. Patient may benefit from outpatient ZIO monitor. Started back on Lopressor.  3.  CAD, HLD, HTN Not on any antiplatelet medication.  Monitor.  Currently Lopressor on hold.  Blood pressure stable. Lipitor continue.  4.  Type 2 diabetes mellitus, well controlled.  With HLD.  No long-term insulin  use. Currently on sliding scale insulin.  5.  History of seizure disorder On Keppra.  Continue.  6.  Dementia Hospital induced delirium. On Exelon patch as well as Seroquel.   Exelon patch can likely cause bradycardic events. Intermittently confused at home but continues to have now worsening confusion in the setting of ongoing pain and requiring pain medication For now we will continue. Continue Seroquel 25 mg twice daily as needed along with 50 mg nightly.  7.  Renal lesion Indeterminant lesion on left kidney lower pole. Nonemergent MRI with and without contrast is recommended to exclude neoplasm.  8.  Pulmonary nodule. Noncontrast CT chest can be considered in 12 months.  9.  Chronic diastolic CHF. Mild volume overload. Treated with IV Lasix x1.  Increase the Lasix dose to daily. Flutter valve ordered.  Scheduled Meds:  acetaminophen  500 mg Oral Q6H   apixaban  5 mg Oral BID   atorvastatin  80 mg Oral Q24H   cholecalciferol  1,000 Units Oral Daily   feeding supplement  237 mL Oral BID BM   [START ON 04/18/2021] furosemide  20 mg Oral Daily   hydrocerin  1 application Topical BID   insulin aspart  0-5 Units Subcutaneous QHS   insulin aspart  0-6 Units Subcutaneous TID WC   ketorolac  15 mg Intravenous Q6H   levETIRAcetam  1,000 mg Oral BID   lidocaine  1 patch Transdermal Q24H   metoprolol tartrate  12.5 mg Oral BID   multivitamin with minerals  1 tablet Oral Daily   QUEtiapine  50 mg Oral QHS   ramipril  10 mg Oral BID   rivastigmine  1.5 mg Oral BID  senna-docusate  2 tablet Oral BID   Continuous Infusions: PRN Meds: bisacodyl, HYDROcodone-acetaminophen, ondansetron (ZOFRAN) IV, QUEtiapine  There is no height or weight on file to calculate BMI.        DVT Prophylaxis:    apixaban (ELIQUIS) tablet 5 mg    Advance goals of care discussion: Pt is DNR.  Family Communication: family was present at bedside, at the time of interview.  The pt provided  permission to discuss medical plan with the family. Opportunity was given to ask question and all questions were answered satisfactorily.   Data Reviewed: I have personally reviewed and interpreted daily labs, tele strips, imaging. Potassium mildly low 3.4.  Serum creatinine stable.  Hemoglobin stable.  CBC stable.  Physical Exam:  General: Appear in mild distress, no Rash; Oral Mucosa Clear, moist. no Abnormal Neck Mass Or lumps, Conjunctiva normal  Cardiovascular: S1 and S2 Present, no Murmur, Respiratory: good respiratory effort, Bilateral Air entry present and CTA, no Crackles, no wheezes Abdomen: Bowel Sound present, Soft and no tenderness Extremities: no Pedal edema Neurology: alert and oriented to time, place, and person affect appropriate. no new focal deficit Gait not checked due to patient safety concerns   Vitals:   04/16/21 2006 04/17/21 0359 04/17/21 0834 04/17/21 1537  BP: (!) 155/75 (!) 171/92 (!) 161/86 (!) 163/85  Pulse: (!) 54 65 85 70  Resp: 20 14 18 17   Temp: 98.3 F (36.8 C) 97.8 F (36.6 C) 97.7 F (36.5 C) 98.6 F (37 C)  TempSrc:      SpO2: 97% 95% 94% 95%    Disposition:  Status is: Inpatient  Remains inpatient appropriate because:Ongoing active pain requiring inpatient pain management, Altered mental status, and Unsafe d/c plan continues to have severe pain as well as confusion.  Unsafe discharge as lives with wife and requires 2+ assist for minimal ambulation.  Dispo: The patient is from: Home              Anticipated d/c is to: SNF              Patient currently is medically stable to d/c.   Difficult to place patient No   Author: , MD [facility] 04/17/2021 6:53 PM  To reach On-call, see care teams to locate the attending and reach out via [email or web address]. Between 7PM-7AM, please contact night-coverage If you still have difficulty reaching the attending provider, please page the [facility] (Director on Call) for [facility]  on amion for assistance.

## 2021-04-17 NOTE — TOC Progression Note (Signed)
Transition of Care [facility]) - Progression Note    Patient Details  Name: Ian King MRN: [id number] Date of Birth: Jun 07, 1941  [facility] CM/SW Contact  Bing Quarry, RN Phone Number: 04/17/2021, 12:34 PM  Clinical Narrative:  7/16 Per provider notes, not medically stable around 6 pm 7/15. [facility] in hub. Ins. Authorization next review 04/20/21. Gabriel Cirri RN CM     Expected Discharge Plan: Home w Home Health Services Barriers to Discharge: Barriers Resolved  Expected Discharge Plan and Services Expected Discharge Plan: Home w Home Health Services   Discharge Planning Services: CM Consult   Living arrangements for the past 2 months: Single Family Home                 DME Arranged: N/A         HH Arranged: PT, OT HH Agency: [facility] (now [facility]) Date HH Agency Contacted: 04/15/21 Time HH Agency Contacted: [phone] Representative spoke with at [facility] Agency: STacie   Social Determinants of Health (SDOH) Interventions    Readmission Risk Interventions No flowsheet data found.

## 2021-04-18 DIAGNOSIS — Y92009 Unspecified place in unspecified non-institutional (private) residence as the place of occurrence of the external cause: Secondary | ICD-10-CM | POA: Diagnosis not present

## 2021-04-18 DIAGNOSIS — I10 Essential (primary) hypertension: Secondary | ICD-10-CM | POA: Diagnosis not present

## 2021-04-18 DIAGNOSIS — W19XXXA Unspecified fall, initial encounter: Secondary | ICD-10-CM | POA: Diagnosis not present

## 2021-04-18 LAB — BASIC METABOLIC PANEL
Anion gap: 6 (ref 5–15)
BUN: 19 mg/dL (ref 8–23)
CO2: 28 mmol/L (ref 22–32)
Calcium: 9.7 mg/dL (ref 8.9–10.3)
Chloride: 107 mmol/L (ref 98–111)
Creatinine, Ser: 0.79 mg/dL (ref 0.61–1.24)
GFR, Estimated: >60 mL/min (ref >60)
Glucose, Bld: 145 mg/dL — ABNORMAL HIGH (ref 70–99)
Potassium: 3.7 mmol/L (ref 3.5–5.1)
Sodium: 141 mmol/L (ref 135–145)

## 2021-04-18 LAB — GLUCOSE, CAPILLARY
Glucose-Capillary: 186 mg/dL — ABNORMAL HIGH (ref 70–99)
Glucose-Capillary: 217 mg/dL — ABNORMAL HIGH (ref 70–99)
Glucose-Capillary: 186 mg/dL — ABNORMAL HIGH (ref 70–99)
Glucose-Capillary: 198 mg/dL — ABNORMAL HIGH (ref 70–99)

## 2021-04-18 LAB — CBC
HCT: 37.1 % — ABNORMAL LOW (ref 39.0–52.0)
Hemoglobin: 12.3 g/dL — ABNORMAL LOW (ref 13.0–17.0)
MCH: 31.4 pg (ref 26.0–34.0)
MCHC: 33.2 g/dL (ref 30.0–36.0)
MCV: 94.6 fL (ref 80.0–100.0)
Platelets: 147 10*3/uL — ABNORMAL LOW (ref 150–400)
RBC: 3.92 MIL/uL — ABNORMAL LOW (ref 4.22–5.81)
RDW: 14.2 % (ref 11.5–15.5)
WBC: 6.8 10*3/uL (ref 4.0–10.5)
nRBC: 0 % (ref 0.0–0.2)

## 2021-04-18 MED ORDER — FAMOTIDINE 20 MG PO TABS
20.0000 mg | ORAL_TABLET | Freq: Every day | ORAL | Status: DC
  Administered 2021-04-18 – 2021-04-20 (×3): 20 mg via ORAL
  Filled 2021-04-18 (×3): qty 1

## 2021-04-18 MED ORDER — NAPROXEN 500 MG PO TABS
500.0000 mg | ORAL_TABLET | Freq: Two times a day (BID) | ORAL | Status: DC
  Administered 2021-04-18 – 2021-04-20 (×4): 500 mg via ORAL
  Filled 2021-04-18 (×8): qty 1

## 2021-04-18 MED ORDER — ACETAMINOPHEN 500 MG PO TABS
500.0000 mg | ORAL_TABLET | Freq: Three times a day (TID) | ORAL | Status: DC
  Administered 2021-04-18 – 2021-04-20 (×8): 500 mg via ORAL
  Filled 2021-04-18 (×8): qty 1

## 2021-04-18 MED ORDER — AMLODIPINE BESYLATE 5 MG PO TABS
5.0000 mg | ORAL_TABLET | Freq: Every day | ORAL | Status: DC
  Administered 2021-04-18 – 2021-04-20 (×3): 5 mg via ORAL
  Filled 2021-04-18 (×3): qty 1

## 2021-04-18 MED ORDER — LACTULOSE 10 GM/15ML PO SOLN
20.0000 g | ORAL | Status: AC
  Administered 2021-04-18: 20 g via ORAL
  Filled 2021-04-18: qty 30

## 2021-04-18 NOTE — Progress Notes (Addendum)
[facility] Progress Note  Patient: Ian King    PYP:[id number]  DOA: 04/14/2021     Date of Service: the patient was seen and examined on 04/18/2021  Brief hospital course: Past medical history of HTN, HLD, DM, CVA, gout, depression, chronic A. fib, seizure, dementia, COVID-19 May 22.  Presents with complaints of a mechanical fall.  Found to have right-sided rib fracture. Cardiology consulted for incidental atrial thrombus seen on the CT scan. Currently plan is pain control and medically stable.  Arrange for SNF.  Engage with goals of care conversation.  Subjective: Patient somewhat confused.  No nausea no vomiting.  No report of pain.  No diarrhea.  Constipation still present.  Assessment and Plan: 1.  Mechanical fall. Acute right rib fracture CT head as well as C-spine negative for any acute injury. CT chest shows acute right-sided rib fractures. Continue incentive spirometry. Pain treated with scheduled Toradol, Tylenol and as needed Norco. We will transition to naproxen, Tylenol 3 times daily and continue Norco. Secondary to patient's dementia patient remains confused and develops hallucination both auditory and visual as well as has agitation with higher dose narcotics. Currently on Norco for pain control. Patient worked with physical therapy with minimal improvement in his ability to walk.  SNF still recommended.  2.  Left atrial thrombus History of chronic A. fib On chronic anticoagulation Bradycardic events on telemetry CT incidentally shows left atrial appendage thrombus. Cardiology consulted. Patient currently on Eliquis. Further work-up will not change management and I agree with that and therefore continue with Eliquis. Patient may benefit from outpatient ZIO monitor. Started back on Lopressor.  3.  CAD, HLD, HTN Not on any antiplatelet medication.  Monitor.  Currently Lopressor on hold.  Blood pressure stable. Lipitor continue. Adding Norvasc for blood  pressure.  4.  Type 2 diabetes mellitus, well controlled.  With HLD.  No long-term insulin use. Currently on sliding scale insulin.  5.  History of seizure disorder On Keppra.  Continue.  6.  Dementia Hospital induced delirium. On Exelon patch as well as Seroquel.   Exelon patch can likely cause bradycardic events. Intermittently confused at home but continues to have now worsening confusion in the setting of ongoing pain and requiring pain medication For now we will continue. Continue Seroquel 25 mg twice daily as needed along with 50 mg nightly.  7.  Renal lesion Indeterminant lesion on left kidney lower pole. Nonemergent MRI with and without contrast is recommended to exclude neoplasm.  8.  Pulmonary nodule. Noncontrast CT chest can be considered in 12 months.  9.  Chronic diastolic CHF. Mild volume overload. Treated with IV Lasix x1.  Increase the Lasix dose to daily. Flutter valve ordered.  10.  Constipation. Continue bowel regimen.  11.  Goals of care conversation. Patient's confusion and inability to clear secretions secondary to chest pain is concerning for poor prognosis and patient also has other multiple comorbidities as well as advanced dementia which limits options for recovery should he have any significant infection or illness.  Monitor for now.  Patient DNR.  Scheduled Meds:  acetaminophen  500 mg Oral TID   amLODipine  5 mg Oral Daily   apixaban  5 mg Oral BID   atorvastatin  80 mg Oral Q24H   cholecalciferol  1,000 Units Oral Daily   famotidine  20 mg Oral Daily   feeding supplement  237 mL Oral BID BM   furosemide  20 mg Oral Daily   hydrocerin  1  application Topical BID   insulin aspart  0-5 Units Subcutaneous QHS   insulin aspart  0-6 Units Subcutaneous TID WC   levETIRAcetam  1,000 mg Oral BID   lidocaine  1 patch Transdermal Q24H   metoprolol tartrate  12.5 mg Oral BID   multivitamin with minerals  1 tablet Oral Daily   naproxen  500 mg Oral  BID WC   QUEtiapine  50 mg Oral QHS   ramipril  10 mg Oral BID   rivastigmine  1.5 mg Oral BID   senna-docusate  2 tablet Oral BID   Continuous Infusions: PRN Meds: bisacodyl, HYDROcodone-acetaminophen, ondansetron (ZOFRAN) IV, QUEtiapine  Body mass index is 29 kg/m.        DVT Prophylaxis:    apixaban (ELIQUIS) tablet 5 mg    Advance goals of care discussion: Pt is DNR.  Family Communication: family was present at bedside, at the time of interview.  The pt provided permission to discuss medical plan with the family. Opportunity was given to ask question and all questions were answered satisfactorily.   Data Reviewed: I have personally reviewed and interpreted daily labs, tele strips, imaging. Potassium improving.  Blood glucose elevated.  Hemoglobin stable.  Physical Exam:  General: Appear in mild distress, no Rash; Oral Mucosa Clear, moist. no Abnormal Neck Mass Or lumps, Conjunctiva normal  Cardiovascular: S1 and S2 Present, no Murmur, Respiratory: good respiratory effort, Bilateral Air entry present and CTA, no Crackles, no wheezes Abdomen: Bowel Sound present, Soft and no tenderness Extremities: no Pedal edema Neurology: alert and oriented to person affect appropriate. no new focal deficit Gait not checked due to patient safety concerns   Vitals:   04/17/21 1952 04/18/21 0434 04/18/21 0807 04/18/21 1122  BP: (!) 180/85 (!) 182/144 (!) 141/85   Pulse: 63 (!) 105 (!) 54   Resp: 18 18 18    Temp: 98.8 F (37.1 C) 97.9 F (36.6 C) 98.3 F (36.8 C)   TempSrc:      SpO2: 93% (!) 73% 94%   Weight:    97 kg    Disposition:  Status is: Inpatient  Remains inpatient appropriate because:Ongoing active pain requiring inpatient pain management, Altered mental status, and Unsafe d/c plan continues to have severe pain as well as confusion.  Unsafe discharge as lives with wife and requires 2+ assist for minimal ambulation.  Dispo: The patient is from: Home               Anticipated d/c is to: SNF              Patient currently is medically stable to d/c.   Difficult to place patient No   Author: , MD [facility] 04/18/2021 6:05 PM  To reach On-call, see care teams to locate the attending and reach out via [email or web address]. Between 7PM-7AM, please contact night-coverage If you still have difficulty reaching the attending provider, please page the [facility] (Director on Call) for [facility] on amion for assistance.

## 2021-04-19 ENCOUNTER — Inpatient Hospital Stay: Payer: Medicare PPO

## 2021-04-19 DIAGNOSIS — W19XXXA Unspecified fall, initial encounter: Secondary | ICD-10-CM | POA: Diagnosis not present

## 2021-04-19 DIAGNOSIS — I10 Essential (primary) hypertension: Secondary | ICD-10-CM | POA: Diagnosis not present

## 2021-04-19 DIAGNOSIS — Y92009 Unspecified place in unspecified non-institutional (private) residence as the place of occurrence of the external cause: Secondary | ICD-10-CM | POA: Diagnosis not present

## 2021-04-19 LAB — GLUCOSE, CAPILLARY
Glucose-Capillary: 149 mg/dL — ABNORMAL HIGH (ref 70–99)
Glucose-Capillary: 172 mg/dL — ABNORMAL HIGH (ref 70–99)
Glucose-Capillary: 146 mg/dL — ABNORMAL HIGH (ref 70–99)
Glucose-Capillary: 186 mg/dL — ABNORMAL HIGH (ref 70–99)

## 2021-04-19 LAB — RESP PANEL BY RT-PCR (FLU A&B, COVID) ARPGX2
Influenza A by PCR: NEGATIVE
Influenza B by PCR: NEGATIVE
SARS Coronavirus 2 by RT PCR: POSITIVE — AB

## 2021-04-19 MED ORDER — LACTULOSE 10 GM/15ML PO SOLN
30.0000 g | ORAL | Status: DC
  Administered 2021-04-19 – 2021-04-20 (×5): 30 g via ORAL
  Filled 2021-04-19 (×5): qty 60

## 2021-04-19 MED ORDER — BISACODYL 10 MG RE SUPP
10.0000 mg | Freq: Once | RECTAL | Status: DC
  Filled 2021-04-19: qty 1

## 2021-04-19 NOTE — Progress Notes (Signed)
Physical Therapy Treatment Patient Details Name: Ian King MRN: [id number] DOB: 24-Sep-1941 Today's Date: 04/19/2021    History of Present Illness Pt is an 80 y.o. male presenting to hospital 7/12 s/p mechanical fall (pt got up without assistance); c/o R flank pain.  Pt admitted with multiple R sided nondisplaced rib fx's; also displaced fx posterolateral aspect R 7th rib (no pneumothorax noted).  Imaging showing concern for L atrial appendage thrombus (cardiology consulted).  PMH includes dementia, a-fib on Eliquis, DM, htn, CVS, CAD, back trauma s/p spinal surgery, stroke, Raynaud phenomenon, ankylosing spondylitis, seizure disorder, arm sx, hip replacement, knee replacement.    PT Comments    Pt received sitting up in recliner with wife and caregiver present; agreeable to therapy however confused. He demonstrated difficulty following commands. STS attempted twice, was not able to extend hip and trunk to achieve upright on first attempt. He did attain standing on second attempt with MOD A to lift. Attempt at ambulation resulted in standing marches and an unplanned stand>sit. PT provided MAX A to pull hips back to recliner and control descent. Due to standing bout deemed unsafe, seated therex were attempted with continuous attempts to redirect to task at hand. Pt reverted to seated marches with attempts to perform other exercises. Pt would benefit from skilled PT to address above deficits and promote optimal return to PLOF.   Follow Up Recommendations  Supervision/Assistance - 24 hour;SNF     Equipment Recommendations  None recommended by PT    Recommendations for Other Services       Precautions / Restrictions      Mobility  Bed Mobility                    Transfers Overall transfer level: Needs assistance Equipment used: Rolling walker (2 wheeled) Transfers: Sit to/from Stand Sit to Stand: Max assist         General transfer comment: Pt stood from recliner with  MOD A to lift. After attempted steps forward, he sat down without indication requiring MAX A to pull hips back to recliner and desend with control.  Ambulation/Gait                 Stairs             Wheelchair Mobility    Modified Rankin (Stroke Patients Only)       Balance Overall balance assessment: Needs assistance Sitting-balance support: Feet supported;Bilateral upper extremity supported Sitting balance-Leahy Scale: Poor Sitting balance - Comments: has posterior lean in  sitting   Standing balance support: Bilateral upper extremity supported Standing balance-Leahy Scale: Poor Standing balance comment: cognition more so than balance                            Cognition Arousal/Alertness: Awake/alert Behavior During Therapy: WFL for tasks assessed/performed (confused but able to follow commands inconsistently) Overall Cognitive Status: History of cognitive impairments - at baseline                                 General Comments: Pt required less encouragement today for participation however he did need constant redirecting to task. pt has baseline dementia. spouse and hired caregivers take care of pt at home 24/7. [facility] mobility prior to returning home.      Exercises Other Exercises Other Exercises: Standing marches using RW  x 10 reps (attempted ambulation). He traveled forward <57ft. Other Exercises: Seated marches x10 BLE plus countless marches as he performed these rather than other instructed exercises; LAQ x3 BLE, hip adduction x2 reps    General Comments        Pertinent Vitals/Pain Faces Pain Scale: Hurts a little bit Pain Location: R sided ribs Pain Descriptors / Indicators: Guarding Pain Intervention(s): Limited activity within patient's tolerance    Home Living                      Prior Function            PT Goals (current goals can now be found in the care plan section) Acute  Rehab PT Goals Patient Stated Goal: none stated PT Goal Formulation: With patient/family Time For Goal Achievement: 04/28/21 Potential to Achieve Goals: Poor    Frequency    Min 2X/week      PT Plan      Co-evaluation              AM-PAC PT "6 Clicks" Mobility   Outcome Measure  Help needed turning from your back to your side while in a flat bed without using bedrails?: A Little Help needed moving from lying on your back to sitting on the side of a flat bed without using bedrails?: A Lot Help needed moving to and from a bed to a chair (including a wheelchair)?: A Lot Help needed standing up from a chair using your arms (e.g., wheelchair or bedside chair)?: A Lot Help needed to walk in hospital room?: A Lot Help needed climbing 3-5 steps with a railing? : A Lot 6 Click Score: 13    End of Session Equipment Utilized During Treatment: Gait belt Activity Tolerance: Patient tolerated treatment well Patient left: in chair;with call bell/phone within reach;with chair alarm set;with family/visitor present   PT Visit Diagnosis: Other abnormalities of gait and mobility (R26.89);Muscle weakness (generalized) (M62.81);History of falling (Z91.81)     Time: 1443-1540 PT Time Calculation (min) (ACUTE ONLY): 21 min  Charges:  $Therapeutic Exercise: 8-22 mins                    Basilia Jumbo PT, DPT 04/19/21 12:22 PM [phone]    Lavenia Atlas 04/19/2021, 12:22 PM

## 2021-04-19 NOTE — Progress Notes (Signed)
OT Cancellation Note  Patient Details Name: Ian King MRN: [id number] DOB: [birth year]   Cancelled Treatment:    Reason Eval/Treat Not Completed: Medical issues which prohibited therapy. Chart reviewed. Pt noted with elevated BP, 160's / 90's this morning. Will hold OT tx at this time and re-attempt at later date/time as medically appropriate.   Wynona Canes, MPH, MS, OTR/L ascom [phone] 04/19/21, 11:54 AM

## 2021-04-19 NOTE — Progress Notes (Signed)
[facility] Progress Note  Patient: Ian King    WGN:[id number]  DOA: 04/14/2021     Date of Service: the patient was seen and examined on 04/19/2021  Brief hospital course: Past medical history of HTN, HLD, DM, CVA, gout, depression, chronic A. fib, seizure, dementia, COVID-19 May 22.  Presents with complaints of a mechanical fall.  Found to have right-sided rib fracture. Cardiology consulted for incidental atrial thrombus seen on the CT scan. Currently plan is arrange for SNF.  Subjective: Remains confused.  No nausea no vomiting hematemesis.  Denies actually any pain.  Unable to ambulate with PT today.  Assessment and Plan: 1.  Mechanical fall. Acute right rib fracture CT head as well as C-spine negative for any acute injury. CT chest shows acute right-sided rib fractures. Continue incentive spirometry. Pain treated with scheduled Toradol, Tylenol and as needed Norco. We will transition to naproxen, Tylenol 3 times daily and continue Norco. Secondary to patient's dementia patient remains confused and develops hallucination both auditory and visual as well as has agitation with higher dose narcotics. Currently on Norco for pain control. Patient worked with physical therapy with minimal improvement in his ability to walk.  SNF still recommended.  2.  Left atrial thrombus History of chronic A. fib On chronic anticoagulation Bradycardic events on telemetry CT incidentally shows left atrial appendage thrombus. Cardiology consulted. Patient currently on Eliquis. Further work-up will not change management and I agree with that and therefore continue with Eliquis. Patient may benefit from outpatient ZIO monitor. Started back on Lopressor.  3.  CAD, HLD, HTN Not on any antiplatelet medication.  Monitor.  Currently Lopressor on hold.  Blood pressure stable. Lipitor continue. Adding Norvasc for blood pressure.  4.  Type 2 diabetes mellitus, well controlled.  With HLD.  No  long-term insulin use. Currently on sliding scale insulin.  5.  History of seizure disorder On Keppra.  Continue.  6.  Dementia Hospital induced delirium. On Exelon patch as well as Seroquel.   Exelon patch can likely cause bradycardic events. Intermittently confused at home but continues to have now worsening confusion in the setting of ongoing pain and requiring pain medication For now we will continue. Continue Seroquel 25 mg twice daily as needed along with 50 mg nightly.  7.  Renal lesion Indeterminant lesion on left kidney lower pole. Nonemergent MRI with and without contrast is recommended to exclude neoplasm.  8.  Pulmonary nodule. Noncontrast CT chest can be considered in 12 months.  9.  Chronic diastolic CHF. Mild volume overload. Treated with IV Lasix x1.  Increase the Lasix dose to daily. Flutter valve ordered.  10.  Constipation. Continue bowel regimen.  11.  Goals of care conversation. Patient's confusion and inability to clear secretions secondary to chest pain is concerning for poor prognosis and patient also has other multiple comorbidities as well as advanced dementia which limits options for recovery should he have any significant infection or illness.  Monitor for now.  Patient DNR.  12.  COVID-19 infection. Tested positive for COVID-19 on 02/19/2021. Treated with moxifloxacin, prednisone, Tessalon Perles. Did not require any hospitalization or other treatment. Not appropriate to retest to the hospital on admission. At the time of SNF transfer COVID-19 test positive again.  CT value more than 37. I suspect that this patient does not require further isolation.  We will remove isolation if no new symptoms in next 24 hours. Will request SNF to consider admission.  Scheduled Meds:  acetaminophen  500  mg Oral TID   amLODipine  5 mg Oral Daily   apixaban  5 mg Oral BID   atorvastatin  80 mg Oral Q24H   bisacodyl  10 mg Rectal Once   cholecalciferol  1,000  Units Oral Daily   famotidine  20 mg Oral Daily   feeding supplement  237 mL Oral BID BM   furosemide  20 mg Oral Daily   hydrocerin  1 application Topical BID   insulin aspart  0-5 Units Subcutaneous QHS   insulin aspart  0-6 Units Subcutaneous TID WC   lactulose  30 g Oral Q4H   levETIRAcetam  1,000 mg Oral BID   lidocaine  1 patch Transdermal Q24H   metoprolol tartrate  12.5 mg Oral BID   multivitamin with minerals  1 tablet Oral Daily   naproxen  500 mg Oral BID WC   QUEtiapine  50 mg Oral QHS   ramipril  10 mg Oral BID   rivastigmine  1.5 mg Oral BID   senna-docusate  2 tablet Oral BID   Continuous Infusions: PRN Meds: bisacodyl, HYDROcodone-acetaminophen, ondansetron (ZOFRAN) IV, QUEtiapine  Body mass index is 29 kg/m.        DVT Prophylaxis:    apixaban (ELIQUIS) tablet 5 mg    Advance goals of care discussion: Pt is DNR.  Family Communication: family was present at bedside, at the time of interview.  The pt provided permission to discuss medical plan with the family. Opportunity was given to ask question and all questions were answered satisfactorily.   Data Reviewed: I have personally reviewed and interpreted daily labs, tele strips, imaging. X-ray shows stool burden.  Chest x-ray unremarkable.  COVID-19 test positive CT value 37.1.  CBC mildly elevated.  Physical Exam:  General: Appear in mild distress, no Rash; Oral Mucosa Clear, moist. no Abnormal Neck Mass Or lumps, Conjunctiva normal  Cardiovascular: S1 and S2 Present, no Murmur, Respiratory: good respiratory effort, Bilateral Air entry present and CTA, no Crackles, no wheezes Abdomen: Bowel Sound present, Soft and no tenderness Extremities: no Pedal edema Neurology: alert and not oriented to time, place, and person affect appropriate. no new focal deficit Gait not checked due to patient safety concerns   Vitals:   04/18/21 2059 04/19/21 0459 04/19/21 0746 04/19/21 1532  BP: (!) 163/98 (!) 165/93 (!)  160/96 (!) 165/66  Pulse: 76 (!) 59 72 (!) 55  Resp: 18 18 16 16   Temp: 98 F (36.7 C) 97.6 F (36.4 C) 98.1 F (36.7 C) 98.1 F (36.7 C)  TempSrc: Oral Oral  Oral  SpO2: 92% 91% 94% 96%  Weight:        Disposition:  Status is: Inpatient  Remains inpatient appropriate because:Ongoing active pain requiring inpatient pain management, Altered mental status, and Unsafe d/c plan continues to have severe pain as well as confusion.  Unsafe discharge as lives with wife and requires 2+ assist for minimal ambulation.  Dispo: The patient is from: Home              Anticipated d/c is to: SNF              Patient currently is medically stable to d/c.   Difficult to place patient No   Author: , MD [facility] 04/19/2021 8:00 PM  To reach On-call, see care teams to locate the attending and reach out via [email or web address].04/21/2021. Between 7PM-7AM, please contact night-coverage If you still have difficulty reaching the attending provider, please page the [facility] (  Director on Call) for [facility] on [place] for assistance.

## 2021-04-20 DIAGNOSIS — I10 Essential (primary) hypertension: Secondary | ICD-10-CM | POA: Diagnosis not present

## 2021-04-20 DIAGNOSIS — W19XXXA Unspecified fall, initial encounter: Secondary | ICD-10-CM | POA: Diagnosis not present

## 2021-04-20 DIAGNOSIS — Y92009 Unspecified place in unspecified non-institutional (private) residence as the place of occurrence of the external cause: Secondary | ICD-10-CM | POA: Diagnosis not present

## 2021-04-20 LAB — GLUCOSE, CAPILLARY
Glucose-Capillary: 208 mg/dL — ABNORMAL HIGH (ref 70–99)
Glucose-Capillary: 186 mg/dL — ABNORMAL HIGH (ref 70–99)
Glucose-Capillary: 147 mg/dL — ABNORMAL HIGH (ref 70–99)

## 2021-04-20 MED ORDER — FAMOTIDINE 20 MG PO TABS: 20.0000 mg | ORAL_TABLET | Freq: Every day | ORAL | 0 refills | Status: DC

## 2021-04-20 MED ORDER — LIDOCAINE 5 % EX PTCH: 1.0000 | MEDICATED_PATCH | CUTANEOUS | 0 refills | Status: DC

## 2021-04-20 MED ORDER — HYDROCODONE-ACETAMINOPHEN 5-325 MG PO TABS: 1.0000 | ORAL_TABLET | Freq: Four times a day (QID) | ORAL | 0 refills | Status: DC | PRN

## 2021-04-20 MED ORDER — METOPROLOL TARTRATE 25 MG PO TABS: 12.5000 mg | ORAL_TABLET | Freq: Two times a day (BID) | ORAL | 0 refills | Status: DC

## 2021-04-20 MED ORDER — AMLODIPINE BESYLATE 5 MG PO TABS: 5.0000 mg | ORAL_TABLET | Freq: Every day | ORAL | 0 refills | Status: DC

## 2021-04-20 MED ORDER — NAPROXEN 500 MG PO TABS: 500.0000 mg | ORAL_TABLET | Freq: Two times a day (BID) | ORAL | 0 refills | Status: AC

## 2021-04-20 MED ORDER — FUROSEMIDE 20 MG PO TABS: 20.0000 mg | ORAL_TABLET | Freq: Every day | ORAL | 0 refills | Status: DC

## 2021-04-20 NOTE — Plan of Care (Signed)
Patient discharged per MD orders at this time.All discharge instructions,medications and education reviewed with patient and wife at bedside.Pt expressed understanding and will comply with dc instructions.follow up appointments also communicated to patient/wife.no verbal c/o or any ssx of distress.Pt discharged to [facility] for PT/OT services per order. Report was called to Ms.Kathy/Admissions before transport.Pt was transported by 2 EMS personnel on a stretcher.

## 2021-04-20 NOTE — TOC Progression Note (Signed)
Transition of Care [facility]) - Progression Note    Patient Details  Name: MATEO OVERBECK MRN: [id number] Date of Birth: April 25, 1941  Transition [facility]) CM/SW Contact  Barrie Dunker, RN Phone Number: 04/20/2021, 2:43 PM  Clinical Narrative:     Spoke with [facility] at [facility], [facility] agreed [facility] the patient with the Covid Positive test being as he had covid in May and has no symptoms, he will be in quarantine for 14 days of there that long Notified the physician Expected Discharge Plan: Home w Home Health Services Barriers to Discharge: Barriers Resolved  Expected Discharge Plan and Services Expected Discharge Plan: Home w Home Health Services   Discharge Planning Services: CM Consult   Living arrangements for the past 2 months: Single Family Home                 DME Arranged: N/A         HH Arranged: PT, OT HH Agency: [facility] (now [facility]) Date HH Agency Contacted: 04/15/21 Time HH Agency Contacted: [phone] Representative spoke with at [facility] Agency: STacie   Social Determinants of Health (SDOH) Interventions    Readmission Risk Interventions No flowsheet data found.

## 2021-04-20 NOTE — Progress Notes (Signed)
OT Cancellation Note  Patient Details Name: Ian King MRN: [id number] DOB: [birth year]   Cancelled Treatment:    Reason Eval/Treat Not Completed: Other (comment). Pt working with PT upon attempt. Will re-attempt OT tx at later date/time.   Wynona Canes, MPH, MS, OTR/L ascom [phone] 04/20/21, 2:05 PM

## 2021-04-20 NOTE — TOC Progression Note (Signed)
Transition of Care [facility]) - Progression Note    Patient Details  Name: Ian King MRN: [id number] Date of Birth: [birth year]  Transition of Care [facility]) CM/SW Contact  Barrie Dunker, RN Phone Number: 04/20/2021, 3:46 PM  Clinical Narrative:     Patient to transport via [place] [facility] to [facility] resources room 707 today at 5 PM, Family and patient aware  Expected Discharge Plan: Home w Home Health Services Barriers to Discharge: Barriers Resolved  Expected Discharge Plan and Services Expected Discharge Plan: Home w Home Health Services   Discharge Planning Services: CM Consult   Living arrangements for the past 2 months: Single Family Home Expected Discharge Date: 04/20/21               DME Arranged: N/A         HH Arranged: PT, OT HH Agency: [facility] ([facility]) Date HH Agency Contacted: 04/15/21 Time HH Agency Contacted: [phone] Representative spoke with at [facility] Agency: STacie   Social Determinants of Health (SDOH) Interventions    Readmission Risk Interventions No flowsheet data found.

## 2021-04-20 NOTE — TOC Progression Note (Signed)
Transition of Care [facility]) - Progression Note    Patient Details  Name: MARKEL KURTENBACH MRN: [id number] Date of Birth: 07-11-1941  [facility]) CM/SW Contact  Barrie Dunker, RN Phone Number: 04/20/2021, 8:37 AM  Clinical Narrative:    Due to a positive covid test the patient is not able to go to [facility] as planned for STR, will need to be 10 days past the positive covid test.  He will continue to work with PT to strengthen and possibly DC home prior top the 10 days, TOC CM will continue to monitor for needs   Expected Discharge Plan: Home w Home Health Services Barriers to Discharge: Barriers Resolved  Expected Discharge Plan and Services Expected Discharge Plan: Home w Home Health Services   Discharge Planning Services: CM Consult   Living arrangements for the past 2 months: Single Family Home                 DME Arranged: N/A         HH Arranged: PT, OT HH Agency: [facility] ([facility]) Date HH Agency Contacted: 04/15/21 Time HH Agency Contacted: [phone] Representative spoke with at [facility] Agency: STacie   Social Determinants of Health (SDOH) Interventions    Readmission Risk Interventions No flowsheet data found.

## 2021-04-20 NOTE — Care Management Important Message (Signed)
Important Message  Patient Details  Name: Ian King MRN: [id number] Date of Birth: [birth year]   Medicare Important Message Given:  Yes  Patient tested positive for Covid so per hospital guidelines I was not able to go into the room. I called his room [phone] to talk with him but his wife said it would be best to talk with her.  I reviewed the Important Message from Medicare with her and she is agreement with the discharge plan.  I asked if she would like a copy and she replied yes and I e-mailed a copy to her at [email or web address] as directed.  I thanked her for her time.   [name] 04/20/2021, 3:57 PM

## 2021-04-20 NOTE — Discharge Summary (Addendum)
[facility] Discharge Summary   Patient: ALDRED MASE VEH:[id number]  PCP: Marguarite Arbour, MD  Date of admission: 04/14/2021   Date of discharge:  04/20/2021     Discharge Diagnoses:  Principal Problem:   Fall at home, initial encounter Active Problems:   CAD (coronary artery disease)   Chronic atrial fibrillation (HCC)   Diabetes mellitus without complication (HCC)   HTN (hypertension)   Hyperlipidemia   Seizure disorder (HCC)   Stroke (HCC)   Dementia (HCC)   Right rib fracture   Renal lesion   Trauma  Admitted From: home Disposition:  SNF   Recommendations for Outpatient Follow-up:  PCP: follow up with PCP in 1 week Need a referral for Palliative care team at SNF   Follow-up Information     Marguarite Arbour, MD. Schedule an appointment as soon as possible for a visit in 2 days.   Specialty: Internal Medicine Contact information: [address] [facility] [place] [address] [phone]                Discharge Instructions     Diet - low sodium heart healthy   Complete by: As directed    Discharge instructions   Complete by: As directed    It is important that you read the instructions as well as go over your medication list with RN to help you understand your care after this hospitalization.  Please follow-up with PCP in 1-2 weeks.  Please note that we are unable to authorize any refills for discharge medications, once you are discharged. Thus, it is imperative that you return to your primary care physician (or establish a relationship with a primary care physician if you do not have one) for your care needs. So that they can reassess your need for medications and monitor your lab values.  Please request your primary care physician to go over all Hospital Tests and Procedure/Radiological results at the follow up. Please get all Hospital records sent to your PCP by signing hospital release before you go home.    Do not take  more than prescribed Pain, Sleep and Anxiety Medications.  You were cared for by a hospitalist during your hospital stay. If you have any questions about your discharge medications or the care you received while you were in the hospital after you are discharged, you can call the hospital unit/floor you were admitted to and ask to speak with the hospitalist who took care of you. Ask for Hospitalist on call, if the hospitalist that took care of you is not available. Once you are discharged, your primary care physician will help you with any further medical issues. You Must read complete instructions/literature along with all the possible adverse reactions/side effects for all the Medicines you take and that have been prescribed to you. Take any new Medicines after you have completely understood and accept all the possible adverse reactions/side effects   Increase activity slowly   Complete by: As directed        Diet recommendation: Cardiac diet  Activity: The patient is advised to gradually reintroduce usual activities, as tolerated  Discharge Condition: stable  Code Status: DNR   History of present illness: As per the H and P dictated on admission, "KOSTAS MARROW is a 80 y.o. male with medical history significant of HTN, HLD, DM, stroke, gout, depression, A fib on Eliquis, Vb12 deficiency, chronic venous insufficiency change, seizure, dementia, COVID-19 infection 02/19/2021,n who presents with fall and right  flank pain.    Per his daughter at bedside, pt fell accidentally when he got up, trying to turn on the air conditioning at about 10:00 PM last night. No LOC. Pt complains of sharp pain in right flank and right rib cage, some back and right hip pain. Patient reports that he did not hit his head.  Patient does not have cough, shortness of breath, nausea, vomiting, diarrhea or abdominal pain.  No symptoms of UTI.  Patient moves all extremities.   ED Course: pt was found to have WBC 5.8,  temperature normal, blood pressure 211/84, 173/85, heart rate 86, RR 16, oxygen saturation 93-98% on room air.  CT of head and CT of C-spine is negative for acute injury. Pt is placed on MedSurg bed for observation, Dr. Lady Gary of cardiology is consulted."  Hospital Course:  Summary of his active problems in the hospital is as following. 1.  Mechanical fall. Acute right rib fracture CT head as well as C-spine negative for any acute injury. CT chest shows acute right-sided rib fractures. Continue incentive spirometry. Pain treated with scheduled Toradol, Tylenol and as needed Norco. We will transition to naproxen, Tylenol 3 times daily and continue Norco. Secondary to patient's dementia patient remains confused and develops hallucination both auditory and visual as well as has agitation with higher dose narcotics. Currently on Norco for pain control. Patient worked with physical therapy with minimal improvement in his ability to walk.  SNF still recommended.   2.  Left atrial thrombus History of chronic A. fib On chronic anticoagulation Bradycardic events on telemetry CT incidentally shows left atrial appendage thrombus. Cardiology consulted. Patient currently on Eliquis. Further work-up will not change management and I agree with that and therefore continue with Eliquis. Patient may benefit from outpatient ZIO monitor. Started back on Lopressor.   3.  CAD, HLD, HTN Not on any antiplatelet medication.  Monitor.  Currently Lopressor on hold.  Blood pressure stable. Lipitor continue. Adding Norvasc for blood pressure.   4.  Type 2 diabetes mellitus, well controlled.  With HLD.  No long-term insulin use. Currently on sliding scale insulin.   5.  History of seizure disorder On Keppra.  Continue.   6.  Dementia Hospital induced delirium. On Exelon patch as well as Seroquel.   Exelon patch can likely cause bradycardic events. Intermittently confused at home but continues to have now  worsening confusion in the setting of ongoing pain and requiring pain medication For now we will continue. Continue Seroquel 25 mg twice daily as needed along with 50 mg nightly.   7.  Renal lesion Indeterminant lesion on left kidney lower pole. Nonemergent MRI with and without contrast is recommended to exclude neoplasm.   8.  Pulmonary nodule. Noncontrast CT chest can be considered in 12 months.   9.  Chronic diastolic CHF. Mild volume overload. Treated with IV Lasix x1.  Increase the Lasix dose to daily. Flutter valve ordered.   10.  Constipation. Continue bowel regimen.   11.  Goals of care conversation. Patient's confusion and inability to clear secretions secondary to chest pain is concerning for poor prognosis and patient also has other multiple comorbidities as well as advanced dementia which limits options for recovery should he have any significant infection or illness.  Monitor for now.  Patient DNR.   12.  COVID-19 infection. Tested positive for COVID-19 on 02/19/2021. Treated with moxifloxacin, prednisone, Tessalon Perles. Did not require any hospitalization or other treatment. Not appropriate to retest to the  hospital on admission. At the time of SNF transfer COVID-19 test positive again.  CT value more than 37. I suspect that this patient does not require further isolation.  We will remove isolation   13. Pain control  - [employer] Controlled Substance Reporting System database was reviewed. - 5 day supply was provided. - Patient was instructed, not to drive, operate heavy machinery, perform activities at heights, swimming or participation in water activities or provide baby sitting services while on Pain, Sleep and Anxiety Medications; until his outpatient Physician has advised to do so again.  - Also recommended to not to take more than prescribed Pain, Sleep and Anxiety Medications.  Patient was seen by physical therapy, who recommended SNF. On the day of the  discharge the patient's vitals were stable, and no other new acute medical condition were reported. The patient was felt safe to be discharge at SNF with Therapy.  Consultants: Cardiology  Procedures: none   DISCHARGE MEDICATION: Allergies as of 04/20/2021       Reactions   Haloperidol Other (See Comments)   Catatonic    Doxycycline Swelling   Lovastatin Swelling, Other (See Comments)   Oral swelling, not requiring ER, no problem with Lipitor        Medication List     TAKE these medications    acetaminophen 325 MG tablet Commonly known as: TYLENOL Take 2 tablets (650 mg total) by mouth every 6 (six) hours as needed for mild pain, fever or headache.   amLODipine 5 MG tablet Commonly known as: NORVASC Take 1 tablet (5 mg total) by mouth daily. Start taking on: April 21, 2021   apixaban 5 MG Tabs tablet Commonly known as: ELIQUIS Take 1 tablet (5 mg total) by mouth 2 (two) times daily.   atorvastatin 80 MG tablet Commonly known as: LIPITOR Take 80 mg by mouth daily.   bisacodyl 5 MG EC tablet Commonly known as: DULCOLAX Take 1 tablet (5 mg total) by mouth daily as needed for moderate constipation.   bisacodyl 10 MG suppository Commonly known as: DULCOLAX Place 1 suppository (10 mg total) rectally daily as needed for severe constipation.   cholecalciferol 25 MCG (1000 UNIT) tablet Commonly known as: VITAMIN D3 Take 1,000 Units by mouth daily.   famotidine 20 MG tablet Commonly known as: PEPCID Take 1 tablet (20 mg total) by mouth daily. Start taking on: April 21, 2021   feeding supplement Liqd Take 237 mLs by mouth 2 (two) times daily between meals.   Fish Oil 1000 MG Caps Take 1,000 mg by mouth in the morning and at bedtime.   furosemide 20 MG tablet Commonly known as: LASIX Take 1 tablet (20 mg total) by mouth daily. What changed: when to take this   hydrocerin Crea Apply 1 application topically 2 (two) times daily.   HYDROcodone-acetaminophen 5-325  MG tablet Commonly known as: NORCO/VICODIN Take 1 tablet by mouth every 6 (six) hours as needed for severe pain.   ketoconazole 2 % shampoo Commonly known as: NIZORAL Apply 1 application topically 2 (two) times a week.   levETIRAcetam 1000 MG tablet Commonly known as: KEPPRA 1,000 mg 2 (two) times daily.   lidocaine 5 % Commonly known as: LIDODERM Place 1 patch onto the skin daily. Remove & Discard patch within 12 hours or as directed by MD Start taking on: April 21, 2021   metFORMIN 1000 MG tablet Commonly known as: GLUCOPHAGE Take 1,000 mg by mouth 2 (two) times daily.   metoprolol  tartrate 25 MG tablet Commonly known as: LOPRESSOR Take 0.5 tablets (12.5 mg total) by mouth 2 (two) times daily. What changed: how much to take   multivitamin with minerals Tabs tablet Take 1 tablet by mouth daily.   naproxen 500 MG tablet Commonly known as: NAPROSYN Take 1 tablet (500 mg total) by mouth 2 (two) times daily with a meal for 7 days.   polyethylene glycol 17 g packet Commonly known as: MIRALAX / GLYCOLAX Take 17 g by mouth daily.   QUEtiapine 50 MG tablet Commonly known as: SEROQUEL Take 1 tablet (50 mg total) by mouth at bedtime.   QUEtiapine 25 MG tablet Commonly known as: SEROQUEL Take 1 tablet (25 mg total) by mouth 2 (two) times daily as needed (agitation).   ramipril 10 MG capsule Commonly known as: ALTACE Take 10 mg by mouth 2 (two) times daily.   rivastigmine 1.5 MG capsule Commonly known as: EXELON Take 1 capsule (1.5 mg total) by mouth 2 (two) times daily.   senna-docusate 8.6-50 MG tablet Commonly known as: Senokot-S Take 1 tablet by mouth 2 (two) times daily.   triamcinolone cream 0.1 % Commonly known as: KENALOG Apply topically 2 (two) times daily.               Durable Medical Equipment  (From admission, onward)           Start     Ordered   04/15/21 1151  For home use only DME 3 n 1  Once        04/15/21 1150   04/15/21 1151  For  home use only DME Walker rolling  Once       Question Answer Comment  Walker: With 5 Inch Wheels   Patient needs a walker to treat with the following condition Physical deconditioning   Patient needs a walker to treat with the following condition Fall      04/15/21 1150            Discharge Exam: Filed Weights   04/18/21 1122  Weight: 97 kg   Vitals:   04/20/21 0829 04/20/21 1141  BP: (!) 157/96 (!) 179/90  Pulse: (!) 56 65  Resp: 18 18  Temp: 98.7 F (37.1 C) 97.9 F (36.6 C)  SpO2: 96% 98%   General: Appear in no distress, no Rash; Oral Mucosa Clear, moist. no Abnormal Neck Mass Or lumps, Conjunctiva normal  Cardiovascular: S1 and S2 Present, no Murmur Respiratory: good respiratory effort, Bilateral Air entry present and CTA, no Crackles, no wheezes Abdomen: Bowel Sound present, Soft and no tenderness Extremities: no Pedal edema Neurology: alert and oriented to person affect appropriate. no new focal deficit  The results of significant diagnostics from this hospitalization (including imaging, microbiology, ancillary and laboratory) are listed below for reference.    Significant Diagnostic Studies: CT HEAD WO CONTRAST  Result Date: 04/14/2021 CLINICAL DATA:  Fall EXAM: CT HEAD WITHOUT CONTRAST CT CERVICAL SPINE WITHOUT CONTRAST TECHNIQUE: Multidetector CT imaging of the head and cervical spine was performed following the standard protocol without intravenous contrast. Multiplanar CT image reconstructions of the cervical spine were also generated. COMPARISON:  None. FINDINGS: CT HEAD FINDINGS Brain: There is no mass, hemorrhage or extra-axial collection. There is generalized atrophy without lobar predilection. There is hypoattenuation of the periventricular white matter, most commonly indicating chronic ischemic microangiopathy. Old small vessel infarct of the right caudate head. Vascular: No abnormal hyperdensity of the major intracranial arteries or dural venous sinuses.  No intracranial  atherosclerosis. Skull: The visualized skull base, calvarium and extracranial soft tissues are normal. Sinuses/Orbits: No fluid levels or advanced mucosal thickening of the visualized paranasal sinuses. No mastoid or middle ear effusion. The orbits are normal. CT CERVICAL SPINE FINDINGS Alignment: No static subluxation. Facets are aligned. Occipital condyles are normally positioned. Skull base and vertebrae: Ankylosis of the entire length of the cervical spine. No acute fracture. Soft tissues and spinal canal: No prevertebral fluid or swelling. No visible canal hematoma. Disc levels: No advanced spinal canal or neural foraminal stenosis. Upper chest: No pneumothorax, pulmonary nodule or pleural effusion. Other: Normal visualized paraspinal cervical soft tissues. IMPRESSION: 1. No acute abnormality the head or cervical spine. 2. Diffuse cervical ankylosis. 3. 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 Electronically Signed   By: Deatra RobinsonKevin  Herman M.D.   On: 04/14/2021 01:37   CT CERVICAL SPINE WO CONTRAST  Result Date: 04/14/2021 CLINICAL DATA:  Fall EXAM: CT HEAD WITHOUT CONTRAST CT CERVICAL SPINE WITHOUT CONTRAST TECHNIQUE: Multidetector CT imaging of the head and cervical spine was performed following the standard protocol without intravenous contrast. Multiplanar CT image reconstructions of the cervical spine were also generated. COMPARISON:  None. FINDINGS: CT HEAD FINDINGS Brain: There is no mass, hemorrhage or extra-axial collection. There is generalized atrophy without lobar predilection. There is hypoattenuation of the periventricular white matter, most commonly indicating chronic ischemic microangiopathy. Old small vessel infarct of the right caudate head. Vascular: No abnormal hyperdensity of the major intracranial arteries or dural venous sinuses. No intracranial atherosclerosis. Skull: The visualized skull base, calvarium and extracranial soft tissues are normal.  Sinuses/Orbits: No fluid levels or advanced mucosal thickening of the visualized paranasal sinuses. No mastoid or middle ear effusion. The orbits are normal. CT CERVICAL SPINE FINDINGS Alignment: No static subluxation. Facets are aligned. Occipital condyles are normally positioned. Skull base and vertebrae: Ankylosis of the entire length of the cervical spine. No acute fracture. Soft tissues and spinal canal: No prevertebral fluid or swelling. No visible canal hematoma. Disc levels: No advanced spinal canal or neural foraminal stenosis. Upper chest: No pneumothorax, pulmonary nodule or pleural effusion. Other: Normal visualized paraspinal cervical soft tissues. IMPRESSION: 1. No acute abnormality the head or cervical spine. 2. Diffuse cervical ankylosis. 3. 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 Electronically Signed   By: Deatra RobinsonKevin  Herman M.D.   On: 04/14/2021 01:37   CT CHEST ABDOMEN PELVIS W CONTRAST  Result Date: 04/14/2021 CLINICAL DATA:  80 year old male with history of trauma from a fall. Back pain. EXAM: CT CHEST, ABDOMEN, AND PELVIS WITH CONTRAST CT OF THE THORACIC SPINE WITHOUT CONTRAST CT OF THE LUMBAR SPINE WITHOUT CONTRAST TECHNIQUE: Multidetector CT imaging of the chest, abdomen and pelvis was performed following the standard protocol during bolus administration of intravenous contrast. Dedicated multiplanar reconstructions of the thoracic and lumbar spine were also generated for evaluation. CONTRAST:  75mL OMNIPAQUE IOHEXOL 300 MG/ML  SOLN COMPARISON:  Thoracic spine CT 12/19/2016. FINDINGS: CT CHEST FINDINGS Cardiovascular: No abnormal high attenuation fluid within the mediastinum to suggest posttraumatic mediastinal hematoma. No evidence of posttraumatic aortic dissection/transection. Heart size is enlarged with biatrial dilatation. Notably, there is a filling defect in the tip of the left atrial appendage (axial image 29 of series 2), concerning for thrombus. There is  no significant pericardial fluid, thickening or pericardial calcification. There is aortic atherosclerosis, as well as atherosclerosis of the great vessels of the mediastinum and the coronary arteries, including calcified atherosclerotic plaque in the left main,  left anterior descending, left circumflex and right coronary arteries. Status post median sternotomy for CABG including LIMA to the LAD. Thickening calcification of the aortic valve. Calcifications of the mitral annulus. Mediastinum/Nodes: No pathologically enlarged mediastinal or hilar lymph nodes. Esophagus is unremarkable in appearance. No axillary lymphadenopathy. Lungs/Pleura: No pneumothorax. No acute consolidative airspace disease. Trace amount of pleural fluid and/or thickening in the posterolateral aspect of the right hemithorax adjacent to rib fractures (discussed below). No frank hemothorax. No left pleural fluid collection. A few scattered 1-2 mm pulmonary nodules are noted throughout the periphery of the lungs bilaterally, nonspecific. Musculoskeletal: Displaced acute fracture of the posterolateral aspect of the right seventh rib. Nondisplaced acute fracture of the posterior aspect of the right eighth rib. Old healed fracture of the posterolateral right eighth rib. Probable nondisplaced fracture of the lateral aspect of the right eighth rib. Acute nondisplaced fracture of the posterior right ninth rib. Old healed fracture of the posterolateral right ninth rib. Possible nondisplaced fracture of the posterior right tenth rib. Rod and screw fixation hardware in place from T6-T11, with no evidence of hardware fracture or loosening. No acute displaced fractures of the thoracic spine. Mild compression of T2 vertebral body with 10% loss of anterior vertebral body height which appears to be chronic. CT ABDOMEN PELVIS FINDINGS Hepatobiliary: No evidence of acute traumatic injury to the liver. No suspicious cystic or solid hepatic lesions. No intra or  extrahepatic biliary ductal dilatation. Gallbladder is normal in appearance. Pancreas: No evidence of acute traumatic injury to the pancreas. No pancreatic mass. No pancreatic ductal dilatation. No pancreatic or peripancreatic fluid collections or inflammatory changes. Spleen: No evidence of acute traumatic injury to the spleen. Unremarkable. Adrenals/Urinary Tract: No evidence of acute traumatic injury to either kidney or adrenal gland. Multifocal cortical thinning in the kidneys bilaterally, likely sequela of prior infection or infarcts. In the anterolateral aspect of the lower pole of the left kidney (axial image 72 of series 2). There is an exophytic 4.0 x 2.7 cm intermediate attenuation (40 Hounsfield unit) lesion which is incompletely characterized on today's examination. Calcifications in both renal hila appear to be vascular. Bilateral adrenal glands are normal in appearance. No hydroureteronephrosis. Urinary bladder is partially obscured by extensive beam hardening artifact from the patient's left hip arthroplasty. With this limitation in mind, the urinary bladder appears intact and normal in appearance. Stomach/Bowel: No definitive evidence of significant acute traumatic injury to the hollow viscera. The appearance of the stomach is normal. No pathologic dilatation of small bowel or colon. 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 Normal appendix. Vascular/Lymphatic: No evidence of significant acute traumatic injury to the abdominal aorta or major arteries/veins of the abdomen or pelvis. Aortic atherosclerosis, without evidence of aneurysm or dissection in the abdominal or pelvic vasculature. No lymphadenopathy noted in the abdomen or pelvis. Reproductive: Prostate gland and seminal vesicles are largely obscured by beam hardening artifact from the patient's left hip arthroplasty. Other: No high attenuation fluid  collection noted within the peritoneal cavity or retroperitoneum to suggest significant posttraumatic hemorrhage. No significant volume of ascites. No pneumoperitoneum. Musculoskeletal: No acute displaced fractures or aggressive appearing lytic or blastic lesions are noted in the visualized portions of the skeleton. Dedicated imaging of the lumbar spine demonstrates a chronic appearing compression fracture of L1 with 25% loss of anterior vertebral body height. Status post left hip arthroplasty. IMPRESSION: 1. Multiple acute right-sided rib fractures. Most of these are nondisplaced as  detailed above, however, there is a displaced fracture of the posterolateral aspect of the right seventh rib. This is associated with a trace volume of right-sided pleural fluid (not a hemothorax at this time) or thickening, but no pneumothorax noted. 2. No other evidence of significant acute traumatic injury to the abdomen or pelvis. 3. Cardiomegaly with biatrial dilatation. Notably, there is a small filling defect in the tip of the left atrial appendage concerning for left atrial appendage thrombus. If present, this would place the patient at risk for systemic embolization. Further evaluation with nonemergent transesophageal echocardiography is recommended in the near future if clinically appropriate. 4. Indeterminate lesion in the lateral aspect of the lower pole of the left kidney. This may simply represent a proteinaceous/hemorrhagic cysts, however, further evaluation with nonemergent abdominal MRI with and without IV gadolinium is recommended in the near future to exclude neoplasm. 5. Aortic atherosclerosis, in addition to left main and 3 vessel coronary artery disease. Status post median sternotomy for CABG including LIMA to the LAD. 6. There are calcifications of the aortic valve and mitral annulus. Echocardiographic correlation for evaluation of potential valvular dysfunction may be warranted if clinically indicated. Multiple  tiny 1-2 mm pulmonary nodules scattered throughout the periphery of the lungs bilaterally, nonspecific, but statistically likely benign areas of mucoid impaction within terminal bronchioles. No follow-up needed if patient is low-risk (and has no known or suspected primary neoplasm). Non-contrast chest CT can be considered in 12 months if patient is high-risk. This recommendation follows the consensus statement: Guidelines for Management of Incidental Pulmonary Nodules Detected on CT Images: From the Fleischner Society 2017; Radiology 2017; 284:228-243. 7. Additional incidental findings, as above. Electronically Signed   By: Trudie Reed M.D.   On: 04/14/2021 07:03   CT T-SPINE NO CHARGE  Result Date: 04/14/2021 CLINICAL DATA:  80 year old male with history of trauma from a fall. Back pain. EXAM: CT CHEST, ABDOMEN, AND PELVIS WITH CONTRAST CT OF THE THORACIC SPINE WITHOUT CONTRAST CT OF THE LUMBAR SPINE WITHOUT CONTRAST TECHNIQUE: Multidetector CT imaging of the chest, abdomen and pelvis was performed following the standard protocol during bolus administration of intravenous contrast. Dedicated multiplanar reconstructions of the thoracic and lumbar spine were also generated for evaluation. CONTRAST:  75mL OMNIPAQUE IOHEXOL 300 MG/ML  SOLN COMPARISON:  Thoracic spine CT 12/19/2016. FINDINGS: CT CHEST FINDINGS Cardiovascular: No abnormal high attenuation fluid within the mediastinum to suggest posttraumatic mediastinal hematoma. No evidence of posttraumatic aortic dissection/transection. Heart size is enlarged with biatrial dilatation. Notably, there is a filling defect in the tip of the left atrial appendage (axial image 29 of series 2), concerning for thrombus. There is no significant pericardial fluid, thickening or pericardial calcification. There is aortic atherosclerosis, as well as atherosclerosis of the great vessels of the mediastinum and the coronary arteries, including calcified atherosclerotic  plaque in the left main, left anterior descending, left circumflex and right coronary arteries. Status post median sternotomy for CABG including LIMA to the LAD. Thickening calcification of the aortic valve. Calcifications of the mitral annulus. Mediastinum/Nodes: No pathologically enlarged mediastinal or hilar lymph nodes. Esophagus is unremarkable in appearance. No axillary lymphadenopathy. Lungs/Pleura: No pneumothorax. No acute consolidative airspace disease. Trace amount of pleural fluid and/or thickening in the posterolateral aspect of the right hemithorax adjacent to rib fractures (discussed below). No frank hemothorax. No left pleural fluid collection. A few scattered 1-2 mm pulmonary nodules are noted throughout the periphery of the lungs bilaterally, nonspecific. Musculoskeletal: Displaced acute fracture of the posterolateral aspect of  the right seventh rib. Nondisplaced acute fracture of the posterior aspect of the right eighth rib. Old healed fracture of the posterolateral right eighth rib. Probable nondisplaced fracture of the lateral aspect of the right eighth rib. Acute nondisplaced fracture of the posterior right ninth rib. Old healed fracture of the posterolateral right ninth rib. Possible nondisplaced fracture of the posterior right tenth rib. Rod and screw fixation hardware in place from T6-T11, with no evidence of hardware fracture or loosening. No acute displaced fractures of the thoracic spine. Mild compression of T2 vertebral body with 10% loss of anterior vertebral body height which appears to be chronic. CT ABDOMEN PELVIS FINDINGS Hepatobiliary: No evidence of acute traumatic injury to the liver. No suspicious cystic or solid hepatic lesions. No intra or extrahepatic biliary ductal dilatation. Gallbladder is normal in appearance. Pancreas: No evidence of acute traumatic injury to the pancreas. No pancreatic mass. No pancreatic ductal dilatation. No pancreatic or peripancreatic fluid  collections or inflammatory changes. Spleen: No evidence of acute traumatic injury to the spleen. Unremarkable. Adrenals/Urinary Tract: No evidence of acute traumatic injury to either kidney or adrenal gland. Multifocal cortical thinning in the kidneys bilaterally, likely sequela of prior infection or infarcts. In the anterolateral aspect of the lower pole of the left kidney (axial image 72 of series 2). There is an exophytic 4.0 x 2.7 cm intermediate attenuation (40 Hounsfield unit) lesion which is incompletely characterized on today's examination. Calcifications in both renal hila appear to be vascular. Bilateral adrenal glands are normal in appearance. No hydroureteronephrosis. Urinary bladder is partially obscured by extensive beam hardening artifact from the patient's left hip arthroplasty. With this limitation in mind, the urinary bladder appears intact and normal in appearance. Stomach/Bowel: No definitive evidence of significant acute traumatic injury to the hollow viscera. The appearance of the stomach is normal. No pathologic dilatation of small bowel or colon. 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 Normal appendix. Vascular/Lymphatic: No evidence of significant acute traumatic injury to the abdominal aorta or major arteries/veins of the abdomen or pelvis. Aortic atherosclerosis, without evidence of aneurysm or dissection in the abdominal or pelvic vasculature. No lymphadenopathy noted in the abdomen or pelvis. Reproductive: Prostate gland and seminal vesicles are largely obscured by beam hardening artifact from the patient's left hip arthroplasty. Other: No high attenuation fluid collection noted within the peritoneal cavity or retroperitoneum to suggest significant posttraumatic hemorrhage. No significant volume of ascites. No pneumoperitoneum. Musculoskeletal: No acute displaced fractures or aggressive appearing  lytic or blastic lesions are noted in the visualized portions of the skeleton. Dedicated imaging of the lumbar spine demonstrates a chronic appearing compression fracture of L1 with 25% loss of anterior vertebral body height. Status post left hip arthroplasty. IMPRESSION: 1. Multiple acute right-sided rib fractures. Most of these are nondisplaced as detailed above, however, there is a displaced fracture of the posterolateral aspect of the right seventh rib. This is associated with a trace volume of right-sided pleural fluid (not a hemothorax at this time) or thickening, but no pneumothorax noted. 2. No other evidence of significant acute traumatic injury to the abdomen or pelvis. 3. Cardiomegaly with biatrial dilatation. Notably, there is a small filling defect in the tip of the left atrial appendage concerning for left atrial appendage thrombus. If present, this would place the patient at risk for systemic embolization. Further evaluation with nonemergent transesophageal echocardiography is recommended in the near future if clinically appropriate. 4. Indeterminate lesion in  the lateral aspect of the lower pole of the left kidney. This may simply represent a proteinaceous/hemorrhagic cysts, however, further evaluation with nonemergent abdominal MRI with and without IV gadolinium is recommended in the near future to exclude neoplasm. 5. Aortic atherosclerosis, in addition to left main and 3 vessel coronary artery disease. Status post median sternotomy for CABG including LIMA to the LAD. 6. There are calcifications of the aortic valve and mitral annulus. Echocardiographic correlation for evaluation of potential valvular dysfunction may be warranted if clinically indicated. Multiple tiny 1-2 mm pulmonary nodules scattered throughout the periphery of the lungs bilaterally, nonspecific, but statistically likely benign areas of mucoid impaction within terminal bronchioles. No follow-up needed if patient is low-risk (and  has no known or suspected primary neoplasm). Non-contrast chest CT can be considered in 12 months if patient is high-risk. This recommendation follows the consensus statement: Guidelines for Management of Incidental Pulmonary Nodules Detected on CT Images: From the Fleischner Society 2017; Radiology 2017; 284:228-243. 7. Additional incidental findings, as above. Electronically Signed   By: Trudie Reed M.D.   On: 04/14/2021 07:03   CT L-SPINE NO CHARGE  Result Date: 04/14/2021 CLINICAL DATA:  80 year old male with history of trauma from a fall. Back pain. EXAM: CT CHEST, ABDOMEN, AND PELVIS WITH CONTRAST CT OF THE THORACIC SPINE WITHOUT CONTRAST CT OF THE LUMBAR SPINE WITHOUT CONTRAST TECHNIQUE: Multidetector CT imaging of the chest, abdomen and pelvis was performed following the standard protocol during bolus administration of intravenous contrast. Dedicated multiplanar reconstructions of the thoracic and lumbar spine were also generated for evaluation. CONTRAST:  75mL OMNIPAQUE IOHEXOL 300 MG/ML  SOLN COMPARISON:  Thoracic spine CT 12/19/2016. FINDINGS: CT CHEST FINDINGS Cardiovascular: No abnormal high attenuation fluid within the mediastinum to suggest posttraumatic mediastinal hematoma. No evidence of posttraumatic aortic dissection/transection. Heart size is enlarged with biatrial dilatation. Notably, there is a filling defect in the tip of the left atrial appendage (axial image 29 of series 2), concerning for thrombus. There is no significant pericardial fluid, thickening or pericardial calcification. There is aortic atherosclerosis, as well as atherosclerosis of the great vessels of the mediastinum and the coronary arteries, including calcified atherosclerotic plaque in the left main, left anterior descending, left circumflex and right coronary arteries. Status post median sternotomy for CABG including LIMA to the LAD. Thickening calcification of the aortic valve. Calcifications of the mitral  annulus. Mediastinum/Nodes: No pathologically enlarged mediastinal or hilar lymph nodes. Esophagus is unremarkable in appearance. No axillary lymphadenopathy. Lungs/Pleura: No pneumothorax. No acute consolidative airspace disease. Trace amount of pleural fluid and/or thickening in the posterolateral aspect of the right hemithorax adjacent to rib fractures (discussed below). No frank hemothorax. No left pleural fluid collection. A few scattered 1-2 mm pulmonary nodules are noted throughout the periphery of the lungs bilaterally, nonspecific. Musculoskeletal: Displaced acute fracture of the posterolateral aspect of the right seventh rib. Nondisplaced acute fracture of the posterior aspect of the right eighth rib. Old healed fracture of the posterolateral right eighth rib. Probable nondisplaced fracture of the lateral aspect of the right eighth rib. Acute nondisplaced fracture of the posterior right ninth rib. Old healed fracture of the posterolateral right ninth rib. Possible nondisplaced fracture of the posterior right tenth rib. Rod and screw fixation hardware in place from T6-T11, with no evidence of hardware fracture or loosening. No acute displaced fractures of the thoracic spine. Mild compression of T2 vertebral body with 10% loss of anterior vertebral body height which appears to be chronic. CT ABDOMEN PELVIS  FINDINGS Hepatobiliary: No evidence of acute traumatic injury to the liver. No suspicious cystic or solid hepatic lesions. No intra or extrahepatic biliary ductal dilatation. Gallbladder is normal in appearance. Pancreas: No evidence of acute traumatic injury to the pancreas. No pancreatic mass. No pancreatic ductal dilatation. No pancreatic or peripancreatic fluid collections or inflammatory changes. Spleen: No evidence of acute traumatic injury to the spleen. Unremarkable. Adrenals/Urinary Tract: No evidence of acute traumatic injury to either kidney or adrenal gland. Multifocal cortical thinning in the  kidneys bilaterally, likely sequela of prior infection or infarcts. In the anterolateral aspect of the lower pole of the left kidney (axial image 72 of series 2). There is an exophytic 4.0 x 2.7 cm intermediate attenuation (40 Hounsfield unit) lesion which is incompletely characterized on today's examination. Calcifications in both renal hila appear to be vascular. Bilateral adrenal glands are normal in appearance. No hydroureteronephrosis. Urinary bladder is partially obscured by extensive beam hardening artifact from the patient's left hip arthroplasty. With this limitation in mind, the urinary bladder appears intact and normal in appearance. Stomach/Bowel: No definitive evidence of significant acute traumatic injury to the hollow viscera. The appearance of the stomach is normal. No pathologic dilatation of small bowel or colon. 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 Normal appendix. Vascular/Lymphatic: No evidence of significant acute traumatic injury to the abdominal aorta or major arteries/veins of the abdomen or pelvis. Aortic atherosclerosis, without evidence of aneurysm or dissection in the abdominal or pelvic vasculature. No lymphadenopathy noted in the abdomen or pelvis. Reproductive: Prostate gland and seminal vesicles are largely obscured by beam hardening artifact from the patient's left hip arthroplasty. Other: No high attenuation fluid collection noted within the peritoneal cavity or retroperitoneum to suggest significant posttraumatic hemorrhage. No significant volume of ascites. No pneumoperitoneum. Musculoskeletal: No acute displaced fractures or aggressive appearing lytic or blastic lesions are noted in the visualized portions of the skeleton. Dedicated imaging of the lumbar spine demonstrates a chronic appearing compression fracture of L1 with 25% loss of anterior vertebral body height. Status post left  hip arthroplasty. IMPRESSION: 1. Multiple acute right-sided rib fractures. Most of these are nondisplaced as detailed above, however, there is a displaced fracture of the posterolateral aspect of the right seventh rib. This is associated with a trace volume of right-sided pleural fluid (not a hemothorax at this time) or thickening, but no pneumothorax noted. 2. No other evidence of significant acute traumatic injury to the abdomen or pelvis. 3. Cardiomegaly with biatrial dilatation. Notably, there is a small filling defect in the tip of the left atrial appendage concerning for left atrial appendage thrombus. If present, this would place the patient at risk for systemic embolization. Further evaluation with nonemergent transesophageal echocardiography is recommended in the near future if clinically appropriate. 4. Indeterminate lesion in the lateral aspect of the lower pole of the left kidney. This may simply represent a proteinaceous/hemorrhagic cysts, however, further evaluation with nonemergent abdominal MRI with and without IV gadolinium is recommended in the near future to exclude neoplasm. 5. Aortic atherosclerosis, in addition to left main and 3 vessel coronary artery disease. Status post median sternotomy for CABG including LIMA to the LAD. 6. There are calcifications of the aortic valve and mitral annulus. Echocardiographic correlation for evaluation of potential valvular dysfunction may be warranted if clinically indicated. Multiple tiny 1-2 mm pulmonary nodules scattered throughout the periphery of the lungs bilaterally, nonspecific, but statistically likely benign areas of mucoid  impaction within terminal bronchioles. No follow-up needed if patient is low-risk (and has no known or suspected primary neoplasm). Non-contrast chest CT can be considered in 12 months if patient is high-risk. This recommendation follows the consensus statement: Guidelines for Management of Incidental Pulmonary Nodules Detected  on CT Images: From the Fleischner Society 2017; Radiology 2017; [id number]. 7. Additional incidental findings, as above. Electronically Signed   By: Trudie Reed M.D.   On: 04/14/2021 07:03   DG Chest Port 1 View  Result Date: 04/19/2021 CLINICAL DATA:  Constipation and cough. The patient tested positive for COVID-19 today. EXAM: PORTABLE CHEST 1 VIEW COMPARISON:  Single-view of the chest 04/16/2021 and CT chest, abdomen and pelvis 04/14/2021. FINDINGS: The patient is status post CABG and thoracic spine fusion. There is cardiomegaly without edema. No consolidative process, pneumothorax or effusion. Right rib fracture seen on the prior CT are not visualized on this exam. IMPRESSION: Cardiomegaly without acute disease. Aortic Atherosclerosis (ICD10-I70.0). Electronically Signed   By: Drusilla Kanner M.D.   On: 04/19/2021 14:53   DG Chest Port 1 View  Result Date: 04/16/2021 CLINICAL DATA:  Increased shortness of breath.  Known rib fractures. EXAM: PORTABLE CHEST 1 VIEW COMPARISON:  January 16, 2021. FINDINGS: Enlarged cardiac silhouette. Central pulmonary vascular congestion. Mild diffuse interstitial prominence. No consolidation. No visible pleural effusions or pneumothorax on this single AP radiograph. Thoracic spinal fusion hardware. Median sternotomy. Right-sided rib fractures, better characterized on recent CT chest. IMPRESSION: 1. Cardiomegaly and central pulmonary vascular congestion. 2. Mild diffuse interstitial prominence, which may represent mild interstitial edema versus the sequela of recurrent bouts of CHF. 3. Right-sided rib fractures, better characterized on recent CT chest. Electronically Signed   By: Feliberto Harts MD   On: 04/16/2021 11:05   DG Abd Portable 1V  Result Date: 04/19/2021 CLINICAL DATA:  Constipation cough in a patient who is COVID-19 positive. EXAM: PORTABLE ABDOMEN - 1 VIEW COMPARISON:  None. FINDINGS: No free intraperitoneal air is seen. The bowel gas pattern is  nonobstructive. Moderate to moderately large volume of stool throughout the colon noted. No unexpected abdominal calcification. The patient is status post thoracic spine fusion and left hip replacement. IMPRESSION: No acute abnormality. Moderate to moderately large stool burden throughout the colon. Electronically Signed   By: Drusilla Kanner M.D.   On: 04/19/2021 14:47   DG Hip Unilat  With Pelvis 2-3 Views Right  Result Date: 04/14/2021 CLINICAL DATA:  Hip pain, fall EXAM: DG HIP (WITH OR WITHOUT PELVIS) 2-3V RIGHT COMPARISON:  None. FINDINGS: The osseous structures appear diffusely demineralized which may limit detection of small or nondisplaced fractures. Bones of the pelvis appear intact and congruent. Arcuate lines are contiguous. 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 Additional degenerative changes in the lower lumbar spine and bilateral SI joints. Enthesopathic changes noted about the pelvis. IMPRESSION: No discernible acute fracture or traumatic osseous injury. Prior left hip arthroplasty in expected positioning. Electronically Signed   By: Kreg Shropshire M.D.   On: 04/14/2021 01:01    Microbiology: Recent Results (from the past 240 hour(s))  Resp Panel by RT-PCR (Flu A&B, Covid) Nasopharyngeal Swab     Status: Abnormal   Collection Time: 04/19/21 10:12 AM   Specimen: Nasopharyngeal Swab; Nasopharyngeal(NP) swabs in vial transport medium  Result Value Ref Range Status  SARS Coronavirus 2 by RT PCR POSITIVE (A) NEGATIVE Final    Comment: RESULT CALLED TO, READ BACK BY AND VERIFIED WITH: MICHELLE AYERS RN  04/19/21 [facility] (NOTE) SARS-CoV-2 target nucleic acids are DETECTED.  The SARS-CoV-2 RNA is generally detectable in upper respiratory specimens  during the acute phase of infection. Positive results are indicative of the presence of the identified virus, but do not rule out bacterial infection or co-infection with other pathogens not detected by the test. Clinical correlation with patient history and other diagnostic information is necessary to determine patient infection status. The expected result is Negative.  Fact Sheet for Patients: BloggerCourse.com  Fact Sheet for Healthcare Providers: SeriousBroker.it  This test is not yet approved or cleared by the [place] FDA and  has been authorized for detection and/or diagnosis of SARS-CoV-2 by FDA under an Emergency Use Authorization (EUA).  This EUA will remain in effect (meaning this test can  be used) for the duration of  the COVID-19 declaration under Section 564(b)(1) of the Act, 21 U.S.C. section 360bbb-3(b)(1), unless the authorization is terminated or revoked sooner.     Influenza A by PCR NEGATIVE NEGATIVE Final   Influenza B by PCR NEGATIVE NEGATIVE Final    Comment: (NOTE) The Xpert Xpress SARS-CoV-2/FLU/RSV plus assay is intended as an aid in the diagnosis of influenza from Nasopharyngeal swab specimens and should not be used as a sole basis for treatment. Nasal washings and aspirates are unacceptable for Xpert Xpress SARS-CoV-2/FLU/RSV testing.  Fact Sheet for Patients: BloggerCourse.com  Fact Sheet for Healthcare Providers: SeriousBroker.it  This test is not yet approved or cleared by the [place] FDA and has been authorized for detection and/or diagnosis of SARS-CoV-2 by FDA under an Emergency Use Authorization (EUA). This EUA will remain in effect (meaning this test can be used) for the duration of the COVID-19 declaration under Section 564(b)(1) of the Act, 21 U.S.C. section 360bbb-3(b)(1), unless the authorization is terminated  or revoked.  Performed at [facility], [address] [place], [place] [address]      Labs: CBC: Recent Labs  Lab 04/13/21 2308 04/15/21 0432 04/16/21 0436 04/17/21 0422 04/18/21 0414  WBC 5.8 6.4 6.1 7.0 6.8  HGB 12.0* 11.3* 11.7* 12.3* 12.3*  HCT 36.8* 34.0* 35.9* 37.3* 37.1*  MCV 97.6 95.0 95.2 94.9 94.6  PLT 142* 128* 132* 144* 147*   Basic Metabolic Panel: Recent Labs  Lab 04/13/21 2308 04/15/21 0432 04/16/21 0436 04/17/21 0422 04/18/21 0414  NA 140 141 139 141 141  K 3.9 3.5 3.8 3.4* 3.7  CL 103 108 105 104 107  CO2 GLUCOSE 120* 142* 135* 136* 145*  BUN CREATININE 0.96 0.68 0.79 0.79 0.79  CALCIUM 10.4* 10.0 10.0 9.9 9.7  MG  --   --  1.5* 1.9  --    Liver Function Tests: No results for input(s): AST, ALT, ALKPHOS, BILITOT, PROT, ALBUMIN in the last 168 hours. CBG: Recent Labs  Lab 04/19/21 1134 04/19/21 1650 04/19/21 2136 04/20/21 0956 04/20/21 1143  GLUCAP 172* 146* 186* 208* 186*    Time spent: 35 minutes  Signed:  Lynden Oxford  [facility]  04/20/2021

## 2022-05-09 ENCOUNTER — Other Ambulatory Visit: Payer: Self-pay | Admitting: Family Medicine

## 2022-05-09 DIAGNOSIS — R1011 Right upper quadrant pain: Secondary | ICD-10-CM

## 2022-05-09 DIAGNOSIS — R1031 Right lower quadrant pain: Secondary | ICD-10-CM

## 2022-05-09 DIAGNOSIS — R1900 Intra-abdominal and pelvic swelling, mass and lump, unspecified site: Secondary | ICD-10-CM

## 2022-05-10 ENCOUNTER — Ambulatory Visit
Admission: RE | Admit: 2022-05-10 | Discharge: 2022-05-10 | Disposition: A | Discharge status: Home / Self Care | Payer: Medicare PPO | Source: Ambulatory Visit | Attending: Family Medicine | Admitting: Family Medicine

## 2022-05-10 DIAGNOSIS — R1031 Right lower quadrant pain: Secondary | ICD-10-CM | POA: Insufficient documentation

## 2022-05-10 DIAGNOSIS — R1011 Right upper quadrant pain: Secondary | ICD-10-CM | POA: Diagnosis present

## 2022-05-10 DIAGNOSIS — R1900 Intra-abdominal and pelvic swelling, mass and lump, unspecified site: Secondary | ICD-10-CM | POA: Diagnosis present

## 2022-05-10 MED ORDER — IOHEXOL 300 MG/ML  SOLN
100.0000 mL | Freq: Once | INTRAMUSCULAR | Status: AC | PRN
  Administered 2022-05-10: 100 mL via INTRAVENOUS

## 2022-08-01 ENCOUNTER — Encounter (INDEPENDENT_AMBULATORY_CARE_PROVIDER_SITE_OTHER): Payer: Self-pay

## 2022-12-20 ENCOUNTER — Emergency Department: Payer: Medicare PPO

## 2022-12-20 ENCOUNTER — Encounter: Payer: Self-pay | Admitting: Internal Medicine

## 2022-12-20 ENCOUNTER — Inpatient Hospital Stay
Admission: EM | Admit: 2022-12-20 | Discharge: 2023-01-02 | DRG: 871 | Disposition: E | Payer: Medicare PPO | Attending: Internal Medicine | Admitting: Internal Medicine

## 2022-12-20 ENCOUNTER — Other Ambulatory Visit: Payer: Self-pay

## 2022-12-20 DIAGNOSIS — R34 Anuria and oliguria: Secondary | ICD-10-CM | POA: Diagnosis not present

## 2022-12-20 DIAGNOSIS — Z8249 Family history of ischemic heart disease and other diseases of the circulatory system: Secondary | ICD-10-CM

## 2022-12-20 DIAGNOSIS — E8729 Other acidosis: Secondary | ICD-10-CM | POA: Diagnosis present

## 2022-12-20 DIAGNOSIS — G3183 Dementia with Lewy bodies: Secondary | ICD-10-CM | POA: Diagnosis present

## 2022-12-20 DIAGNOSIS — G9341 Metabolic encephalopathy: Secondary | ICD-10-CM | POA: Diagnosis present

## 2022-12-20 DIAGNOSIS — I482 Chronic atrial fibrillation, unspecified: Secondary | ICD-10-CM | POA: Diagnosis present

## 2022-12-20 DIAGNOSIS — J9601 Acute respiratory failure with hypoxia: Secondary | ICD-10-CM | POA: Diagnosis present

## 2022-12-20 DIAGNOSIS — E669 Obesity, unspecified: Secondary | ICD-10-CM | POA: Diagnosis present

## 2022-12-20 DIAGNOSIS — R7989 Other specified abnormal findings of blood chemistry: Secondary | ICD-10-CM | POA: Diagnosis not present

## 2022-12-20 DIAGNOSIS — Z66 Do not resuscitate: Secondary | ICD-10-CM | POA: Diagnosis present

## 2022-12-20 DIAGNOSIS — Z881 Allergy status to other antibiotic agents status: Secondary | ICD-10-CM

## 2022-12-20 DIAGNOSIS — E785 Hyperlipidemia, unspecified: Secondary | ICD-10-CM | POA: Diagnosis present

## 2022-12-20 DIAGNOSIS — G4733 Obstructive sleep apnea (adult) (pediatric): Secondary | ICD-10-CM | POA: Diagnosis present

## 2022-12-20 DIAGNOSIS — G40909 Epilepsy, unspecified, not intractable, without status epilepticus: Secondary | ICD-10-CM | POA: Diagnosis present

## 2022-12-20 DIAGNOSIS — Z6827 Body mass index (BMI) 27.0-27.9, adult: Secondary | ICD-10-CM

## 2022-12-20 DIAGNOSIS — A419 Sepsis, unspecified organism: Secondary | ICD-10-CM | POA: Diagnosis not present

## 2022-12-20 DIAGNOSIS — F0280 Dementia in other diseases classified elsewhere without behavioral disturbance: Secondary | ICD-10-CM | POA: Diagnosis present

## 2022-12-20 DIAGNOSIS — R4182 Altered mental status, unspecified: Secondary | ICD-10-CM | POA: Diagnosis present

## 2022-12-20 DIAGNOSIS — F02811 Dementia in other diseases classified elsewhere, unspecified severity, with agitation: Secondary | ICD-10-CM | POA: Diagnosis present

## 2022-12-20 DIAGNOSIS — E538 Deficiency of other specified B group vitamins: Secondary | ICD-10-CM | POA: Diagnosis present

## 2022-12-20 DIAGNOSIS — Z96651 Presence of right artificial knee joint: Secondary | ICD-10-CM | POA: Diagnosis present

## 2022-12-20 DIAGNOSIS — I10 Essential (primary) hypertension: Secondary | ICD-10-CM | POA: Diagnosis present

## 2022-12-20 DIAGNOSIS — I2510 Atherosclerotic heart disease of native coronary artery without angina pectoris: Secondary | ICD-10-CM | POA: Diagnosis present

## 2022-12-20 DIAGNOSIS — R54 Age-related physical debility: Secondary | ICD-10-CM | POA: Diagnosis present

## 2022-12-20 DIAGNOSIS — Z888 Allergy status to other drugs, medicaments and biological substances status: Secondary | ICD-10-CM

## 2022-12-20 DIAGNOSIS — R0902 Hypoxemia: Secondary | ICD-10-CM | POA: Diagnosis present

## 2022-12-20 DIAGNOSIS — L89311 Pressure ulcer of right buttock, stage 1: Secondary | ICD-10-CM | POA: Diagnosis present

## 2022-12-20 DIAGNOSIS — Z833 Family history of diabetes mellitus: Secondary | ICD-10-CM

## 2022-12-20 DIAGNOSIS — Z515 Encounter for palliative care: Secondary | ICD-10-CM

## 2022-12-20 DIAGNOSIS — Z96641 Presence of right artificial hip joint: Secondary | ICD-10-CM | POA: Diagnosis present

## 2022-12-20 DIAGNOSIS — Z79899 Other long term (current) drug therapy: Secondary | ICD-10-CM

## 2022-12-20 DIAGNOSIS — Z1152 Encounter for screening for COVID-19: Secondary | ICD-10-CM

## 2022-12-20 DIAGNOSIS — J690 Pneumonitis due to inhalation of food and vomit: Secondary | ICD-10-CM | POA: Diagnosis present

## 2022-12-20 DIAGNOSIS — R9431 Abnormal electrocardiogram [ECG] [EKG]: Secondary | ICD-10-CM | POA: Diagnosis present

## 2022-12-20 DIAGNOSIS — A403 Sepsis due to Streptococcus pneumoniae: Secondary | ICD-10-CM | POA: Diagnosis present

## 2022-12-20 DIAGNOSIS — Z96653 Presence of artificial knee joint, bilateral: Secondary | ICD-10-CM | POA: Diagnosis present

## 2022-12-20 DIAGNOSIS — Z87891 Personal history of nicotine dependence: Secondary | ICD-10-CM

## 2022-12-20 DIAGNOSIS — R6520 Severe sepsis without septic shock: Secondary | ICD-10-CM | POA: Diagnosis not present

## 2022-12-20 DIAGNOSIS — M459 Ankylosing spondylitis of unspecified sites in spine: Secondary | ICD-10-CM | POA: Diagnosis present

## 2022-12-20 DIAGNOSIS — R600 Localized edema: Secondary | ICD-10-CM | POA: Diagnosis present

## 2022-12-20 DIAGNOSIS — E119 Type 2 diabetes mellitus without complications: Secondary | ICD-10-CM | POA: Diagnosis present

## 2022-12-20 DIAGNOSIS — B953 Streptococcus pneumoniae as the cause of diseases classified elsewhere: Secondary | ICD-10-CM | POA: Diagnosis not present

## 2022-12-20 DIAGNOSIS — L8990 Pressure ulcer of unspecified site, unspecified stage: Secondary | ICD-10-CM | POA: Diagnosis present

## 2022-12-20 DIAGNOSIS — L89321 Pressure ulcer of left buttock, stage 1: Secondary | ICD-10-CM | POA: Diagnosis present

## 2022-12-20 DIAGNOSIS — Z951 Presence of aortocoronary bypass graft: Secondary | ICD-10-CM

## 2022-12-20 DIAGNOSIS — Z981 Arthrodesis status: Secondary | ICD-10-CM

## 2022-12-20 DIAGNOSIS — R7881 Bacteremia: Secondary | ICD-10-CM | POA: Diagnosis not present

## 2022-12-20 DIAGNOSIS — Z7189 Other specified counseling: Secondary | ICD-10-CM | POA: Diagnosis not present

## 2022-12-20 DIAGNOSIS — R6521 Severe sepsis with septic shock: Secondary | ICD-10-CM | POA: Diagnosis present

## 2022-12-20 DIAGNOSIS — Z8673 Personal history of transient ischemic attack (TIA), and cerebral infarction without residual deficits: Secondary | ICD-10-CM

## 2022-12-20 DIAGNOSIS — I872 Venous insufficiency (chronic) (peripheral): Secondary | ICD-10-CM | POA: Diagnosis present

## 2022-12-20 DIAGNOSIS — Z7984 Long term (current) use of oral hypoglycemic drugs: Secondary | ICD-10-CM

## 2022-12-20 DIAGNOSIS — Z96649 Presence of unspecified artificial hip joint: Secondary | ICD-10-CM | POA: Diagnosis present

## 2022-12-20 DIAGNOSIS — N179 Acute kidney failure, unspecified: Secondary | ICD-10-CM | POA: Diagnosis present

## 2022-12-20 DIAGNOSIS — I2489 Other forms of acute ischemic heart disease: Secondary | ICD-10-CM | POA: Diagnosis present

## 2022-12-20 DIAGNOSIS — Z7901 Long term (current) use of anticoagulants: Secondary | ICD-10-CM

## 2022-12-20 LAB — COMPREHENSIVE METABOLIC PANEL
ALT: 10 U/L (ref 0–44)
AST: 40 U/L (ref 15–41)
Albumin: 3.4 g/dL — ABNORMAL LOW (ref 3.5–5.0)
Alkaline Phosphatase: 59 U/L (ref 38–126)
Anion gap: 15 (ref 5–15)
BUN: 30 mg/dL — ABNORMAL HIGH (ref 8–23)
CO2: 24 mmol/L (ref 22–32)
Calcium: 9.7 mg/dL (ref 8.9–10.3)
Chloride: 102 mmol/L (ref 98–111)
Creatinine, Ser: 2.1 mg/dL — ABNORMAL HIGH (ref 0.61–1.24)
GFR, Estimated: 31 mL/min — ABNORMAL LOW (ref >60)
Glucose, Bld: 158 mg/dL — ABNORMAL HIGH (ref 70–99)
Potassium: 4.2 mmol/L (ref 3.5–5.1)
Sodium: 141 mmol/L (ref 135–145)
Total Bilirubin: 1.3 mg/dL — ABNORMAL HIGH (ref 0.3–1.2)
Total Protein: 7.5 g/dL (ref 6.5–8.1)

## 2022-12-20 LAB — RESPIRATORY PANEL BY PCR

## 2022-12-20 LAB — CBC WITH DIFFERENTIAL/PLATELET
Abs Immature Granulocytes: 0.02 10*3/uL (ref 0.00–0.07)
Basophils Absolute: 0 10*3/uL (ref 0.0–0.1)
Basophils Relative: 1 %
Eosinophils Absolute: 0 10*3/uL (ref 0.0–0.5)
Eosinophils Relative: 0 %
HCT: 35.7 % — ABNORMAL LOW (ref 39.0–52.0)
Hemoglobin: 11 g/dL — ABNORMAL LOW (ref 13.0–17.0)
Immature Granulocytes: 0 %
Lymphocytes Relative: 5 %
Lymphs Abs: 0.5 10*3/uL — ABNORMAL LOW (ref 0.7–4.0)
MCH: 31.6 pg (ref 26.0–34.0)
MCHC: 30.8 g/dL (ref 30.0–36.0)
MCV: 102.6 fL — ABNORMAL HIGH (ref 80.0–100.0)
Monocytes Absolute: 0.3 10*3/uL (ref 0.1–1.0)
Monocytes Relative: 4 %
Neutro Abs: 7.9 10*3/uL — ABNORMAL HIGH (ref 1.7–7.7)
Neutrophils Relative %: 90 %
Platelets: 154 10*3/uL (ref 150–400)
RBC: 3.48 MIL/uL — ABNORMAL LOW (ref 4.22–5.81)
RDW: 13.4 % (ref 11.5–15.5)
Smear Review: NORMAL
WBC: 8.9 10*3/uL (ref 4.0–10.5)
nRBC: 0 % (ref 0.0–0.2)

## 2022-12-20 LAB — URINALYSIS, W/ REFLEX TO CULTURE (INFECTION SUSPECTED)
Bacteria, UA: NONE SEEN
Bilirubin Urine: NEGATIVE
Glucose, UA: NEGATIVE mg/dL
Hgb urine dipstick: NEGATIVE
Ketones, ur: NEGATIVE mg/dL
Leukocytes,Ua: NEGATIVE
Nitrite: NEGATIVE
Protein, ur: NEGATIVE mg/dL
Specific Gravity, Urine: 1.015 (ref 1.005–1.030)
Squamous Epithelial / HPF: NONE SEEN /HPF (ref 0–5)
pH: 5 (ref 5.0–8.0)

## 2022-12-20 LAB — LACTIC ACID, PLASMA
Lactic Acid, Venous: 6.4 mmol/L (ref 0.5–1.9)
Lactic Acid, Venous: 4.1 mmol/L (ref 0.5–1.9)

## 2022-12-20 LAB — TROPONIN I (HIGH SENSITIVITY)
Troponin I (High Sensitivity): 4767 ng/L (ref <18)
Troponin I (High Sensitivity): 6939 ng/L (ref <18)
Troponin I (High Sensitivity): 7934 ng/L (ref <18)
Troponin I (High Sensitivity): 8992 ng/L (ref <18)

## 2022-12-20 LAB — GLUCOSE, CAPILLARY
Glucose-Capillary: 154 mg/dL — ABNORMAL HIGH (ref 70–99)
Glucose-Capillary: 142 mg/dL — ABNORMAL HIGH (ref 70–99)

## 2022-12-20 LAB — BLOOD GAS, VENOUS
Acid-base deficit: 2.1 mmol/L — ABNORMAL HIGH (ref 0.0–2.0)
Bicarbonate: 26.6 mmol/L (ref 20.0–28.0)
O2 Saturation: 64.9 %
Patient temperature: 37
pCO2, Ven: 62 mmHg — ABNORMAL HIGH (ref 44–60)
pH, Ven: 7.24 — ABNORMAL LOW (ref 7.25–7.43)
pO2, Ven: 45 mmHg (ref 32–45)

## 2022-12-20 LAB — MAGNESIUM
Magnesium: 1.1 mg/dL — ABNORMAL LOW (ref 1.7–2.4)
Magnesium: 1.6 mg/dL — ABNORMAL LOW (ref 1.7–2.4)

## 2022-12-20 LAB — PROTIME-INR
INR: 2.8 — ABNORMAL HIGH (ref 0.8–1.2)
Prothrombin Time: 29.3 seconds — ABNORMAL HIGH (ref 11.4–15.2)

## 2022-12-20 LAB — MRSA NEXT GEN BY PCR, NASAL: MRSA by PCR Next Gen: NOT DETECTED

## 2022-12-20 LAB — RESP PANEL BY RT-PCR (RSV, FLU A&B, COVID)  RVPGX2
Influenza A by PCR: NEGATIVE
Influenza B by PCR: NEGATIVE
Resp Syncytial Virus by PCR: NEGATIVE
SARS Coronavirus 2 by RT PCR: NEGATIVE

## 2022-12-20 LAB — APTT
aPTT: 35 seconds (ref 24–36)
aPTT: 194 seconds (ref 24–36)

## 2022-12-20 LAB — HEPARIN LEVEL (UNFRACTIONATED): Heparin Unfractionated: >1.1 IU/mL — ABNORMAL HIGH (ref 0.30–0.70)

## 2022-12-20 LAB — PROCALCITONIN: Procalcitonin: 4.94 ng/mL

## 2022-12-20 LAB — BRAIN NATRIURETIC PEPTIDE: B Natriuretic Peptide: 646.5 pg/mL — ABNORMAL HIGH (ref 0.0–100.0)

## 2022-12-20 MED ORDER — SODIUM CHLORIDE 0.9 % IV SOLN
2.0000 g | INTRAVENOUS | Status: DC
  Administered 2022-12-21: 2 g via INTRAVENOUS
  Filled 2022-12-20: qty 20
  Filled 2022-12-20: qty 2

## 2022-12-20 MED ORDER — MIDODRINE HCL 5 MG PO TABS: 10.0000 mg | ORAL_TABLET | Freq: Once | ORAL | Status: DC

## 2022-12-20 MED ORDER — SODIUM CHLORIDE 0.9 % IV SOLN: 500.0000 mg | INTRAVENOUS | Status: DC

## 2022-12-20 MED ORDER — SODIUM CHLORIDE 0.9 % IV BOLUS
250.0000 mL | Freq: Once | INTRAVENOUS | Status: AC
  Administered 2022-12-20: 250 mL via INTRAVENOUS

## 2022-12-20 MED ORDER — ACETAMINOPHEN 325 MG RE SUPP
650.0000 mg | Freq: Once | RECTAL | Status: AC
  Administered 2022-12-20: 650 mg via RECTAL
  Filled 2022-12-20: qty 2

## 2022-12-20 MED ORDER — HEPARIN (PORCINE) 25000 UT/250ML-% IV SOLN
850.0000 [IU]/h | INTRAVENOUS | Status: DC
  Administered 2022-12-21: 850 [IU]/h via INTRAVENOUS
  Filled 2022-12-20: qty 250

## 2022-12-20 MED ORDER — MAGNESIUM SULFATE IN D5W 1-5 GM/100ML-% IV SOLN
1.0000 g | Freq: Once | INTRAVENOUS | Status: AC
  Administered 2022-12-20: 1 g via INTRAVENOUS
  Filled 2022-12-20 (×2): qty 100

## 2022-12-20 MED ORDER — LORAZEPAM 2 MG/ML IJ SOLN
2.0000 mg | INTRAMUSCULAR | Status: DC | PRN
  Administered 2022-12-22: 2 mg via INTRAVENOUS
  Filled 2022-12-20: qty 1

## 2022-12-20 MED ORDER — LACTATED RINGERS IV BOLUS (SEPSIS)
500.0000 mL | Freq: Once | INTRAVENOUS | Status: AC
  Administered 2022-12-20: 500 mL via INTRAVENOUS

## 2022-12-20 MED ORDER — VALPROATE SODIUM 100 MG/ML IV SOLN: 500.0000 mg | Freq: Three times a day (TID) | INTRAVENOUS | Status: DC

## 2022-12-20 MED ORDER — SODIUM CHLORIDE 0.9 % IV SOLN
500.0000 mg | INTRAVENOUS | Status: DC
  Administered 2022-12-20: 500 mg via INTRAVENOUS
  Filled 2022-12-20: qty 5

## 2022-12-20 MED ORDER — HEPARIN (PORCINE) 25000 UT/250ML-% IV SOLN
1150.0000 [IU]/h | INTRAVENOUS | Status: DC
  Administered 2022-12-20: 1150 [IU]/h via INTRAVENOUS
  Filled 2022-12-20: qty 250

## 2022-12-20 MED ORDER — ACETAMINOPHEN 650 MG RE SUPP: 650.0000 mg | Freq: Four times a day (QID) | RECTAL | Status: DC | PRN

## 2022-12-20 MED ORDER — INSULIN ASPART 100 UNIT/ML IJ SOLN
0.0000 [IU] | Freq: Three times a day (TID) | INTRAMUSCULAR | Status: DC
  Administered 2022-12-20: 1 [IU] via SUBCUTANEOUS
  Filled 2022-12-20: qty 1

## 2022-12-20 MED ORDER — SODIUM CHLORIDE 0.9 % IV BOLUS
500.0000 mL | Freq: Once | INTRAVENOUS | Status: AC
  Administered 2022-12-20: 500 mL via INTRAVENOUS

## 2022-12-20 MED ORDER — INSULIN ASPART 100 UNIT/ML IJ SOLN: 0.0000 [IU] | Freq: Every day | INTRAMUSCULAR | Status: DC

## 2022-12-20 MED ORDER — SENNOSIDES-DOCUSATE SODIUM 8.6-50 MG PO TABS: 1.0000 | ORAL_TABLET | Freq: Every evening | ORAL | Status: DC | PRN

## 2022-12-20 MED ORDER — ATORVASTATIN CALCIUM 20 MG PO TABS: 80.0000 mg | ORAL_TABLET | Freq: Every day | ORAL | Status: DC

## 2022-12-20 MED ORDER — VALPROATE SODIUM 100 MG/ML IV SOLN
500.0000 mg | Freq: Three times a day (TID) | INTRAVENOUS | Status: AC
  Administered 2022-12-20 – 2022-12-21 (×4): 500 mg via INTRAVENOUS
  Filled 2022-12-20 (×4): qty 5

## 2022-12-20 MED ORDER — HEPARIN SODIUM (PORCINE) 5000 UNIT/ML IJ SOLN: 5000.0000 [IU] | Freq: Three times a day (TID) | INTRAMUSCULAR | Status: DC

## 2022-12-20 MED ORDER — SODIUM CHLORIDE 0.9 % IV SOLN
500.0000 mg | INTRAVENOUS | Status: DC
  Filled 2022-12-20: qty 5

## 2022-12-20 MED ORDER — MAGNESIUM SULFATE 2 GM/50ML IV SOLN
2.0000 g | Freq: Once | INTRAVENOUS | Status: AC
  Administered 2022-12-20: 2 g via INTRAVENOUS
  Filled 2022-12-20: qty 50

## 2022-12-20 MED ORDER — METOPROLOL TARTRATE 25 MG PO TABS
12.5000 mg | ORAL_TABLET | Freq: Two times a day (BID) | ORAL | Status: DC
  Filled 2022-12-20: qty 1

## 2022-12-20 MED ORDER — RIVASTIGMINE TARTRATE 1.5 MG PO CAPS
1.5000 mg | ORAL_CAPSULE | Freq: Two times a day (BID) | ORAL | Status: DC
  Filled 2022-12-20: qty 1

## 2022-12-20 MED ORDER — ACETAMINOPHEN 325 MG PO TABS: 650.0000 mg | ORAL_TABLET | Freq: Four times a day (QID) | ORAL | Status: DC | PRN

## 2022-12-20 MED ORDER — SODIUM CHLORIDE 0.9 % IV SOLN
2.0000 g | INTRAVENOUS | Status: DC
  Administered 2022-12-20: 2 g via INTRAVENOUS
  Filled 2022-12-20: qty 20

## 2022-12-20 MED ORDER — NOREPINEPHRINE 4 MG/250ML-% IV SOLN: 0.0000 ug/min | INTRAVENOUS | Status: DC

## 2022-12-20 MED ORDER — SODIUM CHLORIDE 0.9 % IV BOLUS: 500.0000 mL | Freq: Once | INTRAVENOUS | Status: DC

## 2022-12-20 MED ORDER — ORAL CARE MOUTH RINSE
15.0000 mL | OROMUCOSAL | Status: DC
  Administered 2022-12-20 – 2022-12-22 (×7): 15 mL via OROMUCOSAL

## 2022-12-20 MED ORDER — NOREPINEPHRINE 4 MG/250ML-% IV SOLN
2.0000 ug/min | INTRAVENOUS | Status: DC
  Administered 2022-12-20: 2 ug/min via INTRAVENOUS
  Administered 2022-12-21: 10 ug/min via INTRAVENOUS
  Filled 2022-12-20 (×2): qty 250

## 2022-12-20 MED ORDER — SODIUM CHLORIDE 0.9 % IV BOLUS: 1000.0000 mL | Freq: Once | INTRAVENOUS | Status: DC

## 2022-12-20 MED ORDER — VALPROATE SODIUM 100 MG/ML IV SOLN
1700.0000 mg | Freq: Once | INTRAVENOUS | Status: AC
  Administered 2022-12-20: 1700 mg via INTRAVENOUS
  Filled 2022-12-20: qty 17

## 2022-12-20 MED ORDER — QUETIAPINE FUMARATE 25 MG PO TABS: 50.0000 mg | ORAL_TABLET | Freq: Every day | ORAL | Status: DC

## 2022-12-20 MED ORDER — ASPIRIN 300 MG RE SUPP
300.0000 mg | Freq: Once | RECTAL | Status: AC
  Administered 2022-12-20: 300 mg via RECTAL
  Filled 2022-12-20: qty 1

## 2022-12-20 MED ORDER — ONDANSETRON HCL 4 MG PO TABS: 4.0000 mg | ORAL_TABLET | Freq: Four times a day (QID) | ORAL | Status: DC | PRN

## 2022-12-20 MED ORDER — ORAL CARE MOUTH RINSE: 15.0000 mL | OROMUCOSAL | Status: DC | PRN

## 2022-12-20 MED ORDER — ONDANSETRON HCL 4 MG/2ML IJ SOLN: 4.0000 mg | Freq: Four times a day (QID) | INTRAMUSCULAR | Status: DC | PRN

## 2022-12-20 MED ORDER — LACTATED RINGERS IV SOLN
150.0000 mL/h | INTRAVENOUS | Status: DC
  Administered 2022-12-20 – 2022-12-21 (×4): 150 mL/h via INTRAVENOUS

## 2022-12-20 MED ORDER — SODIUM CHLORIDE 0.9 % IV SOLN
250.0000 mL | INTRAVENOUS | Status: DC
  Administered 2022-12-20: 250 mL via INTRAVENOUS

## 2022-12-20 MED ORDER — QUETIAPINE FUMARATE 25 MG PO TABS: 25.0000 mg | ORAL_TABLET | Freq: Two times a day (BID) | ORAL | Status: DC | PRN

## 2022-12-20 NOTE — Sepsis Progress Note (Signed)
eLink is following this Code Sepsis. °

## 2022-12-20 NOTE — Assessment & Plan Note (Signed)
Gated by patient with obstructive sleep apnea, does not wear CPAP machine and severe sepsis presumed secondary to community-acquired/atypical pneumonia

## 2022-12-20 NOTE — Assessment & Plan Note (Signed)
Holding Eliquis while on heparin gtt. Telemetry monitoring.

## 2022-12-20 NOTE — Assessment & Plan Note (Addendum)
Presumed secondary to severe sepsis complicated by hypomagnesemia Status post magnesium 2 g IV one-time dose Check magnesium level in a.m. Holding home Keppra 1000 mg p.o. at this time, discussed with neurology for temporary alternative Ativan 2 mg IV as needed, 2 doses ordered for seizure Serial EKG every 3 hours, 6 times ordered on admission

## 2022-12-20 NOTE — Assessment & Plan Note (Signed)
Presumed multifactorial in setting of poor p.o. intake, could be prerenal and intrarenal in setting of severe sepsis Status post sodium chloride 750 ml bolus per EDP I ordered additional LR 500 mL bolus and lactated ringer infusion at 150 mL/h, 20 hours ordered Strict I's and O's Repeat BMP in a.m.

## 2022-12-20 NOTE — Assessment & Plan Note (Signed)
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

## 2022-12-20 NOTE — Assessment & Plan Note (Signed)
With hypotension and multiple organ dysfunction: Cardiac, renal, respiratory, encephalopathy Etiology workup in progress Blood cultures x 2 are in process, check procalcitonin, continue to follow lactic acid, MRSA PCR, 20 pathogen respiratory panel ordered Continue ceftriaxone 2 g IV daily, 4 additional doses to complete 5-day course Azithromycin was discontinued due to new onset QT prolongation, doxycycline 100 mg IV twice daily were considered however patient has reported history of oral swelling with doxycycline Pharmacy consulted for antibiotic therapy for atypical pneumonia coverage Continue BiPAP therapy Admit to stepdown, inpatient

## 2022-12-20 NOTE — Consult Note (Signed)
ANTICOAGULATION CONSULT NOTE   Pharmacy Consult for heparin Indication: chest pain/ACS  Allergies  Allergen Reactions   Haloperidol Other (See Comments)    Catatonic    Doxycycline Swelling   Lovastatin Swelling and Other (See Comments)    Oral swelling, not requiring ER, no problem with Lipitor    Patient Measurements: Height: 6' (182.9 cm) Weight: 97 kg (213 lb 13.5 oz) IBW/kg (Calculated) : 77.6 Heparin Dosing Weight: 97 kg  Vital Signs: Temp: 98 F (36.7 C) (03/19 1455) Temp Source: Axillary (03/19 1455) BP: 103/57 (03/19 2300) Pulse Rate: 76 (03/19 2300)  Labs: Recent Labs    12/17/2022 0957 12/17/2022 1236 12/13/2022 1521 12/06/2022 1715 12/23/2022 2127  HGB 11.0*  --   --   --   --   HCT 35.7*  --   --   --   --   PLT 154  --   --   --   --   APTT 35  --   --   --  194*  LABPROT 29.3*  --   --   --   --   INR 2.8*  --   --   --   --   HEPARINUNFRC >1.10*  --   --   --   --   CREATININE 2.10*  --   --   --   --   YKDXIPJASNK 5,397* 6,734* 1,937* 9,024*  --      Estimated Creatinine Clearance: 32.8 mL/min (A) (by C-G formula based on SCr of 2.1 mg/dL (H)).   Medical History: Past Medical History:  Diagnosis Date   A-fib ([place])    Diabetes mellitus without complication ([place])    Hypertension     Medications:  Medications Prior to Admission  Medication Sig Dispense Refill Last Dose   acetaminophen (TYLENOL) 325 MG tablet Take 2 tablets (650 mg total) by mouth every 6 (six) hours as needed for mild pain, fever or headache.   01/01/2023 at 0400   amLODipine (NORVASC) 5 MG tablet Take 1 tablet (5 mg total) by mouth daily. 30 tablet 0 12/19/2022   apixaban (ELIQUIS) 5 MG TABS tablet Take 1 tablet (5 mg total) by mouth 2 (two) times daily. 60 tablet  12/19/2022 at 2000   atorvastatin (LIPITOR) 80 MG tablet Take 80 mg by mouth daily.   12/19/2022 at 2000   bisacodyl (DULCOLAX) 10 MG suppository Place 1 suppository (10 mg total) rectally daily as needed for severe  constipation. 12 suppository 0 prn at unk   bisacodyl (DULCOLAX) 5 MG EC tablet Take 1 tablet (5 mg total) by mouth daily as needed for moderate constipation. 30 tablet 0 prn at unk   cholecalciferol (VITAMIN D3) 25 MCG (1000 UNIT) tablet Take 1,000 Units by mouth daily.   12/19/2022   famotidine (PEPCID) 20 MG tablet Take 1 tablet (20 mg total) by mouth daily. 30 tablet 0 prn at unk   hydrocerin (EUCERIN) CREA Apply 1 application topically 2 (two) times daily.  0 prn at unk   ketoconazole (NIZORAL) 2 % shampoo Apply 1 application topically 2 (two) times a week.   prn at unk   levETIRAcetam (KEPPRA) 1000 MG tablet 1,000 mg 2 (two) times daily.   12/19/2022 at 0800   lidocaine (LIDODERM) 5 % Place 1 patch onto the skin daily. Remove & Discard patch within 12 hours or as directed by MD 30 patch 0 prn at unk   metFORMIN (GLUCOPHAGE) 1000 MG tablet Take 1,000 mg by mouth  2 (two) times daily.   12/19/2022 at 0800   metoprolol tartrate (LOPRESSOR) 25 MG tablet Take 0.5 tablets (12.5 mg total) by mouth 2 (two) times daily. 30 tablet 0 12/19/2022 at 0800   Multiple Vitamin (MULTIVITAMIN WITH MINERALS) TABS tablet Take 1 tablet by mouth daily.   12/19/2022 at 0800   nystatin cream (MYCOSTATIN) Apply 1 Application topically 2 (two) times daily.   prn at unk   polyethylene glycol (MIRALAX / GLYCOLAX) 17 g packet Take 17 g by mouth daily. 14 each 0 prn at unk   QUEtiapine (SEROQUEL) 25 MG tablet Take 1 tablet (25 mg total) by mouth 2 (two) times daily as needed (agitation).   12/19/2022   QUEtiapine (SEROQUEL) 50 MG tablet Take 1 tablet (50 mg total) by mouth at bedtime.   12/19/2022   ramipril (ALTACE) 10 MG capsule Take 10 mg by mouth 2 (two) times daily.   12/19/2022 at 2000   rivastigmine (EXELON) 1.5 MG capsule Take 1 capsule (1.5 mg total) by mouth 2 (two) times daily.   12/19/2022 at 0800   senna-docusate (SENOKOT-S) 8.6-50 MG tablet Take 1 tablet by mouth 2 (two) times daily.   prn at unk   torsemide (DEMADEX)  20 MG tablet Take 20 mg by mouth daily.   12/19/2022 at 0800   triamcinolone cream (KENALOG) 0.1 % Apply topically 2 (two) times daily. 30 g 0 prn at unk   feeding supplement (ENSURE ENLIVE / ENSURE PLUS) LIQD Take 237 mLs by mouth 2 (two) times daily between meals. 237 mL 12    Scheduled:   insulin aspart  0-5 Units Subcutaneous QHS   insulin aspart  0-9 Units Subcutaneous TID WC   [START ON 12/21/2022] metoprolol tartrate  12.5 mg Oral BID   mouth rinse  15 mL Mouth Rinse 4 times per day   Infusions:   sodium chloride 10 mL/hr at 12/06/2022 2100   [START ON 12/21/2022] azithromycin     [START ON 12/21/2022] cefTRIAXone (ROCEPHIN)  IV     heparin 1,150 Units/hr (12/05/2022 2100)   lactated ringers 150 mL/hr (12/30/2022 2152)   magnesium sulfate bolus IVPB     norepinephrine (LEVOPHED) Adult infusion 10 mcg/min (12/27/2022 2100)   valproate sodium 500 mg (12/09/2022 2220)   PRN: acetaminophen **OR** acetaminophen, LORazepam, ondansetron **OR** ondansetron (ZOFRAN) IV, mouth rinse, senna-docusate Anti-infectives (From admission, onward)    Start     Dose/Rate Route Frequency Ordered Stop   12/21/22 1200  azithromycin (ZITHROMAX) 500 mg in sodium chloride 0.9 % 250 mL IVPB        500 mg 250 mL/hr over 60 Minutes Intravenous Every 24 hours 12/16/2022 1349 12/25/22 1159   12/21/22 1000  cefTRIAXone (ROCEPHIN) 2 g in sodium chloride 0.9 % 100 mL IVPB        2 g 200 mL/hr over 30 Minutes Intravenous Every 24 hours 12/02/2022 1246 12/25/22 0959   12/21/22 1000  azithromycin (ZITHROMAX) 500 mg in sodium chloride 0.9 % 250 mL IVPB  Status:  Discontinued        500 mg 250 mL/hr over 60 Minutes Intravenous Every 24 hours 12/08/2022 1246 12/25/2022 1247   12/06/2022 1045  cefTRIAXone (ROCEPHIN) 2 g in sodium chloride 0.9 % 100 mL IVPB  Status:  Discontinued        2 g 200 mL/hr over 30 Minutes Intravenous Every 24 hours 12/13/2022 1040 12/19/2022 1300   12/04/2022 1045  azithromycin (ZITHROMAX) 500 mg in sodium chloride  0.9 % 250  mL IVPB  Status:  Discontinued        500 mg 250 mL/hr over 60 Minutes Intravenous Every 24 hours 12/21/2022 1040 12/03/2022 1247       Assessment: Pt is an 58 YOM presenting with concerns of NSTEMI. Troponin is elevated at 4.8k. PMH includes Afib, stroke, HTN, dementia, HLD, CAD. Last dose of apixaban was last night (12/19/2022 PM). INR is elevated at 2.8, patient is not on warfarin and no signs of cirrhosis based on labs. Unsure of reason why INR is elevated. CBC currently stable. No note of bleeding. Since patient was on apixaban prior to admission, predict anti-Xa level will be falsely elevated. Will monitor with aPTTs.   Goal of Therapy:  aPTT 66-102 seconds Monitor platelets by anticoagulation protocol: Yes  03/19 2127 aPTT 194, supratherapeutic   Plan:  Contacted RN.  Hold heparin infusion for ~ 1 hr Restart heparin infusion at 900 units/hr Recheck aPTT level in 8 hours. Switch to heparin level monitoring once aPTT and heparin level correlate.  Daily heparin level and CBC while on heparin.  Renda Rolls, PharmD, [facility] 12/10/2022 11:14 PM

## 2022-12-20 NOTE — Progress Notes (Signed)
An USGPIV (ultrasound guided PIV) on RFA 20G x 1.88 has been placed for short-term vasopressor infusion. A correctly placed ivWatch must be used when administering Vasopressors. Should this treatment be needed beyond 72 hours, central line access should be obtained.  It will be the responsibility of the bedside nurse to follow best practice to prevent extravasations.   

## 2022-12-20 NOTE — Consult Note (Signed)
ANTICOAGULATION CONSULT NOTE - Initial Consult  Pharmacy Consult for heparin Indication: chest pain/ACS  Allergies  Allergen Reactions   Haloperidol Other (See Comments)    Catatonic    Doxycycline Swelling   Lovastatin Swelling and Other (See Comments)    Oral swelling, not requiring ER, no problem with Lipitor    Patient Measurements: Height: 6' (182.9 cm) Weight: 97 kg (213 lb 13.5 oz) IBW/kg (Calculated) : 77.6 Heparin Dosing Weight: 97 kg  Vital Signs: Temp: 101.9 F (38.8 C) (03/19 0945) Temp Source: Rectal (03/19 0945) BP: 83/63 (03/19 1300) Pulse Rate: 85 (03/19 1300)  Labs: Recent Labs    12/21/2022 0957  HGB 11.0*  HCT 35.7*  PLT 154  APTT 35  LABPROT 29.3*  INR 2.8*  CREATININE 2.10*  TROPONINIHS 4,767*    Estimated Creatinine Clearance: 32.8 mL/min (A) (by C-G formula based on SCr of 2.1 mg/dL (H)).   Medical History: Past Medical History:  Diagnosis Date   A-fib ([place])    Diabetes mellitus without complication ([place])    Hypertension     Medications:  (Not in a hospital admission)  Scheduled:   midodrine  10 mg Oral Once   Infusions:   [START ON 12/21/2022] cefTRIAXone (ROCEPHIN)  IV     lactated ringers     lactated ringers     magnesium sulfate bolus IVPB 2 g (12/02/2022 1235)   PRN: acetaminophen **OR** acetaminophen, ondansetron **OR** ondansetron (ZOFRAN) IV, senna-docusate Anti-infectives (From admission, onward)    Start     Dose/Rate Route Frequency Ordered Stop   12/21/22 1000  cefTRIAXone (ROCEPHIN) 2 g in sodium chloride 0.9 % 100 mL IVPB        2 g 200 mL/hr over 30 Minutes Intravenous Every 24 hours 12/12/2022 1246 12/25/22 0959   12/21/22 1000  azithromycin (ZITHROMAX) 500 mg in sodium chloride 0.9 % 250 mL IVPB  Status:  Discontinued        500 mg 250 mL/hr over 60 Minutes Intravenous Every 24 hours 12/14/2022 1246 12/19/2022 1247   12/14/2022 1045  cefTRIAXone (ROCEPHIN) 2 g in sodium chloride 0.9 % 100 mL IVPB  Status:   Discontinued        2 g 200 mL/hr over 30 Minutes Intravenous Every 24 hours 12/19/2022 1040 12/12/2022 1300   12/19/2022 1045  azithromycin (ZITHROMAX) 500 mg in sodium chloride 0.9 % 250 mL IVPB  Status:  Discontinued        500 mg 250 mL/hr over 60 Minutes Intravenous Every 24 hours 12/07/2022 1040 12/28/2022 1247       Assessment: Pt is an 61 YOM presenting with concerns of NSTEMI. Troponin is elevated at 4.8k. PMH includes Afib, stroke, HTN, dementia, HLD, CAD. Last dose of apixaban was last night (12/19/2022 PM). INR is elevated at 2.8, patient is not on warfarin and no signs of cirrhosis based on labs. Unsure of reason why INR is elevated. CBC currently stable. No note of bleeding. Since patient was on apixaban prior to admission, predict anti-Xa level will be falsely elevated. Will monitor with aPTTs.   Goal of Therapy:  aPTT 66-102 seconds Monitor platelets by anticoagulation protocol: Yes   Plan:  No bolus due to elevated INR. Start heparin infusion at 1,150 units/hr Check aPTT level in 8 hours. Switch to heparin level monitoring once aPTT and heparin level correlate. Daily heparin level and CBC while on heparin.   Darrall Dears, PharmD Candidate  12/28/2022,1:08 PM

## 2022-12-20 NOTE — Assessment & Plan Note (Signed)
Presumed secondary to severe sepsis, if patient's mentation does not improve and patient has been treated with fluids and antibiotics for 24 hours, would recommend a.m. team to consider CT head/MRI of the brain Patient is able to weakly follow commands by squeezing my hands and wiggling his toes when asked to do so, I have low clinical suspicion for CVA at this time

## 2022-12-20 NOTE — H&P (Addendum)
Addendum at 1535 on day of admission  Severe sepsis with multiorgan involvement with septic shock initially responsive to IV fluid Elevated troponin, positive delta Prolonged QTc-persistent - Continue heparin per pharmacy - Initiated Levophed gtt. - Discussed with family over the phone regarding consulting ICU for central line and/or management.  Family states that they would like to avoid invasive procedures including central line and art line at this time.  They are okay with medicine hospitalist management of the patient on Levophed at this time - Family confirms DNR - Continue acute care  Second addendum Evaluated patient at bedside at approximately 1630 post Levophed initiation, patient is more alert and able to state 'I am fine' under the BiPAP mask.  Family at bedside.  Patient is MAP improving along with mentation. - Continue acute care - Confirmed that family does not want to go beyond 10 mcg which would require central line - Palliative care, spiritual care consulted  Hypotension/septic shock-currently on Levophed - Maxed out at 10 mcg     History and Physical   Ian King [id number] DOB: 1940/11/21 DOA: 12/03/2022  PCP: Idelle Crouch, MD  Outpatient Specialists: Dr. Lesle Reek, [facility] Patient coming from: Home via EMS  I have personally briefly reviewed patient's old medical records in [place].  Chief Concern: Shortness of breath, cough, increasing weakness  HPI: Mr. Ian King is a 82 year old male with history of chronic atrial fibrillation, hypertension, seizure disorder, B12 deficiency, hyperlipidemia, Lewy body dementia with behavioral disturbances, CAD, non-insulin-dependent diabetes mellitus who presents emergency department for chief concerns of shortness of breath and weakness with productive cough.  Vitals in the ED showed temperature of 101.9, respiration rate of 41, blood pressure 116/57, heart rate of 94, SpO2 of 88% on 6 L  nasal cannula.  Patient was placed on BiPAP and is tolerating.  VBG showed 7.2 4/62/45.  Serum sodium is 141, potassium 4.2, chloride 102, bicarb 24, BUN of 30, serum creatinine of 2.50, EGFR 31, nonfasting blood glucose 158, WBC 8.9, hemoglobin 11, platelets of 154.  BNP was elevated at 646.5.  High sensitive troponin elevated at 4767.  Lactic acid elevated at 6.4.  COVID/influenza A/influenza B/RSV PCR were negative.  UA was negative for leukocytes and nitrates.  Portable chest x-ray: Was read as diffuse bilateral interstitial opacity, most pronounced in the perihilar and bibasilar regions.  Findings may reflect pulmonary edema versus multifocal atypical viral infection.  ED treatment: Acetaminophen 650 mg p.o. one-time dose, aspirin 300, azithromycin 500 mg IV, ceftriaxone 2 g IV. ------------------------------- At bedside, patient is minimally responsive, and nods.  Per family, patient would know his wife, daughter, grandkids, and who is running for president. He would know his age. He would not know the year or the month.   [name] states that patient has been having increase cough with productive sputum, since Sunday. The sputum is clear. She reports unchange urination. She states that he has not been eating or drinking well for at least three days. He did not have an appetite.   He mumbles at the sound of his daugther's voice at bedside.  Social history: He lives at home with his spouse, [name] of 59 years. He does not smoke, etoh, and recreational drug use. 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   ROS: Unable to complete due to patient acuity  ED Course: Discussed with emergency medicine provider, patient requiring hospitalization for chief concerns of severe sepsis.  Assessment/Plan  Principal Problem:   Severe sepsis ([place]) Active Problems:   Ankylosing spondylitis (HCC)   Bilateral edema of lower extremity   CAD (coronary  artery disease)   Chronic atrial fibrillation (HCC)   HTN (hypertension)   Hyperlipidemia   Seizure disorder (HCC)   Chronic venous insufficiency   AMS (altered mental status)   Prolonged QT interval   Elevated troponin   AKI (acute kidney injury) ([place])   Respiratory acidosis   Hypomagnesemia   Pressure injury of skin   Septic shock (HCC)   Assessment and Plan:  * Severe sepsis (HCC) With hypotension and multiple organ dysfunction: Cardiac, renal, respiratory, encephalopathy Etiology workup in progress Blood cultures x 2 are in process, check procalcitonin, continue to follow lactic acid, MRSA PCR, 20 pathogen respiratory panel ordered Continue ceftriaxone 2 g IV daily, 4 additional doses to complete 5-day course Azithromycin was discontinued due to new onset QT prolongation, doxycycline 100 mg IV twice daily were considered however patient has reported history of oral swelling with doxycycline Pharmacy consulted for antibiotic therapy for atypical pneumonia coverage Continue BiPAP therapy Admit to stepdown, inpatient  Hypomagnesemia Status post magnesium 2 g IV one-time dose ordered Recheck magnesium level at 20:20-hour Check magnesium level in the a.m.  Respiratory acidosis Gated by patient with obstructive sleep apnea, does not wear CPAP machine and severe sepsis presumed secondary to community-acquired/atypical pneumonia  AKI (acute kidney injury) ([place]) Presumed multifactorial in setting of poor p.o. intake, could be prerenal and intrarenal in setting of severe sepsis Status post sodium chloride 750 ml bolus per EDP I ordered additional LR 500 mL bolus and lactated ringer infusion at 150 mL/h, 20 hours ordered Strict I's and O's Repeat BMP in a.m.  Elevated troponin Heparin per pharmacy ordered Continue to trend high sensitive troponin Etiology workup in progress, presumed secondary to severe sepsis  Prolonged QT interval Presumed secondary to severe sepsis  complicated by hypomagnesemia Status post magnesium 2 g IV one-time dose Check magnesium level in a.m. Holding home Keppra 1000 mg p.o. at this time, discussed with neurology for temporary alternative Ativan 2 mg IV as needed, 2 doses ordered for seizure Serial EKG every 3 hours, 6 times ordered on admission  AMS (altered mental status) Presumed secondary to severe sepsis, if patient's mentation does not improve and patient has been treated with fluids and antibiotics for 24 hours, would recommend a.m. team to consider CT head/MRI of the brain Patient is able to weakly follow commands by squeezing my hands and wiggling his toes when asked to do so, I have low clinical suspicion for CVA at this time  Seizure disorder ([place]) Per family at bedside, patient has not had a seizure in over 40 years Holding home Keppra 1000 mg twice daily due to prolonged QT Curbside discussed alternative with neurology who recommends initial loading dose with valproic acid 20 mg/kg followed by 500 mg IV 3 times daily When patient is ready to restart Keppra, the Keppra dosing will need to be renally dose unless GFR improves.  Alternatively can justify stopping Keppra long-term and continue valproic acid indefinitely as his new anticonvulsant given the event with prolonged QT  Hyperlipidemia Atorvastatin 80 mg nightly not resumed admission  Chronic atrial fibrillation (HCC) Eliquis 5 mg p.o. twice daily not resumed on admission as patient is on heparin gtt. and n.p.o.  Chart reviewed.   Complete echo [facility] on 03/09/2020: Estimated ejection fraction is 50%, mild LVH concentric, mild MR, trivial PR, trivial TR, no valvular stenosis.  DVT prophylaxis: Heparin per pharmacy Code Status: DNR Diet: N.p.o. Family Communication: Stated spouse, [name] (legal HPOA) and daughter, Lattie Haw (who is a Designer, jewellery). [name] and Lattie Haw are in agreement and [name] prefers to defer to [place] regarding medical course of  action Disposition Plan: poor prognosis Consults called: pharmacy Admission status: Inpatient, stepdown  Past Medical History:  Diagnosis Date   A-fib ([place])    Diabetes mellitus without complication ([place])    Hypertension    Past Surgical History:  Procedure Laterality Date   arm surgery     BACK SURGERY     JOINT REPLACEMENT     hip   JOINT REPLACEMENT Right    knee   SPINE SURGERY     Social History:  reports that he quit smoking about 5 years ago. His smoking use included cigars. He has never used smokeless tobacco. He reports that he does not drink alcohol and does not use drugs.  Allergies  Allergen Reactions   Haloperidol Other (See Comments)    Catatonic    Doxycycline Swelling   Lovastatin Swelling and Other (See Comments)    Oral swelling, not requiring ER, no problem with Lipitor   Family History  Problem Relation Age of Onset   Heart disease Mother    Heart disease Father    Heart disease Sister    Leukemia Brother    Heart disease Brother    Diabetes Brother    Family history: Family history reviewed and not pertinent  Prior to Admission medications   Medication Sig Start Date End Date Taking? Authorizing Provider  acetaminophen (TYLENOL) 325 MG tablet Take 2 tablets (650 mg total) by mouth every 6 (six) hours as needed for mild pain, fever or headache. 01/22/21  Yes Nicole Kindred A, DO  amLODipine (NORVASC) 5 MG tablet Take 1 tablet (5 mg total) by mouth daily. 04/21/21  Yes Lavina Hamman, MD  apixaban (ELIQUIS) 5 MG TABS tablet Take 1 tablet (5 mg total) by mouth 2 (two) times daily. 01/22/21  Yes Nicole Kindred A, DO  atorvastatin (LIPITOR) 80 MG tablet Take 80 mg by mouth daily. 03/15/21  Yes [provider]  bisacodyl (DULCOLAX) 10 MG suppository Place 1 suppository (10 mg total) rectally daily as needed for severe constipation. 01/22/21  Yes Nicole Kindred A, DO  bisacodyl (DULCOLAX) 5 MG EC tablet Take 1 tablet (5 mg total) by mouth daily  as needed for moderate constipation. 01/22/21  Yes Nicole Kindred A, DO  cholecalciferol (VITAMIN D3) 25 MCG (1000 UNIT) tablet Take 1,000 Units by mouth daily.   Yes [provider]  famotidine (PEPCID) 20 MG tablet Take 1 tablet (20 mg total) by mouth daily. 04/21/21  Yes Lavina Hamman, MD  hydrocerin (EUCERIN) CREA Apply 1 application topically 2 (two) times daily. 01/22/21  Yes Nicole Kindred A, DO  ketoconazole (NIZORAL) 2 % shampoo Apply 1 application topically 2 (two) times a week.   Yes [provider]  levETIRAcetam (KEPPRA) 1000 MG tablet 1,000 mg 2 (two) times daily. 03/10/20  Yes [provider]  lidocaine (LIDODERM) 5 % Place 1 patch onto the skin daily. Remove & Discard patch within 12 hours or as directed by MD 04/21/21  Yes Lavina Hamman, MD  metFORMIN (GLUCOPHAGE) 1000 MG tablet Take 1,000 mg by mouth 2 (two) times daily. 03/15/21  Yes [provider]  metoprolol tartrate (LOPRESSOR) 25 MG tablet Take 0.5 tablets (12.5 mg total) by mouth 2 (two) times daily. 04/20/21  Yes Lavina Hamman, MD  Multiple Vitamin (MULTIVITAMIN WITH MINERALS) TABS tablet Take 1 tablet by mouth daily. 01/23/21  Yes Nicole Kindred A, DO  nystatin cream (MYCOSTATIN) Apply 1 Application topically 2 (two) times daily. 09/16/22  Yes [provider]  polyethylene glycol (MIRALAX / GLYCOLAX) 17 g packet Take 17 g by mouth daily. 01/23/21  Yes Nicole Kindred A, DO  QUEtiapine (SEROQUEL) 25 MG tablet Take 1 tablet (25 mg total) by mouth 2 (two) times daily as needed (agitation). 01/22/21  Yes Nicole Kindred A, DO  QUEtiapine (SEROQUEL) 50 MG tablet Take 1 tablet (50 mg total) by mouth at bedtime. 01/22/21  Yes Nicole Kindred A, DO  ramipril (ALTACE) 10 MG capsule Take 10 mg by mouth 2 (two) times daily.   Yes [provider]  rivastigmine (EXELON) 1.5 MG capsule Take 1 capsule (1.5 mg total) by mouth 2 (two) times daily. 01/22/21  Yes Nicole Kindred A, DO   senna-docusate (SENOKOT-S) 8.6-50 MG tablet Take 1 tablet by mouth 2 (two) times daily. 01/22/21  Yes Nicole Kindred A, DO  torsemide (DEMADEX) 20 MG tablet Take 20 mg by mouth daily.   Yes [provider]  triamcinolone cream (KENALOG) 0.1 % Apply topically 2 (two) times daily. 01/22/21  Yes Nicole Kindred A, DO  feeding supplement (ENSURE ENLIVE / ENSURE PLUS) LIQD Take 237 mLs by mouth 2 (two) times daily between meals. 01/23/21   Ezekiel Slocumb, DO  furosemide (LASIX) 20 MG tablet Take 1 tablet (20 mg total) by mouth daily. Patient not taking: Reported on 12/17/2022 04/20/21   Lavina Hamman, MD  HYDROcodone-acetaminophen (NORCO/VICODIN) 5-325 MG tablet Take 1 tablet by mouth every 6 (six) hours as needed for severe pain. Patient not taking: Reported on 12/16/2022 04/20/21   Lavina Hamman, MD  Omega-3 Fatty Acids (FISH OIL) 1000 MG CAPS Take 1,000 mg by mouth in the morning and at bedtime. Patient not taking: Reported on 12/16/2022    [provider]    Physical Exam: Vitals:   12/04/2022 1845 12/19/2022 1900 12/12/2022 2000 12/05/2022 2016  BP: (!) 99/57 (!) 78/55 (!) 85/56 (!) 99/57  Pulse: (!) 47 64 60 72  Resp: (!) 26 (!) 26 (!) 24 (!) 25  Temp:      TempSrc:      SpO2: 97% 100% 100% 100%  Weight:      Height:       Constitutional: appears age-appropriate, frail, acutely ill, NAD, calm, comfortable Eyes: PERRL, lids and conjunctivae normal ENMT: Mucous membranes are dry. Posterior pharynx clear of any exudate or lesions. Age-appropriate dentition.  Unable to assess hearing as patient is altered. Neck: normal, supple, no masses, no thyromegaly Respiratory:  no wheezing, no crackles. Normal respiratory effort. No accessory muscle use.  BiPAP mask in place with machinelike breath sounds. Cardiovascular: Regular rate and rhythm, no murmurs / rubs / gallops. No extremity edema. 2+ pedal pulses. No carotid bruits.  Abdomen: Obese abdomen, no tenderness, no masses palpated,  no hepatosplenomegaly. Bowel sounds positive.  Musculoskeletal: no clubbing / cyanosis. No joint deformity upper and lower extremities.  Unable to assess full range of motion of extremities..  Skin: no rashes, lesions, ulcers. No induration Neurologic: Sensation intact. Strength 5/5 in all 4.  Psychiatric: Unable to assess judgment and insight. Not alert and oriented.  Unable to assess mood.   EKG: independently reviewed, showing initial EKG showing atrial fibrillation with rate of 99, QTc 471  Chest x-ray on Admission: I  personally reviewed and I agree with radiologist reading as below.  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 Diffuse bilateral predominantly interstitial opacities, most pronounced in the perihilar and bibasilar regions. No pleural effusion or pneumothorax. Prior multilevel thoracic fusion. IMPRESSION: Diffuse bilateral interstitial opacities, most pronounced in the perihilar and bibasilar regions. Findings may reflect pulmonary edema versus multifocal atypical/viral infection. Electronically Signed   By: Davina Poke D.O.   On: 12/15/2022 09:56    Labs on Admission: I have personally reviewed following labs CBC: Recent Labs  Lab 12/30/2022 0957  WBC 8.9  NEUTROABS 7.9*  HGB 11.0*  HCT 35.7*  MCV 102.6*  PLT 123456   Basic Metabolic Panel: Recent Labs  Lab 12/21/2022 0957  NA 141  K 4.2  CL 102  CO2 24  GLUCOSE 158*  BUN 30*  CREATININE 2.10*  CALCIUM 9.7  MG 1.1*   GFR: Estimated Creatinine Clearance: 32.8 mL/min (A) (by C-G formula based on SCr of 2.1 mg/dL (H)).  Liver Function Tests: Recent Labs  Lab 12/25/2022 0957  AST 40  ALT 10  ALKPHOS 59  BILITOT 1.3*  PROT 7.5  ALBUMIN 3.4*   Coagulation Profile: Recent Labs  Lab 12/23/2022 0957  INR 2.8*   Urine analysis:    Component Value Date/Time    COLORURINE YELLOW (A) 12/02/2022 0932   APPEARANCEUR CLEAR (A) 12/16/2022 0932   LABSPEC 1.015 12/05/2022 0932   PHURINE 5.0 12/04/2022 0932   GLUCOSEU NEGATIVE 12/07/2022 0932   HGBUR NEGATIVE 12/25/2022 0932   BILIRUBINUR NEGATIVE 12/02/2022 0932   KETONESUR NEGATIVE 12/10/2022 0932   PROTEINUR NEGATIVE 12/14/2022 0932   NITRITE NEGATIVE 12/07/2022 0932   LEUKOCYTESUR NEGATIVE 12/09/2022 0932   CRITICAL CARE Performed by: Dr. Tobie Poet  Total critical care time: 120 minutes  Critical care time was exclusive of separately billable procedures and treating other patients.  Critical care was necessary to treat or prevent imminent or life-threatening deterioration.  Critical care was time spent personally by me on the following activities: development of treatment plan with patient and/or surrogate as well as nursing, discussions with consultants, evaluation of patient's response to treatment, examination of patient, obtaining history from patient or surrogate, ordering and performing treatments and interventions, ordering and review of laboratory studies, ordering and review of radiographic studies, pulse oximetry and re-evaluation of patient's condition.  This document was prepared using Dragon Voice Recognition software and may include unintentional dictation errors.  Dr. Tobie Poet [facility]  If 7PM-7AM, please contact overnight-coverage provider If 7AM-7PM, please contact day coverage provider [email or web address]  12/02/2022, 8:25 PM

## 2022-12-20 NOTE — Consult Note (Signed)
CODE SEPSIS - PHARMACY COMMUNICATION  **Broad Spectrum Antibiotics should be administered within 1 hour of Sepsis diagnosis**  Time Code Sepsis Called/Page Received: 1046  Antibiotics Ordered: ceftraixone + azithromycin  Time of 1st antibiotic administration: 1116  Additional action taken by pharmacy: None      Oswald Hillock ,PharmD Clinical Pharmacist  12/02/2022  11:34 AM

## 2022-12-20 NOTE — ED Provider Notes (Signed)
[facility] Provider Note    Event Date/Time   First MD Initiated Contact with Patient 01/01/2023 [phone]     (approximate)   History   Weakness and Shortness of Breath   HPI  Ian King is a 82 y.o. male past medical history significant for dementia, atrial fibrillation on Eliquis, hypertension, hyperlipidemia, prior CVA, CAD, who presents to the emergency department with not feeling well and hypoxia.  History is provided by EMS.  Patient comes from home and history is provided by the patient's wife and EMS.  Stated that the patient has not been feeling well for the past couple of days with cough and congestion.  When EMS arrived he was 73% on room air, placed on 6 L nasal cannula.  Given a DuoNeb treatment and IV Solu-Medrol 125 mg.  Patient complaining of burning with urination.  Febrile on arrival.  Patient's wife is at home and coming into the emergency department.     Physical Exam    Most recent vital signs: Vitals:   12/13/2022 1245 12/06/2022 1300  BP: (!) 87/55 (!) 83/63  Pulse: 90 85  Resp: (!) 22 (!) 36  Temp:    SpO2: 97% 96%    Physical Exam Constitutional:      General: He is in acute distress.     Appearance: He is well-developed. He is ill-appearing.  HENT:     Head: Atraumatic.  Eyes:     Conjunctiva/sclera: Conjunctivae normal.  Cardiovascular:     Rate and Rhythm: Tachycardia present.  Pulmonary:     Effort: Respiratory distress present.     Breath sounds: Rhonchi and rales present.  Abdominal:     Palpations: Abdomen is soft.  Musculoskeletal:     Cervical back: Normal range of motion.     Right lower leg: No edema.     Left lower leg: No edema.     Comments: No unilateral leg swelling  Skin:    General: Skin is warm.     Capillary Refill: Capillary refill takes 2 to 3 seconds.  Neurological:     Mental Status: He is alert. Mental status is at baseline.     IMPRESSION / MDM / ASSESSMENT AND PLAN / ED COURSE  I  reviewed the triage vital signs and the nursing notes.  Differential diagnosis including sepsis, pneumonia, heart failure exacerbation, urinary tract infection, electrolyte abnormality, dehydration  After discussion with the patient's wife at bedside confirmed that the patient is DNR and DNI.  Would be okay with IV pressors.    EKG  I, [name], the attending physician, personally viewed and interpreted this ECG.   Rate: Normal  Rhythm: Normal sinus  Axis: Normal  Intervals: QT 671  ST&T Change: nonspecific ST depression  Atrial fibrillation with tachycardia while on cardiac telemetry.  RADIOLOGY I independently reviewed imaging, my interpretation of imaging: Chest x-ray with pulmonary edema versus multifocal pneumonia.  LABS (all labs ordered are listed, but only abnormal results are displayed) Labs interpreted as -    Labs Reviewed  LACTIC ACID, PLASMA - Abnormal; Notable for the following components:      Result Value   Lactic Acid, Venous 6.4 (*)    All other components within normal limits  COMPREHENSIVE METABOLIC PANEL - Abnormal; Notable for the following components:   Glucose, Bld 158 (*)    BUN 30 (*)    Creatinine, Ser 2.10 (*)    Albumin 3.4 (*)    Total Bilirubin  1.3 (*)    GFR, Estimated 31 (*)    All other components within normal limits  CBC WITH DIFFERENTIAL/PLATELET - Abnormal; Notable for the following components:   RBC 3.48 (*)    Hemoglobin 11.0 (*)    HCT 35.7 (*)    MCV 102.6 (*)    Neutro Abs 7.9 (*)    Lymphs Abs 0.5 (*)    All other components within normal limits  PROTIME-INR - Abnormal; Notable for the following components:   Prothrombin Time 29.3 (*)    INR 2.8 (*)    All other components within normal limits  URINALYSIS, W/ REFLEX TO CULTURE (INFECTION SUSPECTED) - Abnormal; Notable for the following components:   Color, Urine YELLOW (*)    APPearance CLEAR (*)    All other components within normal limits  BRAIN NATRIURETIC  PEPTIDE - Abnormal; Notable for the following components:   B Natriuretic Peptide 646.5 (*)    All other components within normal limits  BLOOD GAS, VENOUS - Abnormal; Notable for the following components:   pH, Ven 7.24 (*)    pCO2, Ven 62 (*)    Acid-base deficit 2.1 (*)    All other components within normal limits  MAGNESIUM - Abnormal; Notable for the following components:   Magnesium 1.1 (*)    All other components within normal limits  TROPONIN I (HIGH SENSITIVITY) - Abnormal; Notable for the following components:   Troponin I (High Sensitivity) 4,767 (*)    All other components within normal limits  RESP PANEL BY RT-PCR (RSV, FLU A&B, COVID)  RVPGX2  CULTURE, BLOOD (ROUTINE X 2)  CULTURE, BLOOD (ROUTINE X 2)  MRSA NEXT GEN BY PCR, NASAL  APTT  LACTIC ACID, PLASMA  PROCALCITONIN  TROPONIN I (HIGH SENSITIVITY)    MDM  On arrival to the emergency department concern for possible sepsis given fever, tachycardia and hypoxia.  Blood cultures obtained.  Patient started on IV antibiotics with IV Rocephin and azithromycin.  Felt that 30 cc/kg of IV fluids may be detrimental to the patient given his findings concerning for volume overload, will give a 250 mL bolus and reevaluate  1:12 PM -on sepsis reevaluation continued to have poor perfusion, low blood pressure, will give a 500 bolus and reevaluate.  Patient is stable on BiPAP.  QT prolonged on EKG.  Discussed with the hospitalist, patient already received IV azithromycin.  Given IV magnesium 2 g.  Troponin significantly elevated most likely demand ischemia in the setting of infectious process.  Elevated BNP.  Acute kidney injury.  Hypercarbic on VBG and significantly elevated lactic acid of 6.2.  Patient is DNR/DNI.  Patient's daughter at bedside and updated on the critical nature of the patient.  Consulted hospitalist and discussed the patient's case with Dr. Tobie Poet, currently has a MAP greater than 65, will admit to stepdown  unit.       PROCEDURES:  Critical Care performed: yes  .Critical Care  Performed by: [name], MD Authorized by: [name], MD   Critical care provider statement:    Critical care time (minutes):  65   Critical care time was exclusive of:  Separately billable procedures and treating other patients   Critical care was necessary to treat or prevent imminent or life-threatening deterioration of the following conditions:  Sepsis and circulatory failure   Critical care was time spent personally by me on the following activities:  Development of treatment plan with patient or surrogate, discussions with consultants, evaluation of patient's response  to treatment, examination of patient, ordering and review of laboratory studies, ordering and review of radiographic studies, ordering and performing treatments and interventions, pulse oximetry, re-evaluation of patient's condition and review of old charts   Patient's presentation is most consistent with acute presentation with potential threat to life or bodily function.   MEDICATIONS ORDERED IN ED: Medications  magnesium sulfate IVPB 2 g 50 mL (2 g Intravenous New Bag/Given 12/21/2022 1235)  lactated ringers infusion (150 mL/hr Intravenous New Bag/Given 12/08/2022 1310)  acetaminophen (TYLENOL) tablet 650 mg (has no administration in time range)    Or  acetaminophen (TYLENOL) suppository 650 mg (has no administration in time range)  ondansetron (ZOFRAN) tablet 4 mg (has no administration in time range)    Or  ondansetron (ZOFRAN) injection 4 mg (has no administration in time range)  lactated ringers bolus 500 mL (has no administration in time range)  cefTRIAXone (ROCEPHIN) 2 g in sodium chloride 0.9 % 100 mL IVPB (has no administration in time range)  senna-docusate (Senokot-S) tablet 1 tablet (has no administration in time range)  midodrine (PROAMATINE) tablet 10 mg (has no administration in time range)  acetaminophen (TYLENOL)  suppository 650 mg (650 mg Rectal Given 12/30/2022 1117)  sodium chloride 0.9 % bolus 250 mL (0 mLs Intravenous Stopped 12/10/2022 1159)  aspirin suppository 300 mg (300 mg Rectal Given 12/26/2022 1206)  sodium chloride 0.9 % bolus 250 mL (0 mLs Intravenous Stopped 12/03/2022 1256)  sodium chloride 0.9 % bolus 500 mL (0 mLs Intravenous Stopped 12/16/2022 1256)    FINAL CLINICAL IMPRESSION(S) / ED DIAGNOSES   Final diagnoses:  Hypoxia  Sepsis with acute organ dysfunction and septic shock, due to unspecified organism, unspecified organ dysfunction type ([place])  Elevated troponin  Prolonged QT interval     Rx / DC Orders   ED Discharge Orders     None        Note:  This document was prepared using Dragon voice recognition software and may include unintentional dictation errors.   [name], MD 12/29/2022 1313

## 2022-12-20 NOTE — Assessment & Plan Note (Signed)
Troponin peaked at 8992 >> 6579 today (3/20)  Doubt ACS, pt denies chest pain. Demand Ischemia - due to septic shock  --Treated with empiric IV heparin  --Echo to assess wall motion - report is pending --No further labs

## 2022-12-20 NOTE — ED Triage Notes (Signed)
Pt presents to the ED via [facility] from home due to SOB and weakness. Pt is usually ambulatory and has been bed bound since yesterday. Per medic pt was febrile at home was given 1000mg  of tylenol by wife this morning. Per medic pt's O2 on RA 70's-80's. Pt has productive cough. Pt is drowsy and currently on 6L [place].

## 2022-12-20 NOTE — Progress Notes (Signed)
Care team actively in room during initial visit. Chaplain will leave information for a follow up in morning.

## 2022-12-20 NOTE — Assessment & Plan Note (Signed)
Stop Lipitor 

## 2022-12-20 NOTE — Progress Notes (Signed)
RT called to pt room for transport to ICU. MD in speaking with family. Transport on hold for approx 40 min.

## 2022-12-20 NOTE — Hospital Course (Signed)
Mr. Ian King is a 82 year old male with history of chronic atrial fibrillation, hypertension, seizure disorder, B12 deficiency, hyperlipidemia, Lewy body dementia with behavioral disturbances, CAD, non-insulin-dependent diabetes mellitus who presents emergency department for chief concerns of shortness of breath and weakness with productive cough.  Vitals in the ED showed temperature of 101.9, respiration rate of 41, blood pressure 116/57, heart rate of 94, SpO2 of 88% on 6 L nasal cannula.  Patient was placed on BiPAP and is tolerating.  VBG showed 7.2 4/62/45.  Serum sodium is 141, potassium 4.2, chloride 102, bicarb 24, BUN of 30, serum creatinine of 2.50, EGFR 31, nonfasting blood glucose 158, WBC 8.9, hemoglobin 11, platelets of 154.  BNP was elevated at 646.5.  High sensitive troponin elevated at 4767.  Lactic acid elevated at 6.4.  COVID/influenza A/influenza B/RSV PCR were negative.  UA was negative for leukocytes and nitrates.  Portable chest x-ray: Was read as diffuse bilateral interstitial opacity, most pronounced in the perihilar and bibasilar regions.  Findings may reflect pulmonary edema versus multifocal atypical viral infection.  ED treatment: Acetaminophen 650 mg p.o. one-time dose, aspirin 300, azithromycin 500 mg IV, ceftriaxone 2 g IV.

## 2022-12-20 NOTE — Sepsis Progress Note (Signed)
Notified provider of need to order fluid bolus.  ?

## 2022-12-20 NOTE — Assessment & Plan Note (Signed)
Status post magnesium 2 g IV one-time dose ordered Recheck magnesium level at 20:20-hour Check magnesium level in the a.m.

## 2022-12-21 ENCOUNTER — Inpatient Hospital Stay: Payer: Medicare PPO

## 2022-12-21 ENCOUNTER — Inpatient Hospital Stay
Admit: 2022-12-21 | Discharge: 2022-12-21 | Disposition: A | Discharge status: Home / Self Care | Payer: Medicare PPO | Attending: Internal Medicine | Admitting: Internal Medicine

## 2022-12-21 DIAGNOSIS — R6521 Severe sepsis with septic shock: Secondary | ICD-10-CM

## 2022-12-21 DIAGNOSIS — R7989 Other specified abnormal findings of blood chemistry: Secondary | ICD-10-CM | POA: Diagnosis not present

## 2022-12-21 DIAGNOSIS — N179 Acute kidney failure, unspecified: Secondary | ICD-10-CM

## 2022-12-21 DIAGNOSIS — B953 Streptococcus pneumoniae as the cause of diseases classified elsewhere: Secondary | ICD-10-CM | POA: Diagnosis not present

## 2022-12-21 DIAGNOSIS — Z7189 Other specified counseling: Secondary | ICD-10-CM

## 2022-12-21 DIAGNOSIS — R9431 Abnormal electrocardiogram [ECG] [EKG]: Secondary | ICD-10-CM

## 2022-12-21 DIAGNOSIS — R7881 Bacteremia: Secondary | ICD-10-CM

## 2022-12-21 DIAGNOSIS — A419 Sepsis, unspecified organism: Secondary | ICD-10-CM | POA: Diagnosis not present

## 2022-12-21 LAB — GLUCOSE, CAPILLARY
Glucose-Capillary: 124 mg/dL — ABNORMAL HIGH (ref 70–99)
Glucose-Capillary: 109 mg/dL — ABNORMAL HIGH (ref 70–99)
Glucose-Capillary: 93 mg/dL (ref 70–99)
Glucose-Capillary: 81 mg/dL (ref 70–99)

## 2022-12-21 LAB — BLOOD CULTURE ID PANEL (REFLEXED) - BCID2

## 2022-12-21 LAB — CBC
HCT: 31.6 % — ABNORMAL LOW (ref 39.0–52.0)
Hemoglobin: 9.7 g/dL — ABNORMAL LOW (ref 13.0–17.0)
MCH: 31.1 pg (ref 26.0–34.0)
MCHC: 30.7 g/dL (ref 30.0–36.0)
MCV: 101.3 fL — ABNORMAL HIGH (ref 80.0–100.0)
Platelets: 126 10*3/uL — ABNORMAL LOW (ref 150–400)
RBC: 3.12 MIL/uL — ABNORMAL LOW (ref 4.22–5.81)
RDW: 13.5 % (ref 11.5–15.5)
WBC: 10.9 10*3/uL — ABNORMAL HIGH (ref 4.0–10.5)
nRBC: 0 % (ref 0.0–0.2)

## 2022-12-21 LAB — BASIC METABOLIC PANEL
Anion gap: 9 (ref 5–15)
BUN: 45 mg/dL — ABNORMAL HIGH (ref 8–23)
CO2: 24 mmol/L (ref 22–32)
Calcium: 9.1 mg/dL (ref 8.9–10.3)
Chloride: 105 mmol/L (ref 98–111)
Creatinine, Ser: 2.22 mg/dL — ABNORMAL HIGH (ref 0.61–1.24)
GFR, Estimated: 29 mL/min — ABNORMAL LOW (ref >60)
Glucose, Bld: 125 mg/dL — ABNORMAL HIGH (ref 70–99)
Potassium: 4.4 mmol/L (ref 3.5–5.1)
Sodium: 138 mmol/L (ref 135–145)

## 2022-12-21 LAB — PROTIME-INR
INR: 2.6 — ABNORMAL HIGH (ref 0.8–1.2)
Prothrombin Time: 27.3 seconds — ABNORMAL HIGH (ref 11.4–15.2)

## 2022-12-21 LAB — HEPARIN LEVEL (UNFRACTIONATED): Heparin Unfractionated: >1.1 IU/mL — ABNORMAL HIGH (ref 0.30–0.70)

## 2022-12-21 LAB — MAGNESIUM: Magnesium: 1.9 mg/dL (ref 1.7–2.4)

## 2022-12-21 LAB — APTT
aPTT: 105 seconds — ABNORMAL HIGH (ref 24–36)
aPTT: 87 seconds — ABNORMAL HIGH (ref 24–36)

## 2022-12-21 LAB — BRAIN NATRIURETIC PEPTIDE: B Natriuretic Peptide: 385.4 pg/mL — ABNORMAL HIGH (ref 0.0–100.0)

## 2022-12-21 LAB — C DIFFICILE QUICK SCREEN W PCR REFLEX
C Diff antigen: NEGATIVE
C Diff interpretation: NOT DETECTED
C Diff toxin: NEGATIVE

## 2022-12-21 LAB — LACTIC ACID, PLASMA: Lactic Acid, Venous: 3.7 mmol/L (ref 0.5–1.9)

## 2022-12-21 LAB — PROCALCITONIN: Procalcitonin: 11.43 ng/mL

## 2022-12-21 LAB — TROPONIN I (HIGH SENSITIVITY): Troponin I (High Sensitivity): 6579 ng/L (ref <18)

## 2022-12-21 MED ORDER — MAGNESIUM SULFATE 2 GM/50ML IV SOLN
2.0000 g | Freq: Once | INTRAVENOUS | Status: AC
  Administered 2022-12-21: 2 g via INTRAVENOUS
  Filled 2022-12-21: qty 50

## 2022-12-21 MED ORDER — LACTATED RINGERS IV BOLUS
500.0000 mL | Freq: Once | INTRAVENOUS | Status: AC
  Administered 2022-12-21: 500 mL via INTRAVENOUS

## 2022-12-21 MED ORDER — MAGNESIUM SULFATE 2 GM/50ML IV SOLN
2.0000 g | Freq: Once | INTRAVENOUS | Status: DC
  Filled 2022-12-21: qty 50

## 2022-12-21 MED ORDER — CHLORHEXIDINE GLUCONATE CLOTH 2 % EX PADS
6.0000 | MEDICATED_PAD | Freq: Every day | CUTANEOUS | Status: DC
  Administered 2022-12-21 – 2022-12-22 (×2): 6 via TOPICAL

## 2022-12-21 MED ORDER — LACTATED RINGERS IV SOLN: INTRAVENOUS | Status: DC

## 2022-12-21 NOTE — Consult Note (Signed)
ANTICOAGULATION CONSULT NOTE   Pharmacy Consult for heparin Indication: chest pain/ACS  Allergies  Allergen Reactions   Haloperidol Other (See Comments)    Catatonic    Doxycycline Swelling   Lovastatin Swelling and Other (See Comments)    Oral swelling, not requiring ER, no problem with Lipitor    Patient Measurements: Height: 6' (182.9 cm) Weight: 91.5 kg (201 lb 11.5 oz) IBW/kg (Calculated) : 77.6 Heparin Dosing Weight: 97 kg  Vital Signs: Temp: 98.3 F (36.8 C) (03/20 1200) Temp Source: Oral (03/20 1200) BP: 102/58 (03/20 2000) Pulse Rate: 81 (03/20 2000)  Labs: Recent Labs    12/08/2022 0957 12/30/2022 1236 12/05/2022 1521 12/12/2022 1715 12/11/2022 2127 12/21/22 0911 12/21/22 2041  HGB 11.0*  --   --   --   --  9.7*  --   HCT 35.7*  --   --   --   --  31.6*  --   PLT 154  --   --   --   --  126*  --   APTT 35  --   --   --  194* 105* 87*  LABPROT 29.3*  --   --   --   --  27.3*  --   INR 2.8*  --   --   --   --  2.6*  --   HEPARINUNFRC >1.10*  --   --   --   --  >1.10*  --   CREATININE 2.10*  --   --   --   --  2.22*  --   TROPONINIHS 4,767*   < > 7,934* 8,992*  --  6,579*  --    < > = values in this interval not displayed.     Estimated Creatinine Clearance: 28.2 mL/min (A) (by C-G formula based on SCr of 2.22 mg/dL (H)).   Medical History: Past Medical History:  Diagnosis Date   A-fib ([place])    Diabetes mellitus without complication ([place])    Hypertension     Medications:  PTA: Apixaban 5mg  BID (last dose 3/18 at 2000) Inpatient: heparin infusion (3/19 >>) Allergies: No AC/APT related allergies   Assessment: Pt is an 31 YOM presenting with concerns of NSTEMI. Troponin is elevated at 4.8k. PMH includes Afib, stroke, HTN, dementia, HLD, CAD. Last dose of apixaban was last night (12/19/2022 PM). INR is elevated at 2.8, patient is not on warfarin and no signs of cirrhosis based on labs. Unsure of reason why INR is elevated. CBC currently stable. No note of  bleeding. Since patient was on apixaban prior to admission, predict anti-Xa level will be falsely elevated. Will monitor with aPTTs.  Date Time aPTT/HL Rate/Comment 3/20 0911 105 / >1.10 Therapeutic x1 / 850 u/hr  3/20 2041 87 / ---  Therapueic x2 / 850 u/hr  Goal of Therapy:  aPTT 66-102 seconds Monitor platelets by anticoagulation protocol: Yes   Plan:  Continue heparin infusion at 850 units/hr Check aPTT/Anti-Xa level tomorrow morning with AM labs Titrate by aPTT's until lab correlation is noted, then titrate by anti-xa alone. Continue to monitor H&H and platelets daily while on heparin gtt.  Darrick Penna Clinical Pharmacist 12/21/2022 9:18 PM

## 2022-12-21 NOTE — Consult Note (Signed)
ANTICOAGULATION CONSULT NOTE   Pharmacy Consult for heparin Indication: chest pain/ACS  Allergies  Allergen Reactions   Haloperidol Other (See Comments)    Catatonic    Doxycycline Swelling   Lovastatin Swelling and Other (See Comments)    Oral swelling, not requiring ER, no problem with Lipitor    Patient Measurements: Height: 6' (182.9 cm) Weight: 91.5 kg (201 lb 11.5 oz) IBW/kg (Calculated) : 77.6 Heparin Dosing Weight: 97 kg  Vital Signs: Temp: 98.5 F (36.9 C) (03/20 0800) Temp Source: Oral (03/20 0800) BP: 110/58 (03/20 0800) Pulse Rate: 74 (03/20 0800)  Labs: Recent Labs    12/18/2022 0957 12/26/2022 1236 12/03/2022 1521 12/17/2022 1715 12/28/2022 2127 12/21/22 0911  HGB 11.0*  --   --   --   --  9.7*  HCT 35.7*  --   --   --   --  31.6*  PLT 154  --   --   --   --  126*  APTT 35  --   --   --  194* 105*  LABPROT 29.3*  --   --   --   --  27.3*  INR 2.8*  --   --   --   --  2.6*  HEPARINUNFRC >1.10*  --   --   --   --  >1.10*  CREATININE 2.10*  --   --   --   --  2.22*  TROPONINIHS 4,767*   < > 7,934* 8,992*  --  6,579*   < > = values in this interval not displayed.     Estimated Creatinine Clearance: 28.2 mL/min (A) (by C-G formula based on SCr of 2.22 mg/dL (H)).   Medical History: Past Medical History:  Diagnosis Date   A-fib ([place])    Diabetes mellitus without complication ([place])    Hypertension     Medications:  Medications Prior to Admission  Medication Sig Dispense Refill Last Dose   acetaminophen (TYLENOL) 325 MG tablet Take 2 tablets (650 mg total) by mouth every 6 (six) hours as needed for mild pain, fever or headache.   12/30/2022 at 0400   amLODipine (NORVASC) 5 MG tablet Take 1 tablet (5 mg total) by mouth daily. 30 tablet 0 12/19/2022   apixaban (ELIQUIS) 5 MG TABS tablet Take 1 tablet (5 mg total) by mouth 2 (two) times daily. 60 tablet  12/19/2022 at 2000   atorvastatin (LIPITOR) 80 MG tablet Take 80 mg by mouth daily.   12/19/2022 at 2000    bisacodyl (DULCOLAX) 10 MG suppository Place 1 suppository (10 mg total) rectally daily as needed for severe constipation. 12 suppository 0 prn at unk   bisacodyl (DULCOLAX) 5 MG EC tablet Take 1 tablet (5 mg total) by mouth daily as needed for moderate constipation. 30 tablet 0 prn at unk   cholecalciferol (VITAMIN D3) 25 MCG (1000 UNIT) tablet Take 1,000 Units by mouth daily.   12/19/2022   famotidine (PEPCID) 20 MG tablet Take 1 tablet (20 mg total) by mouth daily. 30 tablet 0 prn at unk   hydrocerin (EUCERIN) CREA Apply 1 application topically 2 (two) times daily.  0 prn at unk   ketoconazole (NIZORAL) 2 % shampoo Apply 1 application topically 2 (two) times a week.   prn at unk   levETIRAcetam (KEPPRA) 1000 MG tablet 1,000 mg 2 (two) times daily.   12/19/2022 at 0800   lidocaine (LIDODERM) 5 % Place 1 patch onto the skin daily. Remove & Discard patch within  12 hours or as directed by MD 30 patch 0 prn at unk   metFORMIN (GLUCOPHAGE) 1000 MG tablet Take 1,000 mg by mouth 2 (two) times daily.   12/19/2022 at 0800   metoprolol tartrate (LOPRESSOR) 25 MG tablet Take 0.5 tablets (12.5 mg total) by mouth 2 (two) times daily. 30 tablet 0 12/19/2022 at 0800   Multiple Vitamin (MULTIVITAMIN WITH MINERALS) TABS tablet Take 1 tablet by mouth daily.   12/19/2022 at 0800   nystatin cream (MYCOSTATIN) Apply 1 Application topically 2 (two) times daily.   prn at unk   polyethylene glycol (MIRALAX / GLYCOLAX) 17 g packet Take 17 g by mouth daily. 14 each 0 prn at unk   QUEtiapine (SEROQUEL) 25 MG tablet Take 1 tablet (25 mg total) by mouth 2 (two) times daily as needed (agitation).   12/19/2022   QUEtiapine (SEROQUEL) 50 MG tablet Take 1 tablet (50 mg total) by mouth at bedtime.   12/19/2022   ramipril (ALTACE) 10 MG capsule Take 10 mg by mouth 2 (two) times daily.   12/19/2022 at 2000   rivastigmine (EXELON) 1.5 MG capsule Take 1 capsule (1.5 mg total) by mouth 2 (two) times daily.   12/19/2022 at 0800   senna-docusate  (SENOKOT-S) 8.6-50 MG tablet Take 1 tablet by mouth 2 (two) times daily.   prn at unk   torsemide (DEMADEX) 20 MG tablet Take 20 mg by mouth daily.   12/19/2022 at 0800   triamcinolone cream (KENALOG) 0.1 % Apply topically 2 (two) times daily. 30 g 0 prn at unk   feeding supplement (ENSURE ENLIVE / ENSURE PLUS) LIQD Take 237 mLs by mouth 2 (two) times daily between meals. 237 mL 12    Scheduled:   Chlorhexidine Gluconate Cloth  6 each Topical Daily   insulin aspart  0-5 Units Subcutaneous QHS   insulin aspart  0-9 Units Subcutaneous TID WC   metoprolol tartrate  12.5 mg Oral BID   mouth rinse  15 mL Mouth Rinse 4 times per day   Infusions:   sodium chloride Stopped (12/21/22 0954)   cefTRIAXone (ROCEPHIN)  IV 200 mL/hr at 12/21/22 1000   heparin 900 Units/hr (12/21/22 1000)   lactated ringers     lactated ringers     norepinephrine (LEVOPHED) Adult infusion Stopped (12/21/22 0906)   valproate sodium Stopped (12/25/2022 2320)   PRN: acetaminophen **OR** acetaminophen, LORazepam, ondansetron **OR** ondansetron (ZOFRAN) IV, mouth rinse, senna-docusate Anti-infectives (From admission, onward)    Start     Dose/Rate Route Frequency Ordered Stop   12/21/22 1200  azithromycin (ZITHROMAX) 500 mg in sodium chloride 0.9 % 250 mL IVPB  Status:  Discontinued        500 mg 250 mL/hr over 60 Minutes Intravenous Every 24 hours 12/13/2022 1349 12/21/22 1052   12/21/22 1000  cefTRIAXone (ROCEPHIN) 2 g in sodium chloride 0.9 % 100 mL IVPB        2 g 200 mL/hr over 30 Minutes Intravenous Every 24 hours 12/21/2022 1246 12/30/22 0959   12/21/22 1000  azithromycin (ZITHROMAX) 500 mg in sodium chloride 0.9 % 250 mL IVPB  Status:  Discontinued        500 mg 250 mL/hr over 60 Minutes Intravenous Every 24 hours 12/25/2022 1246 12/27/2022 1247   12/19/2022 1045  cefTRIAXone (ROCEPHIN) 2 g in sodium chloride 0.9 % 100 mL IVPB  Status:  Discontinued        2 g 200 mL/hr over 30 Minutes Intravenous Every 24 hours  12/30/2022  1040 12/15/2022 1300   12/11/2022 1045  azithromycin (ZITHROMAX) 500 mg in sodium chloride 0.9 % 250 mL IVPB  Status:  Discontinued        500 mg 250 mL/hr over 60 Minutes Intravenous Every 24 hours 12/30/2022 1040 12/21/2022 1247       Assessment: Pt is an 35 YOM presenting with concerns of NSTEMI. Troponin is elevated at 4.8k. PMH includes Afib, stroke, HTN, dementia, HLD, CAD. Last dose of apixaban was last night (12/19/2022 PM). INR is elevated at 2.8, patient is not on warfarin and no signs of cirrhosis based on labs. Unsure of reason why INR is elevated. CBC currently stable. No note of bleeding. Since patient was on apixaban prior to admission, predict anti-Xa level will be falsely elevated. Will monitor with aPTTs.   Goal of Therapy:  aPTT 66-102 seconds Monitor platelets by anticoagulation protocol: Yes   Plan: aPTT only slightly supratherapeutic following recent rate reduction ---reduce heparin infusion rate slightly to 850 units/hr ---recheck aPTT level in 8 hours after rate change (Switch to heparin level monitoring once aPTT and heparin level correlate)  ---Daily heparin level and CBC while on heparin.  Vallery Sa, PharmD, BCPS 12/21/2022 11:05 AM

## 2022-12-21 NOTE — Assessment & Plan Note (Signed)
Presented in septic shock, weaning off Levophed this AM.  Resumed on metoprolol. Monitor BP closely. Maintain MAP>65.

## 2022-12-21 NOTE — Progress Notes (Signed)
PHARMACY - PHYSICIAN COMMUNICATION CRITICAL VALUE ALERT - BLOOD CULTURE IDENTIFICATION (BCID)  Results for orders placed or performed during the hospital encounter of 12/06/2022  Blood Culture (routine x 2)     Status: None (Preliminary result)   Collection Time: 12/07/2022  9:57 AM   Specimen: BLOOD  Result Value Ref Range Status   Specimen Description BLOOD LEFT ANTECUBITAL  Final   Special Requests   Final    BOTTLES DRAWN AEROBIC AND ANAEROBIC Blood Culture adequate volume   Culture  Setup Time   Final    GRAM POSITIVE COCCI ANAEROBIC BOTTLE ONLY CRITICAL VALUE NOTED.  VALUE IS CONSISTENT WITH PREVIOUSLY REPORTED AND CALLED VALUE. Performed at [facility], [address] [place], [place] [address]    Culture [facility] POSITIVE COCCI  Final   Report Status PENDING  Incomplete  Blood Culture (routine x 2)     Status: None (Preliminary result)   Collection Time: 01/01/2023  9:57 AM   Specimen: BLOOD  Result Value Ref Range Status   Specimen Description BLOOD BLOOD RIGHT FOREARM  Final   Special Requests   Final    BOTTLES DRAWN AEROBIC AND ANAEROBIC Blood Culture adequate volume   Culture  Setup Time   Final    Organism ID to follow IN BOTH AEROBIC AND ANAEROBIC BOTTLES GRAM POSITIVE COCCI CRITICAL RESULT CALLED TO, READ BACK BY AND VERIFIED WITH: Sevana Grandinetti PHARMD @0321  12/21/22 ASW Performed at [facility], [address] [place], [place] [address]    Culture GRAM POSITIVE COCCI  Final   Report Status PENDING  Incomplete  Blood Culture ID Panel (Reflexed)     Status: Abnormal   Collection Time: 12/06/2022  9:57 AM  Result Value Ref Range Status   Enterococcus faecalis NOT DETECTED NOT DETECTED Final   Enterococcus Faecium NOT DETECTED NOT DETECTED Final   Listeria monocytogenes NOT DETECTED NOT DETECTED Final   Staphylococcus species NOT DETECTED NOT DETECTED Final   Staphylococcus aureus (BCID) NOT DETECTED NOT DETECTED Final   Staphylococcus epidermidis  NOT DETECTED NOT DETECTED Final   Staphylococcus lugdunensis NOT DETECTED NOT DETECTED Final   Streptococcus species DETECTED (A) NOT DETECTED Final    Comment: CRITICAL RESULT CALLED TO, READ BACK BY AND VERIFIED WITH: Lidiya Reise PHARMD @0321  12/21/22 ASW    Streptococcus agalactiae NOT DETECTED NOT DETECTED Final   Streptococcus pneumoniae DETECTED (A) NOT DETECTED Final    Comment: CRITICAL RESULT CALLED TO, READ BACK BY AND VERIFIED WITH: Casimir Barcellos PHARMD @0321  12/21/22 ASW    Streptococcus pyogenes NOT DETECTED NOT DETECTED Final   A.calcoaceticus-baumannii NOT DETECTED NOT DETECTED Final   Bacteroides fragilis NOT DETECTED NOT DETECTED Final   Enterobacterales NOT DETECTED NOT DETECTED Final   Enterobacter cloacae complex NOT DETECTED NOT DETECTED Final   Escherichia coli NOT DETECTED NOT DETECTED Final   Klebsiella aerogenes NOT DETECTED NOT DETECTED Final   Klebsiella oxytoca NOT DETECTED NOT DETECTED Final   Klebsiella pneumoniae NOT DETECTED NOT DETECTED Final   Proteus species NOT DETECTED NOT DETECTED Final   Salmonella species NOT DETECTED NOT DETECTED Final   Serratia marcescens NOT DETECTED NOT DETECTED Final   Haemophilus influenzae NOT DETECTED NOT DETECTED Final   Neisseria meningitidis NOT DETECTED NOT DETECTED Final   Pseudomonas aeruginosa NOT DETECTED NOT DETECTED Final   Stenotrophomonas maltophilia NOT DETECTED NOT DETECTED Final   Candida albicans NOT DETECTED NOT DETECTED Final   Candida auris NOT DETECTED NOT DETECTED Final   Candida glabrata NOT  DETECTED NOT DETECTED Final   Candida krusei NOT DETECTED NOT DETECTED Final   Candida parapsilosis NOT DETECTED NOT DETECTED Final   Candida tropicalis NOT DETECTED NOT DETECTED Final   Cryptococcus neoformans/gattii NOT DETECTED NOT DETECTED Final    Comment: Performed at [facility], [address] [place], [place] [address]  Resp panel by RT-PCR (RSV, Flu A&B, Covid) Anterior Nasal Swab      Status: None   Collection Time: 12/06/2022 10:00 AM   Specimen: Anterior Nasal Swab  Result Value Ref Range Status   SARS Coronavirus 2 by RT PCR NEGATIVE NEGATIVE Final    Comment: (NOTE) SARS-CoV-2 target nucleic acids are NOT DETECTED.  The SARS-CoV-2 RNA is generally detectable in upper respiratory specimens during the acute phase of infection. The lowest concentration of SARS-CoV-2 viral copies this assay can detect is 138 copies/mL. A negative result does not preclude SARS-Cov-2 infection and should not be used as the sole basis for treatment or other patient management decisions. A negative result may occur with  improper specimen collection/handling, submission of specimen other than nasopharyngeal swab, presence of viral mutation(s) within the areas targeted by this assay, and inadequate number of viral copies(<138 copies/mL). A negative result must be combined with clinical observations, patient history, and epidemiological information. The expected result is Negative.  Fact Sheet for Patients:  EntrepreneurPulse.com.au  Fact Sheet for Healthcare Providers:  IncredibleEmployment.be  This test is no t yet approved or cleared by the [place] FDA and  has been authorized for detection and/or diagnosis of SARS-CoV-2 by FDA under an Emergency Use Authorization (EUA). This EUA will remain  in effect (meaning this test can be used) for the duration of the COVID-19 declaration under Section 564(b)(1) of the Act, 21 U.S.C.section 360bbb-3(b)(1), unless the authorization is terminated  or revoked sooner.       Influenza A by PCR NEGATIVE NEGATIVE Final   Influenza B by PCR NEGATIVE NEGATIVE Final    Comment: (NOTE) The Xpert Xpress SARS-CoV-2/FLU/RSV plus assay is intended as an aid in the diagnosis of influenza from Nasopharyngeal swab specimens and should not be used as a sole basis for treatment. Nasal washings and aspirates are  unacceptable for Xpert Xpress SARS-CoV-2/FLU/RSV testing.  Fact Sheet for Patients: EntrepreneurPulse.com.au  Fact Sheet for Healthcare Providers: IncredibleEmployment.be  This test is not yet approved or cleared by the [place] FDA and has been authorized for detection and/or diagnosis of SARS-CoV-2 by FDA under an Emergency Use Authorization (EUA). This EUA will remain in effect (meaning this test can be used) for the duration of the COVID-19 declaration under Section 564(b)(1) of the Act, 21 U.S.C. section 360bbb-3(b)(1), unless the authorization is terminated or revoked.     Resp Syncytial Virus by PCR NEGATIVE NEGATIVE Final    Comment: (NOTE) Fact Sheet for Patients: EntrepreneurPulse.com.au  Fact Sheet for Healthcare Providers: IncredibleEmployment.be  This test is not yet approved or cleared by the [place] FDA and has been authorized for detection and/or diagnosis of SARS-CoV-2 by FDA under an Emergency Use Authorization (EUA). This EUA will remain in effect (meaning this test can be used) for the duration of the COVID-19 declaration under Section 564(b)(1) of the Act, 21 U.S.C. section 360bbb-3(b)(1), unless the authorization is terminated or revoked.  Performed at [facility], [address] [place], [place] [address]   MRSA Next Gen by PCR, Nasal     Status: None   Collection Time: 12/24/2022  3:47 PM   Specimen: Nasal  Mucosa; Nasal Swab  Result Value Ref Range Status   MRSA by PCR Next Gen NOT DETECTED NOT DETECTED Final    Comment: (NOTE) The GeneXpert MRSA Assay (FDA approved for NASAL specimens only), is one component of a comprehensive MRSA colonization surveillance program. It is not intended to diagnose MRSA infection nor to guide or monitor treatment for MRSA infections. Test performance is not FDA approved in patients less than 25 years old. Performed at  [facility], [place], [place] [address]   Respiratory (~20 pathogens) panel by PCR     Status: None   Collection Time: 12/23/2022  3:47 PM   Specimen: Nasopharyngeal Swab; Respiratory  Result Value Ref Range Status   Adenovirus NOT DETECTED NOT DETECTED Final   Coronavirus 229E NOT DETECTED NOT DETECTED Final    Comment: (NOTE) The Coronavirus on the Respiratory Panel, DOES NOT test for the novel  Coronavirus (2019 nCoV)    Coronavirus HKU1 NOT DETECTED NOT DETECTED Final   Coronavirus NL63 NOT DETECTED NOT DETECTED Final   Coronavirus OC43 NOT DETECTED NOT DETECTED Final   Metapneumovirus NOT DETECTED NOT DETECTED Final   Rhinovirus / Enterovirus NOT DETECTED NOT DETECTED Final   Influenza A NOT DETECTED NOT DETECTED Final   Influenza B NOT DETECTED NOT DETECTED Final   Parainfluenza Virus 1 NOT DETECTED NOT DETECTED Final   Parainfluenza Virus 2 NOT DETECTED NOT DETECTED Final   Parainfluenza Virus 3 NOT DETECTED NOT DETECTED Final   Parainfluenza Virus 4 NOT DETECTED NOT DETECTED Final   Respiratory Syncytial Virus NOT DETECTED NOT DETECTED Final   Bordetella pertussis NOT DETECTED NOT DETECTED Final   Bordetella Parapertussis NOT DETECTED NOT DETECTED Final   Chlamydophila pneumoniae NOT DETECTED NOT DETECTED Final   Mycoplasma pneumoniae NOT DETECTED NOT DETECTED Final    Comment: Performed at [facility], [place]. [address] [place], [place] [address]    BCID Results: 3 of 4 bottles w/ Strep pneumoniae, no resistance detected.  Pt currently on Azithromycin and Ceftriaxone.  Name of provider contacted: Morton Amy, NP   Changes to prescribed antibiotics required: No changes at this time.  Renda Rolls, PharmD, [facility] 12/21/2022 4:38 AM

## 2022-12-21 NOTE — Progress Notes (Signed)
Progress Note   Patient: Ian King [id number] DOB: 1941-07-11 DOA: 12/29/2022     1 DOS: the patient was seen and examined on 12/21/2022   Brief hospital course: Mr. Ian King is a 82 year old male with history of chronic atrial fibrillation, hypertension, seizure disorder, B12 deficiency, hyperlipidemia, Lewy body dementia with behavioral disturbances, CAD, non-insulin-dependent diabetes mellitus who presented to the ED on 12/19/2022 for evaluation of worsening shortness of breath and weakness with productive cough.  Vitals in the ED showed temperature of 101.9, respiration rate of 41, blood pressure 116/57, heart rate of 94, SpO2 of 88% on 6 L/min [place] O2.  He was placed on BiPAP in the ED. VBG showed 7.2 4/62/45. Labs were most notable for creatinine of 2.50 (baseline around 0.8), WBC 8.9.  BNP elevated 646.5.  hs-troponin elevated at 4767 and trended upward. Lactic acid elevated at 6.4. COVID/influenza A/influenza B/RSV PCR were negative. UA was negative for leukocytes and nitrates.  Chest x-ray was concerning for pneumonia vs pulmonary edema (read as diffuse bilateral interstitial opacity, most pronounced in the perihilar and bibasilar regions.  Findings may reflect pulmonary edema versus multifocal atypical viral infection).  Patient was started on IV antibiotics, IV heparin due to elevated troponin, and IV fluids per sepsis protocol.  BP's initially responded to fluids, but he later required Levophed for BP support in stepdown unit.  Goals of care were discussed on admission.  Family agreeable to vasopressors as needed, but confirm DNR / DNI status and would not want any invasive measures.  3/20 - weaning off Levophed this AM.  On 4 L/min  O2.  Minimal urine output despite sepsis fluids.  Troponin peaked and down-trending. BNP improved.   Lactic acid still 3.7, getting more IV fluids.  Echo ordered to assess wall motion.  Remains on heparin.   Assessment and Plan: * Septic  shock ([place]) With hypotension refractory to fluids, required Levophed, and multiple organ dysfunction (cardiac, renal, respiratory, encephalopathy). Suspect infection source is multifocal pneumonia, possibly aspiration. --Continue IV antibiotics --Follow cultures --Maintain MAP > 65, wean Levophed --Supportive care, O2 per protocol   AKI (acute kidney injury) ([place]) Oliguria Suspect due to poor p.o. intake, and septic shock, multifactorial.   Cr on admission 2.10 (baseline 0.79). Given IV fluids per sepsis protocol. 3/20: Cr 2.22 this AM --Continue IV fluids including extra 500 cc bolus given persistent lactic acidosis today --Strict I's and O's --Repeat BMP in a.m. --Bladder scans PRN  Elevated troponin Troponin peaked at 8992 >> 6579 today (3/20)  Doubt ACS, pt denies chest pain. Demand Ischemia - due to septic shock  --Continue IV heparin per pharmacy x 48 hrs --Follow pending Echo to assess wall motion    Prolonged QT interval Presumed secondary to septic shock and hypomagnesemia. --Serial EKG's to monitor --Keppra held on admission, resume asap once Qtc improves. --Ativan IV PRN seizure activity --Maintain K>4, Mg>2  Hypomagnesemia Mg 1.6 on admission, replaced. Mg 1.9 this AM --Monitor & replace Mg PRN  AMS (altered mental status) Due to infection / septic shock, superimposed on baseline dementia.  3/20 - mental status at baseline per family --Mgmt of underlying issues as outlined. --Delirium precautions  Seizure disorder ([place]) Per family at bedside, patient has not had a seizure in over 40 years --Holding home Keppra 1000 mg twice daily due to prolonged QT --Curbside discussed alternative with neurology who recommends initial loading dose with valproic acid 20 mg/kg followed by 500 mg IV 3 times daily --When patient is  ready to restart Keppra, the Keppra dosing will need to be renally dose unless GFR improves.  Alternatively can justify stopping Keppra long-term  and continue valproic acid indefinitely as his new anticonvulsant given the event with prolonged QT  Chronic atrial fibrillation (HCC) Holding Eliquis while on heparin gtt. Telemetry monitoring.  CAD (coronary artery disease) Stable, no chest pain.  Suspect demand ischemia explains elevated troponin.   --Continue empiric IV heparin --Continue metoprolol  Bilateral edema of lower extremity Present on admission, likely due to venous insufficiency.   Monitor with giving sepsis fluids.  BNP was elevated, but trended down today despite getting sepsis fluids. --Elevate legs  Chronic venous insufficiency Noted. Monitor. Elevate legs.  Ankylosing spondylitis (HCC) Supportive care, pain control PRN.  Hyperlipidemia Continue Lipitor  Pressure injury of skin I agree with the wound description as outlined below.  Continue diligent wound care, frequent repositioning and offloading pressure areas. Monitor for signs of infection.  Optimize nutrition.  Pressure Injury 12/07/2022 Buttocks Medial Stage 1 -  Intact skin with non-blanchable redness of a localized area usually over a bony prominence. (Active)  12/19/2022 1700  Location: Buttocks  Location Orientation: Medial  Staging: Stage 1 -  Intact skin with non-blanchable redness of a localized area usually over a bony prominence.  Wound Description (Comments):   Present on Admission: Yes     Pressure Injury 12/14/2022 Buttocks Medial Deep Tissue Pressure Injury - Purple or maroon localized area of discolored intact skin or blood-filled blister due to damage of underlying soft tissue from pressure and/or shear. (Active)  12/25/2022 1700  Location: Buttocks  Location Orientation: Medial  Staging: Deep Tissue Pressure Injury - Purple or maroon localized area of discolored intact skin or blood-filled blister due to damage of underlying soft tissue from pressure and/or shear.  Wound Description (Comments):   Present on Admission: Yes    Respiratory  acidosis Gated by patient with obstructive sleep apnea, does not wear CPAP machine and severe sepsis presumed secondary to community-acquired/atypical pneumonia  Severe sepsis (HCC) With hypotension and multiple organ dysfunction: Cardiac, renal, respiratory, encephalopathy Etiology workup in progress Blood cultures x 2 are in process, check procalcitonin, continue to follow lactic acid, MRSA PCR, 20 pathogen respiratory panel ordered Continue ceftriaxone 2 g IV daily, 4 additional doses to complete 5-day course Azithromycin was discontinued due to new onset QT prolongation, doxycycline 100 mg IV twice daily were considered however patient has reported history of oral swelling with doxycycline Pharmacy consulted for antibiotic therapy for atypical pneumonia coverage Continue BiPAP therapy Admit to stepdown, inpatient  HTN (hypertension) Presented in septic shock, weaning off Levophed this AM.  Resumed on metoprolol. Monitor BP closely. Maintain MAP>65.        Subjective: Pt seen with family at bedside this AM.  He is awake and talking and family report mental status at baseline.  They report a chronic wet sounding cough but deny seeing him choke when eating or drinking.  Nurse reports having to use yaunker to suction secretions he is too weak to cough up, very frequently.  Pt denies having chest pain.     Physical Exam: Vitals:   12/21/22 1200 12/21/22 1300 12/21/22 1400 12/21/22 1500  BP: (!) 103/58 (!) 100/59 (!) 95/54 106/68  Pulse: 75 69 64 61  Resp: 16 17 18  (!) 27  Temp: 98.3 F (36.8 C)     TempSrc: Oral     SpO2: 97% 94% 95% 96%  Weight:      Height:  General exam: awake, alert, no acute distress HEENT: nasal cannula in place, moist mucus membranes, hearing grossly normal  Respiratory system: coarse rhonchi throughout, diminished bases due to poor inspirations, no expiratory wheezes,  normal respiratory effort at rest, on 4 L/min [place] O2. Cardiovascular system:  normal S1/S2, tachycardic regular rhythm, nonpitting lower extremity edema.   Gastrointestinal system: soft, NT, ND, no HSM felt, +bowel sounds. Central nervous system: A&O x self hospital. no gross focal neurologic deficits, normal speech Extremities: moves all, no edema, normal tone Skin: dry, intact, normal temperature Psychiatry: normal mood, flat affect, abnormal judgement and insight due to dementia, minimally conversant   Data Reviewed:  Notable labs --- Cr 2.10 >> 2.22,  troponin peaked  8992 >> 6579, repeat Lactic acid still 3.7, WBC 10.9, Hbg 9.7, platelets 126k, INR 2.6, BNP improved 385.4,   Renal ultrasound -- no hydronephrosis or bladder distention.   Exophytic, cystic lesion at the inferior aspect of the left kidney, which measures up to 3.6 cm.  Echocardiogram --- pending   Family Communication: at bedside on rounds  Disposition: Status is: Inpatient Remains inpatient appropriate because: Severity of illness as outlined & ongoing evaluation   Planned Discharge Destination: Home    Time spent: 56 minutes including time at bedside and in coordination of care  Author: Ezekiel Slocumb, DO 12/21/2022 4:24 PM  For on call review [email or web address].

## 2022-12-21 NOTE — Progress Notes (Signed)
Dr. Arbutus Ped notified of patient not voiding on previous shift and nothing on this shift.

## 2022-12-21 NOTE — Assessment & Plan Note (Addendum)
Present on admission, likely due to venous insufficiency.   Monitor with giving sepsis fluids.  BNP was elevated, but trended down today despite getting sepsis fluids. --Elevate legs

## 2022-12-21 NOTE — Consult Note (Signed)
NAME: Ian King  DOB: 04/09/1941  MRN: [id number]  Date/Time: 12/21/2022 12:23 PM  REQUESTING PROVIDER: Dr.Griffith Subjective:  REASON FOR CONSULT: Pneumococcus bacteremia No history available from patient, chart reviewed, spoke to his wife Ian King is a 82 y.o. male with a history of DM, Afib, CVA, CAD s/p CABG , HLD, HTN, DM,Left THA. TKA Rt, posterior thoracic spine fusion with arthrodesis, dementia presented to the ED on 12/16/2022 with shortness of breath and productive cough and weakness x2 days- He had a temp on Monday and as he was doing poorly he was brought to the ED Vitals in the ED   12/08/2022 09:45  BP 103/50 !  Temp 101.9 F (38.8 C) !  Pulse Rate 102 !  Resp 41 (H)  SpO2 94 %  O2 Flow Rate (L/min) 6 L/min    Latest Reference Range & Units 12/09/2022 09:57  WBC 4.0 - 10.5 K/uL 8.9  Hemoglobin 13.0 - 17.0 g/dL 11.0 (L)  HCT 39.0 - 52.0 % 35.7 (L)  Platelets 150 - 400 K/uL 154  Creatinine 0.61 - 1.24 mg/dL 2.10 (H)   Blood culture sent CXR b/l infiltrate concerning for CHF EKG Afib with  prolonged QT    Component Value Date/Time   BNP 385.4 (H) 12/21/2022 0911   As per wife he has limited mobility- He is mostly in bed or chair Past Medical History:  Diagnosis Date   A-fib ([place])    Diabetes mellitus without complication ([place])    Hypertension     Past Surgical History:  Procedure Laterality Date   arm surgery     BACK SURGERY     JOINT REPLACEMENT     hip   JOINT REPLACEMENT Right    knee   SPINE SURGERY      Social History   Socioeconomic History   Marital status: Married    Spouse name: Not on file   Number of children: Not on file   Years of education: Not on file   Highest education level: Not on file  Occupational History   Not on file  Tobacco Use   Smoking status: Former    Types: Cigars    Quit date: 01/16/2017    Years since quitting: 5.9   Smokeless tobacco: Never  Substance and Sexual Activity   Alcohol use: Never   Drug use:  Never   Sexual activity: Not on file  Other Topics Concern   Not on file  Social History Narrative   Not on file   Social Determinants of Health   Financial Resource Strain: Not on file  Food Insecurity: No Food Insecurity (12/05/2022)   Hunger Vital Sign    Worried About Running Out of Food in the Last Year: Never true    Ran Out of Food in the Last Year: Never true  Transportation Needs: No Transportation Needs (12/15/2022)   PRAPARE - Hydrologist (Medical): No    Lack of Transportation (Non-Medical): No  Physical Activity: Not on file  Stress: Not on file  Social Connections: Not on file  Intimate Partner Violence: Patient Unable To Answer (12/10/2022)   Humiliation, Afraid, Rape, and Kick questionnaire    Fear of Current or Ex-Partner: Patient unable to answer    Emotionally Abused: Patient unable to answer    Physically Abused: Patient unable to answer    Sexually Abused: Patient unable to answer    Family History  Problem Relation Age of Onset  Heart disease Mother    Heart disease Father    Heart disease Sister    Leukemia Brother    Heart disease Brother    Diabetes Brother    Allergies  Allergen Reactions   Haloperidol Other (See Comments)    Catatonic    Doxycycline Swelling   Lovastatin Swelling and Other (See Comments)    Oral swelling, not requiring ER, no problem with Lipitor   I? Current Facility-Administered Medications  Medication Dose Route Frequency Provider Last Rate Last Admin   0.9 %  sodium chloride infusion  250 mL Intravenous Continuous Cox, Amy N, DO   Stopped at 12/21/22 [id number]   acetaminophen (TYLENOL) tablet 650 mg  650 mg Oral Q6H PRN Cox, Amy N, DO       Or   acetaminophen (TYLENOL) suppository 650 mg  650 mg Rectal Q6H PRN Cox, Amy N, DO       cefTRIAXone (ROCEPHIN) 2 g in sodium chloride 0.9 % 100 mL IVPB  2 g Intravenous Q24H Berton Mount, RPH 200 mL/hr at 12/21/22 1000 Infusion Verify at 12/21/22 1000    Chlorhexidine Gluconate Cloth 2 % PADS 6 each  6 each Topical Daily Nicole Kindred A, DO   6 each at 12/21/22 0954   heparin ADULT infusion 100 units/mL (25000 units/263mL)  850 Units/hr Intravenous Continuous Dallie Piles, RPH 9 mL/hr at 12/21/22 1000 900 Units/hr at 12/21/22 1000   insulin aspart (novoLOG) injection 0-5 Units  0-5 Units Subcutaneous QHS Cox, Amy N, DO       insulin aspart (novoLOG) injection 0-9 Units  0-9 Units Subcutaneous TID WC Cox, Amy N, DO   1 Units at 12/13/2022 1733   lactated ringers infusion   Intravenous Continuous Nicole Kindred A, DO 100 mL/hr at 12/21/22 1110 New Bag at 12/21/22 1110   LORazepam (ATIVAN) injection 2 mg  2 mg Intravenous PRN Cox, Amy N, DO       metoprolol tartrate (LOPRESSOR) tablet 12.5 mg  12.5 mg Oral BID Cox, Amy N, DO       norepinephrine (LEVOPHED) 4mg  in 248mL (0.016 mg/mL) premix infusion  2-10 mcg/min Intravenous Titrated Cox, Amy N, DO   Stopped at 12/21/22 0906   ondansetron (ZOFRAN) tablet 4 mg  4 mg Oral Q6H PRN Cox, Amy N, DO       Or   ondansetron (ZOFRAN) injection 4 mg  4 mg Intravenous Q6H PRN Cox, Amy N, DO       Oral care mouth rinse  15 mL Mouth Rinse 4 times per day Cox, Amy N, DO   15 mL at 12/21/22 0840   Oral care mouth rinse  15 mL Mouth Rinse PRN Cox, Amy N, DO       senna-docusate (Senokot-S) tablet 1 tablet  1 tablet Oral QHS PRN Cox, Amy N, DO       valproate (DEPACON) 500 mg in dextrose 5 % 50 mL IVPB  500 mg Intravenous TID Cox, Amy N, DO   Stopped at 12/21/2022 2320     Abtx:  Anti-infectives (From admission, onward)    Start     Dose/Rate Route Frequency Ordered Stop   12/21/22 1200  azithromycin (ZITHROMAX) 500 mg in sodium chloride 0.9 % 250 mL IVPB  Status:  Discontinued        500 mg 250 mL/hr over 60 Minutes Intravenous Every 24 hours 12/21/2022 1349 12/21/22 1052   12/21/22 1000  cefTRIAXone (ROCEPHIN) 2 g in sodium chloride 0.9 %  100 mL IVPB        2 g 200 mL/hr over 30 Minutes Intravenous Every 24  hours 12/24/2022 1246 12/30/22 0959   12/21/22 1000  azithromycin (ZITHROMAX) 500 mg in sodium chloride 0.9 % 250 mL IVPB  Status:  Discontinued        500 mg 250 mL/hr over 60 Minutes Intravenous Every 24 hours 12/17/2022 1246 12/29/2022 1247   12/15/2022 1045  cefTRIAXone (ROCEPHIN) 2 g in sodium chloride 0.9 % 100 mL IVPB  Status:  Discontinued        2 g 200 mL/hr over 30 Minutes Intravenous Every 24 hours 01/01/2023 1040 12/19/2022 1300   12/12/2022 1045  azithromycin (ZITHROMAX) 500 mg in sodium chloride 0.9 % 250 mL IVPB  Status:  Discontinued        500 mg 250 mL/hr over 60 Minutes Intravenous Every 24 hours 12/03/2022 1040 12/10/2022 1247       REVIEW OF SYSTEMS:  Unable to get review of system as pt is weak, sleepy and undergoing 2 d echo ? Pertinent Positives include : Objective:  VITALS:  BP (!) 110/58   Pulse 74   Temp 98.5 F (36.9 C) (Oral)   Resp (!) 23   Ht 6' (1.829 m)   Wt 91.5 kg   SpO2 96%   BMI 27.36 kg/m   PHYSICAL EXAM:  General: in bed sleeping He wakes up easily and says he is doing alright. No distress Head: Normocephalic, without obvious abnormality, atraumatic. Eyes: Conjunctivae clear, anicteric sclerae. Pupils are equal ENT Nares normal. No drainage or sinus tenderness. Lips, mucosa, and tongue normal. No Thrush Neck: , symmetrical, no adenopathy, thyroid: non tender no carotid bruit and no JVD. Lungs: b/l air entry- basal crepts Heart: irregular, rate controlled Abdomen: Soft, non-tender,not distended. Bowel sounds normal. No masses Extremities: edema legs + Lymph: Cervical, supraclavicular normal. Neurologic: Grossly non-focal Pertinent Labs Lab Results    Latest Ref Rng & Units 12/21/2022    9:11 AM 12/16/2022    9:57 AM 04/18/2021    4:14 AM  CBC  WBC 4.0 - 10.5 K/uL 10.9  8.9  6.8   Hemoglobin 13.0 - 17.0 g/dL 9.7  11.0  12.3   Hematocrit 39.0 - 52.0 % 31.6  35.7  37.1   Platelets 150 - 400 K/uL 126  154  147        Latest Ref Rng & Units  12/21/2022    9:11 AM 12/09/2022    9:57 AM 04/18/2021    4:14 AM  CMP  Glucose 70 - 99 mg/dL 125  158  145   BUN 8 - 23 mg/dL 45  30  19   Creatinine 0.61 - 1.24 mg/dL 2.22  2.10  0.79   Sodium 135 - 145 mmol/L 138  141  141   Potassium 3.5 - 5.1 mmol/L 4.4  4.2  3.7   Chloride 98 - 111 mmol/L 105  102  107   CO2 22 - 32 mmol/L 24  24  28    Calcium 8.9 - 10.3 mg/dL 9.1  9.7  9.7   Total Protein 6.5 - 8.1 g/dL  7.5    Total Bilirubin 0.3 - 1.2 mg/dL  1.3    Alkaline Phos 38 - 126 U/L  59    AST 15 - 41 U/L  40    ALT 0 - 44 U/L  10      Lactic Acid, Venous    Component Value Date/Time   LATICACIDVEN  3.7 ([place]) 12/21/2022 0911   .PRO Microbiology BC from 3/19- pneumococcus  Recent Labs    12/21/22 0911  PROCALCITON 11.43    IMAGING RESULTS:  B/l perihilar infiltrates and lower lobes interstitial infiltrate- more in favor of CHF I have personally reviewed the films ? Impression/Recommendation ?Pt has bacteremia due to  pneumococcus , on ceftriaxone- source could be lung but no clear lobar consolidation, rather diffuse infiltrate Need ECHO ( it is being done now) Need repeat blood culture RESP Viral PCR neg   Hypotension due to sepsis- has resolved- off pressors  AKI- could be due to sepsis and also CHF  Afib- controlled Prolonged QT - not on macrolides  CHF CAD S/P CABG [facility] spine B/l knee replacements  ___________________________________________________ Discussed with wife at bed side and his daughter on the phone Note:  This document was prepared using Dragon voice recognition software and may include unintentional dictation errors.

## 2022-12-21 NOTE — Progress Notes (Signed)
  Chaplain On-Call responded to Spiritual Care Consult Order from [place], DO.  The request was for prayer with the patient.  Chaplain met the patient; his wife Curt Bears; and Industrial/product designer.  Chaplain offered spiritual and emotional support, and provided prayer for comfort and peace.  Chaplain Remo Lipps., [facility]

## 2022-12-21 NOTE — Consult Note (Signed)
PHARMACY CONSULT NOTE - FOLLOW UP  Pharmacy Consult for Electrolyte Monitoring and Replacement   Recent Labs: Potassium (mmol/L)  Date Value  12/23/2022 4.2   Magnesium (mg/dL)  Date Value  12/15/2022 1.6 (L)   Calcium (mg/dL)  Date Value  12/19/2022 9.7   Albumin (g/dL)  Date Value  12/17/2022 3.4 (L)   Phosphorus (mg/dL)  Date Value  01/17/2021 2.8   Sodium (mmol/L)  Date Value  12/21/2022 141     Assessment: 82 yo M with PMH Afib on Eliquis, stroke, HTN, dementia, HLD, CAD presenting with concerns of NSTEMI. Troponin is elevated at 4.8k.  Goal of Therapy:  K >/= 4.0 and Mg >/= 2.0  Plan:  Magnesium sulfate 2 g IV x 1 Will follow up electrolytes with AM labs  Delena Bali ,PharmD Clinical Pharmacist 12/21/2022 7:35 AM

## 2022-12-21 NOTE — Assessment & Plan Note (Signed)
Noted. Monitor. Elevate legs.

## 2022-12-21 NOTE — Assessment & Plan Note (Signed)
I agree with the wound description as outlined below.  Continue diligent wound care, frequent repositioning and offloading pressure areas. Monitor for signs of infection.  Optimize nutrition.  Pressure Injury 12/10/2022 Buttocks Medial Stage 1 -  Intact skin with non-blanchable redness of a localized area usually over a bony prominence. (Active)  12/02/2022 1700  Location: Buttocks  Location Orientation: Medial  Staging: Stage 1 -  Intact skin with non-blanchable redness of a localized area usually over a bony prominence.  Wound Description (Comments):   Present on Admission: Yes     Pressure Injury 12/30/2022 Buttocks Medial Deep Tissue Pressure Injury - Purple or maroon localized area of discolored intact skin or blood-filled blister due to damage of underlying soft tissue from pressure and/or shear. (Active)  12/21/2022 1700  Location: Buttocks  Location Orientation: Medial  Staging: Deep Tissue Pressure Injury - Purple or maroon localized area of discolored intact skin or blood-filled blister due to damage of underlying soft tissue from pressure and/or shear.  Wound Description (Comments):   Present on Admission: Yes

## 2022-12-21 NOTE — Assessment & Plan Note (Signed)
Supportive care, pain control PRN.

## 2022-12-21 NOTE — Progress Notes (Signed)
*  PRELIMINARY RESULTS* Echocardiogram 2D Echocardiogram has been performed.  Sherrie Sport 12/21/2022, 2:38 PM

## 2022-12-21 NOTE — Assessment & Plan Note (Addendum)
With hypotension refractory to fluids, required Levophed, and multiple organ dysfunction (cardiac, renal, respiratory, encephalopathy). Suspect infection source is multifocal pneumonia, possibly aspiration. --Stop IV antibiotics --Supportive care, O2 PRN for comfort

## 2022-12-21 NOTE — Consult Note (Signed)
Consultation Note Date: 12/21/2022   Patient Name: KIERIAN HOGGAN  DOB: [birth year]  MRN: [id number]  Age / Sex: 82 y.o., male  PCP: Idelle Crouch, MD Referring Physician: Ezekiel Slocumb, DO  Reason for Consultation: Establishing goals of care  HPI/Patient Profile: Mr. Gerrick Bok is a 82 year old male with history of chronic atrial fibrillation, hypertension, seizure disorder, B12 deficiency, hyperlipidemia, Lewy body dementia with behavioral disturbances, CAD, non-insulin-dependent diabetes mellitus who presents emergency department for chief concerns of shortness of breath and weakness with productive cough.    Clinical Assessment and Goals of Care:   Notes and labs reviewed. In to see patient.  Patient is resting in bed staring at the ceiling.  He clears his throat and coughs weakly intermittently.  His wife is at bedside.  His wife states they have been married for 45 years.  She states they have a child together and he has a daughter from a previous marriage who is a Designer, jewellery at the [place].  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  Wife states he was diagnosed with dementia around 3 years ago.  She states he previously was under hospice care, but hospice care was stopped as he outlived the timeframe.  Wife has been updated on his status and events.  She is considering care moving forward.  Discussed meeting tomorrow at 12 to talk further.  SUMMARY OF RECOMMENDATIONS   PMT will follow  Prognosis:  < 6 months      Primary Diagnoses: Present on Admission:  Ankylosing spondylitis ([place])  AMS (altered mental status)  Bilateral edema of lower extremity  CAD (coronary artery disease)  Chronic atrial fibrillation ([facility])  Chronic venous insufficiency  Hyperlipidemia  HTN  (hypertension)  Prolonged QT interval  Elevated troponin  AKI (acute kidney injury) ([place])  Respiratory acidosis  Pressure injury of skin  Septic shock ([place])   I have reviewed the medical record, interviewed the patient and family, and examined the patient. The following aspects are pertinent.  Past Medical History:  Diagnosis Date   A-fib ([place])    Diabetes mellitus without complication ([place])    Hypertension    Social History   Socioeconomic History   Marital status: Married    Spouse name: Not on file   Number of children: Not on file   Years of education: Not on file   Highest education level: Not on file  Occupational History   Not on file  Tobacco Use   Smoking status: Former    Types: Cigars    Quit date: 01/16/2017    Years since quitting: 5.9   Smokeless tobacco: Never  Substance and Sexual Activity   Alcohol use: Never   Drug use: Never   Sexual activity: Not on file  Other Topics Concern   Not on file  Social History Narrative   Not on file   Social Determinants of Health  Financial Resource Strain: Not on file  Food Insecurity: No Food Insecurity (12/12/2022)   Hunger Vital Sign    Worried About Running Out of Food in the Last Year: Never true    Ran Out of Food in the Last Year: Never true  Transportation Needs: No Transportation Needs (12/09/2022)   PRAPARE - Hydrologist (Medical): No    Lack of Transportation (Non-Medical): No  Physical Activity: Not on file  Stress: Not on file  Social Connections: Not on file   Family History  Problem Relation Age of Onset   Heart disease Mother    Heart disease Father    Heart disease Sister    Leukemia Brother    Heart disease Brother    Diabetes Brother    Scheduled Meds:  Chlorhexidine Gluconate Cloth  6 each Topical Daily   insulin aspart  0-5 Units Subcutaneous QHS   insulin aspart  0-9 Units Subcutaneous TID WC   metoprolol tartrate  12.5 mg Oral BID   mouth rinse   15 mL Mouth Rinse 4 times per day   Continuous Infusions:  sodium chloride 10 mL/hr at 12/21/22 1500   cefTRIAXone (ROCEPHIN)  IV Stopped (12/21/22 1024)   heparin 850 Units/hr (12/21/22 1551)   lactated ringers 100 mL/hr at 12/21/22 1500   norepinephrine (LEVOPHED) Adult infusion Stopped (12/21/22 0906)   valproate sodium 500 mg (12/21/22 1549)   PRN Meds:.acetaminophen **OR** acetaminophen, LORazepam, ondansetron **OR** ondansetron (ZOFRAN) IV, mouth rinse, senna-docusate Medications Prior to Admission:  Prior to Admission medications   Medication Sig Start Date End Date Taking? Authorizing Provider  acetaminophen (TYLENOL) 325 MG tablet Take 2 tablets (650 mg total) by mouth every 6 (six) hours as needed for mild pain, fever or headache. 01/22/21  Yes Nicole Kindred A, DO  amLODipine (NORVASC) 5 MG tablet Take 1 tablet (5 mg total) by mouth daily. 04/21/21  Yes Lavina Hamman, MD  apixaban (ELIQUIS) 5 MG TABS tablet Take 1 tablet (5 mg total) by mouth 2 (two) times daily. 01/22/21  Yes Nicole Kindred A, DO  atorvastatin (LIPITOR) 80 MG tablet Take 80 mg by mouth daily. 03/15/21  Yes [provider]  bisacodyl (DULCOLAX) 10 MG suppository Place 1 suppository (10 mg total) rectally daily as needed for severe constipation. 01/22/21  Yes Nicole Kindred A, DO  bisacodyl (DULCOLAX) 5 MG EC tablet Take 1 tablet (5 mg total) by mouth daily as needed for moderate constipation. 01/22/21  Yes Nicole Kindred A, DO  cholecalciferol (VITAMIN D3) 25 MCG (1000 UNIT) tablet Take 1,000 Units by mouth daily.   Yes [provider]  famotidine (PEPCID) 20 MG tablet Take 1 tablet (20 mg total) by mouth daily. 04/21/21  Yes Lavina Hamman, MD  hydrocerin (EUCERIN) CREA Apply 1 application topically 2 (two) times daily. 01/22/21  Yes Nicole Kindred A, DO  ketoconazole (NIZORAL) 2 % shampoo Apply 1 application topically 2 (two) times a week.   Yes [provider]  levETIRAcetam (KEPPRA)  1000 MG tablet 1,000 mg 2 (two) times daily. 03/10/20  Yes [provider]  lidocaine (LIDODERM) 5 % Place 1 patch onto the skin daily. Remove & Discard patch within 12 hours or as directed by MD 04/21/21  Yes Lavina Hamman, MD  metFORMIN (GLUCOPHAGE) 1000 MG tablet Take 1,000 mg by mouth 2 (two) times daily. 03/15/21  Yes [provider]  metoprolol tartrate (LOPRESSOR) 25 MG tablet Take 0.5 tablets (12.5 mg total) by  mouth 2 (two) times daily. 04/20/21  Yes Lavina Hamman, MD  Multiple Vitamin (MULTIVITAMIN WITH MINERALS) TABS tablet Take 1 tablet by mouth daily. 01/23/21  Yes Nicole Kindred A, DO  nystatin cream (MYCOSTATIN) Apply 1 Application topically 2 (two) times daily. 09/16/22  Yes [provider]  polyethylene glycol (MIRALAX / GLYCOLAX) 17 g packet Take 17 g by mouth daily. 01/23/21  Yes Nicole Kindred A, DO  QUEtiapine (SEROQUEL) 25 MG tablet Take 1 tablet (25 mg total) by mouth 2 (two) times daily as needed (agitation). 01/22/21  Yes Nicole Kindred A, DO  QUEtiapine (SEROQUEL) 50 MG tablet Take 1 tablet (50 mg total) by mouth at bedtime. 01/22/21  Yes Nicole Kindred A, DO  ramipril (ALTACE) 10 MG capsule Take 10 mg by mouth 2 (two) times daily.   Yes [provider]  rivastigmine (EXELON) 1.5 MG capsule Take 1 capsule (1.5 mg total) by mouth 2 (two) times daily. 01/22/21  Yes Nicole Kindred A, DO  senna-docusate (SENOKOT-S) 8.6-50 MG tablet Take 1 tablet by mouth 2 (two) times daily. 01/22/21  Yes Nicole Kindred A, DO  torsemide (DEMADEX) 20 MG tablet Take 20 mg by mouth daily.   Yes [provider]  triamcinolone cream (KENALOG) 0.1 % Apply topically 2 (two) times daily. 01/22/21  Yes Nicole Kindred A, DO  feeding supplement (ENSURE ENLIVE / ENSURE PLUS) LIQD Take 237 mLs by mouth 2 (two) times daily between meals. 01/23/21   Ezekiel Slocumb, DO   Allergies  Allergen Reactions   Haloperidol Other (See Comments)    Catatonic    Doxycycline  Swelling   Lovastatin Swelling and Other (See Comments)    Oral swelling, not requiring ER, no problem with Lipitor   Review of Systems  Unable to perform ROS   Physical Exam Pulmonary:     Effort: Pulmonary effort is normal.     Comments: Intermittent cough Neurological:     Mental Status: He is alert.     Vital Signs: BP 106/68   Pulse 61   Temp 98.3 F (36.8 C) (Oral)   Resp (!) 27   Ht 6' (1.829 m)   Wt 91.5 kg   SpO2 96%   BMI 27.36 kg/m  Pain Scale: 0-10   Pain Score: 0-No pain   SpO2: SpO2: 96 % O2 Device:SpO2: 96 % O2 Flow Rate: .O2 Flow Rate (L/min): 3 L/min  IO: Intake/output summary:  Intake/Output Summary (Last 24 hours) at 12/21/2022 1624 Last data filed at 12/21/2022 1556 Gross per 24 hour  Intake 5882.09 ml  Output 201 ml  Net 5681.09 ml    LBM: Last BM Date : 12/19/22 Baseline Weight: Weight: 97 kg Most recent weight: Weight: 91.5 kg        Signed by: Asencion Gowda, NP   Please contact Palliative Medicine Team phone at [phone] for questions and concerns.  For individual provider: See Shea Evans

## 2022-12-21 NOTE — Progress Notes (Signed)
Bladder scan with 269ml of urine.  Dr. Arbutus Ped notified of bladder scan results and resulted blood work.  Orders placed by MD.

## 2022-12-21 NOTE — Assessment & Plan Note (Signed)
Stable, no chest pain.  Suspect demand ischemia explains elevated troponin.   --Stop IV heparin and metoprolol

## 2022-12-21 NOTE — Progress Notes (Signed)
Initial Nutrition Assessment  DOCUMENTATION CODES:   Not applicable  INTERVENTION:   RD will add supplements pending SLP evaluation.   MVI po daily with diet advancement   Pt at high refeed risk; recommend monitor potassium, magnesium and phosphorus labs daily until stable  Daily weights   NUTRITION DIAGNOSIS:   Inadequate oral intake related to acute illness as evidenced by NPO status.  GOAL:   Patient will meet greater than or equal to 90% of their needs  MONITOR:   PO intake, Supplement acceptance, Labs, Weight trends, I & O's, Skin  REASON FOR ASSESSMENT:   Malnutrition Screening Tool    ASSESSMENT:   82 year old male with history of chronic atrial fibrillation, hypertension, seizure disorder, B12 deficiency, hyperlipidemia, Lewy body dementia with behavioral disturbances, CAD, non-insulin-dependent diabetes mellitus, spondylitis and gout who is admitted with sepsis and AKI.  Met with pt and family in room today. Pt reports that he is feeling good today. Pt's wife at bedside reports pt with good appetite and oral intake at baseline. SLP evaluation is pending. RD will add supplements and MVI with diet advancement. Pt is at refeed risk. Per chart, pt appears weight stable at baseline.   Medications reviewed and include: insulin, ceftriaxone, heparin, LRS @100ml /hr, levophed  Labs reviewed: K 4.4 wnl, BUN 45(H), creat 2.22(H), Mg 1.9 wnl Wbc- 10.9(H), Hgb 9.7(L), Hct 31.6(L) Cbgs- 109, 124 x 24 hrs  AIC 6.3(H)- 01/2021  NUTRITION - FOCUSED PHYSICAL EXAM:  Flowsheet Row Most Recent Value  Orbital Region No depletion  Upper Arm Region No depletion  Thoracic and Lumbar Region No depletion  Buccal Region No depletion  Temple Region No depletion  Clavicle Bone Region Mild depletion  Clavicle and Acromion Bone Region Mild depletion  Scapular Bone Region No depletion  Dorsal Hand No depletion  Patellar Region No depletion  Anterior Thigh Region No depletion   Posterior Calf Region No depletion  Edema (RD Assessment) Mild  Hair Reviewed  Eyes Reviewed  Mouth Reviewed  Skin Reviewed  Nails Reviewed   Diet Order:   Diet Order             Diet NPO time specified Except for: Sips with Meds, Ice Chips  Diet effective now                  EDUCATION NEEDS:   No education needs have been identified at this time  Skin:  Skin Assessment: Reviewed RN Assessment (ecchymosis, Stage I buttocks, DTI buttocks)  Last BM:  3/20- type 6  Height:   Ht Readings from Last 1 Encounters:  12/12/2022 6' (1.829 m)    Weight:   Wt Readings from Last 1 Encounters:  12/21/22 91.5 kg    Ideal Body Weight:  80.9 kg  BMI:  Body mass index is 27.36 kg/m.  Estimated Nutritional Needs:   Kcal:  1900-2200kcal/day  Protein:  95-110g/day  Fluid:  1.9-2.2L/day  Koleen Distance MS, RD, LDN Please refer to [facility] for RD and/or RD on-call/weekend/after hours pager

## 2022-12-22 DIAGNOSIS — R6521 Severe sepsis with septic shock: Secondary | ICD-10-CM | POA: Diagnosis not present

## 2022-12-22 DIAGNOSIS — A419 Sepsis, unspecified organism: Secondary | ICD-10-CM | POA: Diagnosis not present

## 2022-12-22 DIAGNOSIS — J9601 Acute respiratory failure with hypoxia: Secondary | ICD-10-CM | POA: Diagnosis present

## 2022-12-22 DIAGNOSIS — Z515 Encounter for palliative care: Secondary | ICD-10-CM

## 2022-12-22 DIAGNOSIS — Z7189 Other specified counseling: Secondary | ICD-10-CM | POA: Diagnosis not present

## 2022-12-22 LAB — ECHOCARDIOGRAM COMPLETE
AR max vel: 1.09 cm2
AV Area VTI: 1.18 cm2
AV Area mean vel: 1.07 cm2
AV Mean grad: 9.3 mmHg
AV Peak grad: 15 mmHg
Ao pk vel: 1.93 m/s
Area-P 1/2: 4.06 cm2
Height: 72 in
S' Lateral: 3.4 cm
Weight: 3227.53 oz

## 2022-12-22 LAB — CBC
HCT: 29.2 % — ABNORMAL LOW (ref 39.0–52.0)
Hemoglobin: 9.3 g/dL — ABNORMAL LOW (ref 13.0–17.0)
MCH: 32 pg (ref 26.0–34.0)
MCHC: 31.8 g/dL (ref 30.0–36.0)
MCV: 100.3 fL — ABNORMAL HIGH (ref 80.0–100.0)
Platelets: 107 10*3/uL — ABNORMAL LOW (ref 150–400)
RBC: 2.91 MIL/uL — ABNORMAL LOW (ref 4.22–5.81)
RDW: 13.3 % (ref 11.5–15.5)
WBC: 8.2 10*3/uL (ref 4.0–10.5)
nRBC: 0 % (ref 0.0–0.2)

## 2022-12-22 LAB — COMPREHENSIVE METABOLIC PANEL
ALT: 27 U/L (ref 0–44)
AST: 44 U/L — ABNORMAL HIGH (ref 15–41)
Albumin: 2.7 g/dL — ABNORMAL LOW (ref 3.5–5.0)
Alkaline Phosphatase: 51 U/L (ref 38–126)
Anion gap: 8 (ref 5–15)
BUN: 52 mg/dL — ABNORMAL HIGH (ref 8–23)
CO2: 23 mmol/L (ref 22–32)
Calcium: 9.1 mg/dL (ref 8.9–10.3)
Chloride: 105 mmol/L (ref 98–111)
Creatinine, Ser: 2.06 mg/dL — ABNORMAL HIGH (ref 0.61–1.24)
GFR, Estimated: 32 mL/min — ABNORMAL LOW (ref >60)
Glucose, Bld: 77 mg/dL (ref 70–99)
Potassium: 4.3 mmol/L (ref 3.5–5.1)
Sodium: 136 mmol/L (ref 135–145)
Total Bilirubin: 0.7 mg/dL (ref 0.3–1.2)
Total Protein: 6.3 g/dL — ABNORMAL LOW (ref 6.5–8.1)

## 2022-12-22 LAB — PROCALCITONIN: Procalcitonin: 7.14 ng/mL

## 2022-12-22 LAB — MAGNESIUM: Magnesium: 2.3 mg/dL (ref 1.7–2.4)

## 2022-12-22 LAB — GLUCOSE, CAPILLARY: Glucose-Capillary: 76 mg/dL (ref 70–99)

## 2022-12-22 LAB — HEPARIN LEVEL (UNFRACTIONATED): Heparin Unfractionated: >1.1 IU/mL — ABNORMAL HIGH (ref 0.30–0.70)

## 2022-12-22 LAB — APTT: aPTT: 79 seconds — ABNORMAL HIGH (ref 24–36)

## 2022-12-22 LAB — LACTIC ACID, PLASMA: Lactic Acid, Venous: 2.1 mmol/L (ref 0.5–1.9)

## 2022-12-22 MED ORDER — HALOPERIDOL LACTATE 5 MG/ML IJ SOLN: 0.5000 mg | INTRAMUSCULAR | Status: DC | PRN

## 2022-12-22 MED ORDER — ACETAMINOPHEN 325 MG PO TABS: 650.0000 mg | ORAL_TABLET | Freq: Four times a day (QID) | ORAL | Status: DC | PRN

## 2022-12-22 MED ORDER — POLYVINYL ALCOHOL 1.4 % OP SOLN: 1.0000 [drp] | Freq: Four times a day (QID) | OPHTHALMIC | Status: DC | PRN

## 2022-12-22 MED ORDER — HYDROMORPHONE BOLUS VIA INFUSION: 2.0000 mg | INTRAVENOUS | Status: DC | PRN

## 2022-12-22 MED ORDER — DEXTROSE IN LACTATED RINGERS 5 % IV SOLN: INTRAVENOUS | Status: DC

## 2022-12-22 MED ORDER — HYDROMORPHONE HCL-NACL 50-0.9 MG/50ML-% IV SOLN
2.0000 mg/h | INTRAVENOUS | Status: DC
  Administered 2022-12-22: 2 mg/h via INTRAVENOUS
  Filled 2022-12-22: qty 50

## 2022-12-22 MED ORDER — BIOTENE DRY MOUTH MT LIQD: 15.0000 mL | OROMUCOSAL | Status: DC | PRN

## 2022-12-22 MED ORDER — LORAZEPAM 2 MG/ML IJ SOLN: 1.0000 mg | INTRAMUSCULAR | Status: DC | PRN

## 2022-12-22 MED ORDER — GLYCOPYRROLATE 0.2 MG/ML IJ SOLN: 0.2000 mg | INTRAMUSCULAR | Status: DC | PRN

## 2022-12-22 MED ORDER — HALOPERIDOL 0.5 MG PO TABS: 0.5000 mg | ORAL_TABLET | ORAL | Status: DC | PRN

## 2022-12-22 MED ORDER — GLYCOPYRROLATE 1 MG PO TABS: 1.0000 mg | ORAL_TABLET | ORAL | Status: DC | PRN

## 2022-12-22 MED ORDER — HALOPERIDOL LACTATE 2 MG/ML PO CONC: 0.5000 mg | ORAL | Status: DC | PRN

## 2022-12-22 MED ORDER — ACETAMINOPHEN 650 MG RE SUPP: 650.0000 mg | Freq: Four times a day (QID) | RECTAL | Status: DC | PRN

## 2022-12-23 LAB — CULTURE, BLOOD (ROUTINE X 2)
Special Requests: ADEQUATE
Special Requests: ADEQUATE

## 2023-01-02 NOTE — Progress Notes (Addendum)
Daily Progress Note   Patient Name: Ian King       Date: 2023-01-07 DOB: 03-26-1941  Age: 82 y.o. MRN#: [id number] Attending Physician: Ezekiel Slocumb, DO Primary Care Physician: Idelle Crouch, MD Admit Date: 12/27/2022  Reason for Consultation/Follow-up: Establishing goals of care  Subjective: Notes and labs reviewed.  In to see patient.  Patient is resting in bed on BiPAP, and is tachypneic with a respiratory rate of between 35 and 42.  Wife is at bedside.  Wife discusses that earlier this morning patient was speaking to her, and then asked her to kiss him goodbye, and then he told her to get out.  She states attending has recently rounded and they have discussed transitioning to comfort care as soon as the family gets here, and have discussed possible anticipated hospital death.  Stepped out and spoke with nursing who discusses that patient had been stable but then asked his wife to kiss him goodbye, became agitated telling her to get out, and then abruptly desatted requiring BiPAP.  While speaking to the nurse patient's wife came out of room and had patient's children on speaker phone.  Patient's daughter states that she is a Designer, jewellery.  Discussed the occurrences of this morning and the children state that they want to switch to comfort care now, and do not want [place] to wait for them to arrive.  Discussed that patient could die prior to their arrival, they state they understand this and would like to switch to comfort care now.  Wife confirms the same.  Epic chat sent to attending and nurse confirming same. Family states they do not want him to suffer, particularly with waiting for them to arrive.  MOST form completed and then orders placed to initiate comfort care.  I completed  a MOST form today with wife after speaking with his children on speaker phone and the signed original was placed in the chart. The form was scanned and sent to medical records for it to be uploaded under ACP tab in Epic. A photocopy was also placed in the chart to be scanned into EMR. The patient outlined their wishes for the following treatment decisions:  Cardiopulmonary Resuscitation: Do Not Attempt Resuscitation (DNR/No CPR)  Medical Interventions: Comfort Measures: Keep clean, warm, and dry. Use medication by any route,  positioning, wound care, and other measures to relieve pain and suffering. Use oxygen, suction and manual treatment of airway obstruction as needed for comfort. Do not transfer to the hospital unless comfort needs cannot be met in current location.  Antibiotics: No antibiotics (use other measures to relieve symptoms)  IV Fluids: No IV fluids (provide other measures to ensure comfort)  Feeding Tube: No feeding tube    ADDENDUM:  Returned to bedside to check on patient.  Patient's wife and children were at bedside.  Patient is off BiPAP.  He appears to be resting in bed comfortably with Dilaudid infusion in place.  Daughter discusses that patient looks better as he had abdominal breathing when she arrived.  We discussed his tachypnea on BiPAP when I was present earlier.  Discussed dying process and all questions answered.  Discussed hospital death.  Discussed comfort care orders in great detail as daughter is a Designer, jewellery at the [employer].  Length of Stay: 2  Current Medications: Scheduled Meds:   Chlorhexidine Gluconate Cloth  6 each Topical Daily   mouth rinse  15 mL Mouth Rinse 4 times per day    Continuous Infusions:  HYDROmorphone      PRN Meds: acetaminophen **OR** acetaminophen, antiseptic oral rinse, [DISCONTINUED] glycopyrrolate **OR** glycopyrrolate **OR** glycopyrrolate, [DISCONTINUED] haloperidol **OR** haloperidol **OR** haloperidol lactate, HYDROmorphone,  LORazepam, LORazepam, ondansetron **OR** ondansetron (ZOFRAN) IV, mouth rinse, polyvinyl alcohol  Physical Exam Constitutional:      Comments: Eyes closed  Pulmonary:     Comments: On BiPAP.  Tachypneic.            Vital Signs: BP 135/88 (BP Location: Right Arm)   Pulse 87   Temp 98.6 F (37 C)   Resp (!) 25   Ht 6' (1.829 m)   Wt 88.1 kg   SpO2 (!) 83%   BMI 26.34 kg/m  SpO2: SpO2: (!) 83 % O2 Device: O2 Device: Bi-PAP O2 Flow Rate: O2 Flow Rate (L/min): 3 L/min  Intake/output summary:  Intake/Output Summary (Last 24 hours) at 01-09-23 1118 Last data filed at 01-09-2023 1003 Gross per 24 hour  Intake 3970.91 ml  Output 826 ml  Net 3144.91 ml   LBM: Last BM Date : 12/21/22 Baseline Weight: Weight: 97 kg Most recent weight: Weight: 88.1 kg         Patient Active Problem List   Diagnosis Date Noted   Severe sepsis ([place]) 12/25/2022   Prolonged QT interval 12/14/2022   Elevated troponin 12/21/2022   AKI (acute kidney injury) ([place]) 12/18/2022   Respiratory acidosis 12/19/2022   Hypomagnesemia 12/03/2022   Pressure injury of skin 12/09/2022   Septic shock ([place]) 12/18/2022   Trauma 04/16/2021   Fall at home, initial encounter 04/14/2021   Stroke ([place]) 04/14/2021   Dementia ([place]) 04/14/2021   Right rib fracture 04/14/2021   Renal lesion 04/14/2021   Gout flare 01/21/2021   Stroke (cerebrum) ([place]) 01/17/2021   AMS (altered mental status) 01/16/2021   Raynaud phenomenon 03/22/2020   Chronic venous insufficiency 03/22/2020   B12 deficiency 03/10/2020   Acute ischemic multifocal anterior circulation stroke ([place]) 03/09/2020   Pseudophakia of left eye 07/02/2018   Obesity, Class I, BMI 30-34.9 02/15/2018   Closed displaced fracture of distal phalanx of left middle finger 03/08/2017   Closed fracture of distal end of left radius with routine healing 01/16/2017   Closed fracture of nasal bone with routine healing 01/12/2017   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    Closed fracture of dorsal (  thoracic) vertebra ([place]) 12/19/2016   Fall 12/19/2016   Fracture of left radius 12/19/2016   Primary osteoarthritis of right knee 10/14/2015   Acute pain of right knee 09/09/2015   Aftercare following joint replacement surgery 07/31/2015   Closed nondisplaced fracture of lateral condyle of femur ([place]) 07/31/2015   Bilateral edema of lower extremity 06/26/2014   Chronic atrial fibrillation ([place]) 07/13/2013   Wound disruption, post-op, skin 10/11/2012   Cellulitis and abscess 10/05/2012   Hyperlipidemia 08/17/2012   Osteoarthritis 08/17/2012   Seizure disorder ([place]) 08/16/2012   Hip pain 12/12/2011   Osteoarthritis of hip 12/08/2011   CAD (coronary artery disease) 07/07/2011   Diabetes mellitus without complication ([place]) [id number]   HTN (hypertension) 07/07/2011    Palliative Care Assessment & Plan    Recommendations/Plan: Patient's wife and 2 children on the phone would like to switch to comfort care now.  They are aware that patient will most likely have a hospital death, and may die before the children arrived to the hospital.  Family states they do not want him to suffer, particularly with waiting for them to arrive. MOST form completed Orders placed.                                                              Code Status:    Code Status Orders  (From admission, onward)           Start     Ordered   01/11/23 1104  Do not attempt resuscitation (DNR)  Continuous       Question Answer Comment  If patient has no pulse and is not breathing Do Not Attempt Resuscitation   If patient has a pulse and/or is breathing: Medical Treatment Goals COMFORT MEASURES: Keep clean/warm/dry, use medication by any route; positioning, wound care and other measures to relieve pain/suffering; use oxygen, suction/manual treatment of airway obstruction for comfort; do not transfer unless for comfort needs.   Consent: Discussion documented in EHR or advanced  directives reviewed      January 11, 2023 1109           Code Status History     Date Active Date Inactive Code Status Order ID Comments User Context   12/12/2022 1520 Jan 11, 2023 1109 DNR [id number]  Criss Alvine, DO Inpatient   12/28/2022 1246 12/29/2022 1520 Full Code [id number]  Criss Alvine, DO ED   12/16/2022 1204 12/30/2022 1246 DNR [id number]  [name], MD ED   04/14/2021 0750 04/20/2021 2258 DNR [id number]  Ivor Costa, MD ED   01/16/2021 1224 01/22/2021 2103 DNR [id number]  Kayleen Memos, DO ED   01/16/2021 1048 01/16/2021 1223 DNR [id number]  Lucrezia Starch, MD ED      Advance Directive Documentation    Flowsheet Row Most Recent Value  Type of Advance Directive Out of facility DNR (pink MOST or yellow form)  Pre-existing out of facility DNR order (yellow form or pink MOST form) Pink MOST/Yellow Form most recent copy in chart - Physician notified to receive inpatient order, Physician notified to receive inpatient order  "MOST" Form in Place? --       Prognosis:  Hours - Days    Care plan was discussed with RN and attending via epic chat  Thank you for allowing the Palliative  Medicine Team to assist in the care of this patient.    Asencion Gowda, NP  Please contact Palliative Medicine Team phone at [phone] for questions and concerns.

## 2023-01-02 NOTE — Assessment & Plan Note (Addendum)
As of 24-Dec-2022 mid-morning after family meeting with palliative care.   --Comfort care per orders --Notify provider if signs of pain, distress, discomfort despite ordered meds --Currently anticipate in hospital death, will monitor --No further labs or sticks

## 2023-01-02 NOTE — Assessment & Plan Note (Signed)
SpO2 of 88% on 6 L/min [place] O2 in the ED. BiPAP needed for work of breathing. Due to aspiration pneumonia.

## 2023-01-02 NOTE — Consult Note (Signed)
ANTICOAGULATION CONSULT NOTE   Pharmacy Consult for heparin Indication: chest pain/ACS  Allergies  Allergen Reactions   Haloperidol Other (See Comments)    Catatonic    Doxycycline Swelling   Lovastatin Swelling and Other (See Comments)    Oral swelling, not requiring ER, no problem with Lipitor    Patient Measurements: Height: 6' (182.9 cm) Weight: 91.5 kg (201 lb 11.5 oz) IBW/kg (Calculated) : 77.6 Heparin Dosing Weight: 97 kg  Vital Signs: Temp: 98.5 F (36.9 C) 12/26/2022 0400) Temp Source: Oral 12/26/22 0400) BP: 118/94 12-26-2022 0500) Pulse Rate: 71 2022/12/26 0500)  Labs: Recent Labs    12/06/2022 0957 12/21/2022 1236 12/19/2022 1521 12/30/2022 1715 12/11/2022 2127 12/21/22 0911 12/21/22 2041 Dec 26, 2022 0435  HGB 11.0*  --   --   --   --  9.7*  --  9.3*  HCT 35.7*  --   --   --   --  31.6*  --  29.2*  PLT 154  --   --   --   --  126*  --  107*  APTT 35  --   --   --    < > 105* 87* 79*  LABPROT 29.3*  --   --   --   --  27.3*  --   --   INR 2.8*  --   --   --   --  2.6*  --   --   HEPARINUNFRC >1.10*  --   --   --   --  >1.10*  --  >1.10*  CREATININE 2.10*  --   --   --   --  2.22*  --  2.06*  TROPONINIHS 4,767*   < > 7,934* 8,992*  --  6,579*  --   --    < > = values in this interval not displayed.     Estimated Creatinine Clearance: 30.3 mL/min (A) (by C-G formula based on SCr of 2.06 mg/dL (H)).   Medical History: Past Medical History:  Diagnosis Date   A-fib ([place])    Diabetes mellitus without complication ([place])    Hypertension     Medications:  PTA: Apixaban 5mg  BID (last dose 3/18 at 2000) Inpatient: heparin infusion (3/19 >>) Allergies: No AC/APT related allergies   Assessment: Pt is an 66 YOM presenting with concerns of NSTEMI. Troponin is elevated at 4.8k. PMH includes Afib, stroke, HTN, dementia, HLD, CAD. Last dose of apixaban was last night (12/19/2022 PM). INR is elevated at 2.8, patient is not on warfarin and no signs of cirrhosis based on labs. Unsure  of reason why INR is elevated. CBC currently stable. No note of bleeding. Since patient was on apixaban prior to admission, predict anti-Xa level will be falsely elevated. Will monitor with aPTTs.  Date Time aPTT/HL Rate/Comment 3/20 0911 105 / >1.10 Therapeutic x1 / 850 u/hr  3/20 2041 87 / ---  Therapueic x2 / 850 u/hr 12/26/22 0435 79 / >1.1 Therapeutic x 3  Goal of Therapy:  aPTT 66-102 seconds Monitor platelets by anticoagulation protocol: Yes   Plan:  Continue heparin infusion at 850 units/hr Check aPTT/Anti-Xa level tomorrow morning with AM labs Titrate by aPTT's until lab correlation is noted, then titrate by anti-xa alone. Continue to monitor H&H and platelets daily while on heparin gtt.  Renda Rolls, PharmD, [facility] 12-26-22 5:21 AM

## 2023-01-02 NOTE — Progress Notes (Signed)
SLP Cancellation Note  Patient Details Name: Ian King MRN: [id number] DOB: [birth year]   Cancelled treatment:       Reason Eval/Treat Not Completed: Medical issues which prohibited therapy. Per RN, Imma, pt given Ativan and is now on BiPap. Pt not appropriate for swallowing evaluation at ths time. Will continue efforts as appropriate.  Cherrie Gauze, M.S., [facility] [phone] ([place])  Quintella Baton 01-17-2023, 10:20 AM

## 2023-01-02 NOTE — Progress Notes (Signed)
Pt was very pleasant since change of shift at 0730, laughing with caregiver and staff all morning, Able to tell his name, follow command. When wife got to room, he was happy and they talked for few minutes and he said I am going now,. He asked they wife to give a kiss and after she kissed her, His demeanor changed insatiately and he started yelling at her, " get out of her now", get out, get out", his oxygen sat started dropping and he  became very agitated, he keep yelling at her to get out. Pt oxygen dropped down to 70% with good wave form. NASAL CANNULA oxy increased to 5-6 L, sat will not come up and pt was placed on BiPAP at 60% sat didn't go above 80%, oxygen was increase to 100% and it took a while for his sat to come up  to 92-94% on 100%.

## 2023-01-02 NOTE — Progress Notes (Signed)
Progress Note   Patient: Ian King [id number] DOB: 06-27-1941 DOA: 12/10/2022     2 DOS: the patient was seen and examined on 01/20/2023   Brief hospital course: Mr. Javonni Frisbie is a 82 year old male with history of chronic atrial fibrillation, hypertension, seizure disorder, B12 deficiency, hyperlipidemia, Lewy body dementia with behavioral disturbances, CAD, non-insulin-dependent diabetes mellitus who presented to the ED on 12/29/2022 for evaluation of worsening shortness of breath and weakness with productive cough.  Vitals in the ED showed temperature of 101.9, respiration rate of 41, blood pressure 116/57, heart rate of 94, SpO2 of 88% on 6 L/min [place] O2.  He was placed on BiPAP in the ED. VBG showed 7.2 4/62/45. Labs were most notable for creatinine of 2.50 (baseline around 0.8), WBC 8.9.  BNP elevated 646.5.  hs-troponin elevated at 4767 and trended upward. Lactic acid elevated at 6.4. COVID/influenza A/influenza B/RSV PCR were negative. UA was negative for leukocytes and nitrates.  Chest x-ray was concerning for pneumonia vs pulmonary edema (read as diffuse bilateral interstitial opacity, most pronounced in the perihilar and bibasilar regions.  Findings may reflect pulmonary edema versus multifocal atypical viral infection).  Patient was started on IV antibiotics, IV heparin due to elevated troponin, and IV fluids per sepsis protocol.  BP's initially responded to fluids, but he later required Levophed for BP support in stepdown unit.  Goals of care were discussed on admission.  Family agreeable to vasopressors as needed, but confirm DNR / DNI status and would not want any invasive measures.  3/20 - weaning off Levophed this AM.  On 4 L/min [place] O2.  Minimal urine output despite sepsis fluids.  Troponin peaked and down-trending. BNP improved.   Lactic acid still 3.7, getting more IV fluids.  Echo ordered to assess wall motion.  Remains on heparin.  01-20-2023 - family meeting with  palliative care.  Transitioned to comfort care measures.   Assessment and Plan: * Septic shock ([place]) With hypotension refractory to fluids, required Levophed, and multiple organ dysfunction (cardiac, renal, respiratory, encephalopathy). Suspect infection source is multifocal pneumonia, possibly aspiration. --Stop IV antibiotics --Supportive care, O2 PRN for comfort   Comfort measures only status As of 01-20-2023 mid-morning after family meeting with palliative care.   --Comfort care per orders --Notify provider if signs of pain, distress, discomfort despite ordered meds --Currently anticipate in hospital death, will monitor --No further labs or sticks  AKI (acute kidney injury) ([place]) Oliguria Suspect due to poor p.o. intake, and septic shock, multifactorial.   Cr on admission 2.10 (baseline 0.79). Given IV fluids per sepsis protocol. 3/20: Cr 2.22 this AM 01/20/2023: Cr 2.06 NO further labs, on comfort care  Elevated troponin Troponin peaked at 8992 >> 6579 today (3/20)  Doubt ACS, pt denies chest pain. Demand Ischemia - due to septic shock  --Treated with empiric IV heparin  --Echo to assess wall motion - report is pending --No further labs    Prolonged QT interval No further monitoring, on comfort care. Presumed secondary to septic shock and hypomagnesemia. --Ativan IV PRN seizure activity  Hypomagnesemia No further labs, on comfort care Mg 1.6 on admission, replaced.  AMS (altered mental status) Due to infection / septic shock, superimposed on baseline dementia.  3/20 - mental status at baseline per family --Mgmt of underlying issues as outlined. --Delirium precautions  Seizure disorder ([place]) Per family at bedside, patient has not had a seizure in over 40 years Home Keppra held on admission due to prolonged QT --Curbside discussed  alternative with neurology who recommends initial loading dose with valproic acid 20 mg/kg followed by 500 mg IV 3 times daily --Now on  comfort care  Chronic atrial fibrillation (HCC) Held Eliquis while on heparin gtt.  CAD (coronary artery disease) Stable, no chest pain.  Suspect demand ischemia explains elevated troponin.   --Stop IV heparin and metoprolol  Bilateral edema of lower extremity Present on admission, likely due to venous insufficiency.   Monitor with giving sepsis fluids.  BNP was elevated, but trended down today despite getting sepsis fluids. --Elevate legs  Chronic venous insufficiency Noted. Monitor. Elevate legs.  Ankylosing spondylitis (HCC) Supportive care, pain control PRN.  Hyperlipidemia Stop Lipitor  Pressure injury of skin I agree with the wound description as outlined below.  Continue diligent wound care, frequent repositioning and offloading pressure areas. Monitor for signs of infection.  Optimize nutrition.  Pressure Injury 12/14/2022 Buttocks Medial Stage 1 -  Intact skin with non-blanchable redness of a localized area usually over a bony prominence. (Active)  12/24/2022 1700  Location: Buttocks  Location Orientation: Medial  Staging: Stage 1 -  Intact skin with non-blanchable redness of a localized area usually over a bony prominence.  Wound Description (Comments):   Present on Admission: Yes     Pressure Injury 12/28/2022 Buttocks Medial Deep Tissue Pressure Injury - Purple or maroon localized area of discolored intact skin or blood-filled blister due to damage of underlying soft tissue from pressure and/or shear. (Active)  12/26/2022 1700  Location: Buttocks  Location Orientation: Medial  Staging: Deep Tissue Pressure Injury - Purple or maroon localized area of discolored intact skin or blood-filled blister due to damage of underlying soft tissue from pressure and/or shear.  Wound Description (Comments):   Present on Admission: Yes    Respiratory acidosis Gated by patient with obstructive sleep apnea, does not wear CPAP machine and severe sepsis presumed secondary to  community-acquired/atypical pneumonia  Severe sepsis (HCC) With hypotension and multiple organ dysfunction: Cardiac, renal, respiratory, encephalopathy Etiology workup in progress Blood cultures x 2 are in process, check procalcitonin, continue to follow lactic acid, MRSA PCR, 20 pathogen respiratory panel ordered Continue ceftriaxone 2 g IV daily, 4 additional doses to complete 5-day course Azithromycin was discontinued due to new onset QT prolongation, doxycycline 100 mg IV twice daily were considered however patient has reported history of oral swelling with doxycycline Pharmacy consulted for antibiotic therapy for atypical pneumonia coverage Continue BiPAP therapy Admit to stepdown, inpatient  HTN (hypertension) Presented in septic shock, required Levophed, weaned off 3/20.   Stop metoprolol, on comfort care.        Subjective: Pt seen with wife and caregiver at bedside this AM.  He had just recently been placed back on bipap and is currently unresponsive.  Wife reports before bipap mask was placed on him, patient told her to give him a kiss, that it might be their last kiss.  Wife tearful but states she is likely to decide with family and palliative to pursue transition to comfort measures and end of life care.     Physical Exam: Vitals:   January 05, 2023 1100 01/05/2023 1130 01-05-2023 1145 January 05, 2023 1200  BP:      Pulse: 83 96 (!) 44 94  Resp: (!) 35 (!) 34 (!) 26 (!) 22  Temp:      TempSrc:      SpO2: 98% 90% (!) 72% (!) 56%  Weight:      Height:       General exam:  somnolent on bipap, unresponsive, no acute distress HEENT: bipap mask in place Respiratory system: on bipap, coarse rhonchi throughout, diminished bases due to poor inspirations, no expiratory wheezes Cardiovascular system: RRR, nonpitting peripheral edema.   Gastrointestinal system: soft, NT, ND Central nervous system: unable to evaluate due to somnolence, unresponsive Skin: dry, intact, normal  temperature Psychiatry: unable to evaluate due to somnolence, unresponsive   Data Reviewed:  Notable labs --- Cr 2.10 >> 2.22 >> 2.06,    repeat Lactic acid 3.7 >> 2.1, procal 11.43>>7.14,  WBC 10.9>>8.2, Hbg 9.7>>9.3, platelets 107k  Renal ultrasound -- no hydronephrosis or bladder distention.   Exophytic, cystic lesion at the inferior aspect of the left kidney, which measures up to 3.6 cm.  Echocardiogram --- done, report still pending   Family Communication: wife and caregiver at bedside on rounds this AM  Disposition: Status is: Inpatient Remains inpatient appropriate because: Severity of illness as outlined & ongoing evaluation   Planned Discharge Destination: Home    Time spent: 56 minutes including time at bedside and in coordination of care  Author: Ezekiel Slocumb, DO 09-Jan-2023 12:03 PM  For on call review [email or web address].

## 2023-01-02 NOTE — Progress Notes (Signed)
Pt is placed on comfort care, wife, daughter and some family members at bedside now. Comfort care measures in places ordered

## 2023-01-02 NOTE — Death Summary Note (Signed)
DEATH SUMMARY   Patient Details  Name: Ian King MRN: [id number] DOB: [birth year] [id number], Leonie Douglas, MD Admission/Discharge Information   Admit Date:  2022/12/31  Date of Death: Date of Death: 02-Jan-2023  Time of Death: Time of Death: 01-13-1308  Length of Stay: 2   Principle Cause of death: Aspiration pneumonia complicated by hypoxic respiratory failure  Hospital Diagnoses: Principal Problem:   Septic shock ([place]) Active Problems:   Prolonged QT interval   Elevated troponin   AKI (acute kidney injury) ([place])   Comfort measures only status   Hypomagnesemia   Seizure disorder ([place])   Acute metabolic encephalopathy   Bilateral edema of lower extremity   CAD (coronary artery disease)   Chronic atrial fibrillation ([place])   Chronic venous insufficiency   Ankylosing spondylitis ([place])   Hyperlipidemia   Pressure injury of skin   HTN (hypertension)   Respiratory acidosis   Acute respiratory failure with hypoxia [facility])   Hospital Course: Mr. Wayburn Classon is a 82 year old male with history of chronic atrial fibrillation, hypertension, seizure disorder, B12 deficiency, hyperlipidemia, Lewy body dementia with behavioral disturbances, CAD, non-insulin-dependent diabetes mellitus who presented to the ED on Dec 31, 2022 for evaluation of worsening shortness of breath and weakness with productive cough.  Vitals in the ED showed temperature of 101.9, respiration rate of 41, blood pressure 116/57, heart rate of 94, SpO2 of 88% on 6 L/min [place] O2.  He was placed on BiPAP in the ED. VBG showed 7.2 4/62/45. Labs were most notable for creatinine of 2.50 (baseline around 0.8), WBC 8.9.  BNP elevated 646.5.  hs-troponin elevated at 4767 and trended upward. Lactic acid elevated at 6.4. COVID/influenza A/influenza B/RSV PCR were negative. UA was negative for leukocytes and nitrates.  Chest x-ray was concerning for pneumonia vs pulmonary edema (read as diffuse bilateral interstitial opacity, most  pronounced in the perihilar and bibasilar regions.  Findings may reflect pulmonary edema versus multifocal atypical viral infection).  Patient was started on IV antibiotics, IV heparin due to elevated troponin, and IV fluids per sepsis protocol.  BP's initially responded to fluids, but he later required Levophed for BP support in stepdown unit.  Goals of care were discussed on admission.  Family agreeable to vasopressors as needed, but confirm DNR / DNI status and would not want any invasive measures.  3/20 - weaning off Levophed this AM.  On 4 L/min [place] O2.  Minimal urine output despite sepsis fluids.  Troponin peaked and down-trending. BNP improved.   Lactic acid still 3.7, getting more IV fluids.  Echo ordered to assess wall motion.  Remains on heparin.  2023-01-02 - family meeting with palliative care.  Transitioned to comfort care measures. Patient subsequently passed away this afternoon at 1309.    Assessment and Plan: * Septic shock ([place]) With hypotension refractory to fluids, required Levophed, and multiple organ dysfunction (cardiac, renal, respiratory, encephalopathy). Suspect infection source is multifocal pneumonia, possibly aspiration. --Stop IV antibiotics --Supportive care, O2 PRN for comfort   Comfort measures only status As of 2023-01-02 mid-morning after family meeting with palliative care.   --Comfort care per orders --Notify provider if signs of pain, distress, discomfort despite ordered meds --Currently anticipate in hospital death, will monitor --No further labs or sticks  AKI (acute kidney injury) ([place]) Oliguria Suspect due to poor p.o. intake, and septic shock, multifactorial.   Cr on admission 2.10 (baseline 0.79). Given IV fluids per sepsis protocol. 3/20: Cr 2.22 this AM 01-02-23: Cr 2.06 NO further labs, on  comfort care  Elevated troponin Troponin peaked at 8992 >> 6579 today (3/20)  Doubt ACS, pt denies chest pain. Demand Ischemia - due to septic shock  --Treated  with empiric IV heparin  --Echo to assess wall motion - report is pending --No further labs    Prolonged QT interval No further monitoring, on comfort care. Presumed secondary to septic shock and hypomagnesemia. --Ativan IV PRN seizure activity  Hypomagnesemia No further labs, on comfort care Mg 1.6 on admission, replaced.  Acute metabolic encephalopathy Due to infection / septic shock, superimposed on baseline dementia.  3/20 - mental status at baseline per family --Mgmt of underlying issues as outlined. --Delirium precautions  Seizure disorder ([place]) Per family at bedside, patient has not had a seizure in over 40 years Home Keppra held on admission due to prolonged QT --Curbside discussed alternative with neurology who recommends initial loading dose with valproic acid 20 mg/kg followed by 500 mg IV 3 times daily --Now on comfort care  Chronic atrial fibrillation (HCC) Held Eliquis while on heparin gtt.  CAD (coronary artery disease) Stable, no chest pain.  Suspect demand ischemia explains elevated troponin.   --Stop IV heparin and metoprolol  Bilateral edema of lower extremity Present on admission, likely due to venous insufficiency.   Monitor with giving sepsis fluids.  BNP was elevated, but trended down today despite getting sepsis fluids. --Elevate legs  Chronic venous insufficiency Noted. Monitor. Elevate legs.  Ankylosing spondylitis (HCC) Supportive care, pain control PRN.  Hyperlipidemia Stop Lipitor  Pressure injury of skin I agree with the wound description as outlined below.  Continue diligent wound care, frequent repositioning and offloading pressure areas. Monitor for signs of infection.  Optimize nutrition.  Pressure Injury 12/21/2022 Buttocks Medial Stage 1 -  Intact skin with non-blanchable redness of a localized area usually over a bony prominence. (Active)  12/18/2022 1700  Location: Buttocks  Location Orientation: Medial  Staging: Stage 1 -   Intact skin with non-blanchable redness of a localized area usually over a bony prominence.  Wound Description (Comments):   Present on Admission: Yes     Pressure Injury 12/08/2022 Buttocks Medial Deep Tissue Pressure Injury - Purple or maroon localized area of discolored intact skin or blood-filled blister due to damage of underlying soft tissue from pressure and/or shear. (Active)  12/02/2022 1700  Location: Buttocks  Location Orientation: Medial  Staging: Deep Tissue Pressure Injury - Purple or maroon localized area of discolored intact skin or blood-filled blister due to damage of underlying soft tissue from pressure and/or shear.  Wound Description (Comments):   Present on Admission: Yes    Acute respiratory failure with hypoxia (HCC) SpO2 of 88% on 6 L/min [place] O2 in the ED. BiPAP needed for work of breathing. Due to aspiration pneumonia.  Respiratory acidosis Gated by patient with obstructive sleep apnea, does not wear CPAP machine and severe sepsis presumed secondary to community-acquired/atypical pneumonia  Severe sepsis (HCC) With hypotension and multiple organ dysfunction: Cardiac, renal, respiratory, encephalopathy Etiology workup in progress Blood cultures x 2 are in process, check procalcitonin, continue to follow lactic acid, MRSA PCR, 20 pathogen respiratory panel ordered Continue ceftriaxone 2 g IV daily, 4 additional doses to complete 5-day course Azithromycin was discontinued due to new onset QT prolongation, doxycycline 100 mg IV twice daily were considered however patient has reported history of oral swelling with doxycycline Pharmacy consulted for antibiotic therapy for atypical pneumonia coverage Continue BiPAP therapy Admit to stepdown, inpatient  HTN (hypertension) Presented in septic  shock, required Levophed, weaned off 3/20.   Stop metoprolol, on comfort care.         Procedures: None  Consultations: Palliative Care, Infectious disease  The results  of significant diagnostics from this hospitalization (including imaging, microbiology, ancillary and laboratory) are listed below for reference.   Significant Diagnostic Studies: ECHOCARDIOGRAM COMPLETE  Result Date: January 03, 2023    ECHOCARDIOGRAM REPORT   Patient Name:   AYON GIPPLE Date of Exam: 12/21/2022 Medical Rec #:  [id number]      Height:       72.0 in Accession #:    [id number]     Weight:       201.7 lb Date of Birth:  Feb 17, 1941       BSA:          2.138 m Patient Age:    42 years       BP:           103/58 mmHg Patient Gender: M              HR:           75 bpm. Exam Location:  [facility] Procedure: 2D Echo, Cardiac Doppler and Color Doppler Indications:     Elevated Troponin  History:         Patient has no prior history of Echocardiogram examinations.                  Arrythmias:Atrial Fibrillation; Risk Factors:Diabetes and                  Hypertension.  Sonographer:     Sherrie Sport Referring Phys:  [id number] [facility] A Barbette Mcglaun Diagnosing Phys: Isaias Cowman MD  Sonographer Comments: Suboptimal apical window. IMPRESSIONS  1. Left ventricular ejection fraction, by estimation, is 50 to 55%. The left ventricle has low normal function. The left ventricle has no regional wall motion abnormalities. Indeterminate diastolic filling due to E-A fusion.  2. Right ventricular systolic function is normal. The right ventricular size is normal.  3. The mitral valve is normal in structure. Mild to moderate mitral valve regurgitation. No evidence of mitral stenosis.  4. The aortic valve is normal in structure. Aortic valve regurgitation is not visualized. Mild aortic valve stenosis.  5. The inferior vena cava is normal in size with greater than 50% respiratory variability, suggesting right atrial pressure of 3 mmHg. FINDINGS  Left Ventricle: Left ventricular ejection fraction, by estimation, is 50 to 55%. The left ventricle has low normal function. The left ventricle has no regional wall motion abnormalities. The  left ventricular internal cavity size was normal in size. There is no left ventricular hypertrophy. Indeterminate diastolic filling due to E-A fusion. Right Ventricle: The right ventricular size is normal. No increase in right ventricular wall thickness. Right ventricular systolic function is normal. Left Atrium: Left atrial size was normal in size. Right Atrium: Right atrial size was normal in size. Pericardium: There is no evidence of pericardial effusion. Mitral Valve: The mitral valve is normal in structure. Mild to moderate mitral valve regurgitation. No evidence of mitral valve stenosis. Tricuspid Valve: The tricuspid valve is normal in structure. Tricuspid valve regurgitation is mild . No evidence of tricuspid stenosis. Aortic Valve: The aortic valve is normal in structure. Aortic valve regurgitation is not visualized. Mild aortic stenosis is present. Aortic valve mean gradient measures 9.3 mmHg. Aortic valve peak gradient measures 15.0 mmHg. Aortic valve area, by VTI measures 1.18 cm. Pulmonic Valve: The pulmonic valve was  normal in structure. Pulmonic valve regurgitation is not visualized. No evidence of pulmonic stenosis. Aorta: The aortic root is normal in size and structure. Venous: The inferior vena cava is normal in size with greater than 50% respiratory variability, suggesting right atrial pressure of 3 mmHg. IAS/Shunts: No atrial level shunt detected by color flow Doppler.  LEFT VENTRICLE PLAX 2D LVIDd:         4.70 cm LVIDs:         3.40 cm LV PW:         1.10 cm LV IVS:        0.90 cm LVOT diam:     2.00 cm LV SV:         38 LV SV Index:   18 LVOT Area:     3.14 cm  RIGHT VENTRICLE RV Basal diam:  3.60 cm RV Mid diam:    3.60 cm RV S prime:     6.42 cm/s TAPSE (M-mode): 1.5 cm LEFT ATRIUM             Index        RIGHT ATRIUM           Index LA diam:        4.70 cm 2.20 cm/m   RA Area:     19.10 cm LA Vol (A2C):   62.5 ml 29.23 ml/m  RA Volume:   51.10 ml  23.90 ml/m LA Vol (A4C):   64.3 ml  30.07 ml/m LA Biplane Vol: 64.9 ml 30.35 ml/m  AORTIC VALVE AV Area (Vmax):    1.09 cm AV Area (Vmean):   1.07 cm AV Area (VTI):     1.18 cm AV Vmax:           193.33 cm/s AV Vmean:          143.000 cm/s AV VTI:            0.320 m AV Peak Grad:      15.0 mmHg AV Mean Grad:      9.3 mmHg LVOT Vmax:         67.10 cm/s LVOT Vmean:        48.500 cm/s LVOT VTI:          0.120 m LVOT/AV VTI ratio: 0.38  AORTA Ao Root diam: 3.50 cm MITRAL VALVE                TRICUSPID VALVE MV Area (PHT): 4.06 cm     TR Peak grad:   34.6 mmHg MV Decel Time: 187 msec     TR Vmax:        294.00 cm/s MV E velocity: 139.00 cm/s                             SHUNTS                             Systemic VTI:  0.12 m                             Systemic Diam: 2.00 cm Isaias Cowman MD Electronically signed by Isaias Cowman MD Signature Date/Time: 01-21-23/1:15:07 PM    Final    US RENAL  Result Date: 12/21/2022 CLINICAL DATA:  Oliguria EXAM: RENAL / URINARY TRACT ULTRASOUND COMPLETE COMPARISON:  No prior ultrasound available, correlation is made with  CT abdomen pelvis 05/10/2022 FINDINGS: Right Kidney: Renal measurements: 10.4 x 5.5 x 5.0 cm = volume: 149 mL. Echogenicity within normal limits. No mass or hydronephrosis visualized. Left Kidney: Renal measurements: 11.0 x 6.2 x 5.4 cm = volume: 195 mL. Echogenicity within normal limits. No hydronephrosis. Exophytic, cystic lesion at the inferior aspect of the left kidney, which measures 3.6 x 3.3 x 3.1 cm Bladder: Appears normal for degree of bladder distention. Bilateral bladder jets visualized. Other: None. IMPRESSION: 1. No hydronephrosis. 2. Exophytic, cystic lesion at the inferior aspect of the left kidney, which measures up to 3.6 cm. Electronically Signed   By: Merilyn Baba M.D.   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 Diffuse bilateral predominantly interstitial opacities, most pronounced in the perihilar and bibasilar regions. No pleural effusion or pneumothorax. Prior multilevel thoracic fusion. IMPRESSION: Diffuse bilateral interstitial opacities, most pronounced in the perihilar and bibasilar regions. Findings may reflect pulmonary edema versus multifocal atypical/viral infection. Electronically Signed   By: Davina Poke D.O.   On: 12/06/2022 09:56    Microbiology: Recent Results (from the past 240 hour(s))  Blood Culture (routine x 2)     Status: Abnormal (Preliminary result)   Collection Time: 12/24/2022  9:57 AM   Specimen: BLOOD  Result Value Ref Range Status   Specimen Description   Final    BLOOD LEFT ANTECUBITAL Performed at [facility], [address] [place], [place] [address]    Special Requests   Final    BOTTLES DRAWN AEROBIC AND ANAEROBIC Blood Culture adequate volume Performed at [facility], [address], [place], [place] [address]    Culture  Setup Time   Final    GRAM POSITIVE COCCI ANAEROBIC BOTTLE ONLY CRITICAL VALUE NOTED.  VALUE IS CONSISTENT WITH PREVIOUSLY REPORTED AND CALLED VALUE. Performed at [facility], [address] [place], [place] [address]    Culture STREPTOCOCCUS PNEUMONIAE (A)  Final   Report Status PENDING  Incomplete  Blood Culture (routine x 2)     Status: Abnormal (Preliminary result)   Collection Time: 12/13/2022  9:57 AM   Specimen: BLOOD  Result Value Ref Range Status   Specimen Description   Final    BLOOD BLOOD RIGHT FOREARM Performed at [facility], [address] [place], [place] [address]    Special Requests   Final    BOTTLES DRAWN AEROBIC AND ANAEROBIC Blood Culture adequate volume Performed at [facility], [address] [place], [place] [address]    Culture  Setup Time   Final    Organism ID to follow IN BOTH AEROBIC AND ANAEROBIC BOTTLES GRAM POSITIVE  COCCI CRITICAL RESULT CALLED TO, READ BACK BY AND VERIFIED WITH: NATHAN BELUE PHARMD @0321  12/21/22 ASW Performed at [facility], [address], [place], [place] [address]    Culture (A)  Final    STREPTOCOCCUS PNEUMONIAE SUSCEPTIBILITIES TO FOLLOW Performed at [facility], [place] [address], [place], [place] [address]    Report Status PENDING  Incomplete  Blood Culture ID Panel (Reflexed)     Status: Abnormal   Collection Time: 12/13/2022  9:57 AM  Result Value Ref Range Status   Enterococcus faecalis NOT DETECTED NOT DETECTED Final   Enterococcus Faecium NOT DETECTED NOT DETECTED Final   Listeria monocytogenes NOT DETECTED NOT DETECTED  Final   Staphylococcus species NOT DETECTED NOT DETECTED Final   Staphylococcus aureus (BCID) NOT DETECTED NOT DETECTED Final   Staphylococcus epidermidis NOT DETECTED NOT DETECTED Final   Staphylococcus lugdunensis NOT DETECTED NOT DETECTED Final   Streptococcus species DETECTED (A) NOT DETECTED Final    Comment: CRITICAL RESULT CALLED TO, READ BACK BY AND VERIFIED WITH: NATHAN BELUE PHARMD @0321  12/21/22 ASW    Streptococcus agalactiae NOT DETECTED NOT DETECTED Final   Streptococcus pneumoniae DETECTED (A) NOT DETECTED Final    Comment: CRITICAL RESULT CALLED TO, READ BACK BY AND VERIFIED WITH: NATHAN BELUE PHARMD @0321  12/21/22 ASW    Streptococcus pyogenes NOT DETECTED NOT DETECTED Final   A.calcoaceticus-baumannii NOT DETECTED NOT DETECTED Final   Bacteroides fragilis NOT DETECTED NOT DETECTED Final   Enterobacterales NOT DETECTED NOT DETECTED Final   Enterobacter cloacae complex NOT DETECTED NOT DETECTED Final   Escherichia coli NOT DETECTED NOT DETECTED Final   Klebsiella aerogenes NOT DETECTED NOT DETECTED Final   Klebsiella oxytoca NOT DETECTED NOT DETECTED Final   Klebsiella pneumoniae NOT DETECTED NOT DETECTED Final   Proteus species NOT DETECTED NOT DETECTED Final   Salmonella species NOT DETECTED NOT DETECTED Final    Serratia marcescens NOT DETECTED NOT DETECTED Final   Haemophilus influenzae NOT DETECTED NOT DETECTED Final   Neisseria meningitidis NOT DETECTED NOT DETECTED Final   Pseudomonas aeruginosa NOT DETECTED NOT DETECTED Final   Stenotrophomonas maltophilia NOT DETECTED NOT DETECTED Final   Candida albicans NOT DETECTED NOT DETECTED Final   Candida auris NOT DETECTED NOT DETECTED Final   Candida glabrata NOT DETECTED NOT DETECTED Final   Candida krusei NOT DETECTED NOT DETECTED Final   Candida parapsilosis NOT DETECTED NOT DETECTED Final   Candida tropicalis NOT DETECTED NOT DETECTED Final   Cryptococcus neoformans/gattii NOT DETECTED NOT DETECTED Final    Comment: Performed at [facility], [place], [place] [address]  Resp panel by RT-PCR (RSV, Flu A&B, Covid) Anterior Nasal Swab     Status: None   Collection Time: 12/24/2022 10:00 AM   Specimen: Anterior Nasal Swab  Result Value Ref Range Status   SARS Coronavirus 2 by RT PCR NEGATIVE NEGATIVE Final    Comment: (NOTE) SARS-CoV-2 target nucleic acids are NOT DETECTED.  The SARS-CoV-2 RNA is generally detectable in upper respiratory specimens during the acute phase of infection. The lowest concentration of SARS-CoV-2 viral copies this assay can detect is 138 copies/mL. A negative result does not preclude SARS-Cov-2 infection and should not be used as the sole basis for treatment or other patient management decisions. A negative result may occur with  improper specimen collection/handling, submission of specimen other than nasopharyngeal swab, presence of viral mutation(s) within the areas targeted by this assay, and inadequate number of viral copies(<138 copies/mL). A negative result must be combined with clinical observations, patient history, and epidemiological information. The expected result is Negative.  Fact Sheet for Patients:  EntrepreneurPulse.com.au  Fact Sheet for Healthcare  Providers:  IncredibleEmployment.be  This test is no t yet approved or cleared by the [place] FDA and  has been authorized for detection and/or diagnosis of SARS-CoV-2 by FDA under an Emergency Use Authorization (EUA). This EUA will remain  in effect (meaning this test can be used) for the duration of the COVID-19 declaration under Section 564(b)(1) of the Act, 21 U.S.C.section 360bbb-3(b)(1), unless the authorization is terminated  or revoked sooner.       Influenza A by PCR NEGATIVE NEGATIVE Final  Influenza B by PCR NEGATIVE NEGATIVE Final    Comment: (NOTE) The Xpert Xpress SARS-CoV-2/FLU/RSV plus assay is intended as an aid in the diagnosis of influenza from Nasopharyngeal swab specimens and should not be used as a sole basis for treatment. Nasal washings and aspirates are unacceptable for Xpert Xpress SARS-CoV-2/FLU/RSV testing.  Fact Sheet for Patients: EntrepreneurPulse.com.au  Fact Sheet for Healthcare Providers: IncredibleEmployment.be  This test is not yet approved or cleared by the [place] FDA and has been authorized for detection and/or diagnosis of SARS-CoV-2 by FDA under an Emergency Use Authorization (EUA). This EUA will remain in effect (meaning this test can be used) for the duration of the COVID-19 declaration under Section 564(b)(1) of the Act, 21 U.S.C. section 360bbb-3(b)(1), unless the authorization is terminated or revoked.     Resp Syncytial Virus by PCR NEGATIVE NEGATIVE Final    Comment: (NOTE) Fact Sheet for Patients: EntrepreneurPulse.com.au  Fact Sheet for Healthcare Providers: IncredibleEmployment.be  This test is not yet approved or cleared by the [place] FDA and has been authorized for detection and/or diagnosis of SARS-CoV-2 by FDA under an Emergency Use Authorization (EUA). This EUA will remain in effect (meaning this test can be  used) for the duration of the COVID-19 declaration under Section 564(b)(1) of the Act, 21 U.S.C. section 360bbb-3(b)(1), unless the authorization is terminated or revoked.  Performed at [facility], [address] [place], [place] [address]   MRSA Next Gen by PCR, Nasal     Status: None   Collection Time: 12/03/2022  3:47 PM   Specimen: Nasal Mucosa; Nasal Swab  Result Value Ref Range Status   MRSA by PCR Next Gen NOT DETECTED NOT DETECTED Final    Comment: (NOTE) The GeneXpert MRSA Assay (FDA approved for NASAL specimens only), is one component of a comprehensive MRSA colonization surveillance program. It is not intended to diagnose MRSA infection nor to guide or monitor treatment for MRSA infections. Test performance is not FDA approved in patients less than 75 years old. Performed at [facility], [place], [place] [address]   Respiratory (~20 pathogens) panel by PCR     Status: None   Collection Time: 12/27/2022  3:47 PM   Specimen: Nasopharyngeal Swab; Respiratory  Result Value Ref Range Status   Adenovirus NOT DETECTED NOT DETECTED Final   Coronavirus 229E NOT DETECTED NOT DETECTED Final    Comment: (NOTE) The Coronavirus on the Respiratory Panel, DOES NOT test for the novel  Coronavirus (2019 nCoV)    Coronavirus HKU1 NOT DETECTED NOT DETECTED Final   Coronavirus NL63 NOT DETECTED NOT DETECTED Final   Coronavirus OC43 NOT DETECTED NOT DETECTED Final   Metapneumovirus NOT DETECTED NOT DETECTED Final   Rhinovirus / Enterovirus NOT DETECTED NOT DETECTED Final   Influenza A NOT DETECTED NOT DETECTED Final   Influenza B NOT DETECTED NOT DETECTED Final   Parainfluenza Virus 1 NOT DETECTED NOT DETECTED Final   Parainfluenza Virus 2 NOT DETECTED NOT DETECTED Final   Parainfluenza Virus 3 NOT DETECTED NOT DETECTED Final   Parainfluenza Virus 4 NOT DETECTED NOT DETECTED Final   Respiratory Syncytial Virus NOT DETECTED NOT DETECTED Final    Bordetella pertussis NOT DETECTED NOT DETECTED Final   Bordetella Parapertussis NOT DETECTED NOT DETECTED Final   Chlamydophila pneumoniae NOT DETECTED NOT DETECTED Final   Mycoplasma pneumoniae NOT DETECTED NOT DETECTED Final    Comment: Performed at [facility], [place]. [address], [place],  [address]  C Difficile Quick Screen w PCR reflex     Status: None   Collection Time: 12/21/22  9:00 PM   Specimen: STOOL  Result Value Ref Range Status   C Diff antigen NEGATIVE NEGATIVE Final   C Diff toxin NEGATIVE NEGATIVE Final   C Diff interpretation No C. difficile detected.  Final    Comment: Performed at [facility], [address] [place], [place] [address]    Time spent: 15 minutes  Signed: Ezekiel Slocumb, DO 01/08/23

## 2023-01-02 NOTE — Progress Notes (Signed)
Mr. Ian King was made comfort care by his family, pt was placed on dilaudid gtt and 2L [place], with family at beside.pt was turned every two hours, bed placed in lowest position and all four side rail down , family able to hold patients hands. Family was provided with chairs and their preacher was at bedside with them.At 1309 pt showed asystole on the monitor at 1309. Went to patient room,  Patient in bed,wife, daughter, son and family at bedside. His eyes are closed, no sign of life or any respiratory effort. No Palpable pulse for 5 minutes, no heart or respiratory sounds for 5 minutes.Pupils fixed and dilated, no corneal reflex. Nicole Kindred DO, charge nurse, Asencion Gowda NP for palliative and Hospice notified.  Patients pronounced dead by two RN 12-24-22 @1309 . No concern from from family and their preacher said a prayer for them. Post Mortem per protocol. Pt transferred to the morgue by transport team

## 2023-01-02 DEATH — deceased
